# Patient Record
Sex: Female | Born: 1940 | Race: Black or African American | Hispanic: No | Marital: Married | Smoking: Never smoker
Health system: Southern US, Community
[De-identification: ages and names within clinical notes are randomized; demographics above are authoritative.]

## PROBLEM LIST (undated history)

## (undated) DIAGNOSIS — F419 Anxiety disorder, unspecified: Secondary | ICD-10-CM

## (undated) DIAGNOSIS — T7840XA Allergy, unspecified, initial encounter: Secondary | ICD-10-CM

## (undated) DIAGNOSIS — K589 Irritable bowel syndrome without diarrhea: Secondary | ICD-10-CM

## (undated) DIAGNOSIS — N390 Urinary tract infection, site not specified: Secondary | ICD-10-CM

## (undated) DIAGNOSIS — J45909 Unspecified asthma, uncomplicated: Secondary | ICD-10-CM

## (undated) DIAGNOSIS — K219 Gastro-esophageal reflux disease without esophagitis: Secondary | ICD-10-CM

## (undated) DIAGNOSIS — Z8619 Personal history of other infectious and parasitic diseases: Secondary | ICD-10-CM

## (undated) DIAGNOSIS — G43909 Migraine, unspecified, not intractable, without status migrainosus: Secondary | ICD-10-CM

## (undated) HISTORY — DX: Personal history of other infectious and parasitic diseases: Z86.19

## (undated) HISTORY — PX: ABDOMINAL HYSTERECTOMY: SHX81

## (undated) HISTORY — DX: Irritable bowel syndrome, unspecified: K58.9

## (undated) HISTORY — DX: Gastro-esophageal reflux disease without esophagitis: K21.9

## (undated) HISTORY — DX: Allergy, unspecified, initial encounter: T78.40XA

## (undated) HISTORY — DX: Migraine, unspecified, not intractable, without status migrainosus: G43.909

## (undated) HISTORY — DX: Urinary tract infection, site not specified: N39.0

## (undated) HISTORY — DX: Unspecified asthma, uncomplicated: J45.909

## (undated) HISTORY — PX: CATARACT EXTRACTION, BILATERAL: SHX1313

## (undated) HISTORY — PX: TUBAL LIGATION: SHX77

---

## 2006-01-25 ENCOUNTER — Ambulatory Visit: Payer: Self-pay | Admitting: Gastroenterology

## 2006-01-31 ENCOUNTER — Ambulatory Visit: Payer: Self-pay | Admitting: Family Medicine

## 2006-02-07 ENCOUNTER — Ambulatory Visit: Payer: Self-pay | Admitting: Gastroenterology

## 2006-12-24 ENCOUNTER — Emergency Department: Payer: Self-pay | Admitting: Emergency Medicine

## 2006-12-25 ENCOUNTER — Ambulatory Visit: Payer: Self-pay | Admitting: Family Medicine

## 2007-02-05 ENCOUNTER — Ambulatory Visit: Payer: Self-pay | Admitting: Family Medicine

## 2007-03-11 ENCOUNTER — Ambulatory Visit: Payer: Self-pay | Admitting: Gastroenterology

## 2007-03-18 ENCOUNTER — Ambulatory Visit: Payer: Self-pay | Admitting: Gastroenterology

## 2007-03-26 ENCOUNTER — Ambulatory Visit: Payer: Self-pay | Admitting: Gastroenterology

## 2007-06-28 ENCOUNTER — Ambulatory Visit: Payer: Self-pay | Admitting: Obstetrics and Gynecology

## 2007-06-28 ENCOUNTER — Other Ambulatory Visit: Payer: Self-pay

## 2007-07-01 ENCOUNTER — Inpatient Hospital Stay: Payer: Self-pay | Admitting: Obstetrics and Gynecology

## 2008-09-21 ENCOUNTER — Encounter: Admission: RE | Admit: 2008-09-21 | Discharge: 2008-12-20 | Payer: Self-pay | Admitting: Family Medicine

## 2008-10-06 ENCOUNTER — Ambulatory Visit: Payer: Self-pay | Admitting: Ophthalmology

## 2008-10-19 ENCOUNTER — Ambulatory Visit: Payer: Self-pay | Admitting: Ophthalmology

## 2009-02-08 ENCOUNTER — Ambulatory Visit: Payer: Self-pay | Admitting: Family Medicine

## 2009-08-04 ENCOUNTER — Ambulatory Visit: Payer: Self-pay | Admitting: Cardiology

## 2009-08-05 ENCOUNTER — Ambulatory Visit: Payer: Self-pay | Admitting: Cardiology

## 2010-11-10 ENCOUNTER — Emergency Department: Payer: Self-pay | Admitting: Emergency Medicine

## 2011-01-16 ENCOUNTER — Ambulatory Visit: Payer: Self-pay | Admitting: Family Medicine

## 2011-11-29 ENCOUNTER — Ambulatory Visit: Payer: Self-pay | Admitting: Ophthalmology

## 2011-12-11 ENCOUNTER — Ambulatory Visit: Payer: Self-pay | Admitting: Ophthalmology

## 2012-02-28 ENCOUNTER — Ambulatory Visit: Payer: Self-pay | Admitting: Family Medicine

## 2013-03-10 ENCOUNTER — Ambulatory Visit: Payer: Self-pay | Admitting: Family Medicine

## 2014-03-30 ENCOUNTER — Ambulatory Visit: Payer: Self-pay | Admitting: Family Medicine

## 2014-05-12 ENCOUNTER — Ambulatory Visit: Payer: Self-pay | Admitting: General Surgery

## 2014-08-10 ENCOUNTER — Ambulatory Visit: Payer: Self-pay | Admitting: Unknown Physician Specialty

## 2014-08-13 ENCOUNTER — Ambulatory Visit (INDEPENDENT_AMBULATORY_CARE_PROVIDER_SITE_OTHER): Payer: Medicare Other | Admitting: Family Medicine

## 2014-08-13 ENCOUNTER — Encounter: Payer: Self-pay | Admitting: Family Medicine

## 2014-08-13 VITALS — BP 132/76 | HR 58 | Temp 98.4°F | Ht 59.0 in | Wt 125.0 lb

## 2014-08-13 DIAGNOSIS — J45909 Unspecified asthma, uncomplicated: Secondary | ICD-10-CM | POA: Insufficient documentation

## 2014-08-13 DIAGNOSIS — Z800 Family history of malignant neoplasm of digestive organs: Secondary | ICD-10-CM

## 2014-08-13 DIAGNOSIS — J4530 Mild persistent asthma, uncomplicated: Secondary | ICD-10-CM

## 2014-08-13 DIAGNOSIS — Z8049 Family history of malignant neoplasm of other genital organs: Secondary | ICD-10-CM

## 2014-08-13 DIAGNOSIS — K589 Irritable bowel syndrome without diarrhea: Secondary | ICD-10-CM

## 2014-08-13 DIAGNOSIS — J302 Other seasonal allergic rhinitis: Secondary | ICD-10-CM | POA: Insufficient documentation

## 2014-08-13 DIAGNOSIS — R490 Dysphonia: Secondary | ICD-10-CM

## 2014-08-13 DIAGNOSIS — Z803 Family history of malignant neoplasm of breast: Secondary | ICD-10-CM

## 2014-08-13 DIAGNOSIS — Z8669 Personal history of other diseases of the nervous system and sense organs: Secondary | ICD-10-CM

## 2014-08-13 DIAGNOSIS — K581 Irritable bowel syndrome with constipation: Secondary | ICD-10-CM

## 2014-08-13 DIAGNOSIS — K219 Gastro-esophageal reflux disease without esophagitis: Secondary | ICD-10-CM

## 2014-08-13 NOTE — Assessment & Plan Note (Signed)
Likely cause of post nasal drip. Consider trial of nasal steroid.

## 2014-08-13 NOTE — Patient Instructions (Addendum)
Schedule medicare wellness with fasting labs prior in 3-4 weeks. Complete prilosec  40 mg daily as instructed by ENT. Avoid reflux triggers. Try to increase fiber in diet such metamucil. INcrease water in diet.   Consi Der trying flonase OTC 2 sprays per nostril daily.

## 2014-08-13 NOTE — Progress Notes (Signed)
Pre visit review using our clinic review tool, if applicable. No additional management support is needed unless otherwise documented below in the visit note. 

## 2014-08-13 NOTE — Progress Notes (Signed)
   Subjective:    Patient ID: Jessica Hardin, female    DOB: 1940-11-08, 74 y.o.   MRN: [id number]  HPI 74 year old female presents to establish care. She was born in [place] She has previously seeing Dr. Lovie Macadamia. Last OV 09/2013. CPX. Last mammo 01/2014 Colonoscopy: nml 2008  Dr. Tiffany Kocher.  Since then saw Dr. Delano Metz, [facility] ? Sigmoidoscopy, nml, last 3 years ago. Due for  Vaccines.  GERD: moderate control on prilosec 40 mg daily x 10 days Had nml endoscopy  2008 as well.   IBS constipation predominant. She is treating with pulverized cholrella ? Probipotic. Bm firm pellets lately.  Drinks a lot of water. Stomach is sensitive to fiber. Using cumin, tumeric, avoiding gluten.   BP Readings from Last 3 Encounters:  08/13/14 132/76   She has had some recent issues with  Phlegm in throat and hoarseness in last few months.  Has constant post nasal drip in last 4-5 weeks. Coughing  fits dry, some shortness of breath at that time. Hx of childhood asthma. Seen at [facility] on 1/29.. Treat Augmentin x 10 days. Coughing now improved but still with post nasal drip.  Saw ENT 3 days ago..saw narrowing in throat.Durene Cal for CT scan of neck.Marland Kitchen Awaiting the results. Using nasal saline irrigation. Has not tried flonase or nasonex.  Review of Systems  Constitutional: Negative for fever and fatigue.  HENT: Positive for postnasal drip and voice change. Negative for ear pain, sinus pressure and trouble swallowing.   Eyes: Negative for pain.  Respiratory: Negative for chest tightness and shortness of breath.   Cardiovascular: Negative for chest pain, palpitations and leg swelling.  Gastrointestinal: Negative for abdominal pain.  Genitourinary: Negative for dysuria.       Objective:   Physical Exam  Constitutional: Vital signs are normal. She appears well-developed and well-nourished. She is cooperative.  Non-toxic appearance. She does not appear ill. No distress.  HENT:    Head: Normocephalic.  Right Ear: Hearing, tympanic membrane, external ear and ear canal normal.  Left Ear: Hearing, tympanic membrane, external ear and ear canal normal.  Nose: Mucosal edema present. No rhinorrhea.  Eyes: Conjunctivae, EOM and lids are normal. Pupils are equal, round, and reactive to light. Lids are everted and swept, no foreign bodies found.  Neck: Trachea normal and normal range of motion. Neck supple. Carotid bruit is not present. No thyroid mass and no thyromegaly present.  Cardiovascular: Normal rate, regular rhythm, S1 normal, S2 normal, normal heart sounds and intact distal pulses.  Exam reveals no gallop.   No murmur heard. Pulmonary/Chest: Effort normal and breath sounds normal. No respiratory distress. She has no wheezes. She has no rhonchi. She has no rales.  Abdominal: Soft. Normal appearance and bowel sounds are normal. She exhibits no distension, no fluid wave, no abdominal bruit and no mass. There is no hepatosplenomegaly. There is no tenderness. There is no rebound, no guarding and no CVA tenderness. No hernia.  Lymphadenopathy:    She has no cervical adenopathy.    She has no axillary adenopathy.  Neurological: She is alert. She has normal strength. No cranial nerve deficit or sensory deficit.  Skin: Skin is warm, dry and intact. No rash noted.  Psychiatric: Her speech is normal and behavior is normal. Judgment normal. Her mood appears not anxious. Cognition and memory are normal. She does not exhibit a depressed mood.          Assessment & Plan:

## 2014-08-13 NOTE — Assessment & Plan Note (Signed)
In eval by ENT. Will await CT neck.  May also be due to GERD.

## 2014-08-13 NOTE — Assessment & Plan Note (Signed)
Improved with 40 mg daily of prilosec.

## 2014-09-01 ENCOUNTER — Other Ambulatory Visit (INDEPENDENT_AMBULATORY_CARE_PROVIDER_SITE_OTHER): Payer: Medicare Other

## 2014-09-01 ENCOUNTER — Telehealth: Payer: Self-pay | Admitting: Family Medicine

## 2014-09-01 DIAGNOSIS — Z13220 Encounter for screening for lipoid disorders: Secondary | ICD-10-CM

## 2014-09-01 LAB — COMPREHENSIVE METABOLIC PANEL
ALBUMIN: 4 g/dL (ref 3.5–5.2)
ALK PHOS: 73 U/L (ref 39–117)
ALT: 20 U/L (ref 0–35)
AST: 24 U/L (ref 0–37)
BUN: 14 mg/dL (ref 6–23)
CALCIUM: 9.9 mg/dL (ref 8.4–10.5)
CHLORIDE: 103 meq/L (ref 96–112)
CO2: 35 mEq/L — ABNORMAL HIGH (ref 19–32)
Creatinine, Ser: 0.91 mg/dL (ref 0.40–1.20)
GFR: 77.51 mL/min (ref >60.00)
Glucose, Bld: 82 mg/dL (ref 70–99)
POTASSIUM: 4 meq/L (ref 3.5–5.1)
SODIUM: 141 meq/L (ref 135–145)
TOTAL PROTEIN: 7.2 g/dL (ref 6.0–8.3)
Total Bilirubin: 0.4 mg/dL (ref 0.2–1.2)

## 2014-09-01 LAB — LIPID PANEL
CHOLESTEROL: 181 mg/dL (ref 0–200)
HDL: 71.5 mg/dL (ref >39.00)
LDL Cholesterol: 98 mg/dL (ref 0–99)
NonHDL: 109.5
Total CHOL/HDL Ratio: 3
Triglycerides: 56 mg/dL (ref 0.0–149.0)
VLDL: 11.2 mg/dL (ref 0.0–40.0)

## 2014-09-01 NOTE — Telephone Encounter (Signed)
-----   Message from Ellamae Sia sent at 08/26/2014  4:24 PM EST ----- Regarding: Lab orders for Tuesday, 3.15.16 Patient is scheduled for CPX labs, please order future labs, Thanks , Karna Christmas

## 2014-09-08 ENCOUNTER — Ambulatory Visit (INDEPENDENT_AMBULATORY_CARE_PROVIDER_SITE_OTHER): Payer: Medicare Other | Admitting: Family Medicine

## 2014-09-08 ENCOUNTER — Encounter: Payer: Self-pay | Admitting: Family Medicine

## 2014-09-08 VITALS — BP 116/80 | HR 68 | Temp 98.4°F | Ht 59.0 in | Wt 124.5 lb

## 2014-09-08 DIAGNOSIS — Z7189 Other specified counseling: Secondary | ICD-10-CM

## 2014-09-08 DIAGNOSIS — Z0000 Encounter for general adult medical examination without abnormal findings: Secondary | ICD-10-CM | POA: Diagnosis not present

## 2014-09-08 DIAGNOSIS — K219 Gastro-esophageal reflux disease without esophagitis: Secondary | ICD-10-CM

## 2014-09-08 DIAGNOSIS — R490 Dysphonia: Secondary | ICD-10-CM

## 2014-09-08 NOTE — Assessment & Plan Note (Signed)
Stable control on prilosec 40 mg daily, may need to change to nexium or pantoprazole if hoarseness not improving

## 2014-09-08 NOTE — Addendum Note (Signed)
Addended by: Eliezer Lofts E on: 09/08/2014 02:31 PM   Modules accepted: Level of Service, SmartSet

## 2014-09-08 NOTE — Patient Instructions (Addendum)
Continue flonase. Can also try (ceterizine) zyrtec at night for allergies that may be causing mucus in throat. Remove allergens. Continue omeprazole for now. Consider #1 pneumonia vaccine, shingles and tetanus. Consider bone density when doing mammogram next year.

## 2014-09-08 NOTE — Progress Notes (Signed)
Subjective:    Patient ID: Jessica Hardin, female    DOB: [birth year], 74 y.o.   MRN: [id number]  HPI  I have personally reviewed the Medicare Annual Wellness questionnaire and have noted 1. The patient's medical and social history 2. Their use of alcohol, tobacco or illicit drugs 3. Their current medications and supplements 4. The patient's functional ability including ADL's, fall risks, home safety risks and hearing or visual             impairment. 5. Diet and physical activities 6. Evidence for depression or mood disorders The patients weight, height, BMI and visual acuity have been recorded in the chart I have made referrals, counseling and provided education to the patient based review of the above and I have provided the pt with a written personalized care plan for preventive services.  Hoarseness, phlegm in thoart:  CT neck was normal. On flonase 2 sprays per nostril daily. On 3-4 days. She does have air freshener in house she thinks may be contributing.  GERD:on prilosec 40 mg daily on x 4 weeks. No improvement in hoarseness, but no longer having cough.  She thinks she may have fallen once in last year.    Review of Systems  Constitutional: Negative for fever, fatigue and unexpected weight change.  HENT: Negative for congestion, ear pain, sinus pressure, sneezing, sore throat and trouble swallowing.   Eyes: Negative for pain and itching.  Respiratory: Negative for cough, shortness of breath and wheezing.   Cardiovascular: Negative for chest pain, palpitations and leg swelling.  Gastrointestinal: Positive for constipation. Negative for nausea, abdominal pain, diarrhea and blood in stool.  Genitourinary: Negative for dysuria, hematuria and difficulty urinating.  Skin: Negative for rash.  Neurological: Positive for dizziness. Negative for syncope, weakness, numbness and headaches.  Psychiatric/Behavioral: Negative for confusion and dysphoric mood. The patient is not  nervous/anxious.        Objective:   Physical Exam  Constitutional: Vital signs are normal. She appears well-developed and well-nourished. She is cooperative.  Non-toxic appearance. She does not appear ill. No distress.  HENT:  Head: Normocephalic.  Right Ear: Hearing, tympanic membrane, external ear and ear canal normal.  Left Ear: Hearing, tympanic membrane, external ear and ear canal normal.  Nose: Nose normal.  Eyes: Conjunctivae, EOM and lids are normal. Pupils are equal, round, and reactive to light. Lids are everted and swept, no foreign bodies found.  Neck: Trachea normal and normal range of motion. Neck supple. Carotid bruit is not present. No thyroid mass and no thyromegaly present.  Cardiovascular: Normal rate, regular rhythm, S1 normal, S2 normal, normal heart sounds and intact distal pulses.  Exam reveals no gallop.   No murmur heard. Pulmonary/Chest: Effort normal and breath sounds normal. No respiratory distress. She has no wheezes. She has no rhonchi. She has no rales.  Abdominal: Soft. Normal appearance and bowel sounds are normal. She exhibits no distension, no fluid wave, no abdominal bruit and no mass. There is no hepatosplenomegaly. There is no tenderness. There is no rebound, no guarding and no CVA tenderness. No hernia.  Genitourinary: No breast swelling, tenderness, discharge or bleeding. Pelvic exam was performed with patient supine.  Lymphadenopathy:    She has no cervical adenopathy.    She has no axillary adenopathy.  Neurological: She is alert. She has normal strength. No cranial nerve deficit or sensory deficit.  Skin: Skin is warm, dry and intact. No rash noted.  Psychiatric: Her speech is normal and behavior  is normal. Judgment normal. Her mood appears not anxious. Cognition and memory are normal. She does not exhibit a depressed mood.          Assessment & Plan:  The patient's preventative maintenance and recommended screening tests for an annual  wellness exam were reviewed in full today. Brought up to date unless services declined.  Counselled on the importance of diet, exercise, and its role in overall health and mortality. The patient's FH and SH was reviewed, including their home life, tobacco status, and drug and alcohol status.   She has previously seeing Dr. Lovie Macadamia. Last OV 09/2013. CPX. Last mammo 01/2014 Colonoscopy: nml 2008 Dr. Tiffany Kocher. Since then saw Dr. Delano Metz, [facility] ? Sigmoidoscopy, nml, last 3 years ago.  Vaccines. Due flu, shingles,PNA, tdap PAP/DVE: not indicated DXA: Due. Nonsmoker

## 2014-09-08 NOTE — Progress Notes (Signed)
Pre visit review using our clinic review tool, if applicable. No additional management support is needed unless otherwise documented below in the visit note. 

## 2014-09-08 NOTE — Assessment & Plan Note (Signed)
?   Most likely due to allergies or GERD. Per pt feels GERD is well treated. Will add cetrizine or zyrtec to treat in addition to flonase.  Has had ENT eval.

## 2014-10-11 NOTE — Op Note (Signed)
PATIENT NAME:  Jessica Hardin, Jessica Hardin MR#:  [id number] DATE OF BIRTH:  07/20/1940  DATE OF PROCEDURE:  12/11/2011  PREOPERATIVE DIAGNOSIS:  Cataract, left eye.    POSTOPERATIVE DIAGNOSIS:  Cataract, left eye.  PROCEDURE PERFORMED:  Extracapsular cataract extraction using phacoemulsification with placement of an Alcon [id number], 20.5-diopter posterior chamber lens, serial #[id number].  SURGEON:  Loura Back. Charyl Minervini, MD  ASSISTANT:  None.  ANESTHESIA:  4% lidocaine and 0.75% Marcaine in a 50/50 mixture with 10 units/mL of Hylenex added, given as a peribulbar.  ANESTHESIOLOGIST:  Elyse Hsu, MD   COMPLICATIONS:  None.  ESTIMATED BLOOD LOSS:  Less than 1 mL.  DESCRIPTION OF PROCEDURE:  The patient was brought to the operating room and given a peribulbar block.  The patient was then prepped and draped in the usual fashion.  The vertical rectus muscles were imbricated using 5-0 silk sutures.  These sutures were then clamped to the sterile drapes as bridle sutures.  A limbal peritomy was performed extending two clock hours and hemostasis was obtained with cautery.  A partial thickness scleral groove was made at the surgical limbus and dissected anteriorly in a lamellar dissection using an Alcon crescent knife.  The anterior chamber was entered supero-temporally with a Superblade and through the lamellar dissection with a 2.6 mm keratome.  DisCoVisc was used to replace the aqueous and a continuous tear capsulorrhexis was carried out.  Hydrodissection and hydrodelineation were carried out with balanced salt and a 27 gauge canula.  The nucleus was rotated to confirm the effectiveness of the hydrodissection.  Phacoemulsification was carried out using a divide-and-conquer technique.  Total ultrasound time was 1 minute and 9.3 seconds, with an average power of 16.7 percent, CDE of 19.98.  Irrigation/aspiration was used to remove the residual cortex.  DisCoVisc was used to inflate the capsule and the internal  incision was enlarged to 3 mm with the crescent knife.  The intraocular lens was folded and inserted into the capsular bag using the AcrySert Delivery System.   Irrigation/aspiration was used to remove the residual DisCoVisc.  Miostat was injected into the anterior chamber through the paracentesis track to inflate the anterior chamber and induce miosis.  The wound was checked for leaks and none were found. The conjunctiva was closed with cautery and the bridle sutures were removed.  Two drops of 0.3% Vigamox were placed on the eye.   An eye shield was placed on the eye.  The patient was discharged to the recovery room in good condition.   ____________________________ Loura Back Terren Jandreau, MD sad:cbb D: 12/11/2011 14:28:27 ET T: 12/11/2011 15:17:29 ET JOB#: [id number]  cc: Remo Lipps A. Abriel Hattery, MD, <Dictator> Martie Lee MD ELECTRONICALLY SIGNED 12/27/2011 11:56

## 2014-10-12 ENCOUNTER — Telehealth: Payer: Self-pay

## 2014-10-12 NOTE — Telephone Encounter (Signed)
Pt was seen 09/08/14, hoarseness is no better and pt needs refills on omeprazole and levocetirizine dihydrochloride 5 mg. Or if hoarseness was not better Dr Diona Browner said might change meds to different meds. [facility] [address]. Pt request cb. Pt said it is OK with her to try new meds but pt does not want to try med with a lot of potential side effects.

## 2014-10-13 NOTE — Telephone Encounter (Signed)
Change omeprazole to pantoprazole 40 mg daily. Have her call me ifhoarseness not better in 2 weeks. Okay to refill levoceterizine.

## 2014-10-14 MED ORDER — PANTOPRAZOLE SODIUM 40 MG PO TBEC: 40.0000 mg | DELAYED_RELEASE_TABLET | Freq: Every day | ORAL | Status: DC

## 2014-10-14 MED ORDER — LEVOCETIRIZINE DIHYDROCHLORIDE 5 MG PO TABS: 5.0000 mg | ORAL_TABLET | Freq: Every evening | ORAL | Status: DC

## 2014-10-14 NOTE — Telephone Encounter (Signed)
Left message for Jessica Hardin that Dr. Diona Browner wants her to stop omeprazole and start taking pantoprazole 40 mg daily.  Refills also sent in for her levocetirizine to her pharmacy.  Advised to call us back if her hoarseness is not any better in 2 weeks.

## 2015-01-05 ENCOUNTER — Ambulatory Visit (INDEPENDENT_AMBULATORY_CARE_PROVIDER_SITE_OTHER): Payer: Medicare Other | Admitting: Family Medicine

## 2015-01-05 ENCOUNTER — Encounter: Payer: Self-pay | Admitting: Family Medicine

## 2015-01-05 VITALS — BP 110/70 | HR 60 | Temp 98.4°F | Ht 59.0 in | Wt 124.2 lb

## 2015-01-05 DIAGNOSIS — M2660 Temporomandibular joint disorder, unspecified: Secondary | ICD-10-CM | POA: Diagnosis not present

## 2015-01-05 DIAGNOSIS — M6740 Ganglion, unspecified site: Secondary | ICD-10-CM

## 2015-01-05 DIAGNOSIS — M26609 Unspecified temporomandibular joint disorder, unspecified side: Secondary | ICD-10-CM | POA: Insufficient documentation

## 2015-01-05 NOTE — Assessment & Plan Note (Signed)
INfo provided. Caher no apin or dysfuction. She will monitor and consider referral to hand specialist if bothering her more or increasing in size.

## 2015-01-05 NOTE — Progress Notes (Signed)
74 year old female with history of GERD, allergies and IBS presents with new onset mass in last year on right dorsal wrist. Size of marble. 6 months ago noted very small additional area medial to lump.  Getting bigger gradually.  Not tender unless moving hand in certain ways.  Not interfering with use of hands.  No numbness, no weakness in hand.  No redness, no warmth.  No discharge.  Left jaw hears clicking off and on in last few months. Sore with opening mouth. Feels like jaw not closing like it is supposed to.  She grinds her teeth at day and night.  Has not seen dentist in a while.  As an aside pt notes memory issue. She feels this may be due to stress.  Review of Systems  Constitutional: Negative for fever, chills and fatigue.  HENT: Negative for ear pain.  Eyes: Negative for pain.   Objective:   Physical Exam  Constitutional: Vital signs are normal. She appears well-developed and well-nourished. She is cooperative. Non-toxic appearance. She does not appear ill. No distress.  HENT:  Head: Normocephalic.  Right Ear: Hearing, tympanic membrane, external ear and ear canal normal. Tympanic membrane is not erythematous, not retracted and not bulging.  Left Ear: Hearing, tympanic membrane, external ear and ear canal normal. Tympanic membrane is not erythematous, not retracted and not bulging.  Nose: No mucosal edema or rhinorrhea. Right sinus exhibits no maxillary sinus tenderness and no frontal sinus tenderness. Left sinus exhibits no maxillary sinus tenderness and no frontal sinus tenderness.  Mouth/Throat: Uvula is midline, oropharynx is clear and moist and mucous membranes are normal.  Popping with opening mouth wide, mild soreness in left TMJ  Eyes: Conjunctivae, EOM and lids are normal. Pupils are equal, round, and reactive to light. Lids are everted and swept, no foreign bodies found.  Neck: Trachea normal and normal range of motion. Neck supple. Carotid bruit is not present. No  thyroid mass and no thyromegaly present.  Cardiovascular: Normal rate, regular rhythm, S1 normal, S2 normal, normal heart sounds, intact distal pulses and normal pulses. Exam reveals no gallop and no friction rub.  No murmur heard.  Pulmonary/Chest: Effort normal and breath sounds normal. No tachypnea. No respiratory distress. She has no decreased breath sounds. She has no wheezes. She has no rhonchi. She has no rales.  Abdominal: Soft. Normal appearance and bowel sounds are normal. There is no tenderness.  Musculoskeletal:  marble size mobile mass in right posterior wrist consistent with ganglion cyst.  Neurological: She is alert.  Skin: Skin is warm, dry and intact. No rash noted.  Psychiatric: Her speech is normal and behavior is normal. Judgment and thought content normal. Her mood appears not anxious. Cognition and memory are normal. She does not exhibit a depressed mood.   Assessment & Plan:

## 2015-01-05 NOTE — Progress Notes (Signed)
Pre visit review using our clinic review tool, if applicable. No additional management support is needed unless otherwise documented below in the visit note. 

## 2015-01-05 NOTE — Patient Instructions (Addendum)
Call if  ganglion cyst bothering you , or becomes red, hot, painful.. for referral to [facility] surgeon for removal.  See dentist about evaluation of grinding of teeth.  Avoid chewy foods, gum, and opening mouth really wide.  Schedule appt in next months  for memory evaluation if remaining concerned.   Ganglion Cyst A ganglion cyst is a noncancerous, fluid-filled lump that occurs near joints or tendons. The ganglion cyst grows out of a joint or the lining of a tendon. It most often develops in the hand or wrist but can also develop in the shoulder, elbow, hip, knee, ankle, or foot. The round or oval ganglion can be pea sized or larger than a grape. Increased activity may enlarge the size of the cyst because more fluid starts to build up.  CAUSES  It is not completely known what causes a ganglion cyst to grow. However, it may be related to:  Inflammation or irritation around the joint.  An injury.  Repetitive movements or overuse.  Arthritis. SYMPTOMS  A lump most often appears in the hand or wrist, but can occur in other areas of the body. Generally, the lump is painless without other symptoms. However, sometimes pain can be felt during activity or when pressure is applied to the lump. The lump may even be tender to the touch. Tingling, pain, numbness, or muscle weakness can occur if the ganglion cyst presses on a nerve. Your grip may be weak and you may have less movement in your joints.  DIAGNOSIS  Ganglion cysts are most often diagnosed based on a physical exam, noting where the cyst is and how it looks. Your caregiver will feel the lump and may shine a light alongside it. If it is a ganglion, a light often shines through it. Your caregiver may order an X-ray, ultrasound, or MRI to rule out other conditions. TREATMENT  Ganglions usually go away on their own without treatment. If pain or other symptoms are involved, treatment may be needed. Treatment is also needed if the ganglion limits your  movement or if it gets infected. Treatment options include:  Wearing a wrist or finger brace or splint.  Taking anti-inflammatory medicine.  Draining fluid from the lump with a needle (aspiration).  Injecting a steroid into the joint.  Surgery to remove the ganglion cyst and its stalk that is attached to the joint or tendon. However, ganglion cysts can grow back. HOME CARE INSTRUCTIONS   Do not press on the ganglion, poke it with a needle, or hit it with a heavy object. You may rub the lump gently and often. Sometimes fluid moves out of the cyst.  Only take medicines as directed by your caregiver.  Wear your brace or splint as directed by your caregiver. SEEK MEDICAL CARE IF:   Your ganglion becomes larger or more painful.  You have increased redness, red streaks, or swelling.  You have pus coming from the lump.  You have weakness or numbness in the affected area. MAKE SURE YOU:   Understand these instructions.  Will watch your condition.  Will get help right away if you are not doing well or get worse. Document Released: 06/02/2000 Document Revised: 02/28/2012 Document Reviewed: 07/30/2007 [facility] Patient Information 2015 [place], [place]. This information is not intended to replace advice given to you by your health care provider. Make sure you discuss any questions you have with your health care provider.   Temporomandibular Problems  Temporomandibular joint (TMJ) dysfunction means there are problems with the joint  between your jaw and your skull. This is a joint lined by cartilage like other joints in your body but also has a small disc in the joint which keeps the bones from rubbing on each other. These joints are like other joints and can get inflamed (sore) from arthritis and other problems. When this joint gets sore, it can cause headaches and pain in the jaw and the face. CAUSES  Usually the arthritic types of problems are caused by soreness in the joint. Soreness in  the joint can also be caused by overuse. This may come from grinding your teeth. It may also come from mis-alignment in the joint. DIAGNOSIS Diagnosis of this condition can often be made by history and exam. Sometimes your caregiver may need X-rays or an MRI scan to determine the exact cause. It may be necessary to see your dentist to determine if your teeth and jaws are lined up correctly. TREATMENT  Most of the time this problem is not serious; however, sometimes it can persist (become chronic). When this happens medications that will cut down on inflammation (soreness) help. Sometimes a shot of cortisone into the joint will be helpful. If your teeth are not aligned it may help for your dentist to make a splint for your mouth that can help this problem. If no physical problems can be found, the problem may come from tension. If tension is found to be the cause, biofeedback or relaxation techniques may be helpful. HOME CARE INSTRUCTIONS   Later in the day, applications of ice packs may be helpful. Ice can be used in a plastic bag with a towel around it to prevent frostbite to skin. This may be used about every 2 hours for 20 to 30 minutes, as needed while awake, or as directed by your caregiver.  Only take over-the-counter or prescription medicines for pain, discomfort, or fever as directed by your caregiver.  If physical therapy was prescribed, follow your caregiver's directions.  Wear mouth appliances as directed if they were given. Document Released: 02/28/2001 Document Revised: 08/28/2011 Document Reviewed: 06/07/2008 [facility] Patient Information 2015 [place], [place]. This information is not intended to replace advice given to you by your health care provider. Make sure you discuss any questions you have with your health care provider.

## 2015-01-05 NOTE — Progress Notes (Signed)
   Subjective:    Patient ID: Jessica Hardin, female    DOB: 1940-10-13, 74 y.o.   MRN: [id number]  HPI   74 year old female with history of GERD, allergies and IBS presents with new onset mass in last year on right dorsal wrist. Size of marble. 6 months ago noted very small additional area medial to lump.   Getting bigger gradually.  Not tender unless moving hand in certain ways.  Not interfering with use of hands.  No numbness, no weakness in hand.  No redness, no warmth.   No discharge.  Left jaw hears clicking off and on in last few months. Sore with opening mouth.  Feels like jaw not closing like it is supposed to.  She grinds her teeth at day and night. Has not seen dentist in a while.    As an aside pt notes memory issue. She feels this may be due to stress. Review of Systems  Constitutional: Negative for fever, chills and fatigue.  HENT: Negative for ear pain.   Eyes: Negative for pain.       Objective:   Physical Exam  Constitutional: Vital signs are normal. She appears well-developed and well-nourished. She is cooperative.  Non-toxic appearance. She does not appear ill. No distress.  HENT:  Head: Normocephalic.  Right Ear: Hearing, tympanic membrane, external ear and ear canal normal. Tympanic membrane is not erythematous, not retracted and not bulging.  Left Ear: Hearing, tympanic membrane, external ear and ear canal normal. Tympanic membrane is not erythematous, not retracted and not bulging.  Nose: No mucosal edema or rhinorrhea. Right sinus exhibits no maxillary sinus tenderness and no frontal sinus tenderness. Left sinus exhibits no maxillary sinus tenderness and no frontal sinus tenderness.  Mouth/Throat: Uvula is midline, oropharynx is clear and moist and mucous membranes are normal.  Popping with opening mouth wide, mild soreness in left TMJ  Eyes: Conjunctivae, EOM and lids are normal. Pupils are equal, round, and reactive to light. Lids are everted and  swept, no foreign bodies found.  Neck: Trachea normal and normal range of motion. Neck supple. Carotid bruit is not present. No thyroid mass and no thyromegaly present.  Cardiovascular: Normal rate, regular rhythm, S1 normal, S2 normal, normal heart sounds, intact distal pulses and normal pulses.  Exam reveals no gallop and no friction rub.   No murmur heard. Pulmonary/Chest: Effort normal and breath sounds normal. No tachypnea. No respiratory distress. She has no decreased breath sounds. She has no wheezes. She has no rhonchi. She has no rales.  Abdominal: Soft. Normal appearance and bowel sounds are normal. There is no tenderness.  Musculoskeletal:  marble size mobile mass in right posterior wrist consistent with ganglion cyst.  Neurological: She is alert.  Skin: Skin is warm, dry and intact. No rash noted.  Psychiatric: Her speech is normal and behavior is normal. Judgment and thought content normal. Her mood appears not anxious. Cognition and memory are normal. She does not exhibit a depressed mood.          Assessment & Plan:

## 2015-02-09 ENCOUNTER — Ambulatory Visit (INDEPENDENT_AMBULATORY_CARE_PROVIDER_SITE_OTHER): Payer: Medicare Other | Admitting: Family Medicine

## 2015-02-09 ENCOUNTER — Encounter: Payer: Self-pay | Admitting: Family Medicine

## 2015-02-09 VITALS — BP 100/60 | HR 59 | Temp 98.2°F | Ht 59.0 in | Wt 124.8 lb

## 2015-02-09 DIAGNOSIS — R413 Other amnesia: Secondary | ICD-10-CM | POA: Diagnosis not present

## 2015-02-09 DIAGNOSIS — R4189 Other symptoms and signs involving cognitive functions and awareness: Secondary | ICD-10-CM | POA: Insufficient documentation

## 2015-02-09 LAB — CBC WITH DIFFERENTIAL/PLATELET
BASOS ABS: 0 10*3/uL (ref 0.0–0.1)
Basophils Relative: 0.5 % (ref 0.0–3.0)
EOS ABS: 0.1 10*3/uL (ref 0.0–0.7)
Eosinophils Relative: 1.4 % (ref 0.0–5.0)
HCT: 40.1 % (ref 36.0–46.0)
HEMOGLOBIN: 12.8 g/dL (ref 12.0–15.0)
Lymphocytes Relative: 48.4 % — ABNORMAL HIGH (ref 12.0–46.0)
Lymphs Abs: 2.1 10*3/uL (ref 0.7–4.0)
MCHC: 32 g/dL (ref 30.0–36.0)
MCV: 90.6 fl (ref 78.0–100.0)
MONO ABS: 0.3 10*3/uL (ref 0.1–1.0)
Monocytes Relative: 7 % (ref 3.0–12.0)
Neutro Abs: 1.9 10*3/uL (ref 1.4–7.7)
Neutrophils Relative %: 42.7 % — ABNORMAL LOW (ref 43.0–77.0)
Platelets: 199 10*3/uL (ref 150.0–400.0)
RBC: 4.43 Mil/uL (ref 3.87–5.11)
RDW: 14.1 % (ref 11.5–15.5)
WBC: 4.4 10*3/uL (ref 4.0–10.5)

## 2015-02-09 LAB — VITAMIN B12: VITAMIN B 12: 772 pg/mL (ref 211–911)

## 2015-02-09 LAB — TSH: TSH: 0.66 u[IU]/mL (ref 0.35–4.50)

## 2015-02-09 NOTE — Progress Notes (Signed)
Pre visit review using our clinic review tool, if applicable. No additional management support is needed unless otherwise documented below in the visit note. 

## 2015-02-09 NOTE — Patient Instructions (Signed)
Stop at lab on way out. Start exercise and memory games etc.

## 2015-02-09 NOTE — Progress Notes (Signed)
   Subjective:    Patient ID: Jessica Hardin, female    DOB: 1941-05-25, 74 y.o.   MRN: [id number]  HPI  74 year old female presents for 1 month follow up on memory.  She states she has noted she has decrease short memory. Cannot find things she puts away. Trouble remembering things in conversation, forgetting appointments.  Memory gradual  decline in last few years.   No family members have noted issues per pt.  Denies depression, some issues with husband and communication.  She report MRI brain in past for memory issues.. Nml except for mild vessel narrowing. Per pt.   Fam hx: mom with dementia.  Review of Systems  Constitutional: Negative for fever and fatigue.  HENT: Negative for ear pain.   Eyes: Negative for pain.  Respiratory: Negative for chest tightness and shortness of breath.   Cardiovascular: Negative for chest pain, palpitations and leg swelling.  Gastrointestinal: Negative for abdominal pain.  Genitourinary: Negative for dysuria.  Neurological: Negative for light-headedness and numbness.  Psychiatric/Behavioral: Negative for behavioral problems and dysphoric mood. The patient is not nervous/anxious.    MMSE: 30/30 Clock drawing: good. Animal recall; 11 in 30 sec.    Objective:   Physical Exam        Assessment & Plan:

## 2015-02-10 LAB — RPR

## 2015-03-05 ENCOUNTER — Encounter: Payer: Self-pay | Admitting: Family Medicine

## 2015-03-05 ENCOUNTER — Ambulatory Visit (INDEPENDENT_AMBULATORY_CARE_PROVIDER_SITE_OTHER): Payer: Medicare Other | Admitting: Family Medicine

## 2015-03-05 VITALS — BP 110/62 | HR 59 | Temp 98.1°F | Ht 59.0 in | Wt 127.0 lb

## 2015-03-05 DIAGNOSIS — F439 Reaction to severe stress, unspecified: Secondary | ICD-10-CM

## 2015-03-05 DIAGNOSIS — R413 Other amnesia: Secondary | ICD-10-CM

## 2015-03-05 DIAGNOSIS — Z638 Other specified problems related to primary support group: Secondary | ICD-10-CM | POA: Diagnosis not present

## 2015-03-05 MED ORDER — PANTOPRAZOLE SODIUM 40 MG PO TBEC: 40.0000 mg | DELAYED_RELEASE_TABLET | Freq: Every day | ORAL | Status: DC

## 2015-03-05 NOTE — Patient Instructions (Addendum)
Stop at front desk to set up  Referral to counselor.  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  If memory issue worsening we can consider neurocognitive testing.

## 2015-03-05 NOTE — Progress Notes (Signed)
Pre visit review using our clinic review tool, if applicable. No additional management support is needed unless otherwise documented below in the visit note. 

## 2015-03-05 NOTE — Progress Notes (Signed)
   Subjective:    Patient ID: Jessica Hardin, female    DOB: 1940/09/18, 74 y.o.   MRN: [id number] i  HPI  74 year old female pt wth mild memory loss presents with concerns about  stress management x last few years, worse in last few months.  She is under a lot of stress. She does have excessive worry. She has trouble finding words to describe but it does sound like some anxiety stays with her through the whole day.  She does feel sadness that is more than she should expect but not often.Marland Kitchen  PHQ9 score 1. She has frequent waking at night. Trouble going back to sleep.  No panic attacks.   No change in memory. Not sure is her memory is an issue or if husband is causing issue. She gets irritated with him.  02/09/2015:  lab memory eval nml.  MMSE: 30/30 Clock drawing: good. Animal recall; 11 in 30 sec.   Review of Systems  Constitutional: Negative for fever and fatigue.  HENT: Negative for ear pain.   Eyes: Negative for pain.  Respiratory: Negative for chest tightness and shortness of breath.   Cardiovascular: Negative for chest pain, palpitations and leg swelling.  Gastrointestinal: Negative for abdominal pain.  Genitourinary: Negative for dysuria.       Objective:   Physical Exam  Constitutional: Vital signs are normal. She appears well-developed and well-nourished. She is cooperative.  Non-toxic appearance. She does not appear ill. No distress.  HENT:  Head: Normocephalic.  Right Ear: Hearing, tympanic membrane, external ear and ear canal normal. Tympanic membrane is not erythematous, not retracted and not bulging.  Left Ear: Hearing, tympanic membrane, external ear and ear canal normal. Tympanic membrane is not erythematous, not retracted and not bulging.  Nose: No mucosal edema or rhinorrhea. Right sinus exhibits no maxillary sinus tenderness and no frontal sinus tenderness. Left sinus exhibits no maxillary sinus tenderness and no frontal sinus tenderness.  Mouth/Throat: Uvula  is midline, oropharynx is clear and moist and mucous membranes are normal.  Eyes: Conjunctivae, EOM and lids are normal. Pupils are equal, round, and reactive to light. Lids are everted and swept, no foreign bodies found.  Neck: Trachea normal and normal range of motion. Neck supple. Carotid bruit is not present. No thyroid mass and no thyromegaly present.  Cardiovascular: Normal rate, regular rhythm, S1 normal, S2 normal, normal heart sounds, intact distal pulses and normal pulses.  Exam reveals no gallop and no friction rub.   No murmur heard. Pulmonary/Chest: Effort normal and breath sounds normal. No tachypnea. No respiratory distress. She has no decreased breath sounds. She has no wheezes. She has no rhonchi. She has no rales.  Abdominal: Soft. Normal appearance and bowel sounds are normal. There is no tenderness.  Neurological: She is alert.  Skin: Skin is warm, dry and intact. No rash noted.  Psychiatric: Her speech is normal and behavior is normal. Judgment and thought content normal. Her mood appears not anxious. Cognition and memory are normal. She does not exhibit a depressed mood.          Assessment & Plan:

## 2015-03-05 NOTE — Assessment & Plan Note (Signed)
Memory eval was nml in 01/2015 except slightly abnormality in animal recall. ? If mild memory changes due to stress.  Will consider neurocognitive testing  If not improving.

## 2015-03-05 NOTE — Assessment & Plan Note (Signed)
Pt does not clearly seem to qualify for diagnosis of major depression or Generalized anxiety, etc.  She does not seem to manage well with stress.  Will refer to counseling for better stress management techniques.

## 2015-03-16 ENCOUNTER — Ambulatory Visit (INDEPENDENT_AMBULATORY_CARE_PROVIDER_SITE_OTHER): Payer: No Typology Code available for payment source | Admitting: Psychology

## 2015-03-16 DIAGNOSIS — F4323 Adjustment disorder with mixed anxiety and depressed mood: Secondary | ICD-10-CM | POA: Diagnosis not present

## 2015-04-06 ENCOUNTER — Ambulatory Visit (INDEPENDENT_AMBULATORY_CARE_PROVIDER_SITE_OTHER): Payer: No Typology Code available for payment source | Admitting: Psychology

## 2015-04-06 DIAGNOSIS — F4323 Adjustment disorder with mixed anxiety and depressed mood: Secondary | ICD-10-CM | POA: Diagnosis not present

## 2015-04-27 ENCOUNTER — Ambulatory Visit (INDEPENDENT_AMBULATORY_CARE_PROVIDER_SITE_OTHER): Payer: No Typology Code available for payment source | Admitting: Psychology

## 2015-04-27 DIAGNOSIS — F4323 Adjustment disorder with mixed anxiety and depressed mood: Secondary | ICD-10-CM | POA: Diagnosis not present

## 2015-07-19 ENCOUNTER — Telehealth: Payer: Self-pay | Admitting: Family Medicine

## 2015-07-19 NOTE — Telephone Encounter (Signed)
agree

## 2015-07-19 NOTE — Telephone Encounter (Signed)
HAve pt make an appointment to be seen today or tommorow.

## 2015-07-19 NOTE — Telephone Encounter (Signed)
Jessica Hardin is already scheduled for 07/20/2015 at 1:30 pm with Dr. Diona Browner.

## 2015-07-19 NOTE — Telephone Encounter (Signed)
[facility]  Patient Name: Jessica Hardin  DOB: 1940-12-24    Initial Comment caller states she has diarrhea   Nurse Assessment  Nurse: Wayne Sever, RN, Tillie Rung Date/Time (Eastern Time): 07/19/2015 2:17:06 PM  Confirm and document reason for call. If symptomatic, describe symptoms. You must click the next button to save text entered. ---Caller states she has IBS with constipation. Sometimes she does not go for 2 weeks she states. She took Ducolax last night, she then had some vomiting and diarrhea. She states the cramping stopped after all that. She states she saw some something that looked bloody in the toilet, she is not sure if it was blood or urine in the toilet. She is feeling better, but stomach is sore from the cramping per caller. She is drinking fluids and has not seen the blood since.  Has the patient traveled out of the country within the last 30 days? ---Not Applicable  Does the patient have any new or worsening symptoms? ---Yes  Will a triage be completed? ---Yes  Related visit to physician within the last 2 weeks? ---N/A  Does the PT have any chronic conditions? (i.e. diabetes, asthma, etc.) ---Yes  List chronic conditions. ---IBS  Is this a behavioral health or substance abuse call? ---No     Guidelines    Guideline Title Affirmed Question Affirmed Notes  Rectal Bleeding MODERATE rectal bleeding (small blood clots, passing blood without stool, or toilet water turns red)    Final Disposition User   See Physician within 24 Hours Wayne Sever, RN, [employer]    Referrals  REFERRED TO PCP OFFICE   Disagree/Comply: Leta Baptist

## 2015-07-20 ENCOUNTER — Ambulatory Visit (INDEPENDENT_AMBULATORY_CARE_PROVIDER_SITE_OTHER): Payer: PPO | Admitting: Family Medicine

## 2015-07-20 ENCOUNTER — Encounter: Payer: Self-pay | Admitting: Family Medicine

## 2015-07-20 VITALS — BP 110/60 | HR 65 | Temp 98.5°F | Ht 59.0 in | Wt 125.2 lb

## 2015-07-20 DIAGNOSIS — R350 Frequency of micturition: Secondary | ICD-10-CM

## 2015-07-20 DIAGNOSIS — K921 Melena: Secondary | ICD-10-CM

## 2015-07-20 DIAGNOSIS — K648 Other hemorrhoids: Secondary | ICD-10-CM | POA: Insufficient documentation

## 2015-07-20 DIAGNOSIS — K5909 Other constipation: Secondary | ICD-10-CM | POA: Diagnosis not present

## 2015-07-20 LAB — POC URINALSYSI DIPSTICK (AUTOMATED)
BILIRUBIN UA: NEGATIVE
Blood, UA: NEGATIVE
Glucose, UA: NEGATIVE
Ketones, UA: NEGATIVE
LEUKOCYTES UA: NEGATIVE
NITRITE UA: NEGATIVE
PH UA: 6
Protein, UA: NEGATIVE
Spec Grav, UA: 1.015
Urobilinogen, UA: 0.2

## 2015-07-20 NOTE — Assessment & Plan Note (Signed)
Fiber , water, miralax.

## 2015-07-20 NOTE — Progress Notes (Signed)
   Subjective:    Patient ID: Jessica Hardin, female    DOB: 09-02-1940, 75 y.o.   MRN: [id number]  HPI 75 year old female presents with blood in toilet and constipation.  She has been bearing down with stools x 2 weeks  Has been using dandelion, prune and dulcolax for constipation. 2 days ago had abdominal cramp... Had BM, soft stool, cramping resolved. Followed with diarrhea throughout the day yesterday. Early yesterday morning...  Noted yesterday pinkish liquid with stool. She did pee as well.  No blood on toilet tissue.  No rectal pain.  No further pick or red in toilet.   No fever, no N/V.  No burning with urination, mild frequency.  No clear vaginal bleeding.    Social History /Family History/Past Medical History reviewed and updated if needed.  Last colonoscopy: 2008 1 polyp.  Per pt (no record) nml sigmoidoscopy 2013   Review of Systems  Constitutional: Negative for fever and fatigue.  HENT: Negative for ear pain.   Eyes: Negative for pain.  Respiratory: Negative for chest tightness and shortness of breath.   Cardiovascular: Negative for chest pain, palpitations and leg swelling.  Gastrointestinal: Negative for abdominal pain.  Genitourinary: Negative for dysuria.       Objective:   Physical Exam  Constitutional: Vital signs are normal. She appears well-developed and well-nourished. She is cooperative.  Non-toxic appearance. She does not appear ill. No distress.  HENT:  Head: Normocephalic.  Right Ear: Hearing, tympanic membrane, external ear and ear canal normal. Tympanic membrane is not erythematous, not retracted and not bulging.  Left Ear: Hearing, tympanic membrane, external ear and ear canal normal. Tympanic membrane is not erythematous, not retracted and not bulging.  Nose: No mucosal edema or rhinorrhea. Right sinus exhibits no maxillary sinus tenderness and no frontal sinus tenderness. Left sinus exhibits no maxillary sinus tenderness and no frontal sinus  tenderness.  Mouth/Throat: Uvula is midline, oropharynx is clear and moist and mucous membranes are normal.  Eyes: Conjunctivae, EOM and lids are normal. Pupils are equal, round, and reactive to light. Lids are everted and swept, no foreign bodies found.  Neck: Trachea normal and normal range of motion. Neck supple. Carotid bruit is not present. No thyroid mass and no thyromegaly present.  Cardiovascular: Normal rate, regular rhythm, S1 normal, S2 normal, normal heart sounds, intact distal pulses and normal pulses.  Exam reveals no gallop and no friction rub.   No murmur heard. Pulmonary/Chest: Effort normal and breath sounds normal. No tachypnea. No respiratory distress. She has no decreased breath sounds. She has no wheezes. She has no rhonchi. She has no rales.  Abdominal: Soft. Normal appearance and bowel sounds are normal. There is no tenderness.  Neurological: She is alert.  Skin: Skin is warm, dry and intact. No rash noted.  Psychiatric: Her speech is normal and behavior is normal. Judgment and thought content normal. Her mood appears not anxious. Cognition and memory are normal. She does not exhibit a depressed mood.    Anoscopy: No fissure, no mass, nontender, small int hemorrhoids seena nd skin tag at anal opening. no complications, no bleeding.        Assessment & Plan:

## 2015-07-20 NOTE — Progress Notes (Signed)
Pre visit review using our clinic review tool, if applicable. No additional management support is needed unless otherwise documented below in the visit note. 

## 2015-07-20 NOTE — Patient Instructions (Addendum)
Blood in toilet due tpo constipation and irritation of small internal hemorrhoids. Increase fiber and water in diet. Start daily miralax . Call if blood in stool returns or constipation not resolved.  Urine clear.. No blood.

## 2015-07-20 NOTE — Assessment & Plan Note (Signed)
Due to irritation of small internal hemorrhoids.

## 2015-07-20 NOTE — Assessment & Plan Note (Signed)
Mild, not resolved. Liekly due to irritaion for constipation.

## 2015-07-20 NOTE — Addendum Note (Signed)
Addended by: Carter Kitten on: 07/20/2015 02:39 PM   Modules accepted: Orders

## 2015-07-28 DIAGNOSIS — H40003 Preglaucoma, unspecified, bilateral: Secondary | ICD-10-CM | POA: Diagnosis not present

## 2015-08-31 ENCOUNTER — Telehealth: Payer: Self-pay | Admitting: Family Medicine

## 2015-08-31 NOTE — Telephone Encounter (Signed)
Patient Name: Jessica Hardin  DOB: 07-08-1940    Initial Comment Caller states she has nasal congestion and cough   Nurse Assessment  Nurse: Mallie Mussel, RN, Alveta Heimlich Date/Time (Eastern Time): 08/31/2015 10:35:08 AM  Confirm and document reason for call. If symptomatic, describe symptoms. You must click the next button to save text entered. ---Caller states that she has a cough which began last Thursday. She has sinus problems all year. She has a stuffy nose but she does post nasal drip into her throat "all the time." She states has some sinus pressure and pain. She feels like she has had fever, but not at present. Denies redness of skin, and denies swelling.  Has the patient traveled out of the country within the last 30 days? ---No  Does the patient have any new or worsening symptoms? ---Yes  Will a triage be completed? ---Yes  Related visit to physician within the last 2 weeks? ---No  Does the PT have any chronic conditions? (i.e. diabetes, asthma, etc.) ---No  Is this a behavioral health or substance abuse call? ---No     Guidelines    Guideline Title Affirmed Question Affirmed Notes  Sinus Pain or Congestion Lots of coughing    Final Disposition User   See PCP When Office is Open (within 3 days) Mallie Mussel, RN, Alveta Heimlich    Comments  Dr. Diona Browner does not have any appointments available today and will not be in the office tomorrow. Dr. Deborra Medina has an appointment available tomorrow morning at 9:00am. Appointment scheduled.   Referrals  REFERRED TO PCP OFFICE   Disagree/Comply: Comply

## 2015-08-31 NOTE — Telephone Encounter (Signed)
Pt has appt 09/01/15 at 9AM with Dr Deborra Medina.

## 2015-09-01 ENCOUNTER — Ambulatory Visit: Payer: Self-pay | Admitting: Family Medicine

## 2015-09-08 ENCOUNTER — Ambulatory Visit (INDEPENDENT_AMBULATORY_CARE_PROVIDER_SITE_OTHER): Payer: PPO | Admitting: Family Medicine

## 2015-09-08 ENCOUNTER — Encounter: Payer: Self-pay | Admitting: Family Medicine

## 2015-09-08 VITALS — BP 120/68 | HR 62 | Temp 98.7°F | Wt 123.8 lb

## 2015-09-08 DIAGNOSIS — J0100 Acute maxillary sinusitis, unspecified: Secondary | ICD-10-CM

## 2015-09-08 MED ORDER — FLUTICASONE PROPIONATE 50 MCG/ACT NA SUSP: 2.0000 | Freq: Every day | NASAL | Status: DC | PRN

## 2015-09-08 MED ORDER — AMOXICILLIN-POT CLAVULANATE 875-125 MG PO TABS: 1.0000 | ORAL_TABLET | Freq: Two times a day (BID) | ORAL | Status: DC

## 2015-09-08 NOTE — Patient Instructions (Addendum)
Use the neti pot and flonase.  Start the antibiotics in the meantime.  Take care.  Glad to see you.

## 2015-09-08 NOTE — Progress Notes (Signed)
Pre visit review using our clinic review tool, if applicable. No additional management support is needed unless otherwise documented below in the visit note.  Sx started about 2 weeks ago.  Off flonase in the meantime.   Started with sinus drip.  Worse in the meantime.  Post nasal gtt worse at night, disrupting sleep.  Now with discolored rhinorrhea/sputum.   No fevers.  Facial pain noted, R max sinus, more pain leaning over.  No ear pain.   No vomiting.  Had diarrhea initially but resolved now.   H/o constipation at baseline.    Meds, vitals, and allergies reviewed.   ROS: See HPI.  Otherwise, noncontributory.  GEN: nad, alert and oriented HEENT: mucous membranes moist, tm w/o erythema, nasal exam w/o erythema, clear discharge noted,  OP with cobblestoning, R max sinus ttp NECK: supple w/o LA CV: rrr.   PULM: ctab, no inc wob EXT: no edema

## 2015-09-12 DIAGNOSIS — J0100 Acute maxillary sinusitis, unspecified: Secondary | ICD-10-CM | POA: Insufficient documentation

## 2015-09-12 NOTE — Assessment & Plan Note (Signed)
Nontoxic, d/w pt.  neti pot and flonase, augmentin, f/u prn. She agrees.

## 2015-09-13 ENCOUNTER — Telehealth: Payer: Self-pay | Admitting: Family Medicine

## 2015-09-13 NOTE — Telephone Encounter (Signed)
[facility]  Patient Name: Jessica Hardin  DOB: [birth year]    Initial Comment Caller states she has been having side pain that spread to abdomen, has tissue or sediment in darker urine, side pain stopped but abdominal pain is off and on   Nurse Assessment  Nurse: Wayne Sever, RN, Tillie Rung Date/Time (Eastern Time): 09/13/2015 1:26:50 PM  Confirm and document reason for call. If symptomatic, describe symptoms. You must click the next button to save text entered. ---Caller states she had a pain in her left side that spread across her stomach. She states she was having issues urinating, and then it got better. She states she saw sediments in her pee. She states she saw them again this morning. She is wondering if she needs to be concerned. Denies any more pain.  Has the patient traveled out of the country within the last 30 days? ---Not Applicable  Does the patient have any new or worsening symptoms? ---Yes  Will a triage be completed? ---Yes  Related visit to physician within the last 2 weeks? ---No  Does the PT have any chronic conditions? (i.e. diabetes, asthma, etc.) ---Yes  List chronic conditions. ---IBS, Constipation, Sinus Issues  Is this a behavioral health or substance abuse call? ---No     Guidelines    Guideline Title Affirmed Question Affirmed Notes       Final Disposition User        Comments  Appointment scheduled on 03/31 at [facility]

## 2015-09-13 NOTE — Telephone Encounter (Signed)
Pt has appt with Dr Diona Browner on 03/31/17at 10:30.

## 2015-09-17 ENCOUNTER — Ambulatory Visit (INDEPENDENT_AMBULATORY_CARE_PROVIDER_SITE_OTHER): Payer: PPO | Admitting: Family Medicine

## 2015-09-17 ENCOUNTER — Encounter: Payer: Self-pay | Admitting: Family Medicine

## 2015-09-17 VITALS — BP 120/64 | HR 60 | Temp 98.3°F | Ht 59.0 in | Wt 125.0 lb

## 2015-09-17 DIAGNOSIS — R1030 Lower abdominal pain, unspecified: Secondary | ICD-10-CM | POA: Diagnosis not present

## 2015-09-17 LAB — POC URINALSYSI DIPSTICK (AUTOMATED)
Bilirubin, UA: NEGATIVE
Glucose, UA: NEGATIVE
KETONES UA: NEGATIVE
Leukocytes, UA: NEGATIVE
Nitrite, UA: NEGATIVE
PROTEIN UA: NEGATIVE
RBC UA: NEGATIVE
SPEC GRAV UA: 1.02
UROBILINOGEN UA: 0.2
pH, UA: 6.5

## 2015-09-17 NOTE — Patient Instructions (Addendum)
Drink water, high fiber diet.  Call if symptoms return.

## 2015-09-17 NOTE — Assessment & Plan Note (Signed)
Pain was more upper.  resolved now. ? Small stones passed versus gas pain vs constipation.  Increase water and fiber in diet. Info given.

## 2015-09-17 NOTE — Progress Notes (Signed)
   Subjective:    Patient ID: Jessica Hardin, female    DOB: 1940-06-23, 75 y.o.   MRN: [id number]  HPI  75 year old female presents with sudden onset pain in LUQ before a  meal, spread to RUQ resolve in 24 hours. Pain worsened with urination. No dysuria, no frequency or urgency, hematuria.  Pain resolved on its own, but the next day saw sediment in urine, in bottom of toilet.  No N/V/ D/C. No fever. Did have episode of constipation the week prior, resolved with miralax.  Nml po intake, no UOp.   Social History /Family History/Past Medical History reviewed and updated if needed.   Review of Systems  Constitutional: Negative for fever and fatigue.  HENT: Negative for ear pain.   Eyes: Negative for pain.  Respiratory: Negative for chest tightness and shortness of breath.   Cardiovascular: Negative for chest pain, palpitations and leg swelling.  Gastrointestinal: Negative for abdominal pain.  Genitourinary: Negative for dysuria.       Objective:   Physical Exam  Constitutional: Vital signs are normal. She appears well-developed and well-nourished. She is cooperative.  Non-toxic appearance. She does not appear ill. No distress.  HENT:  Head: Normocephalic.  Right Ear: Hearing, tympanic membrane, external ear and ear canal normal. Tympanic membrane is not erythematous, not retracted and not bulging.  Left Ear: Hearing, tympanic membrane, external ear and ear canal normal. Tympanic membrane is not erythematous, not retracted and not bulging.  Nose: No mucosal edema or rhinorrhea. Right sinus exhibits no maxillary sinus tenderness and no frontal sinus tenderness. Left sinus exhibits no maxillary sinus tenderness and no frontal sinus tenderness.  Mouth/Throat: Uvula is midline, oropharynx is clear and moist and mucous membranes are normal.  Eyes: Conjunctivae, EOM and lids are normal. Pupils are equal, round, and reactive to light. Lids are everted and swept, no foreign bodies found.    Neck: Trachea normal and normal range of motion. Neck supple. Carotid bruit is not present. No thyroid mass and no thyromegaly present.  Cardiovascular: Normal rate, regular rhythm, S1 normal, S2 normal, normal heart sounds, intact distal pulses and normal pulses.  Exam reveals no gallop and no friction rub.   No murmur heard. Pulmonary/Chest: Effort normal and breath sounds normal. No tachypnea. No respiratory distress. She has no decreased breath sounds. She has no wheezes. She has no rhonchi. She has no rales.  Abdominal: Soft. Normal appearance and bowel sounds are normal. There is generalized tenderness.  Very mild almost nonexistant pain per pt in abdomen, more like bloating per pt.  Neurological: She is alert.  Skin: Skin is warm, dry and intact. No rash noted.  Psychiatric: Her speech is normal and behavior is normal. Judgment and thought content normal. Her mood appears not anxious. Cognition and memory are normal. She does not exhibit a depressed mood.          Assessment & Plan:

## 2015-09-17 NOTE — Progress Notes (Signed)
Pre visit review using our clinic review tool, if applicable. No additional management support is needed unless otherwise documented below in the visit note. 

## 2015-10-18 ENCOUNTER — Telehealth: Payer: Self-pay | Admitting: Family Medicine

## 2015-10-18 NOTE — Telephone Encounter (Signed)
Pt called and sad that on Saturday morning she had a spot of blood when going to the bathroom.  It has only happened once. She wants to know if she neds an appt? cb number is   [phone] Thanks you

## 2015-10-18 NOTE — Telephone Encounter (Signed)
Pt saw a spot of bright red blood on middle of panty liner on 10/16/15. Pt has not seen any blood since then. No abd or back pain. No fever. No burning or pain or frequency of urine. Pt does not want to schedule appt but wants to know what Dr Diona Browner thinks. Pt request cb. Pt has appt on 11/04/15 for medicare wellness with Candis Musa.[facility] [address].

## 2015-10-18 NOTE — Telephone Encounter (Signed)
Please call and triage patient.  

## 2015-10-18 NOTE — Telephone Encounter (Signed)
As long as no other symptoms, we can eval at upcoming appt. If new symotms or recurrence of blood on underware.. Make appt to be seen sooner.

## 2015-10-19 NOTE — Telephone Encounter (Signed)
Jessica Hardin notified as instructed by telephone.  

## 2015-11-04 ENCOUNTER — Ambulatory Visit (INDEPENDENT_AMBULATORY_CARE_PROVIDER_SITE_OTHER): Payer: PPO

## 2015-11-04 VITALS — BP 118/76 | HR 71 | Temp 98.5°F | Ht 58.5 in | Wt 126.5 lb

## 2015-11-04 DIAGNOSIS — E2839 Other primary ovarian failure: Secondary | ICD-10-CM | POA: Diagnosis not present

## 2015-11-04 DIAGNOSIS — Z0000 Encounter for general adult medical examination without abnormal findings: Secondary | ICD-10-CM

## 2015-11-04 DIAGNOSIS — K219 Gastro-esophageal reflux disease without esophagitis: Secondary | ICD-10-CM | POA: Diagnosis not present

## 2015-11-04 LAB — COMPREHENSIVE METABOLIC PANEL
ALBUMIN: 4 g/dL (ref 3.5–5.2)
ALK PHOS: 62 U/L (ref 39–117)
ALT: 12 U/L (ref 0–35)
AST: 19 U/L (ref 0–37)
BUN: 13 mg/dL (ref 6–23)
CALCIUM: 9.9 mg/dL (ref 8.4–10.5)
CHLORIDE: 105 meq/L (ref 96–112)
CO2: 33 mEq/L — ABNORMAL HIGH (ref 19–32)
CREATININE: 0.8 mg/dL (ref 0.40–1.20)
GFR: 89.65 mL/min (ref >60.00)
Glucose, Bld: 66 mg/dL — ABNORMAL LOW (ref 70–99)
Potassium: 3.5 mEq/L (ref 3.5–5.1)
Sodium: 142 mEq/L (ref 135–145)
TOTAL PROTEIN: 6.9 g/dL (ref 6.0–8.3)
Total Bilirubin: 0.3 mg/dL (ref 0.2–1.2)

## 2015-11-04 NOTE — Progress Notes (Signed)
Subjective:   Jessica Hardin is a 75 y.o. female who presents for Medicare Annual (Subsequent) preventive examination.  Review of Systems:  N/A Cardiac Risk Factors include: advanced age (>69men, >18 women);sedentary lifestyle     Objective:     Vitals: BP 118/76 mmHg  Pulse 71  Temp(Src) 98.5 F (36.9 C) (Oral)  Ht 4' 10.5" (1.486 m)  Wt 126 lb 8 oz (57.38 kg)  BMI 25.99 kg/m2  SpO2 96%  Body mass index is 25.99 kg/(m^2).   Tobacco History  Smoking status  . Never Smoker   Smokeless tobacco  . Never Used     Counseling given: No   Past Medical History  Diagnosis Date  . Asthma   . Allergy   . History of chicken pox   . IBS (irritable bowel syndrome)   . Migraine   . GERD (gastroesophageal reflux disease)   . Urinary tract infection    Past Surgical History  Procedure Laterality Date  . Abdominal hysterectomy    . Tubal ligation    . Cataract extraction, bilateral     Family History  Problem Relation Age of Onset  . Arthritis Mother   . Hypertension Mother   . Arthritis Sister   . Hypertension Sister   . Colon cancer Brother 40  . Breast cancer Sister   . Breast cancer Sister   . Uterine cancer Sister 97   History  Sexual Activity  . Sexual Activity: Yes    Outpatient Encounter Prescriptions as of 11/04/2015  Medication Sig  . DANDELION PO Take by mouth daily.  . fluticasone (FLONASE) 50 MCG/ACT nasal spray Place 2 sprays into both nostrils daily as needed. Reported on 09/08/2015  . levocetirizine (XYZAL) 5 MG tablet Take 1 tablet (5 mg total) by mouth every evening. (Patient taking differently: Take 5 mg by mouth daily as needed. )  . Multiple Vitamin (MULTIVITAMIN) capsule Take 1 capsule by mouth daily.   . pantoprazole (PROTONIX) 40 MG tablet Take 1 tablet (40 mg total) by mouth daily.  . polyethylene glycol (MIRALAX / GLYCOLAX) packet Take 17 g by mouth daily as needed for mild constipation.  . [DISCONTINUED] amoxicillin-clavulanate  (AUGMENTIN) 875-125 MG tablet Take 1 tablet by mouth 2 (two) times daily.   No facility-administered encounter medications on file as of 11/04/2015.    Activities of Daily Living In your present state of health, do you have any difficulty performing the following activities: 11/04/2015  Hearing? N  Vision? N  Difficulty concentrating or making decisions? N  Walking or climbing stairs? N  Dressing or bathing? N  Doing errands, shopping? N  Preparing Food and eating ? N  Using the Toilet? N  In the past six months, have you accidently leaked urine? N  Do you have problems with loss of bowel control? N  Managing your Medications? N  Managing your Finances? N  Housekeeping or managing your Housekeeping? N    Patient Care Team: Jinny Sanders, MD as PCP - General (Family Medicine) Brayton Mars, MD as Referring Physician (Obstetrics and Gynecology) Robert Bellow, MD (General Surgery) Estill Cotta, MD as Referring Physician (Ophthalmology)    Assessment:     Exercise Activities and Dietary recommendations Current Exercise Habits: The patient does not participate in regular exercise at present, Exercise limited by: None identified  Goals    . sleep management     Starting 11/04/2015, I will continue to do deep breathing exercises whenever I feel stressed and  when I unable to sleep at night.       Fall Risk Fall Risk  11/04/2015  Falls in the past year? No   Depression Screen PHQ 2/9 Scores 11/04/2015 03/05/2015 09/08/2014  PHQ - 2 Score 0 1 1     Cognitive Testing MMSE - Mini Mental State Exam 11/04/2015  Orientation to time 5  Orientation to Place 5  Registration 3  Attention/ Calculation 0  Recall 3  Language- name 2 objects 0  Language- repeat 1  Language- follow 3 step command 3  Language- read & follow direction 0  Write a sentence 0  Copy design 0  Total score 20   PLEASE NOTE: A Mini-Cog screen was completed. Maximum score is 20. A value of 0  denotes this part of [place] MMSE was not completed or the patient failed this part of the Mini-Cog screening.   Mini-Cog Screening Orientation to Time - Max 5 pts Orientation to Place - Max 5 pts Registration - Max 3 pts Recall - Max 3 pts Language Repeat - Max 1 pts Language Follow 3 Step Command - Max 3 pts   There is no immunization history on file for this patient. Screening Tests Health Maintenance  Topic Date Due  . DEXA SCAN  03/19/2016 (Originally 09/07/2004)  . ZOSTAVAX  11/03/2016 (Originally 09/08/1999)  . TETANUS/TDAP  11/03/2016 (Originally 09/08/1958)  . PNA vac Low Risk Adult (1 of 2 - PCV13) 11/03/2016 (Originally 09/07/2004)  . INFLUENZA VACCINE  01/18/2016      Plan:     I have personally reviewed and addressed the Medicare Annual Wellness questionnaire and have noted the following in the patient's chart:  A. Medical and social history B. Use of alcohol, tobacco or illicit drugs  C. Current medications and supplements D. Functional ability and status E.  Nutritional status F.  Physical activity G. Advance directives H. List of other physicians I.  Hospitalizations, surgeries, and ER visits in previous 12 months J.  [place] to include hearing, vision, cognitive, depression L. Referrals and appointments - none  In addition, I have reviewed and discussed with patient certain preventive protocols, quality metrics, and best practice recommendations. A written personalized care plan for preventive services as well as general preventive health recommendations were provided to patient.  See attached scanned questionnaire for additional information.   Signed,   Lindell Noe, MHA, BS, LPN Health Advisor

## 2015-11-04 NOTE — Progress Notes (Signed)
I reviewed health advisor's note, was available for consultation, and agree with documentation and plan.  Carney Saxton, MD [facility] [place]  

## 2015-11-04 NOTE — Progress Notes (Signed)
Pre visit review using our clinic review tool, if applicable. No additional management support is needed unless otherwise documented below in the visit note. 

## 2015-11-04 NOTE — Progress Notes (Signed)
PCP notes:  Health maintenance:  PNA vaccine - postponed per pt request  Shingles vaccine - postponed for insurance verification  Tetanus vaccine - postponed for insurance verifcation  Bone density - Scheduled 11/18/15  Abnormal screenings: None  Patient concerns: None  Nurse concerns: None  Next PCP appt: 11/16/15 @ 9AM

## 2015-11-04 NOTE — Patient Instructions (Addendum)
Ms. Quillman , Thank you for taking time to come for your Medicare Wellness Visit. I appreciate your ongoing commitment to your health goals. Please review the following plan we discussed and let me know if I can assist you in the future.   These are the goals we discussed: Goals    . sleep management     Starting 11/04/2015, I will continue to do deep breathing exercises whenever I feel stressed and when I unable to sleep at night.        This is a list of the screening recommended for you and due dates:  Health Maintenance  Topic Date Due  . DEXA scan (bone density measurement)  03/19/2016*  . Shingles Vaccine  11/03/2016*  . Tetanus Vaccine  11/03/2016*  . Pneumonia vaccines (1 of 2 - PCV13) 11/03/2016*  . Flu Shot  01/18/2016  *Topic was postponed. The date shown is not the original due date.    Preventive Care for Adults  A healthy lifestyle and preventive care can promote health and wellness. Preventive health guidelines for adults include the following key practices.  . A routine yearly physical is a good way to check with your health care provider about your health and preventive screening. It is a chance to share any concerns and updates on your health and to receive a thorough exam.  . Visit your dentist for a routine exam and preventive care every 6 months. Brush your teeth twice a day and floss once a day. Good oral hygiene prevents tooth decay and gum disease.  . The frequency of eye exams is based on your age, health, family medical history, use  of contact lenses, and other factors. Follow your health care provider's ecommendations for frequency of eye exams.  . Eat a healthy diet. Foods like vegetables, fruits, whole grains, low-fat dairy products, and lean protein foods contain the nutrients you need without too many calories. Decrease your intake of foods high in solid fats, added sugars, and salt. Eat the right amount of calories for you. Get information about a  proper diet from your health care provider, if necessary.  . Regular physical exercise is one of the most important things you can do for your health. Most adults should get at least 150 minutes of moderate-intensity exercise (any activity that increases your heart rate and causes you to sweat) each week. In addition, most adults need muscle-strengthening exercises on 2 or more days a week.  Silver Sneakers may be a benefit available to you. To determine eligibility, you may visit the website: www.silversneakers.com or contact program at (602)262-5346 Mon-Fri between 8AM-8PM.   . Maintain a healthy weight. The body mass index (BMI) is a screening tool to identify possible weight problems. It provides an estimate of body fat based on height and weight. Your health care provider can find your BMI and can help you achieve or maintain a healthy weight.   For adults 20 years and older: ? A BMI below 18.5 is considered underweight. ? A BMI of 18.5 to 24.9 is normal. ? A BMI of 25 to 29.9 is considered overweight. ? A BMI of 30 and above is considered obese.   . Maintain normal blood lipids and cholesterol levels by exercising and minimizing your intake of saturated fat. Eat a balanced diet with plenty of fruit and vegetables. Blood tests for lipids and cholesterol should begin at age 72 and be repeated every 5 years. If your lipid or cholesterol levels are high, you  are over 35, or you are at high risk for heart disease, you may need your cholesterol levels checked more frequently. Ongoing high lipid and cholesterol levels should be treated with medicines if diet and exercise are not working.  . If you smoke, find out from your health care provider how to quit. If you do not use tobacco, please do not start.  . If you choose to drink alcohol, please do not consume more than 2 drinks per day. One drink is considered to be 12 ounces (355 mL) of beer, 5 ounces (148 mL) of wine, or 1.5 ounces (44 mL) of  liquor.  . If you are 23-27 years old, ask your health care provider if you should take aspirin to prevent strokes.  . Use sunscreen. Apply sunscreen liberally and repeatedly throughout the day. You should seek shade when your shadow is shorter than you. Protect yourself by wearing long sleeves, pants, a wide-brimmed hat, and sunglasses year round, whenever you are outdoors.  . Once a month, do a whole body skin exam, using a mirror to look at the skin on your back. Tell your health care provider of new moles, moles that have irregular borders, moles that are larger than a pencil eraser, or moles that have changed in shape or color.

## 2015-11-16 ENCOUNTER — Telehealth: Payer: Self-pay

## 2015-11-16 ENCOUNTER — Ambulatory Visit: Payer: Self-pay | Admitting: Family Medicine

## 2015-11-16 DIAGNOSIS — Z0289 Encounter for other administrative examinations: Secondary | ICD-10-CM

## 2015-11-16 NOTE — Telephone Encounter (Signed)
Pt has rescheduled annual exam on 11/22/15 at 8:30 with Dr Diona Browner.

## 2015-11-16 NOTE — Telephone Encounter (Signed)
PLEASE NOTE: All timestamps contained within this report are represented as [place] Standard Time. CONFIDENTIALTY NOTICE: This fax transmission is intended only for the addressee. It contains information that is legally privileged, confidential or otherwise protected from use or disclosure. If you are not the intended recipient, you are strictly prohibited from reviewing, disclosing, copying using or disseminating any of this information or taking any action in reliance on or regarding this information. If you have received this fax in error, please notify us immediately by telephone so that we can arrange for its return to [place]. Phone: [phone], Toll-Free: [phone], Fax: [phone] Page: 1 of 1 Call Id: [id number] [place] [facility] - Client Nonclinical Telephone Record [place] [facility] - Client Client Site [place] - Day Physician Eliezer Lofts - MD Contact Type Call Who Is Calling Patient / Member / Family / Caregiver Caller Name [place] Phone Number [phone] Patient Name Jessica Hardin Call Type Message Only Information Provided Reason for Call Request to Reschedule Office Appointment Initial Comment Caller needs to reschedule appt, declined triage. Call Closed By: Caryl Comes Transaction Date/Time: 11/15/2015 12:30:34 PM (ET)

## 2015-11-18 ENCOUNTER — Ambulatory Visit
Admission: RE | Admit: 2015-11-18 | Discharge: 2015-11-18 | Disposition: A | Discharge status: Home / Self Care | Payer: PPO | Source: Ambulatory Visit | Attending: Family Medicine | Admitting: Family Medicine

## 2015-11-18 ENCOUNTER — Encounter: Payer: Self-pay | Admitting: Family Medicine

## 2015-11-18 DIAGNOSIS — E2839 Other primary ovarian failure: Secondary | ICD-10-CM | POA: Diagnosis not present

## 2015-11-18 DIAGNOSIS — M85851 Other specified disorders of bone density and structure, right thigh: Secondary | ICD-10-CM | POA: Diagnosis not present

## 2015-11-18 DIAGNOSIS — M8588 Other specified disorders of bone density and structure, other site: Secondary | ICD-10-CM | POA: Insufficient documentation

## 2015-11-18 DIAGNOSIS — M8580 Other specified disorders of bone density and structure, unspecified site: Secondary | ICD-10-CM | POA: Insufficient documentation

## 2015-11-22 ENCOUNTER — Ambulatory Visit: Payer: PPO | Admitting: Family Medicine

## 2015-11-22 ENCOUNTER — Telehealth: Payer: Self-pay | Admitting: Family Medicine

## 2015-11-22 NOTE — Telephone Encounter (Signed)
Needs to reschedule CPX.  By the way for me ANY CPX needs to be rescheduled if they miss it.

## 2015-11-22 NOTE — Telephone Encounter (Signed)
Patient did not come for their scheduled appointment today for cpx  Please let me know if the patient needs to be contacted immediately for follow up or if no follow up is necessary.   ° °

## 2015-11-23 NOTE — Telephone Encounter (Signed)
8/2 pt aware

## 2016-01-19 ENCOUNTER — Ambulatory Visit (INDEPENDENT_AMBULATORY_CARE_PROVIDER_SITE_OTHER): Payer: PPO | Admitting: Family Medicine

## 2016-01-19 ENCOUNTER — Encounter: Payer: Self-pay | Admitting: Family Medicine

## 2016-01-19 VITALS — BP 124/64 | HR 59 | Temp 97.7°F | Ht 58.5 in | Wt 127.5 lb

## 2016-01-19 DIAGNOSIS — K5909 Other constipation: Secondary | ICD-10-CM | POA: Diagnosis not present

## 2016-01-19 DIAGNOSIS — K219 Gastro-esophageal reflux disease without esophagitis: Secondary | ICD-10-CM

## 2016-01-19 DIAGNOSIS — R319 Hematuria, unspecified: Secondary | ICD-10-CM | POA: Diagnosis not present

## 2016-01-19 DIAGNOSIS — M8580 Other specified disorders of bone density and structure, unspecified site: Secondary | ICD-10-CM | POA: Diagnosis not present

## 2016-01-19 DIAGNOSIS — R413 Other amnesia: Secondary | ICD-10-CM

## 2016-01-19 LAB — POC URINALSYSI DIPSTICK (AUTOMATED)
BILIRUBIN UA: NEGATIVE
Blood, UA: NEGATIVE
Glucose, UA: NEGATIVE
KETONES UA: NEGATIVE
LEUKOCYTES UA: NEGATIVE
Nitrite, UA: NEGATIVE
PH UA: 6.5
PROTEIN UA: NEGATIVE
Spec Grav, UA: 1.01
Urobilinogen, UA: 0.2

## 2016-01-19 MED ORDER — PANTOPRAZOLE SODIUM 40 MG PO TBEC: 40.0000 mg | DELAYED_RELEASE_TABLET | Freq: Every day | ORAL | 1 refills | Status: DC

## 2016-01-19 NOTE — Addendum Note (Signed)
Addended by: Carter Kitten on: 01/19/2016 03:02 PM   Modules accepted: Orders

## 2016-01-19 NOTE — Assessment & Plan Note (Signed)
Improved control on dandelion. Does not require miralax.  NMl BM daily.

## 2016-01-19 NOTE — Progress Notes (Signed)
Pre visit review using our clinic review tool, if applicable. No additional management support is needed unless otherwise documented below in the visit note. 

## 2016-01-19 NOTE — Progress Notes (Signed)
Earlier  she saw Candis Musa, LPN for medicare wellness on 11/04/2015 PNA vaccine - postponed per pt request  Shingles vaccine - postponed for insurance verification  Tetanus vaccine - postponed for insurance verifcation  Bone density - Scheduled 11/18/15  Abnormal screenings: None  Patient concerns: None  Nurse concerns: None  Today she presents for PART 2 AMW.  She reports several months ago she say saw drop of bright red blood on toilet tissue after urinating several month ago. No dysuria, no abd pain.  Has had no further episode.  GERD:on protonix 40 mg daily prn.IMproved with lifestyle.   Constipation, improved: Improved control on dandelion. Does not require miralax. NMl BM daily.  Exercise: walking  Diet: healthy  Social History /Family History/Past Medical History reviewed and updated if needed.  Review of Systems  Constitutional: Negative for fever, fatigue and unexpected weight change.  HENT: Negative for congestion, ear pain, sinus pressure, sneezing, sore throat and trouble swallowing.   Eyes: Negative for pain and itching.  Respiratory: Negative for cough, shortness of breath and wheezing.   Cardiovascular: Negative for chest pain, palpitations and leg swelling.  Gastrointestinal: Positive for constipation. Negative for nausea, abdominal pain, diarrhea and blood in stool.  Genitourinary: Negative for dysuria, hematuria and difficulty urinating.  Skin: Negative for rash.  Neurological: Positive for dizziness. Negative for syncope, weakness, numbness and headaches.  Psychiatric/Behavioral: Negative for confusion and dysphoric mood. The patient is not nervous/anxious.        Objective:   Physical Exam  Constitutional: Vital signs are normal. She appears well-developed and well-nourished. She is cooperative.  Non-toxic appearance. She does not appear ill. No distress.  HENT:  Head: Normocephalic.  Right Ear: Hearing, tympanic membrane, external  ear and ear canal normal.  Left Ear: Hearing, tympanic membrane, external ear and ear canal normal.  Nose: Nose normal.  Eyes: Conjunctivae, EOM and lids are normal. Pupils are equal, round, and reactive to light. Lids are everted and swept, no foreign bodies found.  Neck: Trachea normal and normal range of motion. Neck supple. Carotid bruit is not present. No thyroid mass and no thyromegaly present.  Cardiovascular: Normal rate, regular rhythm, S1 normal, S2 normal, normal heart sounds and intact distal pulses.  Exam reveals no gallop.   No murmur heard. Pulmonary/Chest: Effort normal and breath sounds normal. No respiratory distress. She has no wheezes. She has no rhonchi. She has no rales.  Abdominal: Soft. Normal appearance and bowel sounds are normal. She exhibits no distension, no fluid wave, no abdominal bruit and no mass. There is no hepatosplenomegaly. There is no tenderness. There is no rebound, no guarding and no CVA tenderness. No hernia.  Genitourinary: No breast swelling, tenderness, discharge or bleeding. Pelvic exam was performed with patient supine.  Lymphadenopathy:    She has no cervical adenopathy.    She has no axillary adenopathy.  Neurological: She is alert. She has normal strength. No cranial nerve deficit or sensory deficit.  Skin: Skin is warm, dry and intact. No rash noted.  Psychiatric: Her speech is normal and behavior is normal. Judgment normal. Her mood appears not anxious. Cognition and memory are normal. She does not exhibit a depressed mood.          Assessment & Plan:  The patient's preventative maintenance and recommended screening tests for an annual wellness exam were reviewed in full today. Brought up to date unless services declined.  Counselled on the importance of diet, exercise, and its role in overall health  and mortality. The patient's FH and SH was reviewed, including their home life, tobacco status, and drug and alcohol status.     Last mammo 01/2014, not indicated age age >49 Colonoscopy: colonoscopy nml 2008 Dr. Tiffany Kocher. Since then saw Dr. Delano Metz, [facility]. 2009 sigmoid nml Brother with colon cancer. ? When due for repeat  PAP/DVE: TAH, not indicated DXA: 11/2015 osteopenia, repeat in 5 years Nonsmoker

## 2016-01-19 NOTE — Assessment & Plan Note (Signed)
Continue good control with lifestyle and diet. USe protonix prn.

## 2016-01-19 NOTE — Assessment & Plan Note (Signed)
Stable on Mental status exam.

## 2016-01-19 NOTE — Patient Instructions (Addendum)
Consider pneumonia vaccine, shingles and TDAp.  Call GI to determine when they want you can for colon cancer screening.

## 2016-01-19 NOTE — Assessment & Plan Note (Signed)
Repeat due in 2-5 years.. Given borderline likely 5 years.

## 2016-01-19 NOTE — Assessment & Plan Note (Signed)
No sign of infection. Was self limited. Check UA today.  TAH, so not clearly vaginal. Pt refused vaginal exam today, this is reasonable given blood on toilet tissue no longer occurring.

## 2016-01-28 DIAGNOSIS — H1131 Conjunctival hemorrhage, right eye: Secondary | ICD-10-CM | POA: Diagnosis not present

## 2016-02-24 ENCOUNTER — Telehealth: Payer: Self-pay | Admitting: Family Medicine

## 2016-02-24 NOTE — Telephone Encounter (Signed)
Pt came in requesting review of her account. She has no show fee which she disputed but hasn't heard anything and now received a bill for $10. Please call after 3pm to explain the bills.

## 2016-03-21 NOTE — Telephone Encounter (Signed)
Pt has $0 balance and $50 no show has been written off.  Left mssg for pt to return call.

## 2016-07-19 DIAGNOSIS — H40003 Preglaucoma, unspecified, bilateral: Secondary | ICD-10-CM | POA: Diagnosis not present

## 2016-07-27 ENCOUNTER — Ambulatory Visit (INDEPENDENT_AMBULATORY_CARE_PROVIDER_SITE_OTHER): Payer: PPO | Admitting: Family Medicine

## 2016-07-27 ENCOUNTER — Encounter: Payer: Self-pay | Admitting: Family Medicine

## 2016-07-27 DIAGNOSIS — L720 Epidermal cyst: Secondary | ICD-10-CM | POA: Insufficient documentation

## 2016-07-27 NOTE — Progress Notes (Signed)
Pre visit review using our clinic review tool, if applicable. No additional management support is needed unless otherwise documented below in the visit note. 

## 2016-07-27 NOTE — Assessment & Plan Note (Signed)
No infection. Treat with warm compress. No indication to aspirate or I and D.  pt reassured.

## 2016-07-27 NOTE — Progress Notes (Signed)
   Subjective:    Patient ID: Jessica Hardin, female    DOB: [birth year], 76 y.o.   MRN: [id number]  HPI    76 year old female presents with new onset cyst on back on her left knuckle.   She first noted the lesion on  left 2nd digit at base of MCP joint dosally.   Area is sore  To touch, no redness, area is getting darker.  No discharge , but has has central  Punctum.  Has ganglion cyst on right dorsal wrist..decreased with time.   BP Readings from Last 3 Encounters:  07/27/16 130/86  01/19/16 124/64  11/04/15 118/76       Review of Systems  Constitutional: Negative for fatigue and fever.  HENT: Negative for ear pain.   Eyes: Negative for pain.  Respiratory: Negative for chest tightness and shortness of breath.   Cardiovascular: Negative for chest pain, palpitations and leg swelling.  Gastrointestinal: Negative for abdominal pain.  Genitourinary: Negative for dysuria.       Objective:   Physical Exam  Constitutional: Vital signs are normal. She appears well-developed and well-nourished. She is cooperative.  Non-toxic appearance. She does not appear ill. No distress.  HENT:  Head: Normocephalic.  Right Ear: Hearing, tympanic membrane, external ear and ear canal normal. Tympanic membrane is not erythematous, not retracted and not bulging.  Left Ear: Hearing, tympanic membrane, external ear and ear canal normal. Tympanic membrane is not erythematous, not retracted and not bulging.  Nose: No mucosal edema or rhinorrhea. Right sinus exhibits no maxillary sinus tenderness and no frontal sinus tenderness. Left sinus exhibits no maxillary sinus tenderness and no frontal sinus tenderness.  Mouth/Throat: Uvula is midline, oropharynx is clear and moist and mucous membranes are normal.  Eyes: Conjunctivae, EOM and lids are normal. Pupils are equal, round, and reactive to light. Lids are everted and swept, no foreign bodies found.  Neck: Trachea normal and normal range of motion. Neck  supple. Carotid bruit is not present. No thyroid mass and no thyromegaly present.  Cardiovascular: Normal rate, regular rhythm, S1 normal, S2 normal, normal heart sounds, intact distal pulses and normal pulses.  Exam reveals no gallop and no friction rub.   No murmur heard. Pulmonary/Chest: Effort normal and breath sounds normal. No tachypnea. No respiratory distress. She has no decreased breath sounds. She has no wheezes. She has no rhonchi. She has no rales.  Abdominal: Soft. Normal appearance and bowel sounds are normal. There is no tenderness.  Neurological: She is alert.  Skin: Skin is warm, dry and intact. No rash noted.  Firm nodule with center punctum over left base of 2nd finger, not assopicated with joint.. Does not transilluminate.   no joint swelling, redness or pain.  Full ROM of fingers.  Psychiatric: Her speech is normal and behavior is normal. Judgment and thought content normal. Her mood appears not anxious. Cognition and memory are normal. She does not exhibit a depressed mood.          Assessment & Plan:

## 2016-07-27 NOTE — Patient Instructions (Signed)
Likely epidermoid cyst.  No current infection.  Apply warm compresses.. It will resolve with time.  Keep covered to avoid hitting it.

## 2016-08-18 ENCOUNTER — Ambulatory Visit (INDEPENDENT_AMBULATORY_CARE_PROVIDER_SITE_OTHER): Payer: PPO | Admitting: Family Medicine

## 2016-08-18 ENCOUNTER — Encounter: Payer: Self-pay | Admitting: Family Medicine

## 2016-08-18 DIAGNOSIS — L989 Disorder of the skin and subcutaneous tissue, unspecified: Secondary | ICD-10-CM | POA: Insufficient documentation

## 2016-08-18 NOTE — Progress Notes (Signed)
Pre visit review using our clinic review tool, if applicable. No additional management support is needed unless otherwise documented below in the visit note. 

## 2016-08-18 NOTE — Patient Instructions (Signed)
Please stop at the front desk to set up referral.  

## 2016-08-18 NOTE — Assessment & Plan Note (Signed)
Epidermoid cyst versus  Other skin lesion.  No sign of infection, no boil.  Refer to derm or shave biopsy given location on hand.

## 2016-08-18 NOTE — Progress Notes (Signed)
   Subjective:    Patient ID: Jessica Hardin, female    DOB: [birth year], 75 y.o.   MRN: [id number]  HPI  76 year old female presents for follow up of epidermoid cyst/ skin lesion on left knuckle.  She was seen for similar 1 month ago.  She reports today that  The area in bigger and more tender.  No dishcarge, no redness, darker around lesion.  No flu like symptoms, no fever. No pain in finger, no swelling in joints.    no other areas of skin change.   Has been applying white potato and  Warm compresses.. No change.  Review of Systems  Constitutional: Negative for fatigue and fever.  HENT: Negative for ear pain.   Eyes: Negative for pain.  Respiratory: Negative for chest tightness and shortness of breath.   Cardiovascular: Negative for chest pain, palpitations and leg swelling.  Gastrointestinal: Negative for abdominal pain.  Genitourinary: Negative for dysuria.       Objective:   Physical Exam  Constitutional: Vital signs are normal. She appears well-developed and well-nourished. She is cooperative.  Non-toxic appearance. She does not appear ill. No distress.  HENT:  Head: Normocephalic.  Right Ear: Hearing, tympanic membrane, external ear and ear canal normal. Tympanic membrane is not erythematous, not retracted and not bulging.  Left Ear: Hearing, tympanic membrane, external ear and ear canal normal. Tympanic membrane is not erythematous, not retracted and not bulging.  Nose: No mucosal edema or rhinorrhea. Right sinus exhibits no maxillary sinus tenderness and no frontal sinus tenderness. Left sinus exhibits no maxillary sinus tenderness and no frontal sinus tenderness.  Mouth/Throat: Uvula is midline, oropharynx is clear and moist and mucous membranes are normal.  Eyes: Conjunctivae, EOM and lids are normal. Pupils are equal, round, and reactive to light. Lids are everted and swept, no foreign bodies found.  Neck: Trachea normal and normal range of motion. Neck supple.  Carotid bruit is not present. No thyroid mass and no thyromegaly present.  Cardiovascular: Normal rate, regular rhythm, S1 normal, S2 normal, normal heart sounds, intact distal pulses and normal pulses.  Exam reveals no gallop and no friction rub.   No murmur heard. Pulmonary/Chest: Effort normal and breath sounds normal. No tachypnea. No respiratory distress. She has no decreased breath sounds. She has no wheezes. She has no rhonchi. She has no rales.  Abdominal: Soft. Normal appearance and bowel sounds are normal. There is no tenderness.  Neurological: She is alert.  Skin: Skin is warm, dry and intact. No rash noted.  Firm nodule with center punctum over left base of 2nd finger, not assopicated with joint.. Does not transilluminate.   no joint swelling, redness or pain.  Full ROM of fingers.  Psychiatric: Her speech is normal and behavior is normal. Judgment and thought content normal. Her mood appears not anxious. Cognition and memory are normal. She does not exhibit a depressed mood.          Assessment & Plan:

## 2016-08-29 DIAGNOSIS — D485 Neoplasm of uncertain behavior of skin: Secondary | ICD-10-CM | POA: Diagnosis not present

## 2016-08-29 DIAGNOSIS — B078 Other viral warts: Secondary | ICD-10-CM | POA: Diagnosis not present

## 2016-09-04 DIAGNOSIS — H40003 Preglaucoma, unspecified, bilateral: Secondary | ICD-10-CM | POA: Diagnosis not present

## 2016-09-06 DIAGNOSIS — B079 Viral wart, unspecified: Secondary | ICD-10-CM | POA: Diagnosis not present

## 2016-09-28 ENCOUNTER — Telehealth: Payer: Self-pay | Admitting: Family Medicine

## 2016-09-28 NOTE — Telephone Encounter (Signed)
Pt has appt with Dr Diona Browner on 09/29/16 at 9:30.

## 2016-09-28 NOTE — Telephone Encounter (Signed)
Patient Name: Jessica Hardin DOB: 1941/05/24 Initial Comment Caller states hasn't had bm in 8 days, Dulcolax not helping, starting to cramp Nurse Assessment Nurse: Chesley Noon, RN, Lattie Haw Date/Time Eilene Ghazi Time): 09/28/2016 9:38:01 AM Confirm and document reason for call. If symptomatic, describe symptoms. ---Caller states she has not had BM in 8 days and dulcolax is not helping. She is cramping now. Dulcolax x 2 days. Does the patient have any new or worsening symptoms? ---Yes Will a triage be completed? ---Yes Related visit to physician within the last 2 weeks? ---No Does the PT have any chronic conditions? (i.e. diabetes, asthma, etc.) ---No Is this a behavioral health or substance abuse call? ---No Guidelines Guideline Title Affirmed Question Affirmed Notes Constipation Abdomen is more swollen than usual Final Disposition User See Physician within 24 Hours Chesley Noon, RN, Lattie Haw Comments Appt scheduled for tomorrow 4/13 at 9:30am with Dr. Diona Browner. Referrals REFERRED TO PCP OFFICE Disagree/Comply: Comply

## 2016-09-29 ENCOUNTER — Ambulatory Visit: Payer: PPO | Admitting: Family Medicine

## 2016-09-29 NOTE — Telephone Encounter (Signed)
Spoke with Ms. Huyser.  She was unable to keep her appointment today because she already had another appointment with her Chiropractor.  She states she is about the same.  Appointment rescheduled for 10/03/2016 at 3 :45 pm with Dr. Diona Browner.

## 2016-09-29 NOTE — Telephone Encounter (Signed)
Call pt for update.. Not seen this AM.

## 2016-10-03 ENCOUNTER — Telehealth: Payer: Self-pay | Admitting: Family Medicine

## 2016-10-03 ENCOUNTER — Ambulatory Visit: Payer: PPO | Admitting: Family Medicine

## 2016-10-03 DIAGNOSIS — B079 Viral wart, unspecified: Secondary | ICD-10-CM | POA: Diagnosis not present

## 2016-10-03 NOTE — Telephone Encounter (Signed)
Pt did not show up for their acute visit.  Do you want to charge No Show Fee? °

## 2016-10-03 NOTE — Telephone Encounter (Signed)
Change.. Second no show/cancel,09/29/16 and 10/03/2016.  Do not reschedule unless pt calls.

## 2016-10-04 ENCOUNTER — Ambulatory Visit (INDEPENDENT_AMBULATORY_CARE_PROVIDER_SITE_OTHER): Payer: PPO | Admitting: Psychology

## 2016-10-04 DIAGNOSIS — F4323 Adjustment disorder with mixed anxiety and depressed mood: Secondary | ICD-10-CM | POA: Diagnosis not present

## 2016-10-04 DIAGNOSIS — R419 Unspecified symptoms and signs involving cognitive functions and awareness: Secondary | ICD-10-CM

## 2016-10-05 ENCOUNTER — Ambulatory Visit: Payer: PPO | Admitting: Family Medicine

## 2016-10-05 ENCOUNTER — Encounter: Payer: Self-pay | Admitting: Family Medicine

## 2016-10-05 ENCOUNTER — Ambulatory Visit (INDEPENDENT_AMBULATORY_CARE_PROVIDER_SITE_OTHER): Payer: PPO | Admitting: Family Medicine

## 2016-10-05 DIAGNOSIS — H1010 Acute atopic conjunctivitis, unspecified eye: Secondary | ICD-10-CM

## 2016-10-05 DIAGNOSIS — J309 Allergic rhinitis, unspecified: Secondary | ICD-10-CM

## 2016-10-05 DIAGNOSIS — K5909 Other constipation: Secondary | ICD-10-CM | POA: Diagnosis not present

## 2016-10-05 NOTE — Progress Notes (Signed)
Pre visit review using our clinic review tool, if applicable. No additional management support is needed unless otherwise documented below in the visit note. 

## 2016-10-05 NOTE — Patient Instructions (Addendum)
Start zyrtec at bedtime.  Stop benadryl.  Start fluticasone 2 sprays per nostril daily.   Start benefiber daily to increase fiber.  Increase water in diet. Start miralax daily to as needed for constipation.   Call in 1-2 week if issues are not improving.  Make sure to have colonoscopy in 03/2017.

## 2016-10-05 NOTE — Assessment & Plan Note (Addendum)
Inadequate control.  increase fiber and water, increase exercsie.  Start miralax daily.  Brother with colon cancer.. Pt with no red flags. Due for colonoscopy in 03/2017.

## 2016-10-05 NOTE — Assessment & Plan Note (Signed)
Stop benadryl as may be contributing to constipation. Start oral antihistamine and nasla steroid spray. If eye symptoms not improving.Marland Kitchen She will call back for antihistamine eye drops.

## 2016-10-05 NOTE — Progress Notes (Signed)
   Subjective:    Patient ID: Jessica Hardin, female    DOB: 1940/12/25, 76 y.o.   MRN: [id number]  HPI    76 year old female presents for constipation and allergies   1. Constipation, chronic:  Off and on for years. She has tried ducolax. Minimal effect. Small amount each time. Last BM 3 days but only a small amount. Straining to get it out.  Eating veggies daily. Not drinking as much water. No abd pain. No blood in stool.  Colonoscopy: [facility] 03/2007  brother with colon cancer  2. Allergies, runny nose, eye and nose itching, watery eyes. She is currently using benadryl and flonase prn allergies without   Review of Systems  Constitutional: Negative for fatigue and fever.  HENT: Positive for postnasal drip, rhinorrhea and sneezing. Negative for ear pain.   Eyes: Positive for redness and itching. Negative for pain.  Respiratory: Negative for chest tightness and shortness of breath.   Cardiovascular: Negative for chest pain, palpitations and leg swelling.  Gastrointestinal: Positive for constipation. Negative for abdominal pain.  Genitourinary: Negative for dysuria.       Objective:   Physical Exam  Constitutional: Vital signs are normal. She appears well-developed and well-nourished. She is cooperative.  Non-toxic appearance. She does not appear ill. No distress.  HENT:  Head: Normocephalic.  Right Ear: Hearing, tympanic membrane, external ear and ear canal normal. Tympanic membrane is not erythematous, not retracted and not bulging. No middle ear effusion.  Left Ear: Hearing, tympanic membrane, external ear and ear canal normal. Tympanic membrane is not erythematous, not retracted and not bulging.  No middle ear effusion.  Nose: Mucosal edema and rhinorrhea present. Right sinus exhibits no maxillary sinus tenderness and no frontal sinus tenderness. Left sinus exhibits no maxillary sinus tenderness and no frontal sinus tenderness.  Mouth/Throat: Uvula is midline and mucous  membranes are normal. Posterior oropharyngeal erythema present. No oropharyngeal exudate or posterior oropharyngeal edema.  Pale nasal turbinates  Eyes: Conjunctivae, EOM and lids are normal. Pupils are equal, round, and reactive to light. Lids are everted and swept, no foreign bodies found.  Neck: Trachea normal and normal range of motion. Neck supple. Carotid bruit is not present. No thyroid mass and no thyromegaly present.  Cardiovascular: Normal rate, regular rhythm, S1 normal, S2 normal, normal heart sounds, intact distal pulses and normal pulses.  Exam reveals no gallop and no friction rub.   No murmur heard. Pulmonary/Chest: Effort normal and breath sounds normal. No tachypnea. No respiratory distress. She has no decreased breath sounds. She has no wheezes. She has no rhonchi. She has no rales.  Abdominal: Soft. Normal appearance and bowel sounds are normal. There is no hepatosplenomegaly. There is no tenderness. There is no CVA tenderness. No hernia. Hernia confirmed negative in the ventral area.  Neurological: She is alert.  Skin: Skin is warm, dry and intact. No rash noted.  Psychiatric: Her speech is normal and behavior is normal. Judgment and thought content normal. Her mood appears not anxious. Cognition and memory are normal. She does not exhibit a depressed mood.          Assessment & Plan:

## 2016-10-06 ENCOUNTER — Telehealth: Payer: Self-pay

## 2016-10-06 NOTE — Telephone Encounter (Signed)
Pt called to verify what meds pt is to get from [facility]; advised per [facility] and pt voiced understanding.

## 2016-12-15 DIAGNOSIS — B078 Other viral warts: Secondary | ICD-10-CM | POA: Diagnosis not present

## 2016-12-26 DIAGNOSIS — B078 Other viral warts: Secondary | ICD-10-CM | POA: Diagnosis not present

## 2016-12-26 DIAGNOSIS — L853 Xerosis cutis: Secondary | ICD-10-CM | POA: Diagnosis not present

## 2016-12-26 DIAGNOSIS — D485 Neoplasm of uncertain behavior of skin: Secondary | ICD-10-CM | POA: Diagnosis not present

## 2016-12-26 DIAGNOSIS — B079 Viral wart, unspecified: Secondary | ICD-10-CM | POA: Diagnosis not present

## 2017-01-23 DIAGNOSIS — B078 Other viral warts: Secondary | ICD-10-CM | POA: Diagnosis not present

## 2017-01-23 DIAGNOSIS — L853 Xerosis cutis: Secondary | ICD-10-CM | POA: Diagnosis not present

## 2017-01-30 ENCOUNTER — Encounter: Payer: Self-pay | Admitting: Family Medicine

## 2017-01-30 ENCOUNTER — Ambulatory Visit (INDEPENDENT_AMBULATORY_CARE_PROVIDER_SITE_OTHER): Payer: PPO | Admitting: Family Medicine

## 2017-01-30 VITALS — BP 118/64 | HR 66 | Temp 97.3°F | Ht 58.5 in | Wt 128.8 lb

## 2017-01-30 DIAGNOSIS — K5909 Other constipation: Secondary | ICD-10-CM

## 2017-01-30 DIAGNOSIS — R413 Other amnesia: Secondary | ICD-10-CM

## 2017-01-30 DIAGNOSIS — Z0000 Encounter for general adult medical examination without abnormal findings: Secondary | ICD-10-CM

## 2017-01-30 DIAGNOSIS — Z13220 Encounter for screening for lipoid disorders: Secondary | ICD-10-CM

## 2017-01-30 DIAGNOSIS — F341 Dysthymic disorder: Secondary | ICD-10-CM

## 2017-01-30 NOTE — Assessment & Plan Note (Signed)
May be affecting her memory , but pt feel the opposite is true.. She is depressed abpout her memory changes.

## 2017-01-30 NOTE — Progress Notes (Signed)
Subjective:    Patient ID: Jessica Hardin, female    DOB: [birth year], 76 y.o.   MRN: [id number]  HPI   The patient presents for annual medicare wellness, complete physical and review of chronic health problems. He/She also has the following acute concerns today: many warts appearing. Seeing Derm. Treating with compound W, cryotherapy and  Biopsy sent  She also recently had itchy rash.. She is treated with triamcinolone cream.. Improving.  I have personally reviewed the Medicare Annual Wellness questionnaire and have noted 1. The patient's medical and social history 2. Their use of alcohol, tobacco or illicit drugs 3. Their current medications and supplements 4. The patient's functional ability including ADL's, fall risks, home safety risks and hearing or visual             impairment. 5. Diet and physical activities 6. Evidence for depression or mood disorders 7.         Updated provider list Cognitive evaluation was performed and recorded on pt medicare questionnaire form. The patients weight, height, BMI and visual acuity have been recorded in the chart  I have made referrals, counseling and provided education to the patient based review of the above and I have provided the pt with a written personalized care plan for preventive services.   Documentation of this information was scanned into the electronic record under the media tab.   Skin changes: Followed by Payton Mccallum.. See above. Has had 2 bx verified lesions  New evidence of depressive symptoms in last year, worse in last several months  PHQ9 10.  She is seeing a Social worker for mood.    She feels her mood is do to her memory issues and feeling about it.  She is only getting 4-5 hours of sleep night.  Has trouble falling asleep.   Her memory has been worsening as well. First noted  In last 2 year.  MMSE 30/30 in 2016  Neg eval with labs.  No family history of memory issues.  No benefit to prevagen, alos taking OTC memory  supplement.  Chronic constipation: off and on.   Occ using polyethylene glycol.  Exercise: minimal  Diet: moderate  Body mass index is 26.46 kg/m.    Social History /Family History/Past Medical History reviewed in detail and updated in EMR if needed. Blood pressure 118/64, pulse 66, temperature (!) 97.3 F (36.3 C), temperature source Oral, height 4' 10.5" (1.486 m), weight 128 lb 12.8 oz (58.4 kg), SpO2 97 %.   Advance directives and end of life planning reviewed in detail with patient and documented in EMR. Patient given handout on advance care directives if needed. HCPOA and living will updated if needed.  Fall Risk  01/30/2017 11/04/2015  Falls in the past year? No No   Depression screen [facility] 2/9 01/30/2017 11/04/2015 03/05/2015 09/08/2014  Decreased Interest 1 0 0 0  Down, Depressed, Hopeless 2 0 1 1  PHQ - 2 Score 3 0 1 1  Altered sleeping 3 - - -  Tired, decreased energy 1 - - -  Change in appetite 0 - - -  Feeling bad or failure about yourself  0 - - -  Trouble concentrating 1 - - -  Moving slowly or fidgety/restless 2 - - -  Suicidal thoughts 0 - - -  PHQ-9 Score 10 - - -    Review of Systems  Constitutional: Negative for fatigue and fever.  HENT: Negative for congestion.   Eyes: Negative for pain.  Respiratory: Negative  for cough and shortness of breath.   Cardiovascular: Negative for chest pain, palpitations and leg swelling.  Gastrointestinal: Negative for abdominal pain.  Genitourinary: Negative for dysuria and vaginal bleeding.  Musculoskeletal: Negative for back pain.  Neurological: Positive for light-headedness. Negative for syncope and headaches.  Psychiatric/Behavioral: Negative for dysphoric mood.       Objective:   Physical Exam  Constitutional: Vital signs are normal. She appears well-developed and well-nourished. She is cooperative.  Non-toxic appearance. She does not appear ill. No distress.  HENT:  Head: Normocephalic.  Right Ear: Hearing,  tympanic membrane, external ear and ear canal normal.  Left Ear: Hearing, tympanic membrane, external ear and ear canal normal.  Nose: Nose normal.  Eyes: Pupils are equal, round, and reactive to light. Conjunctivae, EOM and lids are normal. Lids are everted and swept, no foreign bodies found.  Neck: Trachea normal and normal range of motion. Neck supple. Carotid bruit is not present. No thyroid mass and no thyromegaly present.  Cardiovascular: Normal rate, regular rhythm, S1 normal, S2 normal, normal heart sounds and intact distal pulses.  Exam reveals no gallop.   No murmur heard. Pulmonary/Chest: Effort normal and breath sounds normal. No respiratory distress. She has no wheezes. She has no rhonchi. She has no rales.  Abdominal: Soft. Normal appearance and bowel sounds are normal. She exhibits no distension, no fluid wave, no abdominal bruit and no mass. There is no hepatosplenomegaly. There is no tenderness. There is no rebound, no guarding and no CVA tenderness. No hernia.  Lymphadenopathy:    She has no cervical adenopathy.    She has no axillary adenopathy.  Neurological: She is alert. She has normal strength. No cranial nerve deficit or sensory deficit.  Skin: Skin is warm, dry and intact. No rash noted.  Psychiatric: Her speech is normal and behavior is normal. Judgment normal. Her mood appears not anxious. Cognition and memory are normal. She does not exhibit a depressed mood.          Assessment & Plan:  The patient's preventative maintenance and recommended screening tests for an annual wellness exam were reviewed in full today. Brought up to date unless services declined.  Counselled on the importance of diet, exercise, and its role in overall health and mortality. The patient's FH and SH was reviewed, including their home life, tobacco status, and drug and alcohol status.   Last mammo 01/2014,  Sister with breast cancer, she is not interested in further  mammogram Colonoscopy: colonoscopy nml 2008 Dr. Tiffany Kocher. Repeat was due in 5 years. Since then saw Dr. Delano Metz, [facility]. 2009 sigmoid nml! Brother with colon cancer. She is not interested in repeating at this time. PAP/DVE: TAH, not indicated DXA: 11/2015 osteopenia, repeat in 5 years Nonsmoker

## 2017-01-30 NOTE — Assessment & Plan Note (Signed)
Stable control on current regimen. 

## 2017-01-30 NOTE — Patient Instructions (Addendum)
Work on increasing exercise as able. Keep appt with Dr. Rexene Edison to help understand how mood is connected to your memory. Return for fasting labs when able.

## 2017-01-30 NOTE — Assessment & Plan Note (Addendum)
Today MMSE 30/30.  no indication for treatment.  Has appt with psychology to help determine if mood related to her memory.

## 2017-02-01 ENCOUNTER — Other Ambulatory Visit (INDEPENDENT_AMBULATORY_CARE_PROVIDER_SITE_OTHER): Payer: PPO

## 2017-02-01 DIAGNOSIS — R413 Other amnesia: Secondary | ICD-10-CM | POA: Diagnosis not present

## 2017-02-01 DIAGNOSIS — Z13220 Encounter for screening for lipoid disorders: Secondary | ICD-10-CM | POA: Diagnosis not present

## 2017-02-01 LAB — COMPREHENSIVE METABOLIC PANEL
ALK PHOS: 58 U/L (ref 39–117)
ALT: 11 U/L (ref 0–35)
AST: 19 U/L (ref 0–37)
Albumin: 3.8 g/dL (ref 3.5–5.2)
BILIRUBIN TOTAL: 0.5 mg/dL (ref 0.2–1.2)
BUN: 12 mg/dL (ref 6–23)
CO2: 33 meq/L — AB (ref 19–32)
Calcium: 9.4 mg/dL (ref 8.4–10.5)
Chloride: 104 mEq/L (ref 96–112)
Creatinine, Ser: 0.92 mg/dL (ref 0.40–1.20)
GFR: 76.05 mL/min (ref >60.00)
GLUCOSE: 84 mg/dL (ref 70–99)
Potassium: 3.6 mEq/L (ref 3.5–5.1)
SODIUM: 141 meq/L (ref 135–145)
TOTAL PROTEIN: 6.8 g/dL (ref 6.0–8.3)

## 2017-02-01 LAB — TSH: TSH: 0.96 u[IU]/mL (ref 0.35–4.50)

## 2017-02-01 LAB — CBC WITH DIFFERENTIAL/PLATELET
BASOS ABS: 0 10*3/uL (ref 0.0–0.1)
Basophils Relative: 0.5 % (ref 0.0–3.0)
EOS PCT: 0.7 % (ref 0.0–5.0)
Eosinophils Absolute: 0 10*3/uL (ref 0.0–0.7)
HEMATOCRIT: 40.5 % (ref 36.0–46.0)
Hemoglobin: 13 g/dL (ref 12.0–15.0)
LYMPHS ABS: 1.9 10*3/uL (ref 0.7–4.0)
LYMPHS PCT: 46.6 % — AB (ref 12.0–46.0)
MCHC: 32.1 g/dL (ref 30.0–36.0)
MCV: 92.9 fl (ref 78.0–100.0)
MONOS PCT: 6.4 % (ref 3.0–12.0)
Monocytes Absolute: 0.3 10*3/uL (ref 0.1–1.0)
NEUTROS ABS: 1.9 10*3/uL (ref 1.4–7.7)
NEUTROS PCT: 45.8 % (ref 43.0–77.0)
Platelets: 200 10*3/uL (ref 150.0–400.0)
RBC: 4.36 Mil/uL (ref 3.87–5.11)
RDW: 13.5 % (ref 11.5–15.5)
WBC: 4.2 10*3/uL (ref 4.0–10.5)

## 2017-02-01 LAB — VITAMIN B12: VITAMIN B 12: 741 pg/mL (ref 211–911)

## 2017-02-01 LAB — T3, FREE: T3 FREE: 2.8 pg/mL (ref 2.3–4.2)

## 2017-02-01 LAB — LIPID PANEL
CHOL/HDL RATIO: 3
Cholesterol: 199 mg/dL (ref 0–200)
HDL: 78.2 mg/dL (ref >39.00)
LDL Cholesterol: 111 mg/dL — ABNORMAL HIGH (ref 0–99)
NONHDL: 120.78
Triglycerides: 51 mg/dL (ref 0.0–149.0)
VLDL: 10.2 mg/dL (ref 0.0–40.0)

## 2017-02-01 LAB — VITAMIN D 25 HYDROXY (VIT D DEFICIENCY, FRACTURES): VITD: 32.44 ng/mL (ref 30.00–100.00)

## 2017-02-01 LAB — T4, FREE: Free T4: 0.71 ng/dL (ref 0.60–1.60)

## 2017-02-05 ENCOUNTER — Ambulatory Visit (INDEPENDENT_AMBULATORY_CARE_PROVIDER_SITE_OTHER): Payer: PPO | Admitting: Psychology

## 2017-02-05 DIAGNOSIS — F4323 Adjustment disorder with mixed anxiety and depressed mood: Secondary | ICD-10-CM | POA: Diagnosis not present

## 2017-02-28 DIAGNOSIS — B078 Other viral warts: Secondary | ICD-10-CM | POA: Diagnosis not present

## 2017-03-05 DIAGNOSIS — H40003 Preglaucoma, unspecified, bilateral: Secondary | ICD-10-CM | POA: Diagnosis not present

## 2017-03-29 DIAGNOSIS — B078 Other viral warts: Secondary | ICD-10-CM | POA: Diagnosis not present

## 2017-04-26 DIAGNOSIS — B078 Other viral warts: Secondary | ICD-10-CM | POA: Diagnosis not present

## 2017-04-27 ENCOUNTER — Emergency Department: Payer: PPO

## 2017-04-27 ENCOUNTER — Emergency Department
Admission: EM | Admit: 2017-04-27 | Discharge: 2017-04-28 | Disposition: A | Discharge status: Home / Self Care | Payer: PPO | Attending: Student in an Organized Health Care Education/Training Program | Admitting: Student in an Organized Health Care Education/Training Program

## 2017-04-27 ENCOUNTER — Other Ambulatory Visit: Payer: Self-pay

## 2017-04-27 ENCOUNTER — Encounter: Payer: Self-pay | Admitting: *Deleted

## 2017-04-27 DIAGNOSIS — R0602 Shortness of breath: Secondary | ICD-10-CM

## 2017-04-27 DIAGNOSIS — J45909 Unspecified asthma, uncomplicated: Secondary | ICD-10-CM | POA: Insufficient documentation

## 2017-04-27 DIAGNOSIS — R0789 Other chest pain: Secondary | ICD-10-CM

## 2017-04-27 DIAGNOSIS — R079 Chest pain, unspecified: Secondary | ICD-10-CM | POA: Diagnosis not present

## 2017-04-27 DIAGNOSIS — Z79899 Other long term (current) drug therapy: Secondary | ICD-10-CM | POA: Insufficient documentation

## 2017-04-27 LAB — CBC
HCT: 40.3 % (ref 35.0–47.0)
HEMOGLOBIN: 13.1 g/dL (ref 12.0–16.0)
MCH: 29.8 pg (ref 26.0–34.0)
MCHC: 32.4 g/dL (ref 32.0–36.0)
MCV: 92.1 fL (ref 80.0–100.0)
PLATELETS: 174 10*3/uL (ref 150–440)
RBC: 4.38 MIL/uL (ref 3.80–5.20)
RDW: 13.3 % (ref 11.5–14.5)
WBC: 3.4 10*3/uL — ABNORMAL LOW (ref 3.6–11.0)

## 2017-04-27 LAB — BASIC METABOLIC PANEL
ANION GAP: 10 (ref 5–15)
BUN: 19 mg/dL (ref 6–20)
CALCIUM: 9.1 mg/dL (ref 8.9–10.3)
CO2: 27 mmol/L (ref 22–32)
CREATININE: 0.94 mg/dL (ref 0.44–1.00)
Chloride: 102 mmol/L (ref 101–111)
GFR calc Af Amer: >60 mL/min (ref >60)
GFR, EST NON AFRICAN AMERICAN: 57 mL/min — AB (ref >60)
GLUCOSE: 111 mg/dL — AB (ref 65–99)
Potassium: 3.3 mmol/L — ABNORMAL LOW (ref 3.5–5.1)
Sodium: 139 mmol/L (ref 135–145)

## 2017-04-27 LAB — TROPONIN I

## 2017-04-27 MED ORDER — IPRATROPIUM-ALBUTEROL 0.5-2.5 (3) MG/3ML IN SOLN: 3.0000 mL | Freq: Once | RESPIRATORY_TRACT | Status: DC

## 2017-04-27 MED ORDER — AMOXICILLIN 500 MG PO CAPS: 500.0000 mg | ORAL_CAPSULE | Freq: Three times a day (TID) | ORAL | 0 refills | Status: DC

## 2017-04-27 MED ORDER — PREDNISONE 20 MG PO TABS: 40.0000 mg | ORAL_TABLET | Freq: Every day | ORAL | 0 refills | Status: AC

## 2017-04-27 MED ORDER — IPRATROPIUM-ALBUTEROL 0.5-2.5 (3) MG/3ML IN SOLN
3.0000 mL | Freq: Once | RESPIRATORY_TRACT | Status: AC
  Administered 2017-04-27: 3 mL via RESPIRATORY_TRACT
  Filled 2017-04-27: qty 3

## 2017-04-27 MED ORDER — ALBUTEROL SULFATE HFA 108 (90 BASE) MCG/ACT IN AERS: 2.0000 | INHALATION_SPRAY | Freq: Four times a day (QID) | RESPIRATORY_TRACT | 2 refills | Status: DC | PRN

## 2017-04-27 MED ORDER — AMOXICILLIN 500 MG PO CAPS
500.0000 mg | ORAL_CAPSULE | Freq: Once | ORAL | Status: AC
  Administered 2017-04-28: 500 mg via ORAL
  Filled 2017-04-27: qty 1

## 2017-04-27 MED ORDER — ALBUTEROL SULFATE (2.5 MG/3ML) 0.083% IN NEBU
5.0000 mg | INHALATION_SOLUTION | Freq: Once | RESPIRATORY_TRACT | Status: AC
  Administered 2017-04-27: 5 mg via RESPIRATORY_TRACT
  Filled 2017-04-27: qty 6

## 2017-04-27 NOTE — ED Triage Notes (Signed)
Pt to ED reporting sudden onset of SOB and chest pain while working out this evening. Pt reports she has been congested and had an episode of coughing with white thick sputum before SOB and CP began. No hx of heart problems reported. 5/10 pain and nausea at this time.

## 2017-04-27 NOTE — ED Provider Notes (Signed)
[facility] Emergency Department Provider Note    First MD Initiated Contact with Patient 04/27/17 2122     (approximate)  I have reviewed the triage vital signs and the nursing notes.   HISTORY  Chief Complaint Chest Pain and Shortness of Breath    HPI Jessica Hardin is a 76 y.o. female presents with sudden onset shortness of breath after working out associated with sneezing and coughing white thick sputum.  Does have a history of asthma.  Does have sinus congestion.  Denies any chest pressure.  No pain radiating to her jaw or neck.  No pain radiating through to her back.  Did get her flu shot this year.  Is not been on any steroids recently.  Does not take any breathing treatments at home.  She was able to get through her work out without any discomfort.  Previous history of heart attacks.  Past Medical History:  Diagnosis Date  . Allergy   . Asthma   . GERD (gastroesophageal reflux disease)   . History of chicken pox   . IBS (irritable bowel syndrome)   . Migraine   . Urinary tract infection    Family History  Problem Relation Age of Onset  . Arthritis Mother   . Hypertension Mother   . Arthritis Sister   . Hypertension Sister   . Colon cancer Brother 80  . Breast cancer Sister   . Breast cancer Sister   . Uterine cancer Sister 23   Past Surgical History:  Procedure Laterality Date  . ABDOMINAL HYSTERECTOMY    . CATARACT EXTRACTION, BILATERAL    . TUBAL LIGATION     Patient Active Problem List   Diagnosis Date Noted  . Dysthymia 01/30/2017  . 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 Epidermoid cyst of finger of left hand 07/27/2016  . Osteopenia 11/18/2015  . Chronic constipation 07/20/2015  . Internal hemorrhoid, bleeding 07/20/2015  . 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 Counseling regarding end of life decision making 09/08/2014  . Childhood asthma 08/13/2014  . Gastroesophageal reflux disease without esophagitis 08/13/2014  .  Irritable bowel syndrome with constipation 08/13/2014  . Hx of migraines 08/13/2014  . Family history of colon cancer 08/13/2014  . Family history of uterine cancer 08/13/2014  . Family history of breast cancer 08/13/2014      Prior to Admission medications   Medication Sig Start Date End Date Taking? Authorizing Provider  albuterol (PROVENTIL HFA;VENTOLIN HFA) 108 (90 Base) MCG/ACT inhaler Inhale 2 puffs every 6 (six) hours as needed into the lungs for wheezing or shortness of breath. 04/27/17   Merlyn Lot, MD  amoxicillin (AMOXIL) 500 MG capsule Take 1 capsule (500 mg total) 3 (three) times daily for 7 days by mouth. 04/27/17 05/04/17  Merlyn Lot, MD  Apoaequorin (PREVAGEN PO) Take by mouth.    [provider]  DANDELION PO Take by mouth daily.    [provider]  fluticasone (FLONASE) 50 MCG/ACT nasal spray Place 2 sprays into both nostrils daily as needed. Reported on 09/08/2015 09/08/15   Tonia Ghent, MD  levocetirizine (XYZAL) 5 MG tablet Take 1 tablet (5 mg total) by mouth every evening. Patient not taking: Reported on 01/30/2017 10/14/14   Jinny Sanders, MD  Multiple Vitamin (MULTIVITAMIN) capsule Take 1 capsule by mouth daily.     [provider]  pantoprazole (PROTONIX) 40 MG tablet Take 1 tablet (40 mg total) by mouth daily. Patient not taking: Reported on 01/30/2017 01/19/16  Bedsole, Amy E, MD  polyethylene glycol (MIRALAX / GLYCOLAX) packet Take 17 g by mouth daily as needed for mild constipation.    [provider]  predniSONE (DELTASONE) 20 MG tablet Take 2 tablets (40 mg total) daily for 5 days by mouth. 04/27/17 05/02/17  Merlyn Lot, MD  triamcinolone cream (KENALOG) 0.1 % Apply 1 application topically 2 (two) times daily.    [provider]  UNABLE TO FIND Med Name: Focus Factor    [provider]    Allergies Shellfish allergy    Social History Social History   Tobacco Use  . Smoking  status: Never Smoker  . Smokeless tobacco: Never Used  Substance Use Topics  . Alcohol use: No    Alcohol/week: 0.0 oz  . Drug use: No    Review of Systems Patient denies headaches, rhinorrhea, blurry vision, numbness, shortness of breath, chest pain, edema, cough, abdominal pain, nausea, vomiting, diarrhea, dysuria, fevers, rashes or hallucinations unless otherwise stated above in HPI. ____________________________________________   PHYSICAL EXAM:  VITAL SIGNS: Vitals:   04/27/17 2028 04/27/17 2130  BP: 126/73 138/71  Pulse: 65 78  Resp: 18   Temp: 99.1 F (37.3 C)   SpO2: 96% 100%    Constitutional: Alert and oriented. Well appearing and in no acute distress. Eyes: Conjunctivae are normal.  Head: Atraumatic. Nose: No congestion/rhinnorhea. Mouth/Throat: Mucous membranes are moist.   Neck: No stridor. Painless ROM.  Cardiovascular: Normal rate, regular rhythm. Grossly normal heart sounds.  Good peripheral circulation.  There is tenderness to palpation in the mid sternum. Respiratory: Normal respiratory effort.  No retractions. Lungs with coarse posterior breath sounds. Gastrointestinal: Soft and nontender. No distention. No abdominal bruits. No CVA tenderness. Genitourinary:  Musculoskeletal: No lower extremity tenderness nor edema.  No joint effusions. Neurologic:  Normal speech and language. No gross focal neurologic deficits are appreciated. No facial droop Skin:  Skin is warm, dry and intact. No rash noted. Psychiatric: Mood and affect are normal. Speech and behavior are normal.  ____________________________________________   LABS (all labs ordered are listed, but only abnormal results are displayed)  Results for orders placed or performed during the hospital encounter of 04/27/17 (from the past 24 hour(s))  Basic metabolic panel     Status: Abnormal   Collection Time: 04/27/17  8:17 PM  Result Value Ref Range   Sodium 139 135 - 145 mmol/L   Potassium 3.3 (L)  3.5 - 5.1 mmol/L   Chloride 102 101 - 111 mmol/L   CO2 27 22 - 32 mmol/L   Glucose, Bld 111 (H) 65 - 99 mg/dL   BUN 19 6 - 20 mg/dL   Creatinine, Ser 0.94 0.44 - 1.00 mg/dL   Calcium 9.1 8.9 - 10.3 mg/dL   GFR calc non Af Amer 57 (L) >60 mL/min   GFR calc Af Amer >60 >60 mL/min   Anion gap 10 5 - 15  CBC     Status: Abnormal   Collection Time: 04/27/17  8:17 PM  Result Value Ref Range   WBC 3.4 (L) 3.6 - 11.0 K/uL   RBC 4.38 3.80 - 5.20 MIL/uL   Hemoglobin 13.1 12.0 - 16.0 g/dL   HCT 40.3 35.0 - 47.0 %   MCV 92.1 80.0 - 100.0 fL   MCH 29.8 26.0 - 34.0 pg   MCHC 32.4 32.0 - 36.0 g/dL   RDW 13.3 11.5 - 14.5 %   Platelets 174 150 - 440 K/uL  Troponin I  Status: None   Collection Time: 04/27/17  8:17 PM  Result Value Ref Range   Troponin I <0.03 <0.03 ng/mL  Troponin I     Status: None   Collection Time: 04/27/17 11:06 PM  Result Value Ref Range   Troponin I <0.03 <0.03 ng/mL   ____________________________________________  EKG My review and personal interpretation at Time: 20:17   Indication: chest pain  Rate: 70  Rhythm: sinus Axis: normal Other: no stemi, normal intervals ____________________________________________  RADIOLOGY  I personally reviewed all radiographic images ordered to evaluate for the above acute complaints and reviewed radiology reports and findings.  These findings were personally discussed with the patient.  Please see medical record for radiology report.  ____________________________________________   PROCEDURES  Procedure(s) performed:  Procedures    Critical Care performed: no ____________________________________________   INITIAL IMPRESSION / ASSESSMENT AND PLAN / ED COURSE  Pertinent labs & imaging results that were available during my care of the patient were reviewed by me and considered in my medical decision making (see chart for details).  DDX: Asthma, bronchitis, COPD, ACS, congestive heart failure, pneumonia, viral illness,  unlikely PE or dissection.  Jessica Hardin is a 76 y.o. who presents to the ED with symptoms as described above.  Does have coarse breath sounds on exam and had some significant improvement after nebulizer treatment.  Chest x-ray shows no evidence of pneumonia but there is bronchitic pattern.  EKG shows no ischemia initial troponin is negative and the patient was able to have workout without any pain.  Seems less consistent with ACS but based on her age will repeat troponin for further risk stratification.  Is not clinically consistent with pulmonary embolism or dissection.  The blood work is otherwise reassuring.  Repeat troponin is negative and given her sneezing, congestion coarse breath sounds and chest x-ray showed evidence of bronchitis I do believe that this is most consistent with that pathology I do believe that she is stable for outpatient workup.     ____________________________________________   FINAL CLINICAL IMPRESSION(S) / ED DIAGNOSES  Final diagnoses:  Atypical chest pain  Shortness of breath      NEW MEDICATIONS STARTED DURING THIS VISIT:  This SmartLink is deprecated. Use AVSMEDLIST instead to display the medication list for a patient.   Note:  This document was prepared using Dragon voice recognition software and may include unintentional dictation errors.    Merlyn Lot, MD 04/27/17 2348

## 2017-04-27 NOTE — ED Notes (Signed)
Pt to the er for midsternal chest pain after some exercising,. Pt also had nausea, and sneezing and pt had trouble breathing. Shortness of breath has decreased. Pt says pain has improved. Pt has cough with productive clear mucus.

## 2017-04-28 NOTE — ED Notes (Signed)
Pt given water and crackers to put on her stomach with the antibiotic.

## 2017-04-28 NOTE — ED Notes (Signed)
Family at bedside. 

## 2017-05-04 ENCOUNTER — Ambulatory Visit: Payer: PPO | Admitting: Family Medicine

## 2017-05-04 ENCOUNTER — Encounter: Payer: Self-pay | Admitting: Family Medicine

## 2017-05-04 ENCOUNTER — Other Ambulatory Visit: Payer: Self-pay

## 2017-05-04 VITALS — BP 130/72 | HR 62 | Temp 98.7°F | Ht 58.5 in | Wt 133.2 lb

## 2017-05-04 DIAGNOSIS — H9311 Tinnitus, right ear: Secondary | ICD-10-CM | POA: Insufficient documentation

## 2017-05-04 DIAGNOSIS — R059 Cough, unspecified: Secondary | ICD-10-CM

## 2017-05-04 DIAGNOSIS — M26621 Arthralgia of right temporomandibular joint: Secondary | ICD-10-CM | POA: Diagnosis not present

## 2017-05-04 DIAGNOSIS — R0982 Postnasal drip: Secondary | ICD-10-CM | POA: Diagnosis not present

## 2017-05-04 DIAGNOSIS — R413 Other amnesia: Secondary | ICD-10-CM | POA: Diagnosis not present

## 2017-05-04 DIAGNOSIS — R05 Cough: Secondary | ICD-10-CM | POA: Diagnosis not present

## 2017-05-04 NOTE — Patient Instructions (Addendum)
Start flonase OTC  2 sprays per nostril for fluid behind right ear and post nasal drip. Please stop at the front desk to set up referral to neuro.  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 No gum, no tough foods. Talk with dentist about any evidence of grinding teeth at night.  Temporomandibular Joint Syndrome Temporomandibular joint (TMJ) syndrome is a condition that affects the joints between your jaw and your skull. The TMJs are located near your ears and allow your jaw to open and close. These joints and the nearby muscles are involved in all movements of the jaw. People with TMJ syndrome have pain in the area of these joints and muscles. Chewing, biting, or other movements of the jaw can be difficult or painful. TMJ syndrome can be caused by various things. In many cases, the condition is mild and goes away within a few weeks. For some people, the condition can become a long-term problem. What are the causes? Possible causes of TMJ syndrome include:  Grinding your teeth or clenching your jaw. Some people do this when they are under stress.  Arthritis.  Injury to the jaw.  Head or neck injury.  Teeth or dentures that are not aligned well.  In some cases, the cause of TMJ syndrome may not be known. What are the signs or symptoms? The most common symptom is an aching pain on the side of the head in the area of the TMJ. Other symptoms may include:  Pain when moving your jaw, such as when chewing or biting.  Being unable to open your jaw all the way.  Making a clicking sound when you open your mouth.  Headache.  Earache.  Neck or shoulder pain.  How is this diagnosed? Diagnosis can usually be made based on your symptoms, your medical history, and a physical exam. Your health care provider may check the range of motion of your jaw. Imaging tests, such as X-rays or an MRI, are sometimes done. You may need to see your dentist to determine if your teeth and jaw are  lined up correctly. How is this treated? TMJ syndrome often goes away on its own. If treatment is needed, the options may include:  Eating soft foods and applying ice or heat.  Medicines to relieve pain or inflammation.  Medicines to relax the muscles.  A splint, bite plate, or mouthpiece to prevent teeth grinding or jaw clenching.  Relaxation techniques or counseling to help reduce stress.  Transcutaneous electrical nerve stimulation (TENS). This helps to relieve pain by applying an electrical current through the skin.  Acupuncture. This is sometimes helpful to relieve pain.  Jaw surgery. This is rarely needed.  Follow these instructions at home:  Take medicines only as directed by your health care provider.  Eat a soft diet if you are having trouble chewing.  Apply ice to the painful area. ? Put ice in a plastic bag. ? Place a towel between your skin and the bag. ? Leave the ice on for 20 minutes, 2-3 times a day.  Apply a warm compress to the painful area as directed.  Massage your jaw area and perform any jaw stretching exercises as recommended by your health care provider.  If you were given a mouthpiece or bite plate, wear it as directed.  Avoid foods that require a lot of chewing. Do not chew gum.  Keep all follow-up visits as directed by your health care provider. This is important. Contact a health  care provider if:  You are having trouble eating.  You have new or worsening symptoms. Get help right away if:  Your jaw locks open or closed. This information is not intended to replace advice given to you by your health care provider. Make sure you discuss any questions you have with your health care provider. Document Released: 02/28/2001 Document Revised: 02/03/2016 Document Reviewed: 01/08/2014 Elsevier Interactive Patient Education  [employer].

## 2017-05-04 NOTE — Progress Notes (Signed)
Subjective:    Patient ID: Jessica Hardin, female    DOB: 1940-06-30, 76 y.o.   MRN: [id number]  HPI    76 year old female presents for follow up ED visit for chest pain and SOB.  HPI from ED copied below:  sudden onset shortness of breath after working out associated with sneezing and coughing white thick sputum.  Does have a history of asthma.  Does have sinus congestion.  Denies any chest pressure.  No pain radiating to her jaw or neck.  No pain radiating through to her back.  Did get her flu shot this year.  Lab eval BMET low potassium, cbc nml , troponin I x 2 neg  EKG NSR, no ST changes  CXR: Central airways thickening suggesting bronchitis   Treated for bronchitis with  Albuterol,  Amoxicillin 500 mg 3 times a day.  Took  Only 3 days of antibiotics given cause ringing in ears and ? Voices in ear.  No cough, no SOB, no fever, does has post nasal drip.  She still has some ringing in right ear, jaw snapping when opening jaw wide.   She has noted some memory decrease in last  2 years.Marland Kitchen MMSE and lab eval negative in past. She requests referral to neurologist.  Review of Systems  Constitutional: Negative for fatigue and fever.  HENT: Negative for congestion and ear pain.   Eyes: Negative for pain.  Respiratory: Negative for cough, chest tightness and shortness of breath.   Cardiovascular: Negative for chest pain, palpitations and leg swelling.  Gastrointestinal: Negative for abdominal pain.  Genitourinary: Negative for dysuria and vaginal bleeding.  Musculoskeletal: Negative for back pain.  Neurological: Negative for syncope, light-headedness and headaches.  Psychiatric/Behavioral: Negative for dysphoric mood.       Objective:   Physical Exam  Constitutional: Vital signs are normal. She appears well-developed and well-nourished. She is cooperative.  Non-toxic appearance. She does not appear ill. No distress.  HENT:  Head: Normocephalic.  Right Ear: Hearing, external ear  and ear canal normal. Tympanic membrane is not erythematous, not retracted and not bulging. A middle ear effusion is present.  Left Ear: Hearing, external ear and ear canal normal. Tympanic membrane is not erythematous, not retracted and not bulging.  No middle ear effusion.  Nose: No mucosal edema or rhinorrhea. Right sinus exhibits no maxillary sinus tenderness and no frontal sinus tenderness. Left sinus exhibits no maxillary sinus tenderness and no frontal sinus tenderness.  Mouth/Throat: Uvula is midline, oropharynx is clear and moist and mucous membranes are normal.  ttp and popping over right TMJ  Eyes: Conjunctivae, EOM and lids are normal. Pupils are equal, round, and reactive to light. Lids are everted and swept, no foreign bodies found.  Neck: Trachea normal and normal range of motion. Neck supple. Carotid bruit is not present. No thyroid mass and no thyromegaly present.  Cardiovascular: Normal rate, regular rhythm, S1 normal, S2 normal, normal heart sounds, intact distal pulses and normal pulses. Exam reveals no gallop and no friction rub.  No murmur heard. Pulmonary/Chest: Effort normal and breath sounds normal. No tachypnea. No respiratory distress. She has no decreased breath sounds. She has no wheezes. She has no rhonchi. She has no rales. She exhibits no tenderness and no bony tenderness.  Abdominal: Soft. Normal appearance and bowel sounds are normal. There is no tenderness.  Neurological: She is alert.  Skin: Skin is warm, dry and intact. No rash noted.  Psychiatric: Her speech is normal and  behavior is normal. Judgment and thought content normal. Her mood appears not anxious. Cognition and memory are normal. She does not exhibit a depressed mood.          Assessment & Plan:

## 2017-05-04 NOTE — Assessment & Plan Note (Signed)
No changes on MMSE and lab eval. She request referral to neurologist.

## 2017-05-04 NOTE — Assessment & Plan Note (Signed)
Reviewed treatment and avoidance. Recommended dental eval for grinding.

## 2017-05-04 NOTE — Assessment & Plan Note (Signed)
May be due to fluids from ETD in right ear. Treat with course of flonase. Consider hearing test and ent referral if not improving.

## 2017-05-04 NOTE — Assessment & Plan Note (Signed)
Likely viral bronchitis.Marland Kitchen Resolved  Despite only 3 days of antibiotics.

## 2017-05-04 NOTE — Assessment & Plan Note (Signed)
Treat with nasal steroid spray x 1-2 weeks.

## 2017-05-29 ENCOUNTER — Ambulatory Visit: Payer: Self-pay | Admitting: Family Medicine

## 2017-05-31 DIAGNOSIS — L853 Xerosis cutis: Secondary | ICD-10-CM | POA: Diagnosis not present

## 2017-05-31 DIAGNOSIS — L218 Other seborrheic dermatitis: Secondary | ICD-10-CM | POA: Diagnosis not present

## 2017-05-31 DIAGNOSIS — L84 Corns and callosities: Secondary | ICD-10-CM | POA: Diagnosis not present

## 2017-06-13 ENCOUNTER — Encounter: Payer: Self-pay | Admitting: Neurology

## 2017-06-13 ENCOUNTER — Encounter: Payer: Self-pay | Admitting: Psychology

## 2017-06-13 ENCOUNTER — Ambulatory Visit (INDEPENDENT_AMBULATORY_CARE_PROVIDER_SITE_OTHER): Payer: PPO | Admitting: Neurology

## 2017-06-13 DIAGNOSIS — R413 Other amnesia: Secondary | ICD-10-CM | POA: Diagnosis not present

## 2017-06-13 NOTE — Patient Instructions (Signed)
MRI brain Formal memory testing with Dr. Bonita Quin Follow up after testing with Dr. Si Raider, make an appointment approx 1 month after appointment with her  Memory Compensation Strategies  1. Use "WARM" strategy.  W= write it down  A= associate it  R= repeat it  M= make a mental note  2.   You can keep a Social worker.  Use a 3-ring notebook with sections for the following: calendar, important names and phone numbers,  medications, doctors' names/phone numbers, lists/reminders, and a section to journal what you did  each day.   3.    Use a calendar to write appointments down.  4.    Write yourself a schedule for the day.  This can be placed on the calendar or in a separate section of the Memory Notebook.  Keeping a  regular schedule can help memory.  5.    Use medication organizer with sections for each day or morning/evening pills.  You may need help loading it  6.    Keep a basket, or pegboard by the door.  Place items that you need to take out with you in the basket or on the pegboard.  You may also want to  include a message board for reminders.  7.    Use sticky notes.  Place sticky notes with reminders in a place where the task is performed.  For example: " turn off the  stove" placed by the stove, "lock the door" placed on the door at eye level, " take your medications" on  the bathroom mirror or by the place where you normally take your medications.  8.    Use alarms/timers.  Use while cooking to remind yourself to check on food or as a reminder to take your medicine, or as a  reminder to make a call, or as a reminder to perform another task, etc.

## 2017-06-13 NOTE — Progress Notes (Addendum)
[facility]    Provider:  Dr Jaynee Eagles Referring Provider: Jinny Sanders, MD Primary Care Physician:  Jinny Sanders, MD  CC:  Memory loss  HPI:  Jessica Hardin is a 76 y.o. female here as a referral from Dr. Diona Browner for memory changes. PMHx migraine, IBS, asthma. She will be driving and she gets "turned around" even if places she normally goes. She loses things in the house. She has more difficulty with numbers such as counting backwards. Having more difficulty with math problems. Symptoms started 2 years ago and gradually worsening. Her husband has noticed and it frustrated him. Mohter died at 57, she had memory loss but no dementia, father was 48 and had no memory loss. Lives at home with husband. No car accidents. Husband has paid the bills for years and this is unassociated with recent symptoms. Having difficulty with date, she knows the day and year but has more difficulty and has to ask herself "what is today?". She may repeat things.  Denies significant anxiety or mood disorder. Also feels her balance is affected and she stumbles backwards. She tosses and turns and does not sleep well lately, she denies snoring, she is worried about her memory and she is annoyed at her husband bc he complains about her memory and this keeps her up at night worrying. She does also worry about her daughter who has multiple sclerosis, worrying about a lot of things and stress. She has dizziness when she very anxious. other focal neurologic deficits, associated symptoms, inciting events or modifiable factors.   Reviewed notes, labs and imaging from outside physicians, which showed:  B12, tsh, rpr normal  CT head 07/2009:   Atrophy with chronic small vessel ischemic disease. No  acute intracranial abnormality.  Review of Systems: Patient complains of symptoms per HPI as well as the following symptoms: memory loss, anxiety. Pertinent negatives and positives per HPI. All others  negative.   Social History   Socioeconomic History  . Marital status: Married    Spouse name: Not on file  . Number of children: Not on file  . Years of education: Not on file  . Highest education level: Not on file  Social Needs  . Financial resource strain: Not on file  . Food insecurity - worry: Not on file  . Food insecurity - inability: Not on file  . Transportation needs - medical: Not on file  . Transportation needs - non-medical: Not on file  Occupational History  . Not on file  Tobacco Use  . Smoking status: Never Smoker  . Smokeless tobacco: Never Used  Substance and Sexual Activity  . Alcohol use: No    Alcohol/week: 0.0 oz  . Drug use: No  . Sexual activity: Yes  Other Topics Concern  . Not on file  Social History Narrative  . Not on file    Family History  Problem Relation Age of Onset  . Arthritis Mother   . Hypertension Mother   . Arthritis Sister   . Hypertension Sister   . Colon cancer Brother 40  . Breast cancer Sister   . Breast cancer Sister   . Uterine cancer Sister 19    Past Medical History:  Diagnosis Date  . Allergy   . Asthma   . GERD (gastroesophageal reflux disease)   . History of chicken pox   . IBS (irritable bowel syndrome)   . Migraine   . Urinary tract infection     Past Surgical History:  Procedure Laterality Date  . ABDOMINAL HYSTERECTOMY    . CATARACT EXTRACTION, BILATERAL    . TUBAL LIGATION      Current Outpatient Medications  Medication Sig Dispense Refill  . albuterol (PROVENTIL HFA;VENTOLIN HFA) 108 (90 Base) MCG/ACT inhaler Inhale 2 puffs every 6 (six) hours as needed into the lungs for wheezing or shortness of breath. 1 Inhaler 2  . Apoaequorin (PREVAGEN PO) Take by mouth.    Marland Kitchen DANDELION PO Take by mouth daily.    . fluticasone (FLONASE) 50 MCG/ACT nasal spray Place 2 sprays into both nostrils daily as needed. Reported on 09/08/2015 16 g 0  . Multiple Vitamin (MULTIVITAMIN) capsule Take 1 capsule by mouth  daily.     . polyethylene glycol (MIRALAX / GLYCOLAX) packet Take 17 g by mouth daily as needed for mild constipation.    . triamcinolone cream (KENALOG) 0.1 % Apply 1 application topically 2 (two) times daily.    Marland Kitchen UNABLE TO FIND Med Name: Focus Factor     No current facility-administered medications for this visit.     Allergies as of 06/13/2017 - Review Complete 06/13/2017  Allergen Reaction Noted  . Shellfish allergy Hives and Other (See Comments) 08/13/2014    Vitals: BP 129/81   Pulse 63   Ht 4\' 11"  (1.499 m)   Wt 131 lb (59.4 kg)   BMI 26.46 kg/m  Last Weight:  Wt Readings from Last 1 Encounters:  06/13/17 131 lb (59.4 kg)   Last Height:   Ht Readings from Last 1 Encounters:  06/13/17 4\' 11"  (1.499 m)   Physical exam: Exam: Gen: NAD, conversant, well nourised, thin, well groomed                     CV: RRR, no MRG. No Carotid Bruits. No peripheral edema, warm, nontender Eyes: Conjunctivae clear without exudates or hemorrhage  Neuro: Detailed Neurologic Exam  Speech:    Speech is normal; fluent and spontaneous with normal comprehension.  Cognition:  MMSE - Mini Mental State Exam 06/13/2017 11/04/2015  Orientation to time 5 5  Orientation to Place 5 5  Registration 3 3  Attention/ Calculation 5 0  Recall 2 3  Language- name 2 objects 2 0  Language- repeat 1 1  Language- follow 3 step command 3 3  Language- read & follow direction 1 0  Write a sentence 1 0  Copy design 1 0  Total score 29 20      The patient is oriented to person, place, and time;     recent and remote memory a[ppears intact;     language fluent;     normal attention, concentration, fund of knowledge Cranial Nerves:    The pupils are equal, round, and reactive to light. Attempted fundoscopic exam could not visualize. Visual fields are full to finger confrontation. Extraocular movements are intact. Trigeminal sensation is intact and the muscles of mastication are normal. The face is  symmetric. The palate elevates in the midline. Hearing intact. Voice is normal. Shoulder shrug is normal. The tongue has normal motion without fasciculations.   Coordination:    Normal finger to nose and heel to shin. Normal rapid alternating movements.   Gait:    Not ataxic  Motor Observation:    No asymmetry, no atrophy, and no involuntary movements noted. Tone:    Normal muscle tone.    Posture:    Posture is normal. normal erect    Strength:    Strength is  V/V in the upper and lower limbs.      Sensation: intact to LT     Reflex Exam:  DTR's:    Deep tendon reflexes in the upper and lower extremities are symmetrical bilaterally.   Toes:    The toes are equiv bilaterally.   Clonus:    Clonus is absent.       Assessment/Plan:  76 year old who has been c/o memory changes for several years since 2016. She reports progression. Mother had memory problems around the same age as patient is now. MMSE was 29/30 (in 2017 20/30). Questionable if anxiety is a significant factor considering the wide variation in MMSEs (29 today, 20 in 2017). Patient insists she has progressive memory changes and she believes it may be dementia like her mother. She says her husband also noticing. Since patient reporting memory complaints to pcp since 2016 and being sent her for eval, feel an MRI of the brain as well as formal neurocognitive testing warranted.  MRI brain for reversible causes of cognitive impairment or etiologies of her dizziness Formal neurocognitive testing, will refer but this is very likely due to stress and anxiety Dizziness: Again likely due to stress and anxiety Physical therapy for eval and treatment of near-falls, gait and safety  Orders Placed This Encounter  Procedures  . MR BRAIN WO CONTRAST  . Ambulatory referral to Neuropsychology     Sarina Ill, MD  [facility] [address] [place],  [address]  Phone [phone] Fax  [phone]

## 2017-06-20 ENCOUNTER — Ambulatory Visit: Payer: Self-pay | Admitting: Neurology

## 2017-07-07 ENCOUNTER — Ambulatory Visit
Admission: RE | Admit: 2017-07-07 | Discharge: 2017-07-07 | Disposition: A | Discharge status: Home / Self Care | Payer: PPO | Source: Ambulatory Visit | Attending: Neurology | Admitting: Neurology

## 2017-07-07 DIAGNOSIS — R413 Other amnesia: Secondary | ICD-10-CM

## 2017-07-09 ENCOUNTER — Telehealth: Payer: Self-pay | Admitting: *Deleted

## 2017-07-09 NOTE — Telephone Encounter (Signed)
Spoke with patient. She is aware MRI brain normal for her age. Her questions were answered. She plans to see Dr. Halina Andreas in April.

## 2017-07-09 NOTE — Telephone Encounter (Signed)
-----   Message from Melvenia Beam, MD sent at 07/09/2017  2:17 PM EST ----- MRI brain normal for age thanks

## 2017-09-03 DIAGNOSIS — H40003 Preglaucoma, unspecified, bilateral: Secondary | ICD-10-CM | POA: Diagnosis not present

## 2017-09-10 DIAGNOSIS — H40003 Preglaucoma, unspecified, bilateral: Secondary | ICD-10-CM | POA: Diagnosis not present

## 2017-10-01 DIAGNOSIS — J309 Allergic rhinitis, unspecified: Secondary | ICD-10-CM | POA: Diagnosis not present

## 2017-10-03 DIAGNOSIS — J301 Allergic rhinitis due to pollen: Secondary | ICD-10-CM | POA: Diagnosis not present

## 2017-10-08 ENCOUNTER — Ambulatory Visit: Payer: PPO | Admitting: Psychology

## 2017-10-08 ENCOUNTER — Encounter: Payer: Self-pay | Admitting: Psychology

## 2017-10-08 DIAGNOSIS — R413 Other amnesia: Secondary | ICD-10-CM

## 2017-10-08 NOTE — Progress Notes (Addendum)
NEUROBEHAVIORAL STATUS EXAM   Name: Jessica Hardin Date of Birth: Aug 30, 1940 Date of Interview: 10/08/2017  Reason for Referral:  Jessica Hardin is a 77 y.o. female who is referred for neuropsychological evaluation by Dr. Sarina Ill of [facility] due to concerns about memory loss. This patient is accompanied in the office by her husband.  History of Presenting Problem:  Jessica Hardin was seen for neurologic evaluation by Dr. Jaynee Eagles on 06/13/2017 for memory concerns. She reported gradual onset and progressive worsening over the past 2 years. MMSE was 29/30, it was noted that in 2017 it had been 20/30. The patient endorsed significant anxiety and sleep difficulty, and it was suspected these issues could be contributing to perceived memory decline. MRI brain completed on 07/07/2017 reportedly revealed the following: 1.    Brain volume is normal for age. 2.    Mild age-appropriate chronic microvascular ischemic change. 3.    There are no acute findings.   At today's visit (10/08/2017), the patient reports the following cognitive symptoms: forgetting details of conversations and things she has been told recently, more difficulty recalling recent events, forgetting where she has placed items (but not necessarily misplacing them), difficulty concentrating (especially when reading), processing information more slowly, word finding difficulty and more trouble recalling routes to places she has not been many times. She denied being told that she repeats questions/statements. She denied forgetting/missing appointments or other obligations. She denied difficulty comprehending others when they speak to her. She denied ever getting lost or making wrong turns when driving. She is just more uncertain of directions/routes.   Of note, she stated that in speaking with her nieces, she has asked on two occasions how their father is doing, and had to be reminded that their father is deceased.    The patient lives at home with her husband. She continues to manage IADLs including driving, medications, meals, appointments and housekeeping. She is not on any daily medications but states that if she were, she is afraid she would forget to take them. Her husband has managed the finances for a long time. She states if she had to manage them she would probably forget to pay some bills. She has not had any difficulty with managing appointments. She has not had any difficulty with cooking and meal preparation.   Physically, she denies any significant complaints. She does not have chronic pain. She does feel off balance at times when she is walking. She has not had any falls.  She endorsed depressed mood and significant anxiety. She has never sought treatment for these issues. She states she is having more trouble with mood lately because her husband is more impatient with her, and they are not communicating with one another as well. She denies any past or present suicidal ideation. She worries a lot. She has sleep difficulty, and has problems with both sleep onset and maintenance. She sometimes only gets 2 hours of sleep a night.   Family history is significant for memory loss in her mother around the same age she is now (never formally diagnosed as dementia). There is no memory loss or dementia in any of the patient's siblings. Her father lived to be almost 26 with no memory loss.   ADDENDUM: The patient's husband completed the AD-8 and endorsed the following changes in his wife: repeating questions/statements, trouble remembering appointments, and daily problems with thinking and/or memory).    Social History: Born/Raised: [place], [place] Education: She completed high school and  some college in [place], then finished her bachelor's degree in the [place]. 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 Marital history: Married with 3 children and adopted  one grandchild. 6 grandchildren. Alcohol: None Tobacco: None, never a smoker   Medical History: Past Medical History:  Diagnosis Date  . Allergy   . Asthma   . GERD (gastroesophageal reflux disease)   . History of chicken pox   . IBS (irritable bowel syndrome)   . Migraine   . Urinary tract infection      Current Medications:  Outpatient Encounter Medications as of 10/08/2017  Medication Sig  . albuterol (PROVENTIL HFA;VENTOLIN HFA) 108 (90 Base) MCG/ACT inhaler Inhale 2 puffs every 6 (six) hours as needed into the lungs for wheezing or shortness of breath.  . Apoaequorin (PREVAGEN PO) Take by mouth.  Marland Kitchen DANDELION PO Take by mouth daily.  . fluticasone (FLONASE) 50 MCG/ACT nasal spray Place 2 sprays into both nostrils daily as needed. Reported on 09/08/2015  . Multiple Vitamin (MULTIVITAMIN) capsule Take 1 capsule by mouth daily.   . polyethylene glycol (MIRALAX / GLYCOLAX) packet Take 17 g by mouth daily as needed for mild constipation.  . triamcinolone cream (KENALOG) 0.1 % Apply 1 application topically 2 (two) times daily.  Marland Kitchen UNABLE TO FIND Med Name: Focus Factor   No facility-administered encounter medications on file as of 10/08/2017.      Behavioral Observations:   Appearance: Neatly and appropriately dressed and groomed Gait: Ambulated independently, no gross abnormalities observed Speech: Fluent; normal rate, rhythm and volume. No significant word finding difficulty. Thought process: Linear, goal directed Affect: Blunted Interpersonal: Pleasant, appropriate   30 minutes spent face-to-face with patient completing neurobehavioral status exam. 30 minutes spent integrating medical records/clinical data and completing this report. CPT code [id number].   TESTING: There is medical necessity to proceed with neuropsychological assessment as the results will be used to aid in differential diagnosis and clinical decision-making and to inform specific treatment recommendations.  Per the patient and medical records reviewed, there has been a change in cognitive functioning and a reasonable suspicion of neurocognitive disorder (MCI versus pseudodementia due to depression/anxiety).  Clinical Decision Making: In considering the patient's current level of functioning, level of presumed impairment, nature of symptoms, emotional and behavioral responses during the interview, level of literacy, and observed level of motivation, a battery of tests was selected and communicated to the psychometrician.   Following the clinical interview/neurobehavioral status exam, the patient completed this full battery of neuropsychological testing with my psychometrician under my supervision (see separate note).   PLAN: The patient will return to see me for a follow-up session at which time her test performances and my impressions and treatment recommendations will be reviewed in detail.  Evaluation ongoing; full report to follow.

## 2017-10-08 NOTE — Progress Notes (Addendum)
   Neuropsychology Note  Jessica Hardin completed 60 minutes of neuropsychological testing with technician, Milana Kidney, BS, under the supervision of Dr. Macarthur Critchley, Licensed Psychologist. The patient did not appear overtly distressed by the testing session, per behavioral observation or via self-report to the technician. Rest breaks were offered.   Clinical Decision Making: In considering the patient's current level of functioning, level of presumed impairment, nature of symptoms, emotional and behavioral responses during the interview, level of literacy, and observed level of motivation/effort, a battery of tests was selected and communicated to the psychometrician.  Communication between the psychologist and technician was ongoing throughout the testing session and changes were made as deemed necessary based on patient performance on testing, technician observations and additional pertinent factors such as those listed above.  Jessica Hardin will return within approximately 2 weeks for an interactive feedback session with Dr. Si Raider at which time her test performances, clinical impressions and treatment recommendations will be reviewed in detail. The patient understands she can contact our office should she require our assistance before this time.  15 minutes spent performing neuropsychological evaluation services/clinical decision making (psychologist). [CPT 08022] 60 minutes spent face-to-face with patient administering standardized tests, 30 minutes spent scoring (technician). [CPT [id number], 33612]  Full report to follow.

## 2017-10-17 DIAGNOSIS — J309 Allergic rhinitis, unspecified: Secondary | ICD-10-CM | POA: Diagnosis not present

## 2017-10-26 DIAGNOSIS — R42 Dizziness and giddiness: Secondary | ICD-10-CM | POA: Diagnosis not present

## 2017-10-27 ENCOUNTER — Other Ambulatory Visit: Payer: Self-pay

## 2017-10-27 ENCOUNTER — Emergency Department
Admission: EM | Admit: 2017-10-27 | Discharge: 2017-10-27 | Disposition: A | Discharge status: Home / Self Care | Payer: PPO | Attending: Student in an Organized Health Care Education/Training Program | Admitting: Student in an Organized Health Care Education/Training Program

## 2017-10-27 ENCOUNTER — Encounter: Payer: Self-pay | Admitting: Emergency Medicine

## 2017-10-27 DIAGNOSIS — Z79899 Other long term (current) drug therapy: Secondary | ICD-10-CM | POA: Diagnosis not present

## 2017-10-27 DIAGNOSIS — F419 Anxiety disorder, unspecified: Secondary | ICD-10-CM

## 2017-10-27 DIAGNOSIS — R42 Dizziness and giddiness: Secondary | ICD-10-CM | POA: Insufficient documentation

## 2017-10-27 DIAGNOSIS — J45909 Unspecified asthma, uncomplicated: Secondary | ICD-10-CM | POA: Diagnosis not present

## 2017-10-27 DIAGNOSIS — R531 Weakness: Secondary | ICD-10-CM | POA: Diagnosis not present

## 2017-10-27 LAB — CBC
HEMATOCRIT: 38.1 % (ref 35.0–47.0)
Hemoglobin: 12.7 g/dL (ref 12.0–16.0)
MCH: 30.4 pg (ref 26.0–34.0)
MCHC: 33.4 g/dL (ref 32.0–36.0)
MCV: 91.1 fL (ref 80.0–100.0)
PLATELETS: 197 10*3/uL (ref 150–440)
RBC: 4.18 MIL/uL (ref 3.80–5.20)
RDW: 13.4 % (ref 11.5–14.5)
WBC: 5 10*3/uL (ref 3.6–11.0)

## 2017-10-27 LAB — BASIC METABOLIC PANEL
Anion gap: 5 (ref 5–15)
BUN: 18 mg/dL (ref 6–20)
CHLORIDE: 103 mmol/L (ref 101–111)
CO2: 29 mmol/L (ref 22–32)
CREATININE: 0.94 mg/dL (ref 0.44–1.00)
Calcium: 9.3 mg/dL (ref 8.9–10.3)
GFR calc Af Amer: >60 mL/min (ref >60)
GFR calc non Af Amer: 57 mL/min — ABNORMAL LOW (ref >60)
GLUCOSE: 107 mg/dL — AB (ref 65–99)
Potassium: 3.4 mmol/L — ABNORMAL LOW (ref 3.5–5.1)
SODIUM: 137 mmol/L (ref 135–145)

## 2017-10-27 LAB — TROPONIN I: Troponin I: <0.03 ng/mL (ref <0.03)

## 2017-10-27 MED ORDER — LORAZEPAM 0.5 MG PO TABS
0.5000 mg | ORAL_TABLET | Freq: Once | ORAL | Status: AC
  Administered 2017-10-27: 0.5 mg via ORAL
  Filled 2017-10-27: qty 1

## 2017-10-27 NOTE — ED Triage Notes (Signed)
C/O feeling dizzy "for a while" (greater than one week) and today generalized weakness and anxiety.  Patietn AAOx3.  Skin warm and dry.NAD

## 2017-10-27 NOTE — Discharge Instructions (Addendum)
Please follow-up with PCP.  Return for any worsening symptoms, questions or concerns.

## 2017-10-27 NOTE — ED Provider Notes (Signed)
[facility] Emergency Department Provider Note    First MD Initiated Contact with Patient 10/27/17 1825     (approximate)  I have reviewed the triage vital signs and the nursing notes.   HISTORY  Chief Complaint Dizziness and Anxiety    HPI Jessica Hardin is a 77 y.o. female with report of anxiety for the past several days.  Patient states that she feels a fluttering in her stomach.  Symptoms worsened last night after she got a phone call from unknown persons asking her about her daughter.  She spoke with her daughter and her daughter states that this was a scam and the patient thought about that all night and did not sleep well.  She denies any chest pain.  There is documentation that she was complaining of dizziness in triage but the patient denies any dizziness lightheadedness numbness or tingling.  No headaches.  No shortness of breath.  Has had episodes like this in the past.  SI or HI.  Past Medical History:  Diagnosis Date  . Allergy   . Asthma   . GERD (gastroesophageal reflux disease)   . History of chicken pox   . IBS (irritable bowel syndrome)   . Migraine   . Urinary tract infection    Family History  Problem Relation Age of Onset  . Arthritis Mother   . Hypertension Mother   . Arthritis Sister   . Hypertension Sister   . Colon cancer Brother 71  . Breast cancer Sister   . Breast cancer Sister   . Uterine cancer Sister 31   Past Surgical History:  Procedure Laterality Date  . ABDOMINAL HYSTERECTOMY    . CATARACT EXTRACTION, BILATERAL    . TUBAL LIGATION     Patient Active Problem List   Diagnosis Date Noted  . TMJ tenderness, right 05/04/2017  . Cough 05/04/2017  . Post-nasal drip 05/04/2017  . Ear ringing sound, right 05/04/2017  . Dysthymia 01/30/2017  . 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 Epidermoid cyst of finger of left hand 07/27/2016  . Osteopenia 11/18/2015  . Chronic constipation 07/20/2015  . Internal  hemorrhoid, bleeding 07/20/2015  . 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 Counseling regarding end of life decision making 09/08/2014  . Childhood asthma 08/13/2014  . Gastroesophageal reflux disease without esophagitis 08/13/2014  . Irritable bowel syndrome with constipation 08/13/2014  . Hx of migraines 08/13/2014  . Family history of colon cancer 08/13/2014  . Family history of uterine cancer 08/13/2014  . Family history of breast cancer 08/13/2014      Prior to Admission medications   Medication Sig Start Date End Date Taking? Authorizing Provider  albuterol (PROVENTIL HFA;VENTOLIN HFA) 108 (90 Base) MCG/ACT inhaler Inhale 2 puffs every 6 (six) hours as needed into the lungs for wheezing or shortness of breath. 04/27/17   Merlyn Lot, MD  Apoaequorin (PREVAGEN PO) Take by mouth.    [provider]  DANDELION PO Take by mouth daily.    [provider]  fluticasone (FLONASE) 50 MCG/ACT nasal spray Place 2 sprays into both nostrils daily as needed. Reported on 09/08/2015 09/08/15   Tonia Ghent, MD  Multiple Vitamin (MULTIVITAMIN) capsule Take 1 capsule by mouth daily.     [provider]  polyethylene glycol (MIRALAX / GLYCOLAX) packet Take 17 g by mouth daily as needed for mild constipation.    [provider]  triamcinolone cream (KENALOG) 0.1 % Apply 1 application topically 2 (two) times daily.  [provider]  UNABLE TO FIND Med Name: Focus Factor    [provider]    Allergies Shellfish allergy    Social History Social History   Tobacco Use  . Smoking status: Never Smoker  . Smokeless tobacco: Never Used  Substance Use Topics  . Alcohol use: No    Alcohol/week: 0.0 oz  . Drug use: No    Review of Systems Patient denies headaches, rhinorrhea, blurry vision, numbness, shortness of breath, chest pain, edema, cough, abdominal pain, nausea, vomiting, diarrhea, dysuria, fevers, rashes or hallucinations unless  otherwise stated above in HPI. ____________________________________________   PHYSICAL EXAM:  VITAL SIGNS: Vitals:   10/27/17 1654 10/27/17 1830  BP: (!) 146/75 (!) 167/78  Pulse: 67 (!) 57  Resp: 16   Temp: 98.1 F (36.7 C)   SpO2: 98% 99%    Constitutional: Alert and oriented. Well appearing and in no acute distress. Eyes: Conjunctivae are normal.  Head: Atraumatic. Nose: No congestion/rhinnorhea. Mouth/Throat: Mucous membranes are moist.   Neck: No stridor. Painless ROM.  Cardiovascular: Normal rate, regular rhythm. Grossly normal heart sounds.  Good peripheral circulation. Respiratory: Normal respiratory effort.  No retractions. Lungs CTAB. Gastrointestinal: Soft and nontender. No distention. No abdominal bruits. No CVA tenderness. Genitourinary:  Musculoskeletal: No lower extremity tenderness nor edema.  No joint effusions. Neurologic:  CN- intact.  No facial droop, Normal FNF.  Normal heel to shin.  Sensation intact bilaterally. Normal speech and language. No gross focal neurologic deficits are appreciated. No gait instability.  Skin:  Skin is warm, dry and intact. No rash noted. Psychiatric:  Speech and behavior are normal.  ____________________________________________   LABS (all labs ordered are listed, but only abnormal results are displayed)  Results for orders placed or performed during the hospital encounter of 10/27/17 (from the past 24 hour(s))  Basic metabolic panel     Status: Abnormal   Collection Time: 10/27/17  5:00 PM  Result Value Ref Range   Sodium 137 135 - 145 mmol/L   Potassium 3.4 (L) 3.5 - 5.1 mmol/L   Chloride 103 101 - 111 mmol/L   CO2 29 22 - 32 mmol/L   Glucose, Bld 107 (H) 65 - 99 mg/dL   BUN 18 6 - 20 mg/dL   Creatinine, Ser 0.94 0.44 - 1.00 mg/dL   Calcium 9.3 8.9 - 10.3 mg/dL   GFR calc non Af Amer 57 (L) >60 mL/min   GFR calc Af Amer >60 >60 mL/min   Anion gap 5 5 - 15  CBC     Status: None   Collection Time: 10/27/17  5:00 PM   Result Value Ref Range   WBC 5.0 3.6 - 11.0 K/uL   RBC 4.18 3.80 - 5.20 MIL/uL   Hemoglobin 12.7 12.0 - 16.0 g/dL   HCT 38.1 35.0 - 47.0 %   MCV 91.1 80.0 - 100.0 fL   MCH 30.4 26.0 - 34.0 pg   MCHC 33.4 32.0 - 36.0 g/dL   RDW 13.4 11.5 - 14.5 %   Platelets 197 150 - 440 K/uL  Troponin I     Status: None   Collection Time: 10/27/17  5:00 PM  Result Value Ref Range   Troponin I <0.03 <0.03 ng/mL   ____________________________________________  EKG My review and personal interpretation at Time: 17:06   Indication: anxiety  Rate: 65  Rhythm: sinus Axis: normal Other: normal intervals, no stemi ____________________________________________  RADIOLOGY  I personally reviewed all radiographic images ordered to evaluate for  the above acute complaints and reviewed radiology reports and findings.  These findings were personally discussed with the patient.  Please see medical record for radiology report.  ____________________________________________   PROCEDURES  Procedure(s) performed:  Procedures    Critical Care performed: no ____________________________________________   INITIAL IMPRESSION / ASSESSMENT AND PLAN / ED COURSE  Pertinent labs & imaging results that were available during my care of the patient were reviewed by me and considered in my medical decision making (see chart for details).  DDX: Anxiety, SI, HI, dysrhythmia, electrolyte abnormality  Jessica Hardin is a 77 y.o. who presents to the ED with symptoms as described above.  Blood work is reassuring.  No evidence of dysrhythmia or abnormality on EKG.  Patient denies any symptoms that would suggest cardiac etiology of her presentation.    Clinical Course as of Oct 28 2015  Sat Oct 27, 2017  1956 Patient with significant improvement in symptoms after Ativan.  Requesting discharge home.  Able to ambulate with steady gait.  At this point I do believe she stable and appropriate for discharge.   [PR]      Clinical Course User Index [PR] Merlyn Lot, MD     As part of my medical decision making, I reviewed the following data within the [place] notes reviewed and incorporated, Labs reviewed, notes from prior ED visits and [place] Controlled Substance Database   ____________________________________________   FINAL CLINICAL IMPRESSION(S) / ED DIAGNOSES  Final diagnoses:  Anxiety      NEW MEDICATIONS STARTED DURING THIS VISIT:  New Prescriptions   No medications on file     Note:  This document was prepared using Dragon voice recognition software and may include unintentional dictation errors.    Merlyn Lot, MD 10/27/17 2022

## 2017-10-30 ENCOUNTER — Other Ambulatory Visit: Payer: Self-pay

## 2017-10-30 ENCOUNTER — Encounter: Payer: Self-pay | Admitting: *Deleted

## 2017-10-30 ENCOUNTER — Encounter: Payer: Self-pay | Admitting: Family Medicine

## 2017-10-30 ENCOUNTER — Ambulatory Visit (INDEPENDENT_AMBULATORY_CARE_PROVIDER_SITE_OTHER): Payer: PPO | Admitting: Family Medicine

## 2017-10-30 VITALS — BP 110/70 | HR 65 | Temp 98.4°F | Ht 58.5 in | Wt 131.5 lb

## 2017-10-30 DIAGNOSIS — F5104 Psychophysiologic insomnia: Secondary | ICD-10-CM

## 2017-10-30 DIAGNOSIS — F320 Major depressive disorder, single episode, mild: Secondary | ICD-10-CM | POA: Diagnosis not present

## 2017-10-30 DIAGNOSIS — R413 Other amnesia: Secondary | ICD-10-CM | POA: Diagnosis not present

## 2017-10-30 DIAGNOSIS — F411 Generalized anxiety disorder: Secondary | ICD-10-CM

## 2017-10-30 MED ORDER — SERTRALINE HCL 25 MG PO TABS: 25.0000 mg | ORAL_TABLET | Freq: Every day | ORAL | 11 refills | Status: DC

## 2017-10-30 NOTE — Assessment & Plan Note (Signed)
Hopefull will improve with improvement of anxiety.

## 2017-10-30 NOTE — Assessment & Plan Note (Signed)
Start SSRI. Recommend counseling as well.. Will discuss with Dr. Si Raider, neuro cog psycologist.

## 2017-10-30 NOTE — Assessment & Plan Note (Signed)
Awaiting  Neurocognitive report.  Likely mood contributing to memory issues given neg work up.

## 2017-10-30 NOTE — Patient Instructions (Addendum)
Start sertraline 25 mg daily at bedtime.  Call if any severe side effects.  Keep appt with Dr. Si Raider.

## 2017-10-30 NOTE — Assessment & Plan Note (Signed)
Start low dose SSRI and follow up. Reviewed SE profile.

## 2017-10-30 NOTE — Progress Notes (Signed)
Subjective:    Patient ID: Jessica Hardin, female    DOB: 11/24/1940, 77 y.o.   MRN: [id number]  HPI   77 year old female presents for ER follow up.  On 5/11 she was seen  At ER for anxiety. Patient states that she feels a fluttering in her stomach.  Symptoms worsened last night after she got a phone call from unknown persons asking her about her daughter.  She spoke with her daughter and her daughter states that this was a scam and the patient thought about that all night and did not sleep well.  She denies any chest pain.  There is documentation that she was complaining of dizziness in triage but the patient denies any dizziness lightheadedness numbness or tingling.  No headaches.  No shortness of breath.  Has had episodes like this in the past.  Jessica or HI.    In ER lab eval nml, neg troponin I and stable EKG.  Symptoms improved with lorazepam 0.5 mg.  Today  10/30/17 She reports she is very anxious about her memory. She has noted increased worry, every day feeling anxious. She reports mood is worsening in last 5-6 months.  She reports some depression regarding her memory as well.   PHQ9 14 GAD 7: 15  She states she is having more trouble with mood lately because her husband is more impatient with her, and they are not communicating with one another as well. She denies any past or present suicidal ideation. She worries a lot. She has sleep difficulty, and has problems with both sleep onset and maintenance. She sometimes only gets 2 hours of sleep a night.   Family history is significant for memory loss in her mother around the same age she is now (never formally diagnosed as dementia).   2016 MMSE 30/30  2018 MMSE 30/30 Referred to neuro 04/2017 given  Pt request.  Saw Dr. 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 No acute findings.   neurocognitive testing 10/08/2017  Has follow up with Dr. Si Hardin 11/26/2017   Social History  /Family History/Past Medical History reviewed in detail and updated in EMR if needed. Blood pressure 110/70, pulse 65, temperature 98.4 F (36.9 C), temperature source Oral, height 4' 10.5" (1.486 m), weight 131 lb 8 oz (59.6 kg).  Review of Systems  Constitutional: Negative for fatigue and fever.  HENT: Negative for congestion.   Eyes: Negative for pain.  Respiratory: Negative for cough and shortness of breath.   Cardiovascular: Negative for chest pain, palpitations and leg swelling.  Gastrointestinal: Negative for abdominal pain.  Genitourinary: Negative for dysuria and vaginal bleeding.  Musculoskeletal: Negative for back pain.  Neurological: Negative for syncope, light-headedness and headaches.  Psychiatric/Behavioral: Positive for agitation. Negative for dysphoric mood.       Objective:   Physical Exam  Constitutional: Vital signs are normal. She appears well-developed and well-nourished. She is cooperative.  Non-toxic appearance. She does not appear ill. No distress.  HENT:  Head: Normocephalic.  Right Ear: Hearing, tympanic membrane, external ear and ear canal normal. Tympanic membrane is not erythematous, not retracted and not bulging.  Left Ear: Hearing, tympanic membrane, external ear and ear canal normal. Tympanic membrane is not erythematous, not retracted and not bulging.  Nose: No mucosal edema or rhinorrhea. Right sinus exhibits no maxillary sinus tenderness and no frontal sinus tenderness. Left sinus exhibits no maxillary sinus tenderness and no frontal sinus tenderness.  Mouth/Throat: Uvula is midline, oropharynx  is clear and moist and mucous membranes are normal.  Eyes: Pupils are equal, round, and reactive to light. Conjunctivae, EOM and lids are normal. Lids are everted and swept, no foreign bodies found.  Neck: Trachea normal and normal range of motion. Neck supple. Carotid bruit is not present. No thyroid mass and no thyromegaly present.  Cardiovascular: Normal  rate, regular rhythm, S1 normal, S2 normal, normal heart sounds, intact distal pulses and normal pulses. Exam reveals no gallop and no friction rub.  No murmur heard. Pulmonary/Chest: Effort normal and breath sounds normal. No tachypnea. No respiratory distress. She has no decreased breath sounds. She has no wheezes. She has no rhonchi. She has no rales.  Abdominal: Soft. Normal appearance and bowel sounds are normal. There is no tenderness.  Neurological: She is alert.  Skin: Skin is warm, dry and intact. No rash noted.  Psychiatric: Her speech is normal and behavior is normal. Judgment and thought content normal. Her mood appears anxious. Cognition and memory are normal. She exhibits a depressed mood. She expresses no suicidal plans and no homicidal plans.          Assessment & Plan:

## 2017-10-31 DIAGNOSIS — R42 Dizziness and giddiness: Secondary | ICD-10-CM | POA: Diagnosis not present

## 2017-11-06 ENCOUNTER — Encounter: Payer: Self-pay | Admitting: Psychology

## 2017-11-18 NOTE — Progress Notes (Signed)
NEUROPSYCHOLOGICAL EVALUATION   Name:    Jessica Hardin  Date of Birth:   Apr 18, 1941 Date of Interview:  10/08/2017 Date of Testing:  10/08/2017   Date of Feedback:  11/20/2017       Background Information:  Reason for Referral:  DURGA SALDARRIAGA is a 77 y.o. female referred by Dr. Sarina Ill to assess her current level of cognitive functioning and assist in differential diagnosis. The current evaluation consisted of a review of available medical records, an interview with the patient and her husband, and the completion of a neuropsychological testing battery. Informed consent was obtained.  History of Presenting Problem:  Ms. Sachdeva was seen for neurologic evaluation by Dr. Jaynee Eagles on 06/13/2017 for memory concerns. She reported gradual onset and progressive worsening over the past 2 years. MMSE was 29/30, it was noted that in 2017 it had been 20/30. The patient endorsed significant anxiety and sleep difficulty, and it was suspected these issues could be contributing to perceived memory decline. MRI brain completed on 07/07/2017 reportedly revealed the following: 1. Brain volume is normal for age. 2. Mild age-appropriate chronic microvascular ischemic change. 3. There are no acute findings.   At today's visit (10/08/2017), the patient reports the following cognitive symptoms: forgetting details of conversations and things she has been told recently, more difficulty recalling recent events, forgetting where she has placed items (but not necessarily misplacing them), difficulty concentrating (especially when reading), processing information more slowly, word finding difficulty and more trouble recalling routes to places she has not been many times. She denied being told that she repeats questions/statements. She denied forgetting/missing appointments or other obligations. She denied difficulty comprehending others when they speak to her. She denied ever getting lost or making wrong  turns when driving. She is just more uncertain of directions/routes.   Of note, the patient stated that in speaking with her nieces, she has asked on two occasions how their father is doing, and had to be reminded that their father is deceased.   On an informant-report questionnaire assessing for changes in cognitive functioning, her husband indicated that she repeats questions/statements, has trouble remembering appointments, and demonstrates daily problems with memory and/or thinking.   The patient lives at home with her husband. She continues to manage IADLs including driving, medications, meals, appointments and housekeeping. She is not on any daily medications but states that if she were, she is afraid she would forget to take them. Her husband has managed the finances for a long time. She states if she had to manage them she would probably forget to pay some bills. She denies any difficulty with managing appointments. She has not had any difficulty with cooking and meal preparation.   Physically, she denies any significant complaints. She does not have chronic pain. She does feel off balance at times when she is walking. She has not had any falls.  She endorsed depressed mood and significant anxiety. She has never sought treatment for these issues. She states she is having more trouble with mood lately because her husband is more impatient with her, and they are not communicating with one another as well. She denies any past or present suicidal ideation. She worries a lot. She has sleep difficulty, and has problems with both sleep onset and maintenance. She sometimes only gets 2 hours of sleep a night.   Family history is significant for memory loss in her mother around the same age she is now (never formally diagnosed as dementia). There  is no memory loss or dementia in any of the patient's siblings. Her father lived to be almost 66 with no memory loss.    Social History: Born/Raised:  [place], [place] Education: She completed high school and some college in [place], then finished her bachelor's degree in the [place]. 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 Marital history: Married with 3 children and adopted one grandchild. 6 grandchildren. Alcohol: None Tobacco: None, never a smoker   Medical History:  Past Medical History:  Diagnosis Date  . Allergy   . Asthma   . GERD (gastroesophageal reflux disease)   . History of chicken pox   . IBS (irritable bowel syndrome)   . Migraine   . Urinary tract infection     Current medications:  Outpatient Encounter Medications as of 11/20/2017  Medication Sig  . albuterol (PROVENTIL HFA;VENTOLIN HFA) 108 (90 Base) MCG/ACT inhaler Inhale 2 puffs every 6 (six) hours as needed into the lungs for wheezing or shortness of breath.  . Apoaequorin (PREVAGEN PO) Take by mouth.  Marland Kitchen DANDELION PO Take by mouth daily.  . fluticasone (FLONASE) 50 MCG/ACT nasal spray Place 2 sprays into both nostrils daily as needed. Reported on 09/08/2015  . Multiple Vitamin (MULTIVITAMIN) capsule Take 1 capsule by mouth daily.   . polyethylene glycol (MIRALAX / GLYCOLAX) packet Take 17 g by mouth daily as needed for mild constipation.  . sertraline (ZOLOFT) 25 MG tablet Take 1 tablet (25 mg total) by mouth daily.  Marland Kitchen triamcinolone cream (KENALOG) 0.1 % Apply 1 application topically 2 (two) times daily.   No facility-administered encounter medications on file as of 11/20/2017.      Current Examination:  Behavioral Observations:  Appearance: Neatly and appropriately dressed and groomed Gait: Ambulated independently, no gross abnormalities observed Speech: Fluent; normal rate, rhythm and volume. No significant word finding difficulty. Thought process: Linear, goal directed Affect: Blunted Interpersonal: Pleasant, appropriate Orientation: Oriented to all spheres. Accurately named the current President and  his predecessor.   Tests Administered: . Test of Premorbid Functioning (TOPF) . Wechsler Adult Intelligence Scale-Fourth Edition (WAIS-IV): Similarities, Music therapist, Coding and Digit Span subtests . Wechsler Memory Scale-Fourth Edition (WMS-IV) Older Adult Version (ages 76-90): Logical Memory I, II and Recognition subtests  . [employer] Verbal Learning Test - 2nd Edition (CVLT-2) Short Form . Repeatable Battery for the Assessment of Neuropsychological Status (RBANS) Form A:  Figure Copy and Recall subtests and Semantic Fluency subtest . Boston Naming Test (BNT) . Boston Diagnostic Aphasia Examination: Complex Ideational Material subtest . Controlled Oral Word Association Test (COWAT) . Trail Making Test A and B . Clock drawing test . Beck Depression Inventory -2nd Edition (BDI-II) . Generalized Anxiety Disorder - 7 item screener (GAD-7) . AD8 Dementia Screening  Test Results: Note: Standardized scores are presented only for use by appropriately trained professionals and to allow for any future test-retest comparison. These scores should not be interpreted without consideration of all the information that is contained in the rest of the report. The most recent standardization samples from the test publisher or other sources were used whenever possible to derive standard scores; scores were corrected for age, gender, ethnicity and education when available.   Test Scores:  Test Name Raw Score Standardized Score Descriptor  TOPF 46/70 SS= 104 Average  WAIS-IV Subtests     Similarities 20/36 ss= 9 Average  Block Design 16/66 ss= 6 Low average  Coding 33/135 ss= 8 Average  Digit Span Forward 11/16  ss= 12 High average  Digit Span Backward 11/16 ss= 15 Superior  WMS-IV Subtests     LM I 23/53 ss= 8 Average  LM II 12/39 ss= 9 Average  LM II Recognition 14/23 Cum %: 10-16   RBANS Subtests     Figure Copy 20/20 Z= 1.2 High average  Figure Recall 11/20 Z= -0.4 Average  Semantic Fluency  10/40 Z= -1.9 Borderline  CVLT-II Scores     Trial 1 5/9 Z= -0.5 Average  Trial 4 6/9 Z= -1.5 Borderline  Trials 1-4 total 24/36 T= 45 Average  SD Free Recall 8/9 Z= 0.5 Average  LD Free Recall 5/9 Z= -0.5 Average  LD Cued Recall 5/9 Z= -1 Low average  Recognition Discriminability 9/9 hits, 1 false positives Z= 0.5 Average  Forced Choice Recognition 9/9  WNL  BNT 37/60 T= 30 Impaired  BDAE Subtest     Complex Ideational Material 10/12  WNL  COWAT-FAS 49 T= 56 Average  COWAT-Animals 9 T= 28 Impaired  Trail Making Test A  49" 0 errors T= 49 Average  Trail Making Test B  Pt unable   Impaired  Clock Drawing   Slightly impaired  BDI-II 15/63  Mild  GAD-7 10/21  Moderate      Description of Test Results:  Premorbid verbal intellectual abilities were estimated to have been within the average range based on a test of word reading. Psychomotor processing speed was average. Auditory attention and working memory were high average to superior. Visual-spatial construction was variable. On a test requiring her to manipulate three dimensional blocks to match two dimensional stimulus models, she performed in the low average range (below expectation), but her drawn copy of a complex geometric figure was superior. Language abilities were below expectation. Specifically, confrontation naming was impaired, and semantic verbal fluency was impaired. Auditory comprehension of complex ideational material was within normal limits. With regard to verbal memory, encoding and acquisition of non-contextual information (i.e., word list) was average across four learning trials although learning curve was relatively flat. After a brief distracter task, free recall was average (8/9 items). After a delay, free recall was average (5/9 items). Recall did not benefit from semantic cueing (5/9 items, low average). Performance on a yes/no recognition task was average. On another verbal memory test, encoding and acquisition  of contextual auditory information (i.e., short stories) was average. After a delay, free recall was average. Performance on a yes/no recognition task was below expectation. With regard to non-verbal memory, delayed free recall of visual information was average. Executive functioning was variable. Mental flexibility and set-shifting were impaired; she was unable to complete Trails B. Verbal fluency with phonemic search restrictions was average. Verbal abstract reasoning was average. Performance on a clock drawing task was slightly impaired on second attempt. On a self-report measure of mood, the patient's responses were indicative of clinically significant depression at the present time. Symptoms endorsed included: sadness all of the time, pessimism, wanting to cry but being unable to do so, restlessness, indecisiveness, reduced energy, reduced sleep, irritability, reduced appetite, concentration difficulty, fatigue and reduced libido. She denied suicidal ideation or intention. On a self-report measure of anxiety, the patient endorsed clinically significant generalized anxiety at the present time, characterized by nervousness, inability to control worrying, excessive memories, difficulty relaxing, restlessness and irritability.   Clinical Impressions: Non-amnestic mild cognitive impairment; Generalized anxiety disorder; Mild depression. Results of cognitive testing were mostly within normal limits for age and commensurate with estimated baseline functioning. However, there were deficits  in semantic retrieval and in mental flexibility/set-shifting. These test results, along with her functioning in daily life, do not warrant a diagnosis of dementia. 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  I believe her significant anxiety as well as insomnia and mild depression are having a (perhaps the most) significant impact on her cognitive ability and overall functioning. As was noted in Dr.  Cathren Laine notes, she has had variable MMSE scores in the past (20/30 in the past, then 29/30 more recently), which supports the possibility of mood playing a large role.  I am hopeful that treatment of anxiety will not only be beneficial for mood and overall functioning but also yield enhanced cognitive function in daily life. I see that Zoloft was recently started by her PCP on 10/30/2017 after an ED visit for anxiety a few days prior.   Recommendations/Plan: Based on the findings of the present evaluation, the following recommendations are offered:  1. Treatment for anxiety: Zoloft was recently started by PCP on 10/30/2017, and I encouraged the patient to continue taking this. I explained to her how SSRI's work, and that she will need to take it every day (she was just going to take it "as needed"). She knows to contact her PCP if she has adverse effects, but I also explained that some of the mild side effects when the medication is first started do resolve over 1-2 weeks. I recommend her PCP continue to monitor anxiety/mood. Zoloft may need to be increased in the future. I also recommended that the patient consider mental health counseling for anxiety, as studies show the combination of pharmacological agents and therapy produces benefits greater than the sum of both parts. She will think about this, and if she decides she wants a referral, I will be happy to place that for her.  2. If the patient continues to report memory/cognitive concerns despite improvement in anxiety, retesting in 1-2 years would be useful.   Feedback to Patient: JORDYNNE MCCOWN and her husband returned for a feedback appointment on 11/20/2017 to review the results of her neuropsychological evaluation with this provider. 25 minutes face-to-face time was spent reviewing her test results, my impressions and my recommendations as detailed above.    Total time spent on this patient's case: 60 minutes for neurobehavioral status exam  with psychologist (CPT code [id number]); 90 minutes of testing/scoring by psychometrician under psychologist's supervision (CPT codes [phone], [phone] units); 180 minutes for integration of patient data, interpretation of standardized test results and clinical data, clinical decision making, treatment planning and preparation of this report, and interactive feedback with review of results to the patient/family by psychologist (CPT codes [phone], [phone] units).      Thank you for your referral of PEGEEN STIGER. Please feel free to contact me if you have any questions or concerns regarding this report.

## 2017-11-20 ENCOUNTER — Encounter: Payer: Self-pay | Admitting: Psychology

## 2017-11-20 ENCOUNTER — Ambulatory Visit: Payer: PPO | Admitting: Psychology

## 2017-11-20 DIAGNOSIS — R413 Other amnesia: Secondary | ICD-10-CM | POA: Diagnosis not present

## 2017-11-20 DIAGNOSIS — F411 Generalized anxiety disorder: Secondary | ICD-10-CM

## 2017-11-20 DIAGNOSIS — G3184 Mild cognitive impairment, so stated: Secondary | ICD-10-CM

## 2017-11-20 NOTE — Patient Instructions (Signed)
Test results did NOT indicate dementia.   There were some difficulties on certain aspects of the cognitive testing, but I think the primary cause of your cognitive/memory symptoms is anxiety, sleep impairment and depression.   I am hopeful that treatment of anxiety will not only improve your mood and quality of life, but also result in improved cognitive functioning in your daily life.    The effect of depression and anxiety on your cognitive functioning: . One of the typical symptoms of depression is difficulty concentrating and making decisions, and various types of anxiety also interfere with attention and concentration . Problems with attention and concentration can disrupt the process of learning and making new memories, which can make it seem like there is a problem with your memory. In your daily life, you may experience this disruption as forgetting names and appointments, misplacing items, and needing to make lists for shopping and errands. It may be harder for you to stay focused on tasks and feel as "sharp" as you did in the past.  . Also, when we are depressed or anxious, we often pay more attention to our difficulties (rather than our strengths) in our daily life, and this can make it seem to [place] like we are doing worse cognitively than we really are. . The cognitive aspects of depression and anxiety are sometimes observed as an identifiable pattern of poor performance on a neuropsychological evaluation, but it is also possible that all scores on an evaluation are within normal limits. . Regardless of the test scores, distress related to depression and anxiety can interfere with the ability to make use of your cognitive resources and function optimally across settings such as work or school, maintaining the home and responsibilities, and personal relationships. . Fortunately, there are treatments for depression and anxiety, and when mood improves, cognitive functioning in daily life often  improves. . Treatment options include psychotherapy, medications (e.g., antidepressants), and behavioral changes, such as increasing your involvement in enjoyable activities, increasing the amount of exercise you are getting, and maintaining a regular routine.

## 2017-11-26 ENCOUNTER — Encounter: Payer: Self-pay | Admitting: Psychology

## 2017-11-27 ENCOUNTER — Telehealth: Payer: Self-pay | Admitting: Neurology

## 2017-11-27 NOTE — Telephone Encounter (Signed)
Jessica Hardin, can you call patient please? Can cancel July appointment, formal neurocognitive testing did not show dementia. Follow up in the future if needed thanks

## 2017-12-25 ENCOUNTER — Telehealth: Payer: Self-pay | Admitting: *Deleted

## 2017-12-25 NOTE — Telephone Encounter (Signed)
-----   Message from Melvenia Beam, MD sent at 11/25/2017  2:02 PM EDT ----- Patient does not need to see me in the office in July, please cancel appointment and call patient. Per neuropsych testing her depression and anxiety need to be treated. She needs to follow up with pcp regularly but she can return to [place] as needed in a year or two if pcp deems necessary or (per neuropsych instructions) if further evaluation needed. thanks   ----- Message ----- From: Kandis Nab, PsyD Sent: 11/20/2017   9:59 AM To: Melvenia Beam, MD  Hi there!  I just saw Jessica Hardin to go over results of testing. (Thank you for the referral!) She wanted to know if she needs to see you for her scheduled follow-up in July. I told her she could call your office to ask, and she said she had already called but didn't get a clear answer back. I told her I'd let you know her question and you or your office could get back to her on that. :)  Thanks again! Hope you're having a nice summer! MB

## 2017-12-25 NOTE — Telephone Encounter (Signed)
Pt's appt was a new consult for dizziness which was mentioned in the pt's last office visit for memory. Pt stated it's not really dizziness, but she said when she describes it, that is how it is interpreted by other providers. If she turns her head to the side really fast, her body wants to go that way. Offered an appt with NP this week, gave 3 appt options. She would prefer to see Dr. Jaynee Eagles. She also said she would f/u with Dr. Diona Browner for treatment of depression and anxiety. Pt scheduled on 7/30 @ 8:30 arrival time 8:00.

## 2018-01-15 ENCOUNTER — Ambulatory Visit (INDEPENDENT_AMBULATORY_CARE_PROVIDER_SITE_OTHER): Payer: PPO | Admitting: Neurology

## 2018-01-15 ENCOUNTER — Institutional Professional Consult (permissible substitution): Payer: PPO | Admitting: Neurology

## 2018-01-15 ENCOUNTER — Encounter: Payer: Self-pay | Admitting: Neurology

## 2018-01-15 VITALS — BP 132/75 | HR 61 | Ht 59.0 in | Wt 128.0 lb

## 2018-01-15 DIAGNOSIS — F439 Reaction to severe stress, unspecified: Secondary | ICD-10-CM | POA: Diagnosis not present

## 2018-01-15 DIAGNOSIS — R2689 Other abnormalities of gait and mobility: Secondary | ICD-10-CM | POA: Diagnosis not present

## 2018-01-15 NOTE — Patient Instructions (Signed)
Therapy, discuss with Dr. Diona Browner, recommend calling insurance to find a therapist on plan Referral to PT again for balance and gait evaluation   Fall Prevention in the Home Falls can cause injuries. They can happen to people of all ages. There are many things you can do to make your home safe and to help prevent falls. What can I do on the outside of my home?  Regularly fix the edges of walkways and driveways and fix any cracks.  Remove anything that might make you trip as you walk through a door, such as a raised step or threshold.  Trim any bushes or trees on the path to your home.  Use bright outdoor lighting.  Clear any walking paths of anything that might make someone trip, such as rocks or tools.  Regularly check to see if handrails are loose or broken. Make sure that both sides of any steps have handrails.  Any raised decks and porches should have guardrails on the edges.  Have any leaves, snow, or ice cleared regularly.  Use sand or salt on walking paths during winter.  Clean up any spills in your garage right away. This includes oil or grease spills. What can I do in the bathroom?  Use night lights.  Install grab bars by the toilet and in the tub and shower. Do not use towel bars as grab bars.  Use non-skid mats or decals in the tub or shower.  If you need to sit down in the shower, use a plastic, non-slip stool.  Keep the floor dry. Clean up any water that spills on the floor as soon as it happens.  Remove soap buildup in the tub or shower regularly.  Attach bath mats securely with double-sided non-slip rug tape.  Do not have throw rugs and other things on the floor that can make you trip. What can I do in the bedroom?  Use night lights.  Make sure that you have a light by your bed that is easy to reach.  Do not use any sheets or blankets that are too big for your bed. They should not hang down onto the floor.  Have a firm chair that has side arms. You  can use this for support while you get dressed.  Do not have throw rugs and other things on the floor that can make you trip. What can I do in the kitchen?  Clean up any spills right away.  Avoid walking on wet floors.  Keep items that you use a lot in easy-to-reach places.  If you need to reach something above you, use a strong step stool that has a grab bar.  Keep electrical cords out of the way.  Do not use floor polish or wax that makes floors slippery. If you must use wax, use non-skid floor wax.  Do not have throw rugs and other things on the floor that can make you trip. What can I do with my stairs?  Do not leave any items on the stairs.  Make sure that there are handrails on both sides of the stairs and use them. Fix handrails that are broken or loose. Make sure that handrails are as long as the stairways.  Check any carpeting to make sure that it is firmly attached to the stairs. Fix any carpet that is loose or worn.  Avoid having throw rugs at the top or bottom of the stairs. If you do have throw rugs, attach them to the floor with carpet  tape.  Make sure that you have a light switch at the top of the stairs and the bottom of the stairs. If you do not have them, ask someone to add them for you. What else can I do to help prevent falls?  Wear shoes that: ? Do not have high heels. ? Have rubber bottoms. ? Are comfortable and fit you well. ? Are closed at the toe. Do not wear sandals.  If you use a stepladder: ? Make sure that it is fully opened. Do not climb a closed stepladder. ? Make sure that both sides of the stepladder are locked into place. ? Ask someone to hold it for you, if possible.  Clearly mark and make sure that you can see: ? Any grab bars or handrails. ? First and last steps. ? Where the edge of each step is.  Use tools that help you move around (mobility aids) if they are needed. These  include: ? Canes. ? Walkers. ? Scooters. ? Crutches.  Turn on the lights when you go into a dark area. Replace any light bulbs as soon as they burn out.  Set up your furniture so you have a clear path. Avoid moving your furniture around.  If any of your floors are uneven, fix them.  If there are any pets around you, be aware of where they are.  Review your medicines with your doctor. Some medicines can make you feel dizzy. This can increase your chance of falling. Ask your doctor what other things that you can do to help prevent falls. This information is not intended to replace advice given to you by your health care provider. Make sure you discuss any questions you have with your health care provider. Document Released: 04/01/2009 Document Revised: 11/11/2015 Document Reviewed: 07/10/2014 Elsevier Interactive Patient Education  [employer].

## 2018-01-15 NOTE — Progress Notes (Signed)
[email or web address]    Provider:  Dr Jaynee Eagles Referring Provider: Jinny Sanders, MD Primary Care Physician:  Jinny Sanders, MD  CC:  Memory loss  Interval History 01/15/2018: New issue today dizziness. If she turns suddenly. Gormal neurocognitive testing did not show dementia but likely anxiety and depression. MRI of the brain 2019 normal for age. Her biggest problem is stress and she is trying to deal with this. If she turns suddenly then her body tends to go that way, no spinning, no dizziness, mostly more imbalance. She needs therapy for her memory complaints. Follow with primary care, or call insurance and see what therapists are on her insurance plan. She does not exercise   HPI:  RAYNEISHA BOUZA is a 77 y.o. female here as a referral from Dr. Diona Browner for memory changes. PMHx migraine, IBS, asthma. She will be driving and she gets "turned around" even if places she normally goes. She loses things in the house. She has more difficulty with numbers such as counting backwards. Having more difficulty with math problems. Symptoms started 2 years ago and gradually worsening. Her husband has noticed and it frustrated him. Mohter died at 62, she had memory loss but no dementia, father was 47 and had no memory loss. Lives at home with husband. No car accidents. Husband has paid the bills for years and this is unassociated with recent symptoms. Having difficulty with date, she knows the day and year but has more difficulty and has to ask herself "what is today?". She may repeat things.  Denies significant anxiety or mood disorder. Also feels her balance is affected and she stumbles backwards. She tosses and turns and does not sleep well lately, she denies snoring, she is worried about her memory and she is annoyed at her husband bc he complains about her memory and this keeps her up at night worrying. She does also worry about her daughter who has multiple sclerosis, worrying about a lot of things  and stress. She has dizziness when she very anxious. other focal neurologic deficits, associated symptoms, inciting events or modifiable factors.   Reviewed notes, labs and imaging from outside physicians, which showed:  B12, tsh, rpr normal  CT head 07/2009:   Atrophy with chronic small vessel ischemic disease. No  acute intracranial abnormality.  Review of Systems: Patient complains of symptoms per HPI as well as the following symptoms: memory loss, anxiety. Pertinent negatives and positives per HPI. All others negative.   Social History   Socioeconomic History  . Marital status: Married    Spouse name: Not on file  . Number of children: 3  . Years of education: Not on file  . Highest education level: Associate degree: occupational, Hotel manager, or vocational program  Occupational History  . Not on file  Social Needs  . Financial resource strain: Not on file  . Food insecurity:    Worry: Not on file    Inability: Not on file  . Transportation needs:    Medical: Not on file    Non-medical: Not on file  Tobacco Use  . Smoking status: Never Smoker  . Smokeless tobacco: Never Used  Substance and Sexual Activity  . Alcohol use: No    Alcohol/week: 0.0 oz  . Drug use: No  . Sexual activity: Yes    Birth control/protection: Surgical  Lifestyle  . Physical activity:    Days per week: Not on file    Minutes per session: Not on file  . Stress:  Not on file  Relationships  . Social connections:    Talks on phone: Not on file    Gets together: Not on file    Attends religious service: Not on file    Active member of club or organization: Not on file    Attends meetings of clubs or organizations: Not on file    Relationship status: Not on file  . Intimate partner violence:    Fear of current or ex partner: Not on file    Emotionally abused: Not on file    Physically abused: Not on file    Forced sexual activity: Not on file  Other Topics Concern  . Not on file  Social  History Narrative   Lives at home with her husband   Right handed   Caffeine: none    Family History  Problem Relation Age of Onset  . Arthritis Mother   . Hypertension Mother   . Arthritis Sister   . Hypertension Sister   . Colon cancer Brother 53  . Breast cancer Sister   . Breast cancer Sister   . Uterine cancer Sister 2  . Multiple sclerosis Daughter     Past Medical History:  Diagnosis Date  . Allergy   . Asthma   . GERD (gastroesophageal reflux disease)   . History of chicken pox   . IBS (irritable bowel syndrome)   . Migraine   . Urinary tract infection     Past Surgical History:  Procedure Laterality Date  . ABDOMINAL HYSTERECTOMY    . CATARACT EXTRACTION, BILATERAL    . TUBAL LIGATION      Current Outpatient Medications  Medication Sig Dispense Refill  . albuterol (PROVENTIL HFA;VENTOLIN HFA) 108 (90 Base) MCG/ACT inhaler Inhale 2 puffs every 6 (six) hours as needed into the lungs for wheezing or shortness of breath. 1 Inhaler 2  . Apoaequorin (PREVAGEN PO) Take 1 tablet by mouth daily.     . fluticasone (FLONASE) 50 MCG/ACT nasal spray Place 2 sprays into both nostrils daily as needed. Reported on 09/08/2015 16 g 0  . Multiple Vitamin (MULTIVITAMIN) capsule Take 1 capsule by mouth daily.     . polyethylene glycol (MIRALAX / GLYCOLAX) packet Take 17 g by mouth daily as needed for mild constipation.    . sertraline (ZOLOFT) 25 MG tablet Take 1 tablet (25 mg total) by mouth daily. 30 tablet 11  . DANDELION PO Take by mouth daily.    Marland Kitchen triamcinolone cream (KENALOG) 0.1 % Apply 1 application topically 2 (two) times daily.     No current facility-administered medications for this visit.     Allergies as of 01/15/2018 - Review Complete 01/15/2018  Allergen Reaction Noted  . Shellfish allergy Hives and Other (See Comments) 08/13/2014    Vitals: BP 132/75 (BP Location: Right Arm, Patient Position: Sitting)   Pulse 61   Ht 4\' 11"  (1.499 m)   Wt 128 lb (58.1  kg)   BMI 25.85 kg/m  Last Weight:  Wt Readings from Last 1 Encounters:  01/15/18 128 lb (58.1 kg)   Last Height:   Ht Readings from Last 1 Encounters:  01/15/18 4\' 11"  (1.499 m)   Physical exam: Exam: Gen: NAD, conversant, well nourised, thin, well groomed                     CV: RRR, no MRG. No Carotid Bruits. No peripheral edema, warm, nontender Eyes: Conjunctivae clear without exudates or hemorrhage  Neuro: Detailed  Neurologic Exam  Speech:    Speech is normal; fluent and spontaneous with normal comprehension.  Cognition:  MMSE - Mini Mental State Exam 06/13/2017 11/04/2015  Orientation to time 5 5  Orientation to Place 5 5  Registration 3 3  Attention/ Calculation 5 0  Recall 2 3  Language- name 2 objects 2 0  Language- repeat 1 1  Language- follow 3 step command 3 3  Language- read & follow direction 1 0  Write a sentence 1 0  Copy design 1 0  Total score 29 20      The patient is oriented to person, place, and time;     recent and remote memory a[ppears intact;     language fluent;     normal attention, concentration, fund of knowledge Cranial Nerves:    The pupils are equal, round, and reactive to light. Attempted fundoscopic exam could not visualize. Visual fields are full to finger confrontation. Extraocular movements are intact. Trigeminal sensation is intact and the muscles of mastication are normal. The face is symmetric. The palate elevates in the midline. Hearing intact. Voice is normal. Shoulder shrug is normal. The tongue has normal motion without fasciculations.   Coordination:    Normal finger to nose and heel to shin. Normal rapid alternating movements.   Gait:    Not ataxic  Motor Observation:    No asymmetry, no atrophy, and no involuntary movements noted. Tone:    Normal muscle tone.    Posture:    Posture is normal. normal erect    Strength:    Strength is V/V in the upper and lower limbs.      Sensation: intact to LT     Reflex  Exam:  DTR's:    Deep tendon reflexes in the upper and lower extremities are symmetrical bilaterally.   Toes:    The toes are equiv bilaterally.   Clonus:    Clonus is absent.       Assessment/Plan:  77 year old who has been c/o memory changes for several years since 2016. She reports progression. Mother had memory problems around the same age as patient is now. MMSE was 29/30 (in 2017 20/30). Questionable if anxiety is a significant factor considering the wide variation in MMSEs (29 today, 20 in 2017).   - Formal neurocognitive testing did not show dementia but implicated stress, depression, anxiety. Patient to follow up with pcp and can also call insurance to find a therapist - Non specific symptoms today, exam stable, she says when she turns her head abruptly she is not dizzy or lightheaded no vertigo but her "body goes that way" may be a little imbalance and she is very sedate without exercising will send to PT for gait and balance.  - Neurocognitive testing did not show dementia but likely anxiety and depression. MRI of the brain 2019 normal for age. Her biggest problem is stress and she is trying to deal with this. If she turns suddenly then her body tends to go that way, no spinning, no dizziness, mostly more imbalance. Again, She needs therapy for her memory complaints.   - Follow with primary care, or call insurance and see what therapists are on her insurance plan.   - fall prevention in the home  Will release patient back to pcp, no further workup in neurology needed   Sarina Ill, MD  [facility] [address] [place], [place] [address]  Phone [phone] Fax [phone]  A total of  25  minutes was spent face-to-face with this patient. Over half this time was spent on counseling patient on the stress, imbalance diagnosis and different diagnostic and therapeutic options, counseling and coordination of care, risks ans benefits of  management, compliance, or risk factor reduction and education.

## 2018-01-17 ENCOUNTER — Telehealth: Payer: Self-pay

## 2018-01-17 DIAGNOSIS — R42 Dizziness and giddiness: Secondary | ICD-10-CM

## 2018-01-17 NOTE — Telephone Encounter (Signed)
Copied from [place] [id number]. Topic: Referral - Request >> Jan 17, 2018  1:22 PM Jessica Hardin wrote: Reason for CRM: pt called to get a referral for a therapist for her ear and arm;  contact pt to advise

## 2018-01-17 NOTE — Telephone Encounter (Signed)
Called pt to try and get more information.  She said she has been seeing Neurology. They said she needed to have her PCP do a referral for PT for vestibular imbalance of getting dizzy when she turns her head.  She also said she is getting more forgetful, possibly from stress. Suggest she she behavioral health to help cope with stress.

## 2018-01-22 NOTE — Telephone Encounter (Signed)
Will make referral as request.

## 2018-02-04 ENCOUNTER — Emergency Department: Payer: PPO

## 2018-02-04 ENCOUNTER — Emergency Department
Admission: EM | Admit: 2018-02-04 | Discharge: 2018-02-04 | Disposition: A | Discharge status: Home / Self Care | Payer: PPO | Attending: Emergency Medicine | Admitting: Emergency Medicine

## 2018-02-04 ENCOUNTER — Encounter: Payer: Self-pay | Admitting: Emergency Medicine

## 2018-02-04 ENCOUNTER — Other Ambulatory Visit: Payer: Self-pay

## 2018-02-04 DIAGNOSIS — N390 Urinary tract infection, site not specified: Secondary | ICD-10-CM

## 2018-02-04 DIAGNOSIS — Z79899 Other long term (current) drug therapy: Secondary | ICD-10-CM | POA: Diagnosis not present

## 2018-02-04 DIAGNOSIS — R1030 Lower abdominal pain, unspecified: Secondary | ICD-10-CM | POA: Insufficient documentation

## 2018-02-04 DIAGNOSIS — R109 Unspecified abdominal pain: Secondary | ICD-10-CM | POA: Diagnosis not present

## 2018-02-04 DIAGNOSIS — R1084 Generalized abdominal pain: Secondary | ICD-10-CM | POA: Diagnosis not present

## 2018-02-04 DIAGNOSIS — J45909 Unspecified asthma, uncomplicated: Secondary | ICD-10-CM | POA: Diagnosis not present

## 2018-02-04 LAB — URINALYSIS, COMPLETE (UACMP) WITH MICROSCOPIC
BILIRUBIN URINE: NEGATIVE
Bacteria, UA: NONE SEEN
GLUCOSE, UA: NEGATIVE mg/dL
HGB URINE DIPSTICK: NEGATIVE
Ketones, ur: NEGATIVE mg/dL
NITRITE: NEGATIVE
PH: 7 (ref 5.0–8.0)
Protein, ur: NEGATIVE mg/dL
SPECIFIC GRAVITY, URINE: 1.002 — AB (ref 1.005–1.030)

## 2018-02-04 LAB — LIPASE, BLOOD: Lipase: 43 U/L (ref 11–51)

## 2018-02-04 LAB — CBC
HCT: 38.1 % (ref 35.0–47.0)
HEMOGLOBIN: 12.6 g/dL (ref 12.0–16.0)
MCH: 30.1 pg (ref 26.0–34.0)
MCHC: 33 g/dL (ref 32.0–36.0)
MCV: 91.2 fL (ref 80.0–100.0)
Platelets: 175 10*3/uL (ref 150–440)
RBC: 4.17 MIL/uL (ref 3.80–5.20)
RDW: 13.7 % (ref 11.5–14.5)
WBC: 4.2 10*3/uL (ref 3.6–11.0)

## 2018-02-04 LAB — COMPREHENSIVE METABOLIC PANEL
ALK PHOS: 61 U/L (ref 38–126)
ALT: 14 U/L (ref 0–44)
ANION GAP: 6 (ref 5–15)
AST: 23 U/L (ref 15–41)
Albumin: 4 g/dL (ref 3.5–5.0)
BILIRUBIN TOTAL: 0.6 mg/dL (ref 0.3–1.2)
BUN: 11 mg/dL (ref 8–23)
CALCIUM: 9.5 mg/dL (ref 8.9–10.3)
CO2: 30 mmol/L (ref 22–32)
Chloride: 106 mmol/L (ref 98–111)
Creatinine, Ser: 0.86 mg/dL (ref 0.44–1.00)
GFR calc non Af Amer: >60 mL/min (ref >60)
GLUCOSE: 93 mg/dL (ref 70–99)
Potassium: 3.7 mmol/L (ref 3.5–5.1)
Sodium: 142 mmol/L (ref 135–145)
TOTAL PROTEIN: 7.3 g/dL (ref 6.5–8.1)

## 2018-02-04 LAB — TROPONIN I

## 2018-02-04 MED ORDER — IOPAMIDOL (ISOVUE-300) INJECTION 61%: 100.0000 mL | Freq: Once | INTRAVENOUS | Status: DC | PRN

## 2018-02-04 MED ORDER — ONDANSETRON HCL 4 MG/2ML IJ SOLN
4.0000 mg | Freq: Once | INTRAMUSCULAR | Status: AC
  Administered 2018-02-04: 4 mg via INTRAVENOUS
  Filled 2018-02-04: qty 2

## 2018-02-04 MED ORDER — IOHEXOL 300 MG/ML  SOLN
75.0000 mL | Freq: Once | INTRAMUSCULAR | Status: AC | PRN
  Administered 2018-02-04: 75 mL via INTRAVENOUS

## 2018-02-04 MED ORDER — MORPHINE SULFATE (PF) 2 MG/ML IV SOLN
2.0000 mg | Freq: Once | INTRAVENOUS | Status: AC
  Administered 2018-02-04: 2 mg via INTRAVENOUS
  Filled 2018-02-04: qty 1

## 2018-02-04 MED ORDER — CEPHALEXIN 500 MG PO CAPS
500.0000 mg | ORAL_CAPSULE | Freq: Once | ORAL | Status: AC
  Administered 2018-02-04: 500 mg via ORAL
  Filled 2018-02-04: qty 1

## 2018-02-04 MED ORDER — IOPAMIDOL (ISOVUE-300) INJECTION 61%
30.0000 mL | Freq: Once | INTRAVENOUS | Status: AC
  Administered 2018-02-04: 30 mL via ORAL

## 2018-02-04 MED ORDER — CEPHALEXIN 500 MG PO CAPS: 500.0000 mg | ORAL_CAPSULE | Freq: Three times a day (TID) | ORAL | 0 refills | Status: DC

## 2018-02-04 NOTE — Discharge Instructions (Addendum)
Please seek medical attention for any high fevers, chest pain, shortness of breath, change in behavior, persistent vomiting, bloody stool or any other new or concerning symptoms.  

## 2018-02-04 NOTE — ED Provider Notes (Signed)
[facility] Emergency Department Provider Note   ____________________________________________   First MD Initiated Contact with Patient 02/04/18 1924     (approximate)  I have reviewed the triage vital signs and the nursing notes.   HISTORY  Chief Complaint Abdominal Pain    HPI Jessica Hardin is a 77 y.o. female patient reports lower abdominal pain beginning about 2 hours ago.  Patient says she has had similar pain intermittently for 2 years.  She also has anxiety.  When I asked her where the pain was she says it is here and rubs her whole belly.  She does not appear to have any pain in her chest or anywhere else.  Pain seems to be achy but I cannot get a really good answer from her to describe it.  Additionally I cannot find out what makes it better or worse.  It seems to be moderate in intensity  Past Medical History:  Diagnosis Date  . Allergy   . Asthma   . GERD (gastroesophageal reflux disease)   . History of chicken pox   . IBS (irritable bowel syndrome)   . Migraine   . Urinary tract infection     Patient Active Problem List   Diagnosis Date Noted  . GAD (generalized anxiety disorder) 10/30/2017  . 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 Chronic insomnia 10/30/2017  . TMJ tenderness, right 05/04/2017  . Cough 05/04/2017  . Post-nasal drip 05/04/2017  . Ear ringing sound, right 05/04/2017  . Dysthymia 01/30/2017  . 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 Epidermoid cyst of finger of left hand 07/27/2016  . Osteopenia 11/18/2015  . Chronic constipation 07/20/2015  . Internal hemorrhoid, bleeding 07/20/2015  . 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 Counseling regarding end of life decision making 09/08/2014  . Childhood asthma 08/13/2014  . Gastroesophageal reflux disease without esophagitis 08/13/2014  . Irritable bowel syndrome with constipation 08/13/2014  . Hx of migraines 08/13/2014  . Family  history of colon cancer 08/13/2014  . Family history of uterine cancer 08/13/2014  . Family history of breast cancer 08/13/2014    Past Surgical History:  Procedure Laterality Date  . ABDOMINAL HYSTERECTOMY    . CATARACT EXTRACTION, BILATERAL    . TUBAL LIGATION      Prior to Admission medications   Medication Sig Start Date End Date Taking? Authorizing Provider  albuterol (PROVENTIL HFA;VENTOLIN HFA) 108 (90 Base) MCG/ACT inhaler Inhale 2 puffs every 6 (six) hours as needed into the lungs for wheezing or shortness of breath. 04/27/17   Merlyn Lot, MD  Apoaequorin (PREVAGEN PO) Take 1 tablet by mouth daily.     [provider]  DANDELION PO Take by mouth daily.    [provider]  fluticasone (FLONASE) 50 MCG/ACT nasal spray Place 2 sprays into both nostrils daily as needed. Reported on 09/08/2015 09/08/15   Tonia Ghent, MD  Multiple Vitamin (MULTIVITAMIN) capsule Take 1 capsule by mouth daily.     [provider]  polyethylene glycol (MIRALAX / GLYCOLAX) packet Take 17 g by mouth daily as needed for mild constipation.    [provider]  sertraline (ZOLOFT) 25 MG tablet Take 1 tablet (25 mg total) by mouth daily. 10/30/17   Bedsole, Amy E, MD  triamcinolone cream (KENALOG) 0.1 % Apply 1 application topically 2 (two) times daily.    [provider]    Allergies Shellfish allergy  Family History  Problem Relation Age of Onset  .  Arthritis Mother   . Hypertension Mother   . Arthritis Sister   . Hypertension Sister   . Colon cancer Brother 38  . Breast cancer Sister   . Breast cancer Sister   . Uterine cancer Sister 52  . Multiple sclerosis Daughter     Social History Social History   Tobacco Use  . Smoking status: Never Smoker  . Smokeless tobacco: Never Used  Substance Use Topics  . Alcohol use: No    Alcohol/week: 0.0 standard drinks  . Drug use: No    Review of Systems  Constitutional: No fever/chills Eyes:  No visual changes. ENT: No sore throat. Cardiovascular: Denies chest pain. Respiratory: Denies shortness of breath. Gastrointestinal:abdominal pain.  No nausea, no vomiting.  Genitourinary: Negative for dysuria. Musculoskeletal: Negative for back pain. Skin: Negative for rash. Neurological: Negative for headaches, focal weakness  ____________________________________________   PHYSICAL EXAM:  VITAL SIGNS: ED Triage Vitals  Enc Vitals Group     BP 02/04/18 1757 (!) 174/72     Pulse Rate 02/04/18 1757 62     Resp 02/04/18 1757 18     Temp 02/04/18 1757 98.5 F (36.9 C)     Temp Source 02/04/18 1757 Oral     SpO2 02/04/18 1757 99 %     Weight 02/04/18 1758 127 lb 12.8 oz (58 kg)     Height 02/04/18 1758 4\' 11"  (1.499 m)     Head Circumference --      Peak Flow --      Pain Score 02/04/18 1758 5     Pain Loc --      Pain Edu? --      Excl. in [place]? --    Constitutional: Alert and oriented. Well appearing and in no acute distress. Eyes: Conjunctivae are normal.  Head: Atraumatic. Nose: No congestion/rhinnorhea. Mouth/Throat: Mucous membranes are moist.  Oropharynx non-erythematous. Neck: No stridor.   Cardiovascular: Normal rate, regular rhythm. Grossly normal heart sounds.  Good peripheral circulation. Respiratory: Normal respiratory effort.  No retractions. Lungs CTAB. Gastrointestinal: Soft mildly tender diffusely per patient report.  She does not wince if I palpate deeply.  No distention. No abdominal bruits. No CVA tenderness. Musculoskeletal: No lower extremity tenderness nor edema.  No joint effusions. Neurologic:  Normal speech and language. No gross focal neurologic deficits are appreciated. No gait instability. Skin:  Skin is warm, dry and intact. No rash noted. Psychiatric: Mood and affect are normal. Speech and behavior are normal.  ____________________________________________   LABS (all labs ordered are listed, but only abnormal results are displayed)  Labs  Reviewed  URINALYSIS, COMPLETE (UACMP) WITH MICROSCOPIC - Abnormal; Notable for the following components:      Result Value   Color, Urine COLORLESS (*)    APPearance CLEAR (*)    Specific Gravity, Urine 1.002 (*)    Leukocytes, UA SMALL (*)    All other components within normal limits  LIPASE, BLOOD  COMPREHENSIVE METABOLIC PANEL  CBC  TROPONIN I   ____________________________________________  EKG   ____________________________________________  RADIOLOGY  ED MD interpretation:     Official radiology report(s): No results found.  ____________________________________________   PROCEDURES  Procedure(s) performed:   Procedures  Critical Care performed:   ____________________________________________   INITIAL IMPRESSION / ASSESSMENT AND PLAN / ED COURSE  At the patient's age I will CT her belly.  Dr. Archie Balboa will follow up on the CT.     Clinical Course as of Feb 04 2013  Mon Feb 04, 2018  2010 BUN: 11 [PM]    Clinical Course User Index [PM] Nena Polio, MD     ____________________________________________   FINAL CLINICAL IMPRESSION(S) / ED DIAGNOSES  Final diagnoses:  Abdominal pain, unspecified abdominal location     ED Discharge Orders    None       Note:  This document was prepared using Dragon voice recognition software and may include unintentional dictation errors.    Nena Polio, MD 02/04/18 2014

## 2018-02-04 NOTE — ED Triage Notes (Addendum)
Patient presents to the ED with lower generalized abdominal pain that began approx. 2 hours ago as well as anxiety.  Patient states she has had similar pain intermittently for 2 years.  Patient states she was taking, "prevagin" and it seemed better but now she is taking a different anxiety medication.  Patient states she is supposed to see a therapist next week for her anxiety.  Patient reports nausea but no vomiting.  Denies diarrhea.

## 2018-02-06 ENCOUNTER — Emergency Department: Admission: EM | Admit: 2018-02-06 | Discharge: 2018-02-06 | Discharge status: Against Medical Advice | Payer: PPO

## 2018-02-06 ENCOUNTER — Telehealth: Payer: Self-pay

## 2018-02-06 NOTE — ED Notes (Signed)
FIRST NURSE NOTE:  Pt notified registration desk that she would not be waiting to be seen and would come back tomorrow states she has another appointment she has to get to.

## 2018-02-06 NOTE — Telephone Encounter (Signed)
Copied from [place] [id number]. Topic: General - Other >> Feb 06, 2018  2:50 PM Mcneil, Ja-Kwan wrote: Reason for CRM: Pt states she has black marks on the inside of elbow area of both arms where the dye contrast was placed. Pt requests call back. Cb# [phone]

## 2018-02-06 NOTE — Telephone Encounter (Signed)
Pt has ED F/U on 02/19/18.Please advise.

## 2018-02-07 ENCOUNTER — Ambulatory Visit: Payer: PPO | Attending: Family Medicine

## 2018-02-07 VITALS — BP 149/70 | HR 69

## 2018-02-07 DIAGNOSIS — Z9181 History of falling: Secondary | ICD-10-CM | POA: Insufficient documentation

## 2018-02-07 DIAGNOSIS — R2681 Unsteadiness on feet: Secondary | ICD-10-CM | POA: Diagnosis not present

## 2018-02-07 NOTE — Telephone Encounter (Signed)
Spoke with Jessica Hardin.  She states she has dark spots on the inside of both arms after her CT.   One arm had an IV and the other arm was used for injecting the contrast.  She denies pain, redness or swelling.  I advised that I thought it sounded more like bruising and should go away in a few days.  She will call us back if anything changes.  She also wanted me to ask Dr. Diona Browner if there is any drug interactions between Keflex and Sertraline.  Please advise.

## 2018-02-07 NOTE — Telephone Encounter (Signed)
No interaction between those meds. Agree with assessment, Okay to follow areas.

## 2018-02-07 NOTE — Patient Instructions (Signed)
Access Code: [email or web address]  URL: [email or web address]  Date: 02/07/2018  Prepared by: Roxana Hires   Exercises  Half Tandem Stance Balance with Head Rotation - 3 reps - 30s on each side hold - 2x daily - 7x weekly  Tandem Stance - 3 reps - 30s on each side hold - 2x daily - 7x weekly

## 2018-02-07 NOTE — Telephone Encounter (Signed)
Please obtain more information. ? Bruising? Redness and warmth? Pain? If hard to tell.. Pt may need to be seen.

## 2018-02-07 NOTE — Therapy (Signed)
[place] MAIN [facility] SERVICES [address] [place], [place], [address] Phone: [phone]   Fax:  [phone]  Physical Therapy Evaluation  Patient Details  Name: Jessica Hardin MRN: [id number] Date of Birth: [birth year] Referring Provider: Dr. Diona Browner   Encounter Date: 02/07/2018  PT End of Session - 02/07/18 1014    Visit Number  1    Number of Visits  17    Date for PT Re-Evaluation  04/04/18    Authorization Type  Progress note: 1/10    PT Start Time  0825    PT Stop Time  0930    PT Time Calculation (min)  65 min    Equipment Utilized During Treatment  Gait belt    Activity Tolerance  Patient tolerated treatment well    Behavior During Therapy  [facility] for tasks assessed/performed       Past Medical History:  Diagnosis Date  . Allergy   . Asthma   . GERD (gastroesophageal reflux disease)   . History of chicken pox   . IBS (irritable bowel syndrome)   . Migraine   . Urinary tract infection     Past Surgical History:  Procedure Laterality Date  . ABDOMINAL HYSTERECTOMY    . CATARACT EXTRACTION, BILATERAL    . TUBAL LIGATION      Vitals:   02/07/18 0831  BP: (!) 149/70  Pulse: 69  SpO2: 100%     Subjective Assessment - 02/07/18 1013    Subjective  Imbalance    Pertinent History  Pt reports that she is having trouble with her balance. She states that it started approximately one year ago without any known trigger and has been progressive since that time. She has the most difficulty with head turns while walking. She denies dizziness or vertigo. She believes that her symptoms are related to her anxiety. She reports recent visit to the ER secondary to anxiety and GI upset. She had an abdominal CT which was negative for acute findings. She reports recent problems with her memory as well. She saw [facility] who reports that her formal neurocognitive testing did not show dementia but implicated stress, depression, and  anxiety. Per neurology note brain MRI in 2019 was normal for her age. She states that her PCP started her on Zoloft approximately 6 months ago. She reports that she took one month and then never refilled the medication. She recently restarted her Zoloft one week ago. She has not disclosed that she stopped her Zoloft to her PCP. She does have a follow-up appt scheduled 02/19/18. ROS negative for red flags (bowel/bladder changes, saddle paresthesia, personal history of cancer, chills/fever, night sweats, unrelenting pain, weight gain/loss). Pt does report decreased appetite recently due to anxiety. She also reports poor sleep stating she only sleeps approximately 4 hours/night.     Limitations  Walking    Diagnostic tests  Abd CT: no acute changes, Brain MRI: WNL for age    Patient Stated Goals  Improve her balance    Currently in Pain?  No/denies       Objective  MUSCULOSKELETAL: Tremor: Absent Bulk: Normal Tone: Normal  Posture No gross abnormalities noted in standing or seated posture  Gait No gross abnormalities in gait noted with   Strength R/L 5/5 Hip flexion 5/5 Knee extension 5/5 Knee flexion 5/5 Ankle Dorsiflexion  NEUROLOGICAL:  Mental Status Patient is oriented to person, place and time.  Recent memory is somewhat divided. Pt reports occasional difficulty with  her memory and even in the context of her visit states that she cannot remember when asked questions.   Remote memory appears intact.  Attention span and concentration are intact.  Expressive speech is intact.  Patient's fund of knowledge is within normal limits for educational level.  Cranial Nerves Visual acuity and visual fields are intact  Extraocular muscles are intact  Facial sensation is intact bilaterally  Facial strength is intact bilaterally  Hearing is normal as tested by gross conversation Palate elevates midline, normal phonation  Shoulder shrug strength is intact  Tongue protrudes midline    Sensation Grossly intact to light touch bilateral UEs/LEs as determined by testing dermatomes C2-T2/L2-S2 respectively Proprioception and hot/cold testing deferred on this date  Reflexes Deferred on this date  Coordination/Cerebellar Finger to Nose: WNL Heel to Shin: WNL Rapid alternating movements: WNL Pronator Drift: Negative  FUNCTIONAL OUTCOME MEASURES   Results Comments  BERG 52/56 Mild impairment  DGI    FGA 19/30 Fall risk, in need of intervention  TUG 8.3 seconds WNL  5TSTS 12.7 seconds WNL  10 Meter Gait Speed Self-selected: 9s = m/s; Fastest: 8.1s = m/s WNL  ABC Scale 65.63% Below cut-off for fall risk  DHI    6 Minute Walk Test    mCTSIB        OCULOMOTOR / VESTIBULAR TESTING:  Oculomotor Exam- Room Light  Normal Abnormal Comments  Ocular Alignment N    Ocular ROM N    Spontaneous Nystagmus N    End-Gaze Nystagmus N    Smooth Pursuit  A Mildly saccadic  Saccades N    VOR N    VOR Cancellation N    Left Head Thrust N    Right Head Thrust N    Head Shaking Nystagmus   Deferred  Static Acuity   Deferred  Dynamic Acuity   Deferred    BPPV TESTS:  Symptoms Duration Intensity Nystagmus  L Dix-Hallpike None   None  R Dix-Hallpike None   None  L Head Roll      R Head Roll      L Sidelying Test      R Sidelying Test              [facility] PT Assessment - 02/07/18 0837      Assessment   Medical Diagnosis  Dizziness    Referring Provider  Dr. Diona Browner    Onset Date/Surgical Date  02/07/17   Approximate   Hand Dominance  Right    Next MD Visit  02/19/18    Prior Therapy  None      Precautions   Precautions  None      Restrictions   Weight Bearing Restrictions  No      Balance Screen   Has the patient fallen in the past 6 months  No      [place] residence    Living Arrangements  Spouse/significant other    Available Help at Discharge  Family      Prior Function   Level of Independence   Independent      Cognition   Overall Cognitive Status  Within Functional Limits for tasks assessed   Pt complains of memory issues     Observation/Other Assessments   Other Surveys   Other Surveys      Standardized Balance Assessment   Standardized Balance Assessment  Berg Balance Test;Timed Up and Go Test;Five Times Sit to Stand;10 meter walk test  Five times sit to stand comments   12.7s      Berg Balance Test   Sit to Stand  Able to stand without using hands and stabilize independently    Standing Unsupported  Able to stand safely 2 minutes    Sitting with Back Unsupported but Feet Supported on Floor or Stool  Able to sit safely and securely 2 minutes    Stand to Sit  Sits safely with minimal use of hands    Transfers  Able to transfer safely, minor use of hands    Standing Unsupported with Eyes Closed  Able to stand 10 seconds with supervision    Standing Ubsupported with Feet Together  Able to place feet together independently and stand 1 minute safely    From Standing, Reach Forward with Outstretched Arm  Can reach confidently >25 cm (10")    From Standing Position, Pick up Object from Floor  Able to pick up shoe safely and easily    From Standing Position, Turn to Look Behind Over each Shoulder  Looks behind from both sides and weight shifts well    Turn 360 Degrees  Able to turn 360 degrees safely in 4 seconds or less    Standing Unsupported, Alternately Place Feet on Step/Stool  Able to stand independently and safely and complete 8 steps in 20 seconds    Standing Unsupported, One Foot in Front  Able to plae foot ahead of the other independently and hold 30 seconds    Standing on One Leg  Able to lift leg independently and hold equal to or more than 3 seconds    Total Score  52      Timed Up and Go Test   TUG  Normal TUG    Normal TUG (seconds)  8.3      Functional Gait  Assessment   Gait assessed   Yes    Gait Level Surface  Walks 20 ft in less than 5.5 sec, no  assistive devices, good speed, no evidence for imbalance, normal gait pattern, deviates no more than 6 in outside of the 12 in walkway width.    Change in Gait Speed  Able to smoothly change walking speed without loss of balance or gait deviation. Deviate no more than 6 in outside of the 12 in walkway width.    Gait with Horizontal Head Turns  Performs head turns smoothly with slight change in gait velocity (eg, minor disruption to smooth gait path), deviates 6-10 in outside 12 in walkway width, or uses an assistive device.    Gait with Vertical Head Turns  Performs task with slight change in gait velocity (eg, minor disruption to smooth gait path), deviates 6 - 10 in outside 12 in walkway width or uses assistive device    Gait and Pivot Turn  Pivot turns safely within 3 sec and stops quickly with no loss of balance.    Step Over Obstacle  Is able to step over 2 stacked shoe boxes taped together (9 in total height) without changing gait speed. No evidence of imbalance.    Gait with Narrow Base of Support  Ambulates less than 4 steps heel to toe or cannot perform without assistance.    Gait with Eyes Closed  Cannot walk 20 ft without assistance, severe gait deviations or imbalance, deviates greater than 15 in outside 12 in walkway width or will not attempt task.    Ambulating Backwards  Cannot walk 20 ft without assistance, severe gait deviations  or imbalance, deviates greater than 15 in outside 12 in walkway width or will not attempt task.    Steps  Alternating feet, no rail.    Total Score  19                Objective measurements completed on examination: See above findings.    TREATMENT  Neuromuscular Re-education  Static tandem balance alternating forward LE 30s x 3 on each side; Semitandem balance with horizontal head turns alternating forward LE 30s x 2 on each side; Pt issued written HEP and reviewed instructions with patient;          PT Education - 02/07/18 1014     Education Details  Plan of care, goals, HEP    Person(s) Educated  Patient    Methods  Explanation;Handout;Demonstration    Comprehension  Verbalized understanding       PT Short Term Goals - 02/07/18 0920      PT SHORT TERM GOAL #1   Title  Pt will be independent with HEP in order to improve strength and balance in order to decrease fall risk and improve function at home and work.    Time  4    Period  Weeks    Status  New    Target Date  03/07/18        PT Long Term Goals - 02/07/18 0921      PT LONG TERM GOAL #1   Title  Pt will improve BERG by at least 3 points in order to demonstrate clinically significant improvement in balance.      Baseline  02/07/18: 52/56    Time  8    Period  Weeks    Status  New    Target Date  04/04/18      PT LONG TERM GOAL #2   Title  Pt will improve FGA by at least 4 points in order to demonstrate clinically significant improvement in balance and decreased risk for falls.    Baseline  02/07/18: 19/30    Time  8    Period  Weeks    Status  New    Target Date  04/04/18      PT LONG TERM GOAL #3   Title  Pt will improve ABC by at least 13% in order to demonstrate clinically significant improvement in balance confidence.     Baseline  02/07/18: 65.63%    Time  8    Period  Weeks    Status  New    Target Date  04/04/18             Plan - 02/07/18 1015    Clinical Impression Statement   Pt is a pleasant 77 yo female referred for balance difficulty. She states that it started approximately one year ago without any known trigger and has been progressive since that time. She has the most difficulty with head turns while walking. She denies dizziness or vertigo. She believes that her symptoms are related to her anxiety. She states that her PCP started her on Zoloft approximately 6 months ago. She reports that she took the medication for one month and then stopped. She recently restarted her Zoloft one week ago. PT examination today reveals  balance deficits as identified by 52/56 on the BERG and 19/30 on the FGA. Low balance confidence with pt scoring 65.63% on ABC. Her gait speed is within normal limits for full community ambulation. TUG and Five Time Sit to Stand Tests are  WNL. Bedside oculomotor screening reveals no obvious concern for vestibular involvement. Pt will benefit from PT services to address deficits in strength, balance, and mobility in order to return to full function at home.    History and Personal Factors relevant to plan of care:  2 personal factors/comorbidities, 3 body systems/activity limitations/participation restrictions      Clinical Presentation  Unstable    Clinical Presentation due to:  Fluctuating symptoms    Clinical Decision Making  Moderate    Rehab Potential  Good    PT Frequency  2x / week    PT Duration  8 weeks    PT Treatment/Interventions  ADLs/Self Care Home Management;Aquatic Therapy;Biofeedback;Canalith Repostioning;Cryotherapy;Iontophoresis 4mg /ml Dexamethasone;Electrical Stimulation;Moist Heat;Traction;Ultrasound;DME Instruction;Gait training;Stair training;Functional mobility training;Therapeutic activities;Therapeutic exercise;Balance training;Neuromuscular re-education;Cognitive remediation;Patient/family education;Manual techniques;Dry needling;Energy conservation;Vestibular    PT Next Visit Plan  MOCA?, mCTSIB, progress balance and strengthening program, consider introduction of mindfulness or other stress reduction techniques    PT Home Exercise Plan  Semitandem balance with horiz head turns, full tandem static balance    Consulted and Agree with Plan of Care  Patient       Patient will benefit from skilled therapeutic intervention in order to improve the following deficits and impairments:  Decreased balance, Difficulty walking  Visit Diagnosis: Unsteadiness on feet  History of falling     Problem List Patient Active Problem List   Diagnosis Date Noted  . GAD (generalized  anxiety disorder) 10/30/2017  . MDD (major depressive disorder), single episode, mild ([place]) 10/30/2017  . Chronic insomnia 10/30/2017  . TMJ tenderness, right 05/04/2017  . Cough 05/04/2017  . Post-nasal drip 05/04/2017  . Ear ringing sound, right 05/04/2017  . Dysthymia 01/30/2017  . 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 Epidermoid cyst of finger of left hand 07/27/2016  . Osteopenia 11/18/2015  . Chronic constipation 07/20/2015  . Internal hemorrhoid, bleeding 07/20/2015  . 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 Counseling regarding end of life decision making 09/08/2014  . Childhood asthma 08/13/2014  . Gastroesophageal reflux disease without esophagitis 08/13/2014  . Irritable bowel syndrome with constipation 08/13/2014  . Hx of migraines 08/13/2014  . Family history of colon cancer 08/13/2014  . Family history of uterine cancer 08/13/2014  . Family history of breast cancer 08/13/2014   Phillips Grout PT, DPT, GCS  Huprich,Jason 02/07/2018, 4:46 PM  [place] MAIN [facility] SERVICES [address] [place], [place], [address] Phone: [phone]   Fax:  [phone]  Name: Jessica Hardin MRN: [id number] Date of Birth: 02/23/1941

## 2018-02-07 NOTE — Telephone Encounter (Signed)
Jessica Hardin notified as instructed by telephone.  

## 2018-02-11 ENCOUNTER — Encounter: Payer: Self-pay | Admitting: Physical Therapy

## 2018-02-11 ENCOUNTER — Ambulatory Visit: Payer: PPO | Admitting: Physical Therapy

## 2018-02-11 DIAGNOSIS — Z9181 History of falling: Secondary | ICD-10-CM

## 2018-02-11 DIAGNOSIS — R2681 Unsteadiness on feet: Secondary | ICD-10-CM

## 2018-02-11 NOTE — Therapy (Signed)
[place] MAIN [facility] SERVICES [address] [place], [place], [address] Phone: [phone]   Fax:  [phone]  Physical Therapy Treatment  Patient Details  Name: Jessica Hardin MRN: [id number] Date of Birth: 10-Oct-1940 Referring Provider: Dr. Diona Browner   Encounter Date: 02/11/2018  PT End of Session - 02/11/18 1106    Visit Number  1    Number of Visits  17    Date for PT Re-Evaluation  04/04/18    Authorization Type  Progress note: 1/10    PT Start Time  1100    PT Stop Time  1145    PT Time Calculation (min)  45 min    Equipment Utilized During Treatment  Gait belt    Activity Tolerance  Patient tolerated treatment well    Behavior During Therapy  [facility] for tasks assessed/performed       Past Medical History:  Diagnosis Date  . Allergy   . Asthma   . GERD (gastroesophageal reflux disease)   . History of chicken pox   . IBS (irritable bowel syndrome)   . Migraine   . Urinary tract infection     Past Surgical History:  Procedure Laterality Date  . ABDOMINAL HYSTERECTOMY    . CATARACT EXTRACTION, BILATERAL    . TUBAL LIGATION      There were no vitals filed for this visit.  Subjective Assessment - 02/11/18 1104    Subjective  Patient is having 6/10 pain to right thigh today and it started yesterday.    Pertinent History  Pt reports that she is having trouble with her balance. She states that it started approximately one year ago without any known trigger and has been progressive since that time. She has the most difficulty with head turns while walking. She denies dizziness or vertigo. She believes that her symptoms are related to her anxiety. She reports recent visit to the ER secondary to anxiety and GI upset. She had an abdominal CT which was negative for acute findings. She reports recent problems with her memory as well. She saw [facility] who reports that her formal neurocognitive testing did not show dementia but  implicated stress, depression, and anxiety. Per neurology note brain MRI in 2019 was normal for her age. She states that her PCP started her on Zoloft approximately 6 months ago. She reports that she took one month and then never refilled the medication. She recently restarted her Zoloft one week ago. She has not disclosed that she stopped her Zoloft to her PCP. She does have a follow-up appt scheduled 02/19/18. ROS negative for red flags (bowel/bladder changes, saddle paresthesia, personal history of cancer, chills/fever, night sweats, unrelenting pain, weight gain/loss). Pt does report decreased appetite recently due to anxiety. She also reports poor sleep stating she only sleeps approximately 4 hours/night.     Limitations  Walking    Diagnostic tests  Abd CT: no acute changes, Brain MRI: WNL for age    Patient Stated Goals  Improve her balance    Currently in Pain?  Yes    Pain Score  6     Pain Location  Leg    Pain Orientation  Right    Pain Descriptors / Indicators  Burning    Pain Type  Acute pain    Pain Onset  Yesterday    Pain Frequency  Intermittent    Aggravating Factors   walking and standing    Pain Relieving Factors  sitting  Effect of Pain on Daily Activities  none    Multiple Pain Sites  No        NEUROMUSCULAR RE-EDUCATION  Lunging onto BOSU ball with max VC for correct technique and 1 UE assist  Standing on 1/2 foam flat side up with head turns x 2 mins , 25% need of UE support  Diagonal  stepping to stepping stone  from foam  surface x 10 x 3 sets , CGA  Diagonal  stepping to stepping stone on 6 inch stool  from foam  surface x 10 x 3 sets , CGA  Static standing one foot disk, one foot ball x 10 , CGA  Stepping over 1/2 foam  fwd/bwd, and side to side x 15 each direction,  Tandem standing on foam and head turns with 50% UE support  Tandem stepping  on blue foam balance beam x 5 lengths of the parallel bars, 25 % UE support  Fast walking and 360 turn,  180 turn and stepping over 6 inch stool fast x 10 each , min assist with LOB 50%  Leg press 90 lbs x 20 x 2     Patient needs VC and demonstration for correct performance of exercises                        PT Education - 02/11/18 1106    Education Details  HEP    Person(s) Educated  Patient    Methods  Explanation;Tactile cues;Verbal cues    Comprehension  Verbalized understanding;Returned demonstration       PT Short Term Goals - 02/07/18 0920      PT SHORT TERM GOAL #1   Title  Pt will be independent with HEP in order to improve strength and balance in order to decrease fall risk and improve function at home and work.    Time  4    Period  Weeks    Status  New    Target Date  03/07/18        PT Long Term Goals - 02/07/18 0921      PT LONG TERM GOAL #1   Title  Pt will improve BERG by at least 3 points in order to demonstrate clinically significant improvement in balance.      Baseline  02/07/18: 52/56    Time  8    Period  Weeks    Status  New    Target Date  04/04/18      PT LONG TERM GOAL #2   Title  Pt will improve FGA by at least 4 points in order to demonstrate clinically significant improvement in balance and decreased risk for falls.    Baseline  02/07/18: 19/30    Time  8    Period  Weeks    Status  New    Target Date  04/04/18      PT LONG TERM GOAL #3   Title  Pt will improve ABC by at least 13% in order to demonstrate clinically significant improvement in balance confidence.     Baseline  02/07/18: 65.63%    Time  8    Period  Weeks    Status  New    Target Date  04/04/18            Plan - 02/11/18 1110    Clinical Impression Statement  Pt was able to progress dynamic balance exercises today, noting improved postural reactions with LOB and moving outside  normal BOS. Pt was able to perform all exercises on uneven surfaces with minimal LOB noted. Single limb stability activities were added today, with decrease control  noted when attempting to perform tasks with single leg. Pt would continue to benefit from skilled therapy services to address further balance impairments and decrease falls risk    Rehab Potential  Good    PT Frequency  2x / week    PT Duration  8 weeks    PT Treatment/Interventions  ADLs/Self Care Home Management;Aquatic Therapy;Biofeedback;Canalith Repostioning;Cryotherapy;Iontophoresis 4mg /ml Dexamethasone;Electrical Stimulation;Moist Heat;Traction;Ultrasound;DME Instruction;Gait training;Stair training;Functional mobility training;Therapeutic activities;Therapeutic exercise;Balance training;Neuromuscular re-education;Cognitive remediation;Patient/family education;Manual techniques;Dry needling;Energy conservation;Vestibular    PT Next Visit Plan  MOCA?, mCTSIB, progress balance and strengthening program, consider introduction of mindfulness or other stress reduction techniques    PT Home Exercise Plan  Semitandem balance with horiz head turns, full tandem static balance    Consulted and Agree with Plan of Care  Patient       Patient will benefit from skilled therapeutic intervention in order to improve the following deficits and impairments:  Decreased balance, Difficulty walking  Visit Diagnosis: Unsteadiness on feet  History of falling     Problem List Patient Active Problem List   Diagnosis Date Noted  . GAD (generalized anxiety disorder) 10/30/2017  . MDD (major depressive disorder), single episode, mild ([place]) 10/30/2017  . Chronic insomnia 10/30/2017  . TMJ tenderness, right 05/04/2017  . Cough 05/04/2017  . Post-nasal drip 05/04/2017  . Ear ringing sound, right 05/04/2017  . Dysthymia 01/30/2017  . 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 Epidermoid cyst of finger of left hand 07/27/2016  . Osteopenia 11/18/2015  . Chronic constipation 07/20/2015  . Internal hemorrhoid, bleeding 07/20/2015  . 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 Counseling regarding end of life decision  making 09/08/2014  . Childhood asthma 08/13/2014  . Gastroesophageal reflux disease without esophagitis 08/13/2014  . Irritable bowel syndrome with constipation 08/13/2014  . Hx of migraines 08/13/2014  . Family history of colon cancer 08/13/2014  . Family history of uterine cancer 08/13/2014  . Family history of breast cancer 08/13/2014    Alanson Puls, PT DPT 02/11/2018, 11:13 AM  [place] MAIN [facility] SERVICES [place], [place], [address] Phone: [phone]   Fax:  [phone]  Name: ALICJA EVERITT MRN: [id number] Date of Birth: [birth year]

## 2018-02-13 ENCOUNTER — Ambulatory Visit: Payer: PPO | Admitting: Physical Therapy

## 2018-02-13 ENCOUNTER — Encounter: Payer: Self-pay | Admitting: Physical Therapy

## 2018-02-13 DIAGNOSIS — Z9181 History of falling: Secondary | ICD-10-CM

## 2018-02-13 DIAGNOSIS — R2681 Unsteadiness on feet: Secondary | ICD-10-CM | POA: Diagnosis not present

## 2018-02-13 NOTE — Therapy (Signed)
[place] MAIN [facility] [address] [place], [place], [address] Phone: [phone]   Fax:  [phone]  Physical Therapy Treatment  Patient Details  Name: Jessica Hardin MRN: [id number] Date of Birth: 1940-09-13 Referring Provider: Dr. Diona Browner   Encounter Date: 02/13/2018  PT End of Session - 02/13/18 0856    Visit Number  3    Number of Visits  17    Date for PT Re-Evaluation  04/04/18    Authorization Type  Progress note: 3/10    PT Start Time  0845    PT Stop Time  0930    PT Time Calculation (min)  45 min    Equipment Utilized During Treatment  Gait belt    Activity Tolerance  Patient tolerated treatment well    Behavior During Therapy  [facility] for tasks assessed/performed       Past Medical History:  Diagnosis Date  . Allergy   . Asthma   . GERD (gastroesophageal reflux disease)   . History of chicken pox   . IBS (irritable bowel syndrome)   . Migraine   . Urinary tract infection     Past Surgical History:  Procedure Laterality Date  . ABDOMINAL HYSTERECTOMY    . CATARACT EXTRACTION, BILATERAL    . TUBAL LIGATION      There were no vitals filed for this visit.  Subjective Assessment - 02/13/18 0855    Subjective  Patient is having 6/10 pain to right thigh today and it started yesterday.    Pertinent History  Pt reports that she is having trouble with her balance. She states that it started approximately one year ago without any known trigger and has been progressive since that time. She has the most difficulty with head turns while walking. She denies dizziness or vertigo. She believes that her symptoms are related to her anxiety. She reports recent visit to the ER secondary to anxiety and GI upset. She had an abdominal CT which was negative for acute findings. She reports recent problems with her memory as well. She saw [facility] who reports that her formal neurocognitive testing did not show dementia but  implicated stress, depression, and anxiety. Per neurology note brain MRI in 2019 was normal for her age. She states that her PCP started her on Zoloft approximately 6 months ago. She reports that she took one month and then never refilled the medication. She recently restarted her Zoloft one week ago. She has not disclosed that she stopped her Zoloft to her PCP. She does have a follow-up appt scheduled 02/19/18. ROS negative for red flags (bowel/bladder changes, saddle paresthesia, personal history of cancer, chills/fever, night sweats, unrelenting pain, weight gain/loss). Pt does report decreased appetite recently due to anxiety. She also reports poor sleep stating she only sleeps approximately 4 hours/night.     Limitations  Walking    Diagnostic tests  Abd CT: no acute changes, Brain MRI: WNL for age    Patient Stated Goals  Improve her balance    Currently in Pain?  Yes    Pain Score  4     Pain Location  Knee    Pain Orientation  Right    Pain Descriptors / Indicators  Aching    Pain Type  Acute pain    Pain Onset  Yesterday    Aggravating Factors   walking    Pain Relieving Factors  NA    Effect of Pain on Daily Activities  none  Multiple Pain Sites  No           NEUROMUSCULAR RE-EDUCATION  Lunging onto BOSU ball from foam  VC for correct technique and 1 UE assist, has poor quality of motion  Standing on 1/2 foam flat side up with trunk rotation x 2 mins , 25% need of UE support  Diagonal  stepping tostepping stone from foamsurface x 10 x 3 sets, CGA  Diagonal  stepping tostepping stone on 6 inch stool from foamsurface x 10 x 3 sets, CGA  Static standing one foot disk, one foot ball x 10 , CGA  Rocker board  and trunk rotation with yellow thera ball , fwd/bwd, and lateral shifts  with 50% UE support x 2 mins  Tandem standing on 1/2 foam with flat side up and cone stacking on stool with need of min assist x 20 cones x 2 sets   TM side stepping with elevation 2  and . 4 miles / hour, 25 % UE support  Fast walking and 360 turn, 180 turn and stepping over 6 inch stool fast x 10 each , min assist with LOB 50%  Leg press 90 lbs x 20 x 2      Airex feet apart and tapping yellow disk x 20 cues for posture correction   4 square fwd/bwd, side to side stepping/ diagonal stepping, cues to not step on the lines   Cues for proper technique of exercises, slow eccentric contractions to target specific muscles and facility increased muscle building.  Patient needs VC and demonstration for correct performance of exercises                      PT Education - 02/13/18 0855    Education Details  HEP    Person(s) Educated  Patient    Methods  Explanation;Verbal cues    Comprehension  Verbalized understanding;Returned demonstration       PT Short Term Goals - 02/07/18 0920      PT SHORT TERM GOAL #1   Title  Pt will be independent with HEP in order to improve strength and balance in order to decrease fall risk and improve function at home and work.    Time  4    Period  Weeks    Status  New    Target Date  03/07/18        PT Long Term Goals - 02/07/18 0921      PT LONG TERM GOAL #1   Title  Pt will improve BERG by at least 3 points in order to demonstrate clinically significant improvement in balance.      Baseline  02/07/18: 52/56    Time  8    Period  Weeks    Status  New    Target Date  04/04/18      PT LONG TERM GOAL #2   Title  Pt will improve FGA by at least 4 points in order to demonstrate clinically significant improvement in balance and decreased risk for falls.    Baseline  02/07/18: 19/30    Time  8    Period  Weeks    Status  New    Target Date  04/04/18      PT LONG TERM GOAL #3   Title  Pt will improve ABC by at least 13% in order to demonstrate clinically significant improvement in balance confidence.     Baseline  02/07/18: 65.63%    Time  8  Period  Weeks    Status  New    Target Date  04/04/18             Plan - 02/13/18 0857    Clinical Impression Statement  Instructed patient in balance and strengthening exercise. Patient requires CGA to min A with advanced balance exercise. Patient requires cues for weight shift and trunk control for better balance. Patient also instructed to slow down LE movement during strengthening exercise for better motor control. Patient reports increased fatigue at end of treatment session. Patient would benefit from additional skilled PT intervention to improve balance/gait safety and reduce fall risk.    Rehab Potential  Good    PT Frequency  2x / week    PT Duration  8 weeks    PT Treatment/Interventions  ADLs/Self Care Home Management;Aquatic Therapy;Biofeedback;Canalith Repostioning;Cryotherapy;Iontophoresis 4mg /ml Dexamethasone;Electrical Stimulation;Moist Heat;Traction;Ultrasound;DME Instruction;Gait training;Stair training;Functional mobility training;Therapeutic activities;Therapeutic exercise;Balance training;Neuromuscular re-education;Cognitive remediation;Patient/family education;Manual techniques;Dry needling;Energy conservation;Vestibular    PT Next Visit Plan  MOCA?, mCTSIB, progress balance and strengthening program, consider introduction of mindfulness or other stress reduction techniques    PT Home Exercise Plan  Semitandem balance with horiz head turns, full tandem static balance    Consulted and Agree with Plan of Care  Patient       Patient will benefit from skilled therapeutic intervention in order to improve the following deficits and impairments:  Decreased balance, Difficulty walking  Visit Diagnosis: Unsteadiness on feet  History of falling     Problem List Patient Active Problem List   Diagnosis Date Noted  . GAD (generalized anxiety disorder) 10/30/2017  . MDD (major depressive disorder), single episode, mild ([place]) 10/30/2017  . Chronic insomnia 10/30/2017  . TMJ tenderness, right 05/04/2017  . Cough 05/04/2017  .  Post-nasal drip 05/04/2017  . Ear ringing sound, right 05/04/2017  . Dysthymia 01/30/2017  . 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 Epidermoid cyst of finger of left hand 07/27/2016  . Osteopenia 11/18/2015  . Chronic constipation 07/20/2015  . Internal hemorrhoid, bleeding 07/20/2015  . 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 Counseling regarding end of life decision making 09/08/2014  . Childhood asthma 08/13/2014  . Gastroesophageal reflux disease without esophagitis 08/13/2014  . Irritable bowel syndrome with constipation 08/13/2014  . Hx of migraines 08/13/2014  . Family history of colon cancer 08/13/2014  . Family history of uterine cancer 08/13/2014  . Family history of breast cancer 08/13/2014    Alanson Puls, PT DPT 02/13/2018, 8:59 AM  [place] MAIN [facility] SERVICES [place], [place], [address] Phone: [phone]   Fax:  [phone]  Name: VICKY SCHLEICH MRN: [id number] Date of Birth: January 20, 1941

## 2018-02-19 ENCOUNTER — Ambulatory Visit (INDEPENDENT_AMBULATORY_CARE_PROVIDER_SITE_OTHER): Payer: PPO | Admitting: Family Medicine

## 2018-02-19 ENCOUNTER — Encounter: Payer: Self-pay | Admitting: Family Medicine

## 2018-02-19 DIAGNOSIS — F320 Major depressive disorder, single episode, mild: Secondary | ICD-10-CM | POA: Diagnosis not present

## 2018-02-19 DIAGNOSIS — R1084 Generalized abdominal pain: Secondary | ICD-10-CM | POA: Diagnosis not present

## 2018-02-19 MED ORDER — SERTRALINE HCL 50 MG PO TABS: 50.0000 mg | ORAL_TABLET | Freq: Every day | ORAL | 5 refills | Status: DC

## 2018-02-19 NOTE — Patient Instructions (Signed)
Change to take sertraline at bedtime instead of in the morning.  Increase the dose to 50 mg at bedtime ( 2 tabs of 25 mg until gone or the new tablet of 50 mg).

## 2018-02-19 NOTE — Assessment & Plan Note (Signed)
Most likely due to mood. Pt with weakly positive UA, no culture done and no UTI symptoms. Now resolved.

## 2018-02-19 NOTE — Assessment & Plan Note (Signed)
Appears to be causing memory loss per neurocognitive eval.  Anxiety may also be causing abd pain. Will increase her dose of sertraline to 50 mg .. Change to bedtime dosing to hep with sleep.

## 2018-02-19 NOTE — Progress Notes (Signed)
   Subjective:    Patient ID: Jessica Hardin, female    DOB: 16-Aug-1940, 77 y.o.   MRN: [id number]  HPI   77 year old female presents for follow up after a  [facility] ER visit on 8/19 and again on 8/21 for abd pain.  Initial eval showed nml lipase,nml CMET, nml cbc,slightly positive UA  No acute findings on CT  She was treated with cephalexin for possible UTI. She returned to ER on 8/21 but left before being seen. She went back to ask about discoloration in arms from blood draws.   Today she reports she is feeling better. No abd pain. No dysuria.  She continues to have memory issues.. Seeing Dr. Jaynee Eagles neurologist. Neurocognitive testing did not show dementia but likely anxiety and depression. MRI of the brain 2019 normal for age  No SE to sertraline.  She is going to PT for balance issue.. Just started last week.  Blood pressure 120/74, pulse 72, temperature 98.7 F (37.1 C), temperature source Oral, height 4' 10.5" (1.486 m), weight 127 lb 8 oz (57.8 kg). Social History /Family History/Past Medical History reviewed in detail and updated in EMR if needed.  Review of Systems  Constitutional: Negative for fatigue and fever.  HENT: Negative for congestion and ear pain.   Eyes: Negative for pain.  Respiratory: Negative for cough, chest tightness and shortness of breath.   Cardiovascular: Negative for chest pain, palpitations and leg swelling.  Gastrointestinal: Negative for abdominal pain.  Genitourinary: Negative for dysuria and vaginal bleeding.  Musculoskeletal: Negative for back pain.  Neurological: Negative for syncope, light-headedness and headaches.  Psychiatric/Behavioral: Negative for dysphoric mood.       Objective:   Physical Exam  Constitutional: Vital signs are normal. She appears well-developed and well-nourished. She is cooperative.  Non-toxic appearance. She does not appear ill. No distress.  HENT:  Head: Normocephalic.  Right Ear: Hearing, tympanic membrane,  external ear and ear canal normal. Tympanic membrane is not erythematous, not retracted and not bulging.  Left Ear: Hearing, tympanic membrane, external ear and ear canal normal. Tympanic membrane is not erythematous, not retracted and not bulging.  Nose: No mucosal edema or rhinorrhea. Right sinus exhibits no maxillary sinus tenderness and no frontal sinus tenderness. Left sinus exhibits no maxillary sinus tenderness and no frontal sinus tenderness.  Mouth/Throat: Uvula is midline, oropharynx is clear and moist and mucous membranes are normal.  Eyes: Pupils are equal, round, and reactive to light. Conjunctivae, EOM and lids are normal. Lids are everted and swept, no foreign bodies found.  Neck: Trachea normal and normal range of motion. Neck supple. Carotid bruit is not present. No thyroid mass and no thyromegaly present.  Cardiovascular: Normal rate, regular rhythm, S1 normal, S2 normal, normal heart sounds, intact distal pulses and normal pulses. Exam reveals no gallop and no friction rub.  No murmur heard. Pulmonary/Chest: Effort normal and breath sounds normal. No tachypnea. No respiratory distress. She has no decreased breath sounds. She has no wheezes. She has no rhonchi. She has no rales.  Abdominal: Soft. Normal appearance and bowel sounds are normal. There is no tenderness.  Neurological: She is alert.  Skin: Skin is warm, dry and intact. No rash noted.  Psychiatric: Her speech is normal and behavior is normal. Judgment and thought content normal. Her mood appears not anxious. Cognition and memory are normal. She does not exhibit a depressed mood.          Assessment & Plan:

## 2018-02-21 ENCOUNTER — Encounter: Payer: Self-pay | Admitting: Physical Therapy

## 2018-02-21 ENCOUNTER — Ambulatory Visit: Payer: PPO | Attending: Family Medicine | Admitting: Physical Therapy

## 2018-02-21 DIAGNOSIS — Z9181 History of falling: Secondary | ICD-10-CM | POA: Insufficient documentation

## 2018-02-21 DIAGNOSIS — R2681 Unsteadiness on feet: Secondary | ICD-10-CM | POA: Insufficient documentation

## 2018-02-21 NOTE — Patient Instructions (Signed)
  Copyright  VHI. All rights reserved.  Backward Walking   Walk backward, toes of each foot coming down first. Take long, even strides. Make sure you have a clear pathway with no obstructions when you do this. Stand beside counter and walk backward  And then walk forward doing opposite directions; repeat 10 laps 2x a day at least 5 days a week.   Repeat 5 reps with each foot in front 5 days a week.Balance: Unilateral   Attempt to balance on left leg, eyes open. Hold _5-10___ seconds.Start with holding onto counter and if you get your balance you can try to let go of counter. Repeat __5__ times per set. Do __1__ sets per session. Do __1__ sessions per day. Keep eyes open:   http://orth.exer.us/29   Copyright  VHI. All rights reserved.

## 2018-02-21 NOTE — Therapy (Signed)
[place] MAIN [facility] SERVICES [address] [place], [place], [address] Phone: [phone]   Fax:  [phone]  Physical Therapy Treatment  Patient Details  Name: Jessica Hardin MRN: [id number] Date of Birth: December 12, 1940 Referring Provider: Dr. Diona Browner   Encounter Date: 02/21/2018  PT End of Session - 02/21/18 0903    Visit Number  4    Number of Visits  17    Date for PT Re-Evaluation  04/04/18    Authorization Type  Progress note: 4/10    PT Start Time  0858    PT Stop Time  0930    PT Time Calculation (min)  32 min    Equipment Utilized During Treatment  Gait belt    Activity Tolerance  Patient tolerated treatment well    Behavior During Therapy  [facility] for tasks assessed/performed       Past Medical History:  Diagnosis Date  . Allergy   . Asthma   . GERD (gastroesophageal reflux disease)   . History of chicken pox   . IBS (irritable bowel syndrome)   . Migraine   . Urinary tract infection     Past Surgical History:  Procedure Laterality Date  . ABDOMINAL HYSTERECTOMY    . CATARACT EXTRACTION, BILATERAL    . TUBAL LIGATION      There were no vitals filed for this visit.  Subjective Assessment - 02/21/18 0902    Subjective  Patient reports doing well; reports no pain currently; Patient denies any new falls; Reports adherence to HEP. Patient states, "I think that my balance is getting a little better."     Pertinent History  Pt reports that she is having trouble with her balance. She states that it started approximately one year ago without any known trigger and has been progressive since that time. She has the most difficulty with head turns while walking. She denies dizziness or vertigo. She believes that her symptoms are related to her anxiety. She reports recent visit to the ER secondary to anxiety and GI upset. She had an abdominal CT which was negative for acute findings. She reports recent problems with her memory as well.  She saw [facility] who reports that her formal neurocognitive testing did not show dementia but implicated stress, depression, and anxiety. Per neurology note brain MRI in 2019 was normal for her age. She states that her PCP started her on Zoloft approximately 6 months ago. She reports that she took one month and then never refilled the medication. She recently restarted her Zoloft one week ago. She has not disclosed that she stopped her Zoloft to her PCP. She does have a follow-up appt scheduled 02/19/18. ROS negative for red flags (bowel/bladder changes, saddle paresthesia, personal history of cancer, chills/fever, night sweats, unrelenting pain, weight gain/loss). Pt does report decreased appetite recently due to anxiety. She also reports poor sleep stating she only sleeps approximately 4 hours/night.     Limitations  Walking    Diagnostic tests  Abd CT: no acute changes, Brain MRI: WNL for age    Patient Stated Goals  Improve her balance    Currently in Pain?  No/denies    Pain Onset  Yesterday    Multiple Pain Sites  No        TREATMENT: Warm up on Nustep BUE/BLE level 2 x3 min (Unbilled);   Diagonal stepping over airex beam forward/backward x2 laps; Tandem stance airex beam with BUE ball pass side/side x2 laps-CGA for safety  with cues to improve upper trunk control for less loss of balance with ball pass;  Side stepping on airex beam with BUE ball up/down x2 laps each direction;  Weaving around cones x2 laps Side stepping over cones x2 laps each direction; Forward/backward side step negotiation around cones x1 lap each direction; Required CGA to close supervision for safety and min VCs for sequencing; also required cues to increase hip flexion when stepping over cones for better foot clearance;   Figure 8 walking forward/backward around cones x5 laps each with mod VCS for sequencing and step progression. Patient had difficulty changing from forward to backward walking;   Tandem  gait on level surface x10 feet x2 laps unsupported Forward walking level surface eyes closed x20 feet x3 laps Backward walking eyes open level surface x20 feet x3 laps;    Step up with contralateral LE lift for SLS 3 sec hold x5 reps each LE, unsupported; CGA for safety with cues to improve trunk control for better static balance;  Side step up and over 4 inch step with airex pad, while holding small kickball, x2 laps each direction; progressing to ball up/down concurrent with step ups to challenge dual tasks x5 laps each direction; Required min A for safety with 1 episode of loss of balance requiring mod A to avoid falling;   Advanced HEP with SLS and backward walking; provided written HEP for better adherence;                        PT Education - 02/21/18 0902    Education Details  balance exercise, HEP reinforced;     Person(s) Educated  Patient    Methods  Explanation;Verbal cues    Comprehension  Verbalized understanding;Returned demonstration;Verbal cues required;Need further instruction       PT Short Term Goals - 02/07/18 0920      PT SHORT TERM GOAL #1   Title  Pt will be independent with HEP in order to improve strength and balance in order to decrease fall risk and improve function at home and work.    Time  4    Period  Weeks    Status  New    Target Date  03/07/18        PT Long Term Goals - 02/07/18 0921      PT LONG TERM GOAL #1   Title  Pt will improve BERG by at least 3 points in order to demonstrate clinically significant improvement in balance.      Baseline  02/07/18: 52/56    Time  8    Period  Weeks    Status  New    Target Date  04/04/18      PT LONG TERM GOAL #2   Title  Pt will improve FGA by at least 4 points in order to demonstrate clinically significant improvement in balance and decreased risk for falls.    Baseline  02/07/18: 19/30    Time  8    Period  Weeks    Status  New    Target Date  04/04/18      PT LONG TERM  GOAL #3   Title  Pt will improve ABC by at least 13% in order to demonstrate clinically significant improvement in balance confidence.     Baseline  02/07/18: 65.63%    Time  8    Period  Weeks    Status  New    Target Date  04/04/18  Plan - 02/21/18 1145    Clinical Impression Statement  Patient instructed in advanced balance activities. She had increased difficulty with gait directional change and when doing advanced balance activites on compliant surfaces. Patient required close supervision to [facility] for safety; She required cues for weight shift and increase step length for better foot clearance. Patient denies any pain or fatigue at end of session. Advanced HEP with balance activities. She would benefit from additional skilled PT intervention to improve balance and gait safety;     Rehab Potential  Good    PT Frequency  2x / week    PT Duration  8 weeks    PT Treatment/Interventions  ADLs/Self Care Home Management;Aquatic Therapy;Biofeedback;Canalith Repostioning;Cryotherapy;Iontophoresis 4mg /ml Dexamethasone;Electrical Stimulation;Moist Heat;Traction;Ultrasound;DME Instruction;Gait training;Stair training;Functional mobility training;Therapeutic activities;Therapeutic exercise;Balance training;Neuromuscular re-education;Cognitive remediation;Patient/family education;Manual techniques;Dry needling;Energy conservation;Vestibular    PT Next Visit Plan  MOCA?, mCTSIB, progress balance and strengthening program, consider introduction of mindfulness or other stress reduction techniques    PT Home Exercise Plan  Semitandem balance with horiz head turns, full tandem static balance    Consulted and Agree with Plan of Care  Patient       Patient will benefit from skilled therapeutic intervention in order to improve the following deficits and impairments:  Decreased balance, Difficulty walking  Visit Diagnosis: Unsteadiness on feet  History of falling     Problem List Patient  Active Problem List   Diagnosis Date Noted  . Generalized abdominal pain 02/19/2018  . GAD (generalized anxiety disorder) 10/30/2017  . MDD (major depressive disorder), single episode, mild ([place]) 10/30/2017  . Chronic insomnia 10/30/2017  . TMJ tenderness, right 05/04/2017  . Post-nasal drip 05/04/2017  . Ear ringing sound, right 05/04/2017  . 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 Epidermoid cyst of finger of left hand 07/27/2016  . Osteopenia 11/18/2015  . Chronic constipation 07/20/2015  . Internal hemorrhoid, bleeding 07/20/2015  . 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 Counseling regarding end of life decision making 09/08/2014  . Childhood asthma 08/13/2014  . Gastroesophageal reflux disease without esophagitis 08/13/2014  . Irritable bowel syndrome with constipation 08/13/2014  . Hx of migraines 08/13/2014  . Family history of colon cancer 08/13/2014  . Family history of uterine cancer 08/13/2014  . Family history of breast cancer 08/13/2014    Neils Siracusa PT, DPT 02/21/2018, 11:48 AM  [place] MAIN [facility] SERVICES [address] [place], [place], [address] Phone: [phone]   Fax:  [phone]  Name: Jessica Hardin MRN: [id number] Date of Birth: 1941/01/05

## 2018-02-25 ENCOUNTER — Telehealth: Payer: Self-pay

## 2018-02-25 NOTE — Telephone Encounter (Signed)
Copied from [place] [phone]. Topic: Quick Communication - Rx Refill/Question >> Feb 25, 2018  1:51 PM Scherrie Gerlach wrote: Medication: sertraline (ZOLOFT) 50 MG tablet  Pt states after the increase to 50 mg, it has made her stomach sore.  Pt is not going to take anymore.

## 2018-02-26 ENCOUNTER — Encounter: Payer: Self-pay | Admitting: Physical Therapy

## 2018-02-26 ENCOUNTER — Ambulatory Visit: Payer: PPO | Admitting: Physical Therapy

## 2018-02-26 DIAGNOSIS — R2681 Unsteadiness on feet: Secondary | ICD-10-CM

## 2018-02-26 DIAGNOSIS — Z9181 History of falling: Secondary | ICD-10-CM

## 2018-02-26 NOTE — Telephone Encounter (Signed)
Mrs. Horan notified as instructed by telephone.  She has not been taking it with food and just stopped taking it yesterday.    She will give it another try and take it with dinner.  She will keep [place] posted on how she does.

## 2018-02-26 NOTE — Therapy (Addendum)
[place] MAIN [facility] SERVICES [address] [place], [place], [address] Phone: [phone]   Fax:  [phone]  Physical Therapy Treatment  Patient Details  Name: Jessica Hardin MRN: [id number] Date of Birth: 1941/02/27 Referring Provider: Dr. Diona Browner   Encounter Date: 02/26/2018  PT End of Session - 02/26/18 0849    Visit Number  5    Number of Visits  17    Date for PT Re-Evaluation  04/04/18    Authorization Type  Progress note: 5/10    PT Start Time  0850    PT Stop Time  0930    PT Time Calculation (min)  40 min    Equipment Utilized During Treatment  Gait belt    Activity Tolerance  Patient tolerated treatment well    Behavior During Therapy  [facility] for tasks assessed/performed       Past Medical History:  Diagnosis Date  . Allergy   . Asthma   . GERD (gastroesophageal reflux disease)   . History of chicken pox   . IBS (irritable bowel syndrome)   . Migraine   . Urinary tract infection     Past Surgical History:  Procedure Laterality Date  . ABDOMINAL HYSTERECTOMY    . CATARACT EXTRACTION, BILATERAL    . TUBAL LIGATION      There were no vitals filed for this visit.  Subjective Assessment - 02/26/18 0853    Subjective  Patient reports doing well; reports 3/10  pain to right knee currently; Patient denies any new falls; Reports adherence to HEP. Patient states, "I think that my balance is getting a little better."     Pertinent History  Pt reports that she is having trouble with her balance. She states that it started approximately one year ago without any known trigger and has been progressive since that time. She has the most difficulty with head turns while walking. She denies dizziness or vertigo. She believes that her symptoms are related to her anxiety. She reports recent visit to the ER secondary to anxiety and GI upset. She had an abdominal CT which was negative for acute findings. She reports recent problems with her  memory as well. She saw [facility] who reports that her formal neurocognitive testing did not show dementia but implicated stress, depression, and anxiety. Per neurology note brain MRI in 2019 was normal for her age. She states that her PCP started her on Zoloft approximately 6 months ago. She reports that she took one month and then never refilled the medication. She recently restarted her Zoloft one week ago. She has not disclosed that she stopped her Zoloft to her PCP. She does have a follow-up appt scheduled 02/19/18. ROS negative for red flags (bowel/bladder changes, saddle paresthesia, personal history of cancer, chills/fever, night sweats, unrelenting pain, weight gain/loss). Pt does report decreased appetite recently due to anxiety. She also reports poor sleep stating she only sleeps approximately 4 hours/night.     Limitations  Walking    Diagnostic tests  Abd CT: no acute changes, Brain MRI: WNL for age    Patient Stated Goals  Improve her balance    Currently in Pain?  Yes    Pain Score  3     Pain Location  Knee    Pain Orientation  Right    Pain Descriptors / Indicators  Aching    Pain Type  Chronic pain    Pain Onset  Yesterday    Pain Frequency  Intermittent    Aggravating Factors   activity    Pain Relieving Factors  rest    Effect of Pain on Daily Activities  none    Multiple Pain Sites  No       NEUROMUSCULAR RE-EDUCATION  Star stepping  cues to tap the end of  the lines  Airexfeet apart and tapping yellow disk x 20cues for posture correction  Lunging onto BOSU ball from purple foam  VC for correct technique and 1 UE assist, has poor quality of motion, needs cues to slow down x 10 x 2 BLE   Standing on disc x 2 side to side position  with baloon tapping to mirror x 2 mins , 25% need of UE support  Diagonalstepping tostepping stonefrom foamsurface x 10 x 3 sets, CGA  Diagonalstepping tostepping stoneon 6 inch stoolfrom foamsurface x 10 x 3  sets, CGA  Static standing one foot disk, one foot ballx 10 , CGA  BOSU ball squats, reaching to place cones on low stool  with 50% UE support x 2 mins  Tandem standing on 1/2 foam with flat side up and cone stacking on stool with need of min assist x 20 cones x 2 sets   Standing on 2 disks tandem with trunk rotation using rod and head turns with 30% UE assist with railing due to loss balance   Bosu ball flat side up and reaching to stack cones on low stool x 5 mins  Leg press 100 lbs x 20 x 2, cues to perform slowly  Matrix fwd/ bwd pull 17. 5 lbs, x 4 reps, side stepping x 2 reps and min assist   Cues for proper technique of exercises, slow eccentric contractions to target specific muscles and facility increased muscle building.  Patient attempts the balance exercises too quickly without control .  Patient has difficulty with disk WB and with head turns on                          PT Education - 02/26/18 0848    Education Details  HEP    Person(s) Educated  Patient    Methods  Explanation;Tactile cues;Verbal cues    Comprehension  Verbalized understanding;Returned demonstration;Need further instruction       PT Short Term Goals - 02/07/18 0920      PT SHORT TERM GOAL #1   Title  Pt will be independent with HEP in order to improve strength and balance in order to decrease fall risk and improve function at home and work.    Time  4    Period  Weeks    Status  New    Target Date  03/07/18        PT Long Term Goals - 02/07/18 0921      PT LONG TERM GOAL #1   Title  Pt will improve BERG by at least 3 points in order to demonstrate clinically significant improvement in balance.      Baseline  02/07/18: 52/56    Time  8    Period  Weeks    Status  New    Target Date  04/04/18      PT LONG TERM GOAL #2   Title  Pt will improve FGA by at least 4 points in order to demonstrate clinically significant improvement in balance and decreased  risk for falls.    Baseline  02/07/18: 19/30    Time  8  Period  Weeks    Status  New    Target Date  04/04/18      PT LONG TERM GOAL #3   Title  Pt will improve ABC by at least 13% in order to demonstrate clinically significant improvement in balance confidence.     Baseline  02/07/18: 65.63%    Time  8    Period  Weeks    Status  New    Target Date  04/04/18            Plan - 02/26/18 7622    Clinical Impression Statement  Pt presents with unsteadiness on uneven surfaces and fatigues with therapeutic exercises. Patient needs assist with advanced balance activities and needs CGA assist with standing activities. Patient demonstrates difficulty with dynamic standing balance and with narrow  base of support and increased challenges for LE.  Patient tolerated all interventions well this date and skilled PT will continue to improve mobility and strength.     Rehab Potential  Good    PT Frequency  2x / week    PT Duration  8 weeks    PT Treatment/Interventions  ADLs/Self Care Home Management;Aquatic Therapy;Biofeedback;Canalith Repostioning;Cryotherapy;Iontophoresis 4mg /ml Dexamethasone;Electrical Stimulation;Moist Heat;Traction;Ultrasound;DME Instruction;Gait training;Stair training;Functional mobility training;Therapeutic activities;Therapeutic exercise;Balance training;Neuromuscular re-education;Cognitive remediation;Patient/family education;Manual techniques;Dry needling;Energy conservation;Vestibular    PT Next Visit Plan  MOCA?, mCTSIB, progress balance and strengthening program, consider introduction of mindfulness or other stress reduction techniques    PT Home Exercise Plan  Semitandem balance with horiz head turns, full tandem static balance    Consulted and Agree with Plan of Care  Patient       Patient will benefit from skilled therapeutic intervention in order to improve the following deficits and impairments:  Decreased balance, Difficulty walking  Visit  Diagnosis: Unsteadiness on feet  History of falling     Problem List Patient Active Problem List   Diagnosis Date Noted  . Generalized abdominal pain 02/19/2018  . GAD (generalized anxiety disorder) 10/30/2017  . MDD (major depressive disorder), single episode, mild ([place]) 10/30/2017  . Chronic insomnia 10/30/2017  . TMJ tenderness, right 05/04/2017  . Post-nasal drip 05/04/2017  . Ear ringing sound, right 05/04/2017  . 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 Epidermoid cyst of finger of left hand 07/27/2016  . Osteopenia 11/18/2015  . Chronic constipation 07/20/2015  . Internal hemorrhoid, bleeding 07/20/2015  . 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 Counseling regarding end of life decision making 09/08/2014  . Childhood asthma 08/13/2014  . Gastroesophageal reflux disease without esophagitis 08/13/2014  . Irritable bowel syndrome with constipation 08/13/2014  . Hx of migraines 08/13/2014  . Family history of colon cancer 08/13/2014  . Family history of uterine cancer 08/13/2014  . Family history of breast cancer 08/13/2014    Alanson Puls, PT DPT 02/26/2018, 8:55 AM  [place] MAIN [facility] SERVICES [address] [place], [place], [address] Phone: [phone]   Fax:  [phone]  Name: Jessica Hardin MRN: [id number] Date of Birth: [birth year]

## 2018-02-26 NOTE — Telephone Encounter (Signed)
Is she taking after a  Full meal at bedtime? Did she give it 1 week before giving up on it? If not  I would try again.   if not wanting to try again.. Does she want to try a different medication?

## 2018-02-28 ENCOUNTER — Encounter: Payer: Self-pay | Admitting: Physical Therapy

## 2018-02-28 ENCOUNTER — Ambulatory Visit: Payer: PPO | Admitting: Physical Therapy

## 2018-02-28 DIAGNOSIS — Z9181 History of falling: Secondary | ICD-10-CM

## 2018-02-28 DIAGNOSIS — R2681 Unsteadiness on feet: Secondary | ICD-10-CM | POA: Diagnosis not present

## 2018-02-28 NOTE — Therapy (Signed)
[place] MAIN [facility] SERVICES [address] [place], [place], [address] Phone: [phone]   Fax:  [phone]  Physical Therapy Treatment  Patient Details  Name: Jessica Hardin MRN: [id number] Date of Birth: September 28, 1940 Referring Provider: Dr. Diona Browner   Encounter Date: 02/28/2018  PT End of Session - 02/28/18 0854    Visit Number  6    Number of Visits  17    Date for PT Re-Evaluation  04/04/18    Authorization Type  Progress note: 6/10    PT Start Time  0848    PT Stop Time  0927    PT Time Calculation (min)  39 min    Equipment Utilized During Treatment  Gait belt    Activity Tolerance  Patient tolerated treatment well    Behavior During Therapy  [facility] for tasks assessed/performed       Past Medical History:  Diagnosis Date  . Allergy   . Asthma   . GERD (gastroesophageal reflux disease)   . History of chicken pox   . IBS (irritable bowel syndrome)   . Migraine   . Urinary tract infection     Past Surgical History:  Procedure Laterality Date  . ABDOMINAL HYSTERECTOMY    . CATARACT EXTRACTION, BILATERAL    . TUBAL LIGATION      There were no vitals filed for this visit.  Subjective Assessment - 02/28/18 0854    Subjective  Patient reports doing well; reports 3/10  pain to right knee currently; Patient denies any new falls; Reports adherence to HEP. Patient states, "I think that my balance is getting a little better."     Pertinent History  Pt reports that she is having trouble with her balance. She states that it started approximately one year ago without any known trigger and has been progressive since that time. She has the most difficulty with head turns while walking. She denies dizziness or vertigo. She believes that her symptoms are related to her anxiety. She reports recent visit to the ER secondary to anxiety and GI upset. She had an abdominal CT which was negative for acute findings. She reports recent problems with her  memory as well. She saw [facility] who reports that her formal neurocognitive testing did not show dementia but implicated stress, depression, and anxiety. Per neurology note brain MRI in 2019 was normal for her age. She states that her PCP started her on Zoloft approximately 6 months ago. She reports that she took one month and then never refilled the medication. She recently restarted her Zoloft one week ago. She has not disclosed that she stopped her Zoloft to her PCP. She does have a follow-up appt scheduled 02/19/18. ROS negative for red flags (bowel/bladder changes, saddle paresthesia, personal history of cancer, chills/fever, night sweats, unrelenting pain, weight gain/loss). Pt does report decreased appetite recently due to anxiety. She also reports poor sleep stating she only sleeps approximately 4 hours/night.     Limitations  Walking    Diagnostic tests  Abd CT: no acute changes, Brain MRI: WNL for age    Patient Stated Goals  Improve her balance    Currently in Pain?  No/denies    Pain Score  0-No pain    Pain Onset  Yesterday      Treatment:  Airex pad  Hold ball and trunk rotation   x2 min, CGA for safety, demonstrated difficulty with trunk rotation and keeping arms extended  Airex pad, balloon tapping to  mirror x 2 min, supervision for safety with varying directions and speed of balloon, VCs for utilizing both hands and minimizing UE support  Agility ladder, stepping in and out of squares with fast pace and CGA  Step over hurdle fwd/ bwd, side to side , CGA for safety, VCs to take big enough steps and to try to increase speed to work on coordination   Side stepping x10 on blue balance CGA for safety, VCs for taking a big enough step    airex beam:: Tandem stance on airex beam without rail assist 10 sec hold x4 each foot in front with CGA for safety and cues to improve upper trunk control for better balance control  Side stepping down airex beam without rail assist x3 laps  each direction with cues to keep feet on beam and avoid stepping off;  Standing with feet apart, BUE ball toss x10 unsupported with close supervision; Patient exhibits posterior loss of balance requiring cues for forward weight shift;   Standing on 1/2 bolster (flat side up): Feet flat, apart, BUE wand flexion x10 reps with min A For safety; Patient able to keep balance well with minimal posterior loss of balance  stepping over hurdle x 10 fwd / bwd, 10 side to side with UE support   Leg press x 100 lbs x 20 x 2  Purple foam and lunge to BOSU ball x 10 x 2 BLE  Patient required min-moderate verbal/tactile cues for correct exercise technique with cues for tband placement and to increase ROM for better strengthening;                         PT Education - 02/28/18 0854    Education Details  HEP, saftey, balance    Person(s) Educated  Patient    Methods  Explanation;Tactile cues;Demonstration    Comprehension  Verbalized understanding;Returned demonstration;Need further instruction       PT Short Term Goals - 02/07/18 0920      PT SHORT TERM GOAL #1   Title  Pt will be independent with HEP in order to improve strength and balance in order to decrease fall risk and improve function at home and work.    Time  4    Period  Weeks    Status  New    Target Date  03/07/18        PT Long Term Goals - 02/07/18 0921      PT LONG TERM GOAL #1   Title  Pt will improve BERG by at least 3 points in order to demonstrate clinically significant improvement in balance.      Baseline  02/07/18: 52/56    Time  8    Period  Weeks    Status  New    Target Date  04/04/18      PT LONG TERM GOAL #2   Title  Pt will improve FGA by at least 4 points in order to demonstrate clinically significant improvement in balance and decreased risk for falls.    Baseline  02/07/18: 19/30    Time  8    Period  Weeks    Status  New    Target Date  04/04/18      PT LONG TERM GOAL #3    Title  Pt will improve ABC by at least 13% in order to demonstrate clinically significant improvement in balance confidence.     Baseline  02/07/18: 65.63%    Time  8    Period  Weeks    Status  New    Target Date  04/04/18            Plan - 02/28/18 0856    Clinical Impression Statement  Pt presents with unsteadiness on uneven surfaces and fatigues with therapeutic exercises. Patient needs assist with beginning moderate balance activities and needs CGA assist with standing activities. Patient demonstrates difficulty with dynamic standing balance and with narrow  base of support and increased challenges for LE.  Patient tolerated all interventions well this date and skilled PT will continue to improve mobility and strength.     Rehab Potential  Good    PT Frequency  2x / week    PT Duration  8 weeks    PT Treatment/Interventions  ADLs/Self Care Home Management;Aquatic Therapy;Biofeedback;Canalith Repostioning;Cryotherapy;Iontophoresis 4mg /ml Dexamethasone;Electrical Stimulation;Moist Heat;Traction;Ultrasound;DME Instruction;Gait training;Stair training;Functional mobility training;Therapeutic activities;Therapeutic exercise;Balance training;Neuromuscular re-education;Cognitive remediation;Patient/family education;Manual techniques;Dry needling;Energy conservation;Vestibular    PT Next Visit Plan  MOCA?, mCTSIB, progress balance and strengthening program, consider introduction of mindfulness or other stress reduction techniques    PT Home Exercise Plan  Semitandem balance with horiz head turns, full tandem static balance    Consulted and Agree with Plan of Care  Patient       Patient will benefit from skilled therapeutic intervention in order to improve the following deficits and impairments:  Decreased balance, Difficulty walking  Visit Diagnosis: Unsteadiness on feet  History of falling     Problem List Patient Active Problem List   Diagnosis Date Noted  . Generalized abdominal  pain 02/19/2018  . GAD (generalized anxiety disorder) 10/30/2017  . MDD (major depressive disorder), single episode, mild ([place]) 10/30/2017  . Chronic insomnia 10/30/2017  . TMJ tenderness, right 05/04/2017  . Post-nasal drip 05/04/2017  . Ear ringing sound, right 05/04/2017  . 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 Epidermoid cyst of finger of left hand 07/27/2016  . Osteopenia 11/18/2015  . Chronic constipation 07/20/2015  . Internal hemorrhoid, bleeding 07/20/2015  . 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 Counseling regarding end of life decision making 09/08/2014  . Childhood asthma 08/13/2014  . Gastroesophageal reflux disease without esophagitis 08/13/2014  . Irritable bowel syndrome with constipation 08/13/2014  . Hx of migraines 08/13/2014  . Family history of colon cancer 08/13/2014  . Family history of uterine cancer 08/13/2014  . Family history of breast cancer 08/13/2014    Alanson Puls, PT DPT 02/28/2018, 8:58 AM  [place] MAIN [facility] SERVICES [place], [place], [address] Phone: [phone]   Fax:  [phone]  Name: RILY NICKEY MRN: [id number] Date of Birth: 1941/02/10

## 2018-03-01 ENCOUNTER — Telehealth: Payer: Self-pay | Admitting: Family Medicine

## 2018-03-01 MED ORDER — ESCITALOPRAM OXALATE 10 MG PO TABS: 10.0000 mg | ORAL_TABLET | Freq: Every day | ORAL | 3 refills | Status: DC

## 2018-03-01 NOTE — Addendum Note (Signed)
Addended by: Eliezer Lofts E on: 03/01/2018 05:25 PM   Modules accepted: Orders

## 2018-03-01 NOTE — Telephone Encounter (Signed)
Copied from Box [id number]. Topic: Quick Communication - See Telephone Encounter >> Mar 01, 2018  1:15 PM [place], [place] wrote: CRM for notification. See Telephone encounter for: 03/01/18. Patient calling to see if Dr Diona Browner could prescribe something for the patient's stomach. States that the sertraline (ZOLOFT) 50 MG tablet is making her stomach hurt. Would like something sent to the pharmacy or go back down to the 25mg . Please advise. [place], [place]

## 2018-03-01 NOTE — Telephone Encounter (Addendum)
Sent in rx for  pt to try.

## 2018-03-01 NOTE — Telephone Encounter (Signed)
Is she open to trying a different med for mood?

## 2018-03-01 NOTE — Telephone Encounter (Signed)
Called and spoke with patient she is open to trying a new medication if it doesn't effect anything else. She would like for you to send it to pharmacy.

## 2018-03-04 NOTE — Telephone Encounter (Signed)
Spoke to patient and was advised that she picked the medication up and has concerns about taking it. Patient stated that she is not going to take it until she comes in and talks to Dr. Diona Hardin about it. Appointment scheduled for Friday 03/08/18.

## 2018-03-05 ENCOUNTER — Ambulatory Visit: Payer: PPO | Admitting: Physical Therapy

## 2018-03-05 ENCOUNTER — Encounter: Payer: Self-pay | Admitting: Physical Therapy

## 2018-03-05 DIAGNOSIS — R2681 Unsteadiness on feet: Secondary | ICD-10-CM

## 2018-03-05 DIAGNOSIS — Z9181 History of falling: Secondary | ICD-10-CM

## 2018-03-05 NOTE — Telephone Encounter (Signed)
Noted  

## 2018-03-05 NOTE — Therapy (Signed)
[place] MAIN [facility] SERVICES [address] [place], [place], [address] Phone: [phone]   Fax:  [phone]  Physical Therapy Treatment  Patient Details  Name: Jessica Hardin MRN: [id number] Date of Birth: Apr 20, 1941 Referring Provider: Dr. Diona Browner   Encounter Date: 03/05/2018  PT End of Session - 03/05/18 0854    Visit Number  7    Number of Visits  17    Date for PT Re-Evaluation  04/04/18    Authorization Type  Progress note: 7/10    PT Start Time  0935    PT Stop Time  1015    PT Time Calculation (min)  40 min    Equipment Utilized During Treatment  Gait belt    Activity Tolerance  Patient tolerated treatment well    Behavior During Therapy  [facility] for tasks assessed/performed       Past Medical History:  Diagnosis Date  . Allergy   . Asthma   . GERD (gastroesophageal reflux disease)   . History of chicken pox   . IBS (irritable bowel syndrome)   . Migraine   . Urinary tract infection     Past Surgical History:  Procedure Laterality Date  . ABDOMINAL HYSTERECTOMY    . CATARACT EXTRACTION, BILATERAL    . TUBAL LIGATION      There were no vitals filed for this visit.  Subjective Assessment - 03/05/18 0850    Subjective  Patient reports doing well; reports no pain currently; Patient denies any new falls; Reports adherence to HEP. Patient states, "I think that my balance is getting a little better."     Pertinent History  Pt reports that she is having trouble with her balance. She states that it started approximately one year ago without any known trigger and has been progressive since that time. She has the most difficulty with head turns while walking. She denies dizziness or vertigo. She believes that her symptoms are related to her anxiety. She reports recent visit to the ER secondary to anxiety and GI upset. She had an abdominal CT which was negative for acute findings. She reports recent problems with her memory as well.  She saw [facility] who reports that her formal neurocognitive testing did not show dementia but implicated stress, depression, and anxiety. Per neurology note brain MRI in 2019 was normal for her age. She states that her PCP started her on Zoloft approximately 6 months ago. She reports that she took one month and then never refilled the medication. She recently restarted her Zoloft one week ago. She has not disclosed that she stopped her Zoloft to her PCP. She does have a follow-up appt scheduled 02/19/18. ROS negative for red flags (bowel/bladder changes, saddle paresthesia, personal history of cancer, chills/fever, night sweats, unrelenting pain, weight gain/loss). Pt does report decreased appetite recently due to anxiety. She also reports poor sleep stating she only sleeps approximately 4 hours/night.     Limitations  Walking    Diagnostic tests  Abd CT: no acute changes, Brain MRI: WNL for age    Patient Stated Goals  Improve her balance    Currently in Pain?  No/denies    Pain Score  0-No pain    Pain Onset  Yesterday       NEUROMUSCULAR RE-EDUCATION  Star stepping  cues to tap the end of  the lines, CGA  Lunging onto BOSU ballfrom purple foamVC for correct technique and 1 UE assist, has poor quality of motion,  needs cues to slow down x 10 x 2 BLE   Standing on disc x 2 side to side position  withbaloon tapping to mirrorx 2 mins , 25% need of UE support  BOSU ball squats, reaching to place cones on low stool with 50% UE supportx 2 mins  Tandem standing on 1/2 foam with flat side up and cone stacking on stool with need of min assist x 20 cones x 2 sets  Standing on 2 disks tandem with trunk rotation using rod and head turns with 30% UE assist with railing due to loss balance   Bosu ball flat side up and reaching to stack cones on low stool x 5 mins  Leg press 100 lbs x 20 x 2, cues to perform slowly  Matrix fwd/ bwd pull 17. 5 lbs, x 4 reps, side stepping x 2  reps and min assist   Cues for proper technique of exercises, slow eccentric contractions to target specific muscles and facility increased muscle building. Patient needs cues for slowing down her movements for better control .                        PT Education - 03/05/18 0853    Education Details  HEP    Person(s) Educated  Patient    Methods  Explanation;Demonstration;Verbal cues    Comprehension  Verbalized understanding;Returned demonstration;Verbal cues required;Need further instruction       PT Short Term Goals - 02/07/18 0920      PT SHORT TERM GOAL #1   Title  Pt will be independent with HEP in order to improve strength and balance in order to decrease fall risk and improve function at home and work.    Time  4    Period  Weeks    Status  New    Target Date  03/07/18        PT Long Term Goals - 02/07/18 0921      PT LONG TERM GOAL #1   Title  Pt will improve BERG by at least 3 points in order to demonstrate clinically significant improvement in balance.      Baseline  02/07/18: 52/56    Time  8    Period  Weeks    Status  New    Target Date  04/04/18      PT LONG TERM GOAL #2   Title  Pt will improve FGA by at least 4 points in order to demonstrate clinically significant improvement in balance and decreased risk for falls.    Baseline  02/07/18: 19/30    Time  8    Period  Weeks    Status  New    Target Date  04/04/18      PT LONG TERM GOAL #3   Title  Pt will improve ABC by at least 13% in order to demonstrate clinically significant improvement in balance confidence.     Baseline  02/07/18: 65.63%    Time  8    Period  Weeks    Status  New    Target Date  04/04/18            Plan - 03/05/18 0854    Clinical Impression Statement  Pt was able to progress dynamic balance exercises today, noting improved postural reactions with LOB and moving outside normal BOS. Pt was able to perform all exercises on uneven surfaces with  minimal LOB noted. Single limb stability activities were added  today, with decrease control noted when attempting to perform tasks with single leg. Pt would continue to benefit from skilled therapy services to address further balance impairments and decrease falls risk    Rehab Potential  Good    PT Frequency  2x / week    PT Duration  8 weeks    PT Treatment/Interventions  ADLs/Self Care Home Management;Aquatic Therapy;Biofeedback;Canalith Repostioning;Cryotherapy;Iontophoresis 4mg /ml Dexamethasone;Electrical Stimulation;Moist Heat;Traction;Ultrasound;DME Instruction;Gait training;Stair training;Functional mobility training;Therapeutic activities;Therapeutic exercise;Balance training;Neuromuscular re-education;Cognitive remediation;Patient/family education;Manual techniques;Dry needling;Energy conservation;Vestibular    PT Next Visit Plan  MOCA?, mCTSIB, progress balance and strengthening program, consider introduction of mindfulness or other stress reduction techniques    PT Home Exercise Plan  Semitandem balance with horiz head turns, full tandem static balance    Consulted and Agree with Plan of Care  Patient       Patient will benefit from skilled therapeutic intervention in order to improve the following deficits and impairments:  Decreased balance, Difficulty walking  Visit Diagnosis: Unsteadiness on feet  History of falling     Problem List Patient Active Problem List   Diagnosis Date Noted  . Generalized abdominal pain 02/19/2018  . GAD (generalized anxiety disorder) 10/30/2017  . MDD (major depressive disorder), single episode, mild ([place]) 10/30/2017  . Chronic insomnia 10/30/2017  . TMJ tenderness, right 05/04/2017  . Post-nasal drip 05/04/2017  . Ear ringing sound, right 05/04/2017  . 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 Epidermoid cyst of finger of left hand 07/27/2016  . Osteopenia 11/18/2015  . Chronic constipation 07/20/2015  . Internal hemorrhoid, bleeding  07/20/2015  . 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 Counseling regarding end of life decision making 09/08/2014  . Childhood asthma 08/13/2014  . Gastroesophageal reflux disease without esophagitis 08/13/2014  . Irritable bowel syndrome with constipation 08/13/2014  . Hx of migraines 08/13/2014  . Family history of colon cancer 08/13/2014  . Family history of uterine cancer 08/13/2014  . Family history of breast cancer 08/13/2014    Alanson Puls, PT DPT 03/05/2018, 9:15 AM  [place] MAIN [facility] SERVICES [place], [place], [address] Phone: [phone]   Fax:  [phone]  Name: Jessica Hardin MRN: [id number] Date of Birth: [birth year]

## 2018-03-07 ENCOUNTER — Ambulatory Visit: Payer: PPO | Admitting: Physical Therapy

## 2018-03-07 ENCOUNTER — Encounter: Payer: Self-pay | Admitting: Physical Therapy

## 2018-03-07 DIAGNOSIS — Z9181 History of falling: Secondary | ICD-10-CM

## 2018-03-07 DIAGNOSIS — R2681 Unsteadiness on feet: Secondary | ICD-10-CM | POA: Diagnosis not present

## 2018-03-07 NOTE — Therapy (Signed)
[place] MAIN [facility] SERVICES [address] [place], [place], [address] Phone: [phone]   Fax:  [phone]  Physical Therapy Treatment  Patient Details  Name: Jessica Hardin MRN: [id number] Date of Birth: [birth year] Referring Provider: Dr. Diona Browner   Encounter Date: 03/07/2018  PT End of Session - 03/07/18 0843    Visit Number  8    Number of Visits  17    Date for PT Re-Evaluation  04/04/18    Authorization Type  Progress note: 8/10    PT Start Time  0845    PT Stop Time  0930    PT Time Calculation (min)  45 min    Equipment Utilized During Treatment  Gait belt    Activity Tolerance  Patient tolerated treatment well    Behavior During Therapy  [facility] for tasks assessed/performed       Past Medical History:  Diagnosis Date  . Allergy   . Asthma   . GERD (gastroesophageal reflux disease)   . History of chicken pox   . IBS (irritable bowel syndrome)   . Migraine   . Urinary tract infection     Past Surgical History:  Procedure Laterality Date  . ABDOMINAL HYSTERECTOMY    . CATARACT EXTRACTION, BILATERAL    . TUBAL LIGATION      There were no vitals filed for this visit.  Subjective Assessment - 03/07/18 0842    Subjective  Patient reports doing well; reports no pain currently; Patient denies any new falls; Reports adherence to HEP. Patient states, "I think that my balance is getting a little better."     Pertinent History  Pt reports that she is having trouble with her balance. She states that it started approximately one year ago without any known trigger and has been progressive since that time. She has the most difficulty with head turns while walking. She denies dizziness or vertigo. She believes that her symptoms are related to her anxiety. She reports recent visit to the ER secondary to anxiety and GI upset. She had an abdominal CT which was negative for acute findings. She reports recent problems with her memory as well.  She saw [facility]rology who reports that her formal neurocognitive testing did not show dementia but implicated stress, depression, and anxiety. Per neurology note brain MRI in 2019 was normal for her age. She states that her PCP started her on Zoloft approximately 6 months ago. She reports that she took one month and then never refilled the medication. She recently restarted her Zoloft one week ago. She has not disclosed that she stopped her Zoloft to her PCP. She does have a follow-up appt scheduled 02/19/18. ROS negative for red flags (bowel/bladder changes, saddle paresthesia, personal history of cancer, chills/fever, night sweats, unrelenting pain, weight gain/loss). Pt does report decreased appetite recently due to anxiety. She also reports poor sleep stating she only sleeps approximately 4 hours/night.     Limitations  Walking    Diagnostic tests  Abd CT: no acute changes, Brain MRI: WNL for age    Patient Stated Goals  Improve her balance    Pain Onset  Yesterday         TREATMENT: Warm up on octane fitness BUE/BLE level 2 x3 min (Unbilled);  Matrix 17. 5 lbs side stepping and fwd/bwd walking x 3 laps and vc for control and to slow down   Diagonal stepping over 1/2 foam  forward/backward x2 laps; Tandem stance 2 x 4  with BUE with ball self toss  x2 laps-CGA for safety with cues to improve upper trunk control for less loss of balance with ball pass;  Side stepping on 2 x 4  with BUE ball up/down x2 laps each direction;   Side stepping over 1/2 foam and box  x 2 laps each direction; Forward/backward side step 4 square x10 each direction; Required CGA to close supervision for safety and min VCs for sequencing  Fwd/ bwd walking fast and stopping, turning with fast walking  x5 laps each with mod VCS for sequencing.. Patient had difficulty with turning left quickly and  changing from forward to backward walking;    Forward walking level surface eyes closed x20 feet x3 laps Backward  walking eyes closed  level surface x20 feet x3 laps; path deviation with bwd walking   Step up from foam  for SLS 3 sec hold x5 reps each LE, unsupported; CGA for safety with cues to improve trunk control for better static balance;  Side step up and over 4 inch step with airex pad,  X 10  25 % need of UE for support  Advanced HEP with backward walking;                        PT Education - 03/07/18 0842    Education Details  HEP,balance, saftey    Person(s) Educated  Patient    Methods  Explanation    Comprehension  Verbalized understanding;Returned demonstration;Need further instruction       PT Short Term Goals - 02/07/18 0920      PT SHORT TERM GOAL #1   Title  Pt will be independent with HEP in order to improve strength and balance in order to decrease fall risk and improve function at home and work.    Time  4    Period  Weeks    Status  New    Target Date  03/07/18        PT Long Term Goals - 02/07/18 0921      PT LONG TERM GOAL #1   Title  Pt will improve BERG by at least 3 points in order to demonstrate clinically significant improvement in balance.      Baseline  02/07/18: 52/56    Time  8    Period  Weeks    Status  New    Target Date  04/04/18      PT LONG TERM GOAL #2   Title  Pt will improve FGA by at least 4 points in order to demonstrate clinically significant improvement in balance and decreased risk for falls.    Baseline  02/07/18: 19/30    Time  8    Period  Weeks    Status  New    Target Date  04/04/18      PT LONG TERM GOAL #3   Title  Pt will improve ABC by at least 13% in order to demonstrate clinically significant improvement in balance confidence.     Baseline  02/07/18: 65.63%    Time  8    Period  Weeks    Status  New    Target Date  04/04/18            Plan - 03/07/18 0844    Clinical Impression Statement  Pt was able to progress through exercises today with decreases in loss of dynamic standing  during  reaching activities.  Pt continues to demonstrate improvement  with dynamic and static balance, with decreased LOB noted with dynamic activities on even and uneven surfaces.  Pt has decreased  strength and single leg stability.  Pt would continue to benefit from skilled therapy services in order to continue strengthening LE's and improving dynamic and static balance.    Rehab Potential  Good    PT Frequency  2x / week    PT Duration  8 weeks    PT Treatment/Interventions  ADLs/Self Care Home Management;Aquatic Therapy;Biofeedback;Canalith Repostioning;Cryotherapy;Iontophoresis 4mg /ml Dexamethasone;Electrical Stimulation;Moist Heat;Traction;Ultrasound;DME Instruction;Gait training;Stair training;Functional mobility training;Therapeutic activities;Therapeutic exercise;Balance training;Neuromuscular re-education;Cognitive remediation;Patient/family education;Manual techniques;Dry needling;Energy conservation;Vestibular    PT Next Visit Plan  MOCA?, mCTSIB, progress balance and strengthening program, consider introduction of mindfulness or other stress reduction techniques    PT Home Exercise Plan  Semitandem balance with horiz head turns, full tandem static balance    Consulted and Agree with Plan of Care  Patient       Patient will benefit from skilled therapeutic intervention in order to improve the following deficits and impairments:  Decreased balance, Difficulty walking  Visit Diagnosis: Unsteadiness on feet  History of falling     Problem List Patient Active Problem List   Diagnosis Date Noted  . Generalized abdominal pain 02/19/2018  . GAD (generalized anxiety disorder) 10/30/2017  . MDD (major depressive disorder), single episode, mild ([place]) 10/30/2017  . Chronic insomnia 10/30/2017  . TMJ tenderness, right 05/04/2017  . Post-nasal drip 05/04/2017  . Ear ringing sound, right 05/04/2017  . 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 Epidermoid cyst of finger of left hand 07/27/2016   . Osteopenia 11/18/2015  . Chronic constipation 07/20/2015  . Internal hemorrhoid, bleeding 07/20/2015  . 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 Counseling regarding end of life decision making 09/08/2014  . Childhood asthma 08/13/2014  . Gastroesophageal reflux disease without esophagitis 08/13/2014  . Irritable bowel syndrome with constipation 08/13/2014  . Hx of migraines 08/13/2014  . Family history of colon cancer 08/13/2014  . Family history of uterine cancer 08/13/2014  . Family history of breast cancer 08/13/2014    Alanson Puls, PT DPT 03/07/2018, 8:45 AM  [place] MAIN [facility] SERVICES [place], [place], [address] Phone: [phone]   Fax:  [phone]  Name: Jessica Hardin MRN: [id number] Date of Birth: 04-12-1941

## 2018-03-08 ENCOUNTER — Encounter: Payer: Self-pay | Admitting: Family Medicine

## 2018-03-08 ENCOUNTER — Ambulatory Visit (INDEPENDENT_AMBULATORY_CARE_PROVIDER_SITE_OTHER): Payer: PPO | Admitting: Family Medicine

## 2018-03-08 VITALS — BP 110/60 | HR 66 | Temp 98.4°F | Ht 58.5 in | Wt 126.0 lb

## 2018-03-08 DIAGNOSIS — F411 Generalized anxiety disorder: Secondary | ICD-10-CM | POA: Diagnosis not present

## 2018-03-08 DIAGNOSIS — F320 Major depressive disorder, single episode, mild: Secondary | ICD-10-CM

## 2018-03-08 DIAGNOSIS — R413 Other amnesia: Secondary | ICD-10-CM

## 2018-03-08 NOTE — Progress Notes (Signed)
Subjective:    Patient ID: Jessica Hardin, female    DOB: [birth year], 77 y.o.   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  SE to 50 mg of sertraline.Marland Kitchen Upset her stomach.  She is hesitant about lexapro given associated SE of possible worsening of mood and suicidality listed.   She reports that she has been feeling more clear with her thinking in the last month since being off sertraline.  She already does mental strengthening activities and games.  She gets regular exercise.   She continues to have spells of anxiety and feeling tight in her stomach with unease. She gets upset when her fusses at her for misplacing items.  She does not eat well.  Wt Readings from Last 3 Encounters:  03/08/18 126 lb (57.2 kg)  02/19/18 127 lb 8 oz (57.8 kg)  02/04/18 127 lb 12.8 oz (58 kg)   Body mass index is 25.89 kg/m.  Review of Systems  Constitutional: Positive for weight loss.  HENT: Negative for ear pain.   Eyes: Negative for pain.  Respiratory: Negative for cough.   Cardiovascular: Negative for chest pain.  Gastrointestinal: Negative for abdominal pain, constipation, diarrhea, nausea and vomiting.  Neurological: Positive for dizziness.       Objective:   Physical Exam  Constitutional: She is oriented to person, place, and time. Vital signs are normal. She appears well-developed and well-nourished. She is cooperative.  Non-toxic appearance. She does not appear ill. No distress.  HENT:  Head: Normocephalic.  Right Ear: Hearing, tympanic membrane, external ear and ear canal normal. Tympanic membrane is not erythematous, not retracted and not bulging.  Left Ear: Hearing, tympanic membrane, external ear and ear canal normal. Tympanic membrane is not erythematous, not retracted and not bulging.  Nose: No mucosal edema or rhinorrhea. Right sinus exhibits no maxillary sinus tenderness and no frontal sinus tenderness. Left  sinus exhibits no maxillary sinus tenderness and no frontal sinus tenderness.  Mouth/Throat: Uvula is midline, oropharynx is clear and moist and mucous membranes are normal.  Eyes: Pupils are equal, round, and reactive to light. Conjunctivae, EOM and lids are normal. Lids are everted and swept, no foreign bodies found.  Neck: Trachea normal and normal range of motion. Neck supple. Carotid bruit is not present. No thyroid mass and no thyromegaly present.  Cardiovascular: Normal rate, regular rhythm, S1 normal, S2 normal, normal heart sounds, intact distal pulses and normal pulses. Exam reveals no gallop and no friction rub.  No murmur heard. Pulmonary/Chest: Effort normal and breath sounds normal. No tachypnea. No respiratory distress. She has no decreased breath sounds. She has no wheezes. She has no rhonchi. She has no rales.  Abdominal: Soft. Normal appearance and bowel sounds are normal. There is no tenderness.  Neurological: She is alert and oriented to person, place, and time. She has normal strength and normal reflexes. No cranial nerve deficit or sensory deficit. She exhibits normal muscle tone. She displays a negative Romberg sign. Coordination and gait normal. GCS eye subscore is 4. GCS verbal subscore is 5. GCS motor subscore is 6.  Nml cerebellar exam   No papilledema  Skin: Skin is warm, dry and intact. No rash noted.  Psychiatric: She has a normal mood and affect. Her speech is normal and behavior is normal. Judgment and thought content normal. Her mood appears not anxious. Cognition and memory are normal. Cognition and memory are not impaired. She does  not exhibit a depressed mood. She exhibits normal recent memory and normal remote memory.          Assessment & Plan:

## 2018-03-08 NOTE — Assessment & Plan Note (Signed)
   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

## 2018-03-08 NOTE — Patient Instructions (Addendum)
Please stop at the front desk to set up referral.  Stay off medication for now.  Cancel OCtober appt .Marland Kitchen Change to 3 month follow up

## 2018-03-08 NOTE — Assessment & Plan Note (Signed)
Per neurocog eval.. Memory issues secondary to mood. She has not tolerated 50 mg of sertraline and lower dose made no difference in mood. Given thinking seems clearer off the med, we will continue off meds.  I will refer her to counselor to discuss better way to handle anxiety and mood and relaxation techniques.  If not improving in 3 months consider trial of remeron.

## 2018-03-12 ENCOUNTER — Ambulatory Visit: Payer: PPO | Admitting: Physical Therapy

## 2018-03-14 ENCOUNTER — Ambulatory Visit: Payer: PPO | Admitting: Physical Therapy

## 2018-03-14 ENCOUNTER — Encounter: Payer: Self-pay | Admitting: Physical Therapy

## 2018-03-14 DIAGNOSIS — R2681 Unsteadiness on feet: Secondary | ICD-10-CM

## 2018-03-14 DIAGNOSIS — Z9181 History of falling: Secondary | ICD-10-CM

## 2018-03-14 NOTE — Therapy (Signed)
[place] MAIN [facility] SERVICES [address] [place], [place], [address] Phone: [phone]   Fax:  [phone]  Physical Therapy Treatment  Patient Details  Name: Jessica Hardin MRN: [id number] Date of Birth: [birth year] Referring Provider: Dr. Diona Browner   Encounter Date: 03/14/2018  PT End of Session - 03/14/18 0909    Visit Number  9    Number of Visits  17    Date for PT Re-Evaluation  04/04/18    Authorization Type  Progress note: 9/10    PT Start Time  0845    PT Stop Time  0930    PT Time Calculation (min)  45 min    Equipment Utilized During Treatment  Gait belt    Activity Tolerance  Patient tolerated treatment well    Behavior During Therapy  [facility] for tasks assessed/performed       Past Medical History:  Diagnosis Date  . Allergy   . Asthma   . GERD (gastroesophageal reflux disease)   . History of chicken pox   . IBS (irritable bowel syndrome)   . Migraine   . Urinary tract infection     Past Surgical History:  Procedure Laterality Date  . ABDOMINAL HYSTERECTOMY    . CATARACT EXTRACTION, BILATERAL    . TUBAL LIGATION      There were no vitals filed for this visit.  Subjective Assessment - 03/14/18 0907    Subjective  Patient reports doing well; reports no pain currently; Patient denies any new falls; Reports adherence to HEP. Patient is feeling a little less anxious.    Pertinent History  Pt reports that she is having trouble with her balance. She states that it started approximately one year ago without any known trigger and has been progressive since that time. She has the most difficulty with head turns while walking. She denies dizziness or vertigo. She believes that her symptoms are related to her anxiety. She reports recent visit to the ER secondary to anxiety and GI upset. She had an abdominal CT which was negative for acute findings. She reports recent problems with her memory as well. She saw [facility] who  reports that her formal neurocognitive testing did not show dementia but implicated stress, depression, and anxiety. Per neurology note brain MRI in 2019 was normal for her age. She states that her PCP started her on Zoloft approximately 6 months ago. She reports that she took one month and then never refilled the medication. She recently restarted her Zoloft one week ago. She has not disclosed that she stopped her Zoloft to her PCP. She does have a follow-up appt scheduled 02/19/18. ROS negative for red flags (bowel/bladder changes, saddle paresthesia, personal history of cancer, chills/fever, night sweats, unrelenting pain, weight gain/loss). Pt does report decreased appetite recently due to anxiety. She also reports poor sleep stating she only sleeps approximately 4 hours/night.     Limitations  Walking    Diagnostic tests  Abd CT: no acute changes, Brain MRI: WNL for age    Patient Stated Goals  Improve her balance    Currently in Pain?  No/denies    Pain Score  0-No pain    Pain Onset  Yesterday    Multiple Pain Sites  No         Treatment: Airex pad  Hold ball and trunk rotation   x2 min, CGA for safety, demonstrated difficulty with trunk rotation and keeping arms extended  Airex pad, balloon tapping to  mirror x 2 min, supervision for safety with varying directions and speed of balloon, VCs for utilizing both hands and minimizing UE support  Agility ladder, stepping in and out of squares with fast pace and CGA  Step over hurdle fwd/ bwd, side to side , CGA for safety, VCs to take big enough steps and to try to increase speed to work on coordination   Side stepping x10 on blue balance CGA for safety, VCs for taking a big enough step    airex beam:: Tandem stance on airex beam without rail assist 10 sec hold x4 each foot in front with CGA for safety and cues to improve upper trunk control for better balance control  Side stepping down airex beam without rail assist x3 laps each  direction with cues to keep feet on beam and avoid stepping off;  Standing with feet apart, BUE ball toss x10 unsupportedwith close supervision; Patient exhibits posterior loss of balance requiring cues for forward weight shift;  Standing on 1/2 bolster (flat side up): Feet flat, apart, BUE wand flexion x10 reps with min A For safety; Patient able to keep balance well with minimal posterior loss of balance  stepping over hurdle x 10 fwd / bwd, 10 side to side with UE support   Leg press x 100 lbs x 20 x 2  Purple foam and lunge to BOSU ball x 10 x 2 BLE  Patient required min-moderate verbal/tactile cues for correct exercise techniquewith cues for tband placement and to increase ROM for better strengthening;                       PT Education - 03/14/18 0909    Education Details  HEP    Person(s) Educated  Patient    Methods  Explanation;Demonstration    Comprehension  Verbalized understanding;Returned demonstration;Need further instruction       PT Short Term Goals - 02/07/18 0920      PT SHORT TERM GOAL #1   Title  Pt will be independent with HEP in order to improve strength and balance in order to decrease fall risk and improve function at home and work.    Time  4    Period  Weeks    Status  New    Target Date  03/07/18        PT Long Term Goals - 02/07/18 0921      PT LONG TERM GOAL #1   Title  Pt will improve BERG by at least 3 points in order to demonstrate clinically significant improvement in balance.      Baseline  02/07/18: 52/56    Time  8    Period  Weeks    Status  New    Target Date  04/04/18      PT LONG TERM GOAL #2   Title  Pt will improve FGA by at least 4 points in order to demonstrate clinically significant improvement in balance and decreased risk for falls.    Baseline  02/07/18: 19/30    Time  8    Period  Weeks    Status  New    Target Date  04/04/18      PT LONG TERM GOAL #3   Title  Pt will improve ABC by at  least 13% in order to demonstrate clinically significant improvement in balance confidence.     Baseline  02/07/18: 65.63%    Time  8    Period  Weeks  Status  New    Target Date  04/04/18            Plan - 03/14/18 0911    Clinical Impression Statement  Pt presents with unsteadiness on uneven surfaces and fatigues with therapeutic exercises.  Patient tolerated all interventions well this date and will benefit from continued skilled PT interventions to improve strength and balance and decrease risk of falling.    Rehab Potential  Good    PT Frequency  2x / week    PT Duration  8 weeks    PT Treatment/Interventions  ADLs/Self Care Home Management;Aquatic Therapy;Biofeedback;Canalith Repostioning;Cryotherapy;Iontophoresis 4mg /ml Dexamethasone;Electrical Stimulation;Moist Heat;Traction;Ultrasound;DME Instruction;Gait training;Stair training;Functional mobility training;Therapeutic activities;Therapeutic exercise;Balance training;Neuromuscular re-education;Cognitive remediation;Patient/family education;Manual techniques;Dry needling;Energy conservation;Vestibular    PT Next Visit Plan  MOCA?, mCTSIB, progress balance and strengthening program, consider introduction of mindfulness or other stress reduction techniques    PT Home Exercise Plan  Semitandem balance with horiz head turns, full tandem static balance    Consulted and Agree with Plan of Care  Patient       Patient will benefit from skilled therapeutic intervention in order to improve the following deficits and impairments:  Decreased balance, Difficulty walking  Visit Diagnosis: Unsteadiness on feet  History of falling     Problem List Patient Active Problem List   Diagnosis Date Noted  . Generalized abdominal pain 02/19/2018  . GAD (generalized anxiety disorder) 10/30/2017  . MDD (major depressive disorder), single episode, mild ([place]) 10/30/2017  . Chronic insomnia 10/30/2017  . TMJ tenderness, right 05/04/2017  .  Post-nasal drip 05/04/2017  . Ear ringing sound, right 05/04/2017  . 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 Epidermoid cyst of finger of left hand 07/27/2016  . Osteopenia 11/18/2015  . Chronic constipation 07/20/2015  . Internal hemorrhoid, bleeding 07/20/2015  . 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 Counseling regarding end of life decision making 09/08/2014  . Childhood asthma 08/13/2014  . Gastroesophageal reflux disease without esophagitis 08/13/2014  . Irritable bowel syndrome with constipation 08/13/2014  . Hx of migraines 08/13/2014  . Family history of colon cancer 08/13/2014  . Family history of uterine cancer 08/13/2014  . Family history of breast cancer 08/13/2014    Alanson Puls , PT DPT 03/14/2018, 9:16 AM  [place] MAIN [facility] SERVICES [place], [place], [address] Phone: [phone]   Fax:  [phone]  Name: SAYGE BRIENZA MRN: [id number] Date of Birth: Feb 04, 1941

## 2018-03-19 ENCOUNTER — Encounter: Payer: Self-pay | Admitting: Physical Therapy

## 2018-03-19 ENCOUNTER — Ambulatory Visit: Payer: PPO | Attending: Family Medicine | Admitting: Physical Therapy

## 2018-03-19 DIAGNOSIS — Z9181 History of falling: Secondary | ICD-10-CM | POA: Insufficient documentation

## 2018-03-19 DIAGNOSIS — R2681 Unsteadiness on feet: Secondary | ICD-10-CM | POA: Diagnosis not present

## 2018-03-19 NOTE — Therapy (Signed)
[place] MAIN [facility] SERVICES [address] [place], [place], [address] Phone: [phone]   Fax:  [phone]  Physical Therapy Treatment .Physical Therapy Progress Note   Dates of reporting period  02/07/18  to   03/19/18  Patient Details  Name: Jessica Hardin MRN: [id number] Date of Birth: May 28, 1941 Referring Provider (PT): Dr. Diona Browner   Encounter Date: 03/19/2018  PT End of Session - 03/19/18 0850    Visit Number  10    Number of Visits  17    Date for PT Re-Evaluation  04/04/18    Authorization Type  Progress note: 10/10    Equipment Utilized During Treatment  Gait belt    Activity Tolerance  Patient tolerated treatment well    Behavior During Therapy  [facility] for tasks assessed/performed       Past Medical History:  Diagnosis Date  . Allergy   . Asthma   . GERD (gastroesophageal reflux disease)   . History of chicken pox   . IBS (irritable bowel syndrome)   . Migraine   . Urinary tract infection     Past Surgical History:  Procedure Laterality Date  . ABDOMINAL HYSTERECTOMY    . CATARACT EXTRACTION, BILATERAL    . TUBAL LIGATION      There were no vitals filed for this visit.  Subjective Assessment - 03/19/18 0849    Subjective  Patient reports doing well; reports no pain currently; Patient denies any new falls; Reports adherence to HEP. Patient is feeling a little less anxious.    Pertinent History  Pt reports that she is having trouble with her balance. She states that it started approximately one year ago without any known trigger and has been progressive since that time. She has the most difficulty with head turns while walking. She denies dizziness or vertigo. She believes that her symptoms are related to her anxiety. She reports recent visit to the ER secondary to anxiety and GI upset. She had an abdominal CT which was negative for acute findings. She reports recent problems with her memory as well. She saw [facility] who reports that her formal neurocognitive testing did not show dementia but implicated stress, depression, and anxiety. Per neurology note brain MRI in 2019 was normal for her age. She states that her PCP started her on Zoloft approximately 6 months ago. She reports that she took one month and then never refilled the medication. She recently restarted her Zoloft one week ago. She has not disclosed that she stopped her Zoloft to her PCP. She does have a follow-up appt scheduled 02/19/18. ROS negative for red flags (bowel/bladder changes, saddle paresthesia, personal history of cancer, chills/fever, night sweats, unrelenting pain, weight gain/loss). Pt does report decreased appetite recently due to anxiety. She also reports poor sleep stating she only sleeps approximately 4 hours/night.     Limitations  Walking    Diagnostic tests  Abd CT: no acute changes, Brain MRI: WNL for age    Patient Stated Goals  Improve her balance    Currently in Pain?  No/denies    Pain Score  0-No pain    Pain Onset  Yesterday       Treatment: Goal were assessed with Berg, FGA and ABC confidence scale with improvements in all.   TM walking backwards . 4 miles / hour with UE support and better control, no UE support and shorter faster steps   Standing on airex beam:  Tandem stance on airex  beam without rail assist 10 sec hold x4 each foot in front with CGA for safety and cues to improve upper trunk control for better balance control; Side stepping down airex beam without rail assist x3 laps each direction with cues to keep feet on beam and avoid stepping off; Standing with feet apart, BUE ball toss x10 unsupported with close supervision; Patient exhibits posterior loss of balance requiring cues for forward weight shift                     PT Education - 03/19/18 0849    Education Details  HEP,balance, safety    Person(s) Educated  Patient    Methods  Explanation;Tactile cues;Verbal cues     Comprehension  Verbalized understanding;Returned demonstration;Need further instruction       PT Short Term Goals - 02/07/18 0920      PT SHORT TERM GOAL #1   Title  Pt will be independent with HEP in order to improve strength and balance in order to decrease fall risk and improve function at home and work.    Time  4    Period  Weeks    Status  New    Target Date  03/07/18        PT Long Term Goals - 03/19/18 0851      PT LONG TERM GOAL #1   Title  Pt will improve BERG by at least 3 points in order to demonstrate clinically significant improvement in balance.      Baseline  02/07/18: 52/56, 03/19/18= 56/56    Time  8    Period  Weeks    Status  Achieved    Target Date  04/04/18      PT LONG TERM GOAL #2   Title  Pt will improve FGA by at least 4 points in order to demonstrate clinically significant improvement in balance and decreased risk for falls.    Baseline  02/07/18: 19/30, 03/19/18= 26/30    Time  8    Period  Weeks    Status  Partially Met    Target Date  04/04/18      PT LONG TERM GOAL #3   Title  Pt will improve ABC by at least 13% in order to demonstrate clinically significant improvement in balance confidence.     Baseline  02/07/18: 65.63%. 03/19/18=73.75%    Time  8    Period  Weeks    Status  Partially Met    Target Date  04/04/18            Plan - 03/19/18 0911    Clinical Impression Statement Patient's condition has the potential to improve in response to therapy. Maximum improvement is yet to be obtained. The anticipated improvement is attainable and reasonable in a generally predictable time.  Patient reports feeling more confident with ambulation.Patient performs outcome measures with progress made in all goals.  Instructed patient in balance and strengthening exercise. Patient requires CGA to min A with advanced balance exercise. Patient requires cues for weight shift and trunk control for better balance. Patient also instructed to slow down LE  movement during strengthening exercise for better motor control. Patient reports increased fatigue at end of treatment session. Patient would benefit from additional skilled PT intervention to improve balance/gait safety and reduce fall risk.    Rehab Potential  Good    PT Frequency  2x / week    PT Duration  8 weeks    PT Treatment/Interventions  ADLs/Self Care Home Management;Aquatic Therapy;Biofeedback;Canalith Repostioning;Cryotherapy;Iontophoresis '4mg'$ /ml Dexamethasone;Electrical Stimulation;Moist Heat;Traction;Ultrasound;DME Instruction;Gait training;Stair training;Functional mobility training;Therapeutic activities;Therapeutic exercise;Balance training;Neuromuscular re-education;Cognitive remediation;Patient/family education;Manual techniques;Dry needling;Energy conservation;Vestibular    PT Next Visit Plan  MOCA?, mCTSIB, progress balance and strengthening program, consider introduction of mindfulness or other stress reduction techniques    PT Home Exercise Plan  Semitandem balance with horiz head turns, full tandem static balance    Consulted and Agree with Plan of Care  Patient       Patient will benefit from skilled therapeutic intervention in order to improve the following deficits and impairments:  Decreased balance, Difficulty walking  Visit Diagnosis: Unsteadiness on feet  History of falling     Problem List Patient Active Problem List   Diagnosis Date Noted  . Generalized abdominal pain 02/19/2018  . GAD (generalized anxiety disorder) 10/30/2017  . MDD (major depressive disorder), single episode, mild ([place]) 10/30/2017  . Chronic insomnia 10/30/2017  . TMJ tenderness, right 05/04/2017  . Post-nasal drip 05/04/2017  . Ear ringing sound, right 05/04/2017  . 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 Epidermoid cyst of finger of left hand 07/27/2016  . Osteopenia 11/18/2015  . Chronic constipation 07/20/2015  . Internal hemorrhoid, bleeding 07/20/2015  . Memory loss,  mild 02/09/2015  . Counseling regarding end of life decision making 09/08/2014  . Childhood asthma 08/13/2014  . Gastroesophageal reflux disease without esophagitis 08/13/2014  . Irritable bowel syndrome with constipation 08/13/2014  . Hx of migraines 08/13/2014  . Family history of colon cancer 08/13/2014  . Family history of uterine cancer 08/13/2014  . Family history of breast cancer 08/13/2014    Alanson Puls, PT DPT 03/19/2018, 9:17 AM  [place] MAIN [facility] SERVICES [address] [place], [place], [address] Phone: [phone]   Fax:  [phone]  Name: BRISEIDA GITTINGS MRN: [id number] Date of Birth: 03-19-1941

## 2018-03-21 ENCOUNTER — Ambulatory Visit: Payer: PPO | Admitting: Physical Therapy

## 2018-03-21 ENCOUNTER — Encounter: Payer: Self-pay | Admitting: Physical Therapy

## 2018-03-21 DIAGNOSIS — Z9181 History of falling: Secondary | ICD-10-CM

## 2018-03-21 DIAGNOSIS — R2681 Unsteadiness on feet: Secondary | ICD-10-CM | POA: Diagnosis not present

## 2018-03-21 NOTE — Therapy (Signed)
[place] MAIN [facility] SERVICES [address] [place], [place], [address] Phone: [phone]   Fax:  [phone]  Physical Therapy Treatment  Patient Details  Name: Jessica Hardin MRN: [id number] Date of Birth: 02-08-1941 Referring Provider (PT): Dr. Diona Browner   Encounter Date: 03/21/2018  PT End of Session - 03/21/18 0854    Visit Number  11    Number of Visits  17    Date for PT Re-Evaluation  04/04/18    Authorization Type  Progress note: 1/10    PT Start Time  0847    PT Stop Time  0930    PT Time Calculation (min)  43 min    Equipment Utilized During Treatment  Gait belt    Activity Tolerance  Patient tolerated treatment well    Behavior During Therapy  [facility] for tasks assessed/performed       Past Medical History:  Diagnosis Date  . Allergy   . Asthma   . GERD (gastroesophageal reflux disease)   . History of chicken pox   . IBS (irritable bowel syndrome)   . Migraine   . Urinary tract infection     Past Surgical History:  Procedure Laterality Date  . ABDOMINAL HYSTERECTOMY    . CATARACT EXTRACTION, BILATERAL    . TUBAL LIGATION      There were no vitals filed for this visit.  Subjective Assessment - 03/21/18 0853    Subjective  Patient reports doing well; reports no pain currently; Patient denies any new falls; Reports adherence to HEP. Patient is feeling a little less anxious.    Pertinent History  Pt reports that she is having trouble with her balance. She states that it started approximately one year ago without any known trigger and has been progressive since that time. She has the most difficulty with head turns while walking. She denies dizziness or vertigo. She believes that her symptoms are related to her anxiety. She reports recent visit to the ER secondary to anxiety and GI upset. She had an abdominal CT which was negative for acute findings. She reports recent problems with her memory as well. She saw [facility]  Neurology who reports that her formal neurocognitive testing did not show dementia but implicated stress, depression, and anxiety. Per neurology note brain MRI in 2019 was normal for her age. She states that her PCP started her on Zoloft approximately 6 months ago. She reports that she took one month and then never refilled the medication. She recently restarted her Zoloft one week ago. She has not disclosed that she stopped her Zoloft to her PCP. She does have a follow-up appt scheduled 02/19/18. ROS negative for red flags (bowel/bladder changes, saddle paresthesia, personal history of cancer, chills/fever, night sweats, unrelenting pain, weight gain/loss). Pt does report decreased appetite recently due to anxiety. She also reports poor sleep stating she only sleeps approximately 4 hours/night.     Limitations  Walking    Diagnostic tests  Abd CT: no acute changes, Brain MRI: WNL for age    Patient Stated Goals  Improve her balance    Currently in Pain?  No/denies    Pain Score  0-No pain    Pain Onset  Yesterday        Treatment: TM backwards amb . 4 miles / hour x 5 mins No UE support  Airex pad Hold ball and trunk rotation x2 min, CGA for safety, demonstrated difficulty with trunk rotation and keeping arms extended  Airex  pad, balloon tapping to mirrorx2 min, supervision for safety with varying directions and speed of balloon, VCs for utilizing both hands and minimizing UE support  Agility ladder, stepping in and out of squares with fast pace and CGA  Step over hurdle fwd/ bwd, side to side , CGA for safety, VCs to take big enough steps and to try to increase speed to work on coordination  Side stepping x10 on blue balance CGA for safety, VCs for taking a big enough step   airex beam::Tandem stance on airex beam without rail assist 10 sec hold x4 each foot in front with CGA for safety and cues to improve upper trunk control for better balance control  Side stepping down airex  beam without rail assist x3 laps each direction with cues to keep feet on beam and avoid stepping off;  Standing with feet apart, BUE ball toss x10 unsupportedwith close supervision; Patient exhibits posterior loss of balance requiring cues for forward weight shift;  Standing on 1/2 bolster (flat side up):Feet flat, apart, BUE wand flexion x10 reps with min A For safety; Patient able to keep balance well with minimal posterior loss of balance  stepping over hurdle x 10 fwd / bwd, 10 side to side with UE support  Leg press x 100 lbs x 20 x 2  Purple foam and lunge to BOSU ball x 10 x 2 BLE  Patient required min-moderate verbal/tactile cues for correct exercise techniquewith cues for tband placement and to increase ROM for better strengthening;                         PT Education - 03/21/18 0854    Education Details  HEP, safety    Person(s) Educated  Patient    Methods  Explanation;Demonstration    Comprehension  Verbalized understanding;Returned demonstration;Need further instruction       PT Short Term Goals - 02/07/18 0920      PT SHORT TERM GOAL #1   Title  Pt will be independent with HEP in order to improve strength and balance in order to decrease fall risk and improve function at home and work.    Time  4    Period  Weeks    Status  New    Target Date  03/07/18        PT Long Term Goals - 03/19/18 0851      PT LONG TERM GOAL #1   Title  Pt will improve BERG by at least 3 points in order to demonstrate clinically significant improvement in balance.      Baseline  02/07/18: 52/56, 03/19/18= 56/56    Time  8    Period  Weeks    Status  Achieved    Target Date  04/04/18      PT LONG TERM GOAL #2   Title  Pt will improve FGA by at least 4 points in order to demonstrate clinically significant improvement in balance and decreased risk for falls.    Baseline  02/07/18: 19/30, 03/19/18= 26/30    Time  8    Period  Weeks    Status  Partially  Met    Target Date  04/04/18      PT LONG TERM GOAL #3   Title  Pt will improve ABC by at least 13% in order to demonstrate clinically significant improvement in balance confidence.     Baseline  02/07/18: 65.63%. 03/19/18=73.75%    Time  8  Period  Weeks    Status  Partially Met    Target Date  04/04/18            Plan - 03/21/18 0856    Clinical Impression Statement  Pt requires direction and verbal cues for correct performance of exercises. Patient demonstrates weakness in BLE and performs open and closed chain exercises with minimal reports of pain to left knee. Pt was able to perform all exercises with max assist and VC for technique. Patient struggles with speed during movement as well as balance with unstable surfaces.  Pt encouraged continuing new supine HEP .Follow-up as scheduled.    Rehab Potential  Good    PT Frequency  2x / week    PT Duration  8 weeks    PT Treatment/Interventions  ADLs/Self Care Home Management;Aquatic Therapy;Biofeedback;Canalith Repostioning;Cryotherapy;Iontophoresis 14m/ml Dexamethasone;Electrical Stimulation;Moist Heat;Traction;Ultrasound;DME Instruction;Gait training;Stair training;Functional mobility training;Therapeutic activities;Therapeutic exercise;Balance training;Neuromuscular re-education;Cognitive remediation;Patient/family education;Manual techniques;Dry needling;Energy conservation;Vestibular    PT Next Visit Plan  MOCA?, mCTSIB, progress balance and strengthening program, consider introduction of mindfulness or other stress reduction techniques    PT Home Exercise Plan  Semitandem balance with horiz head turns, full tandem static balance    Consulted and Agree with Plan of Care  Patient       Patient will benefit from skilled therapeutic intervention in order to improve the following deficits and impairments:  Decreased balance, Difficulty walking  Visit Diagnosis: Unsteadiness on feet  History of falling     Problem  List Patient Active Problem List   Diagnosis Date Noted  . Generalized abdominal pain 02/19/2018  . GAD (generalized anxiety disorder) 10/30/2017  . MDD (major depressive disorder), single episode, mild ([place] 10/30/2017  . Chronic insomnia 10/30/2017  . TMJ tenderness, right 05/04/2017  . Post-nasal drip 05/04/2017  . Ear ringing sound, right 05/04/2017  . 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 Epidermoid cyst of finger of left hand 07/27/2016  . Osteopenia 11/18/2015  . Chronic constipation 07/20/2015  . Internal hemorrhoid, bleeding 07/20/2015  . 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 Counseling regarding end of life decision making 09/08/2014  . Childhood asthma 08/13/2014  . Gastroesophageal reflux disease without esophagitis 08/13/2014  . Irritable bowel syndrome with constipation 08/13/2014  . Hx of migraines 08/13/2014  . Family history of colon cancer 08/13/2014  . Family history of uterine cancer 08/13/2014  . Family history of breast cancer 08/13/2014    MAlanson Puls, PT DPT 03/21/2018, 8:59 AM  [place] [facility] [address] [address]Phone: [phone]  Fax:  [phone] Name: Jessica DOEBLERMRN: [id number] of Birth: [birth year]

## 2018-03-25 ENCOUNTER — Telehealth: Payer: Self-pay

## 2018-03-25 NOTE — Telephone Encounter (Signed)
Copied from [place] [id number]. Topic: Quick Communication - Rx Refill/Question >> Mar 25, 2018  3:28 PM Scherrie Gerlach wrote: Medication: escitalopram (LEXAPRO) 10 MG tablet  Pt states she left this Rx in the car since 03/01/18.  (also the zoloft was in there) Pt wants to know if you think it is safe for her to take since it was left in the hot car all that time. Please advise. Pt states she is starting to get anxious again and thinks she needs to take something.

## 2018-03-26 ENCOUNTER — Encounter: Payer: Self-pay | Admitting: Occupational Therapy

## 2018-03-26 ENCOUNTER — Ambulatory Visit: Payer: Self-pay | Admitting: Family Medicine

## 2018-03-26 ENCOUNTER — Ambulatory Visit: Payer: PPO

## 2018-03-26 DIAGNOSIS — R2681 Unsteadiness on feet: Secondary | ICD-10-CM

## 2018-03-26 DIAGNOSIS — Z9181 History of falling: Secondary | ICD-10-CM

## 2018-03-26 MED ORDER — MIRTAZAPINE 15 MG PO TABS: 15.0000 mg | ORAL_TABLET | Freq: Every day | ORAL | 5 refills | Status: DC

## 2018-03-26 NOTE — Addendum Note (Signed)
Addended by: Eliezer Lofts E on: 03/26/2018 06:43 PM   Modules accepted: Orders

## 2018-03-26 NOTE — Telephone Encounter (Signed)
Jessica Hardin notified as instructed by telephone.  She is agreeable to trying the Remeron.  Please send Rx to [facility] [address].

## 2018-03-26 NOTE — Telephone Encounter (Signed)
Pt calling back today.  Pt not sure if she wants to even take the escitalopram due to the things she read about this med. But wants to know if safe to take this as well since it has been in the car.  Pt wants to start something, but the zoloft gave her a stomach ache, that is why this med was switched. Pt would like a call back asap.   Pt states she was doing so good, but her Sx have returned.

## 2018-03-26 NOTE — Therapy (Signed)
[place] MAIN [facility] SERVICES [address] [place], [place], [address] Phone: [phone]   Fax:  [phone]  Physical Therapy Treatment  Patient Details  Name: Jessica Hardin MRN: [id number] Date of Birth: [birth year] Referring Provider (PT): Dr. Diona Browner   Encounter Date: 03/26/2018  PT End of Session - 03/26/18 0944    Visit Number  12    Number of Visits  17    Date for PT Re-Evaluation  04/04/18    Authorization Type  Progress note: 2/10    PT Start Time  0945    PT Stop Time  1025    PT Time Calculation (min)  40 min    Equipment Utilized During Treatment  Gait belt    Activity Tolerance  Patient tolerated treatment well    Behavior During Therapy  [facility] for tasks assessed/performed       Past Medical History:  Diagnosis Date  . Allergy   . Asthma   . GERD (gastroesophageal reflux disease)   . History of chicken pox   . IBS (irritable bowel syndrome)   . Migraine   . Urinary tract infection     Past Surgical History:  Procedure Laterality Date  . ABDOMINAL HYSTERECTOMY    . CATARACT EXTRACTION, BILATERAL    . TUBAL LIGATION      There were no vitals filed for this visit.  Subjective Assessment - 03/26/18 0944    Subjective  Patient reports doing well. Denies pain currently. Reports adherence to HEP. Patient is still struggling with anxiety and has contacted her PCP.     Pertinent History  Pt reports that she is having trouble with her balance. She states that it started approximately one year ago without any known trigger and has been progressive since that time. She has the most difficulty with head turns while walking. She denies dizziness or vertigo. She believes that her symptoms are related to her anxiety. She reports recent visit to the ER secondary to anxiety and GI upset. She had an abdominal CT which was negative for acute findings. She reports recent problems with her memory as well. She saw [facility] Neurology who  reports that her formal neurocognitive testing did not show dementia but implicated stress, depression, and anxiety. Per neurology note brain MRI in 2019 was normal for her age. She states that her PCP started her on Zoloft approximately 6 months ago. She reports that she took one month and then never refilled the medication. She recently restarted her Zoloft one week ago. She has not disclosed that she stopped her Zoloft to her PCP. She does have a follow-up appt scheduled 02/19/18. ROS negative for red flags (bowel/bladder changes, saddle paresthesia, personal history of cancer, chills/fever, night sweats, unrelenting pain, weight gain/loss). Pt does report decreased appetite recently due to anxiety. She also reports poor sleep stating she only sleeps approximately 4 hours/night.     Limitations  Walking    Diagnostic tests  Abd CT: no acute changes, Brain MRI: WNL for age    Patient Stated Goals  Improve her balance    Currently in Pain?  No/denies          TREATMENT   Ther-ex  NuStep L2 x 4 minutes for warm-up during history (2 minutes unbilled); Quantum leg press 120# 2 x 20; Standing mini squats 2 x 15; Standing heel raises 2 x 15 Step-ups to 6" step alternating LE x 15 on each side;   Neuromuscular Re-education  Standing  on Airex toe taps to 6" step alternating LE x 15 each; Standing on Airex toe taps to 6" step with 7" cone on top alternating LE x 10 each; Tandem stance balance on 1/2 bolster (flat side up) alternating forward LE 30s x 2 each; Standing on 1/2 bolster with ball pass around trunk with therapist varying height from waist to overhead; Ambulation in hallway with vertical ball toss to self 75' x 2; Ambulation in hallway with horizontal ball toss to therapist x 75' each direction, no dizziness or unsteadiness with head turns; Airex pad balance with feet together and eyes closed 30s x 2;     Pt educated throughout session about proper posture and technique with  exercises. Improved exercise technique, movement at target joints, use of target muscles after min to mod verbal, visual, tactile cues.     Pt demonstrates excellent progress with her balance and strength since starting with therapy. She has progressed to advanced balance exercises. She reports continued difficulty with anxiety and has contacted her PCP regarding this issue. Her balance is improving with dynamic head turns during ambulation with ball tosses. Pt will benefit from PT services to address deficits in strength, balance, and mobility in order to return to full function at home.                      PT Short Term Goals - 02/07/18 0920      PT SHORT TERM GOAL #1   Title  Pt will be independent with HEP in order to improve strength and balance in order to decrease fall risk and improve function at home and work.    Time  4    Period  Weeks    Status  New    Target Date  03/07/18        PT Long Term Goals - 03/19/18 0851      PT LONG TERM GOAL #1   Title  Pt will improve BERG by at least 3 points in order to demonstrate clinically significant improvement in balance.      Baseline  02/07/18: 52/56, 03/19/18= 56/56    Time  8    Period  Weeks    Status  Achieved    Target Date  04/04/18      PT LONG TERM GOAL #2   Title  Pt will improve FGA by at least 4 points in order to demonstrate clinically significant improvement in balance and decreased risk for falls.    Baseline  02/07/18: 19/30, 03/19/18= 26/30    Time  8    Period  Weeks    Status  Partially Met    Target Date  04/04/18      PT LONG TERM GOAL #3   Title  Pt will improve ABC by at least 13% in order to demonstrate clinically significant improvement in balance confidence.     Baseline  02/07/18: 65.63%. 03/19/18=73.75%    Time  8    Period  Weeks    Status  Partially Met    Target Date  04/04/18            Plan - 03/26/18 0948    Clinical Impression Statement  Pt demonstrates excellent  progress with her balance and strength since starting with therapy. She has progressed to advanced balance exercises. She reports continued difficulty with anxiety and has contacted her PCP regarding this issue. Her balance is improving with dynamic head turns during ambulation with ball tosses.  Pt will benefit from PT services to address deficits in strength, balance, and mobility in order to return to full function at home.     Rehab Potential  Good    PT Frequency  2x / week    PT Duration  8 weeks    PT Treatment/Interventions  ADLs/Self Care Home Management;Aquatic Therapy;Biofeedback;Canalith Repostioning;Cryotherapy;Iontophoresis '4mg'$ /ml Dexamethasone;Electrical Stimulation;Moist Heat;Traction;Ultrasound;DME Instruction;Gait training;Stair training;Functional mobility training;Therapeutic activities;Therapeutic exercise;Balance training;Neuromuscular re-education;Cognitive remediation;Patient/family education;Manual techniques;Dry needling;Energy conservation;Vestibular    PT Next Visit Plan  MOCA?, mCTSIB, progress balance and strengthening program, consider introduction of mindfulness or other stress reduction techniques    PT Home Exercise Plan  Semitandem balance with horiz head turns, full tandem static balance    Consulted and Agree with Plan of Care  Patient       Patient will benefit from skilled therapeutic intervention in order to improve the following deficits and impairments:  Decreased balance, Difficulty walking  Visit Diagnosis: Unsteadiness on feet  History of falling     Problem List Patient Active Problem List   Diagnosis Date Noted  . Generalized abdominal pain 02/19/2018  . GAD (generalized anxiety disorder) 10/30/2017  . MDD (major depressive disorder), single episode, mild ([place]) 10/30/2017  . Chronic insomnia 10/30/2017  . TMJ tenderness, right 05/04/2017  . Post-nasal drip 05/04/2017  . Ear ringing sound, right 05/04/2017  . Allergic rhinoconjunctivitis  10/05/2016  . Epidermoid cyst of finger of left hand 07/27/2016  . Osteopenia 11/18/2015  . Chronic constipation 07/20/2015  . Internal hemorrhoid, bleeding 07/20/2015  . 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 Counseling regarding end of life decision making 09/08/2014  . Childhood asthma 08/13/2014  . Gastroesophageal reflux disease without esophagitis 08/13/2014  . Irritable bowel syndrome with constipation 08/13/2014  . Hx of migraines 08/13/2014  . Family history of colon cancer 08/13/2014  . Family history of uterine cancer 08/13/2014  . Family history of breast cancer 08/13/2014   Phillips Grout PT, DPT, GCS  Huprich,Jason 03/26/2018, 1:43 PM  [place] MAIN [facility] SERVICES [address] [place], [place], [address] Phone: [phone]   Fax:  [phone]  Name: ISLAY POLANCO MRN: [id number] Date of Birth: [birth year]

## 2018-03-26 NOTE — Telephone Encounter (Signed)
If symptoms have returned off medication... I agree with restarting.  I cannot guarantee safety of medication after being in heat in car. Discard med.  Given her concerns with lexapro.. I would recommend instead remeron a different med low dose at night to help with mood, anxiety, appetite, sleep issues etc. We discussed this med at last OV. Let me know if she is agreeable.

## 2018-03-27 ENCOUNTER — Ambulatory Visit: Payer: Self-pay | Admitting: *Deleted

## 2018-03-27 DIAGNOSIS — F330 Major depressive disorder, recurrent, mild: Secondary | ICD-10-CM | POA: Diagnosis not present

## 2018-03-27 NOTE — Telephone Encounter (Signed)
She has been scheduled to see Dr. Lorelei Pont on Thursday 03/28/18 at 10:40 am.

## 2018-03-27 NOTE — Telephone Encounter (Signed)
Pt called with feeling dizzy, nausea, teeth chattering and having a low feeling in her stomach. This low feeling she states is feeling anxiety. She had been on sertraline and stopped taking it about 2 weeks ago. She was prescribed lexapro and never took it because of possible side effects and had left her medication in the car for 2 weeks. So she never took that.  She now has a new medication, Remeron that she just picked up from the pharmacy and not started yet.  Flow at [facility] at [facility] notified regarding advise and contact her pcp.  PCP not in office, pt advised to go to the closest urgent care. Pt voiced understanding. Advised to call back for any concerns.  Reason for Disposition . Caller has URGENT medication question about med that PCP prescribed and triager unable to answer question  Answer Assessment - Initial Assessment Questions 1. SYMPTOMS: "Do you have any symptoms?"     Dizziness, nausea, chattering teeth, low feeling in stomach 2. SEVERITY: If symptoms are present, ask "Are they mild, moderate or severe?"     Moderate to severe  Protocols used: MEDICATION QUESTION CALL-A-AH

## 2018-03-28 ENCOUNTER — Ambulatory Visit: Payer: PPO | Admitting: Physical Therapy

## 2018-03-28 ENCOUNTER — Ambulatory Visit: Payer: Self-pay | Admitting: Family Medicine

## 2018-03-28 ENCOUNTER — Encounter: Payer: Self-pay | Admitting: Physical Therapy

## 2018-03-28 DIAGNOSIS — R2681 Unsteadiness on feet: Secondary | ICD-10-CM

## 2018-03-28 DIAGNOSIS — Z9181 History of falling: Secondary | ICD-10-CM

## 2018-03-28 NOTE — Telephone Encounter (Signed)
Patient has now been rescheduled to see Dr. Diona Browner on Friday 03/29/18 per Dr. Lillie Fragmin request.

## 2018-03-28 NOTE — Telephone Encounter (Signed)
Have pt start remeron at bedtime as already instructed.. These are her typical symptoms of anxiety. Needs to given remeron a few days to start working.Marland Kitchen Reschedule  appt for next week.

## 2018-03-28 NOTE — Therapy (Signed)
[place] MAIN [facility] SERVICES [address] [place], [place], [address] Phone: [phone]   Fax:  [phone]  Physical Therapy Treatment  Patient Details  Name: Jessica Hardin MRN: [id number] Date of Birth: 08-Aug-1940 Referring Provider (PT): Dr. Diona Browner   Encounter Date: 03/28/2018  PT End of Session - 03/28/18 0946    Visit Number  13    Number of Visits  17    Date for PT Re-Evaluation  04/04/18    Authorization Type  Progress note: 3/10    PT Start Time  0940    PT Stop Time  1020    PT Time Calculation (min)  40 min    Equipment Utilized During Treatment  Gait belt    Activity Tolerance  Patient tolerated treatment well    Behavior During Therapy  [facility] for tasks assessed/performed       Past Medical History:  Diagnosis Date  . Allergy   . Asthma   . GERD (gastroesophageal reflux disease)   . History of chicken pox   . IBS (irritable bowel syndrome)   . Migraine   . Urinary tract infection     Past Surgical History:  Procedure Laterality Date  . ABDOMINAL HYSTERECTOMY    . CATARACT EXTRACTION, BILATERAL    . TUBAL LIGATION      There were no vitals filed for this visit.  Subjective Assessment - 03/28/18 0945    Subjective  Patient reports doing well. Denies pain currently. Reports adherence to HEP. Patient is still struggling with anxiety and has contacted her PCP.     Pertinent History  Pt reports that she is having trouble with her balance. She states that it started approximately one year ago without any known trigger and has been progressive since that time. She has the most difficulty with head turns while walking. She denies dizziness or vertigo. She believes that her symptoms are related to her anxiety. She reports recent visit to the ER secondary to anxiety and GI upset. She had an abdominal CT which was negative for acute findings. She reports recent problems with her memory as well. She saw [facility] who  reports that her formal neurocognitive testing did not show dementia but implicated stress, depression, and anxiety. Per neurology note brain MRI in 2019 was normal for her age. She states that her PCP started her on Zoloft approximately 6 months ago. She reports that she took one month and then never refilled the medication. She recently restarted her Zoloft one week ago. She has not disclosed that she stopped her Zoloft to her PCP. She does have a follow-up appt scheduled 02/19/18. ROS negative for red flags (bowel/bladder changes, saddle paresthesia, personal history of cancer, chills/fever, night sweats, unrelenting pain, weight gain/loss). Pt does report decreased appetite recently due to anxiety. She also reports poor sleep stating she only sleeps approximately 4 hours/night.     Limitations  Walking    Diagnostic tests  Abd CT: no acute changes, Brain MRI: WNL for age    Patient Stated Goals  Improve her balance    Currently in Pain?  No/denies    Pain Score  0-No pain    Pain Onset  Yesterday       Treatment:  Airex pad Hold ball and trunk rotation x2 min, CGA for safety, demonstrated difficulty with trunk rotation and keeping arms extended  Step over hurdle fwd/ bwd, side to side , CGA for safety, VCs to take big enough  steps and to try to increase speed to work on coordination  Tandem stance on airex beam without rail assist 10 sec hold x4 each foot in front with CGA for safety and cues to improve upper trunk control for better balance control  Side stepping down airex beam without rail assist x3 laps each direction with cues to keep feet on beam and avoid stepping off;  Standing with feet apart, BUE ball toss x10 unsupportedwith close supervision; Patient exhibits posterior loss of balance requiring cues for forward weight shift;  Standing on 1/2 bolster (flat side up):Feet flat, apart, BUE wand flexion x10 reps with min A For safety; Patient able to keep balance well with  minimal posterior loss of balance  stepping over hurdle x 10 fwd / bwd, 10 side to side with UE support  Leg press x 100 lbs x 20 x 2  Purple foam and lunge to BOSU ball x 10 x 2 BLE  Patient required min-moderate verbal/tactile cues for correct exercise techniquewith cues for tband placement and to increase ROM for better strengthening;                        PT Education - 03/28/18 0945    Education Details  HEP    Person(s) Educated  Patient    Methods  Explanation    Comprehension  Verbalized understanding;Returned demonstration;Need further instruction       PT Short Term Goals - 02/07/18 0920      PT SHORT TERM GOAL #1   Title  Pt will be independent with HEP in order to improve strength and balance in order to decrease fall risk and improve function at home and work.    Time  4    Period  Weeks    Status  New    Target Date  03/07/18        PT Long Term Goals - 03/19/18 0851      PT LONG TERM GOAL #1   Title  Pt will improve BERG by at least 3 points in order to demonstrate clinically significant improvement in balance.      Baseline  02/07/18: 52/56, 03/19/18= 56/56    Time  8    Period  Weeks    Status  Achieved    Target Date  04/04/18      PT LONG TERM GOAL #2   Title  Pt will improve FGA by at least 4 points in order to demonstrate clinically significant improvement in balance and decreased risk for falls.    Baseline  02/07/18: 19/30, 03/19/18= 26/30    Time  8    Period  Weeks    Status  Partially Met    Target Date  04/04/18      PT LONG TERM GOAL #3   Title  Pt will improve ABC by at least 13% in order to demonstrate clinically significant improvement in balance confidence.     Baseline  02/07/18: 65.63%. 03/19/18=73.75%    Time  8    Period  Weeks    Status  Partially Met    Target Date  04/04/18            Plan - 03/28/18 0949    Clinical Impression Statement  Pt requires direction and verbal cues for correct  performance of exercises. Patient demonstrates weakness in BLE and performs open and closed chain exercises with minimal reports of pain to left knee. Pt was able to perform  all exercises with max assist and VC for technique. Patient struggles with speed during movement as well as balance with unstable surfaces. Pt encouraged continuing new supine HEP .Follow-up as scheduled    Rehab Potential  Good    PT Frequency  2x / week    PT Duration  8 weeks    PT Treatment/Interventions  ADLs/Self Care Home Management;Aquatic Therapy;Biofeedback;Canalith Repostioning;Cryotherapy;Iontophoresis '4mg'$ /ml Dexamethasone;Electrical Stimulation;Moist Heat;Traction;Ultrasound;DME Instruction;Gait training;Stair training;Functional mobility training;Therapeutic activities;Therapeutic exercise;Balance training;Neuromuscular re-education;Cognitive remediation;Patient/family education;Manual techniques;Dry needling;Energy conservation;Vestibular    PT Next Visit Plan  MOCA?, mCTSIB, progress balance and strengthening program, consider introduction of mindfulness or other stress reduction techniques    PT Home Exercise Plan  Semitandem balance with horiz head turns, full tandem static balance    Consulted and Agree with Plan of Care  Patient       Patient will benefit from skilled therapeutic intervention in order to improve the following deficits and impairments:  Decreased balance, Difficulty walking  Visit Diagnosis: Unsteadiness on feet  History of falling     Problem List Patient Active Problem List   Diagnosis Date Noted  . Generalized abdominal pain 02/19/2018  . GAD (generalized anxiety disorder) 10/30/2017  . MDD (major depressive disorder), single episode, mild ([place]) 10/30/2017  . Chronic insomnia 10/30/2017  . TMJ tenderness, right 05/04/2017  . Post-nasal drip 05/04/2017  . Ear ringing sound, right 05/04/2017  . 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 Epidermoid cyst of finger of left hand  07/27/2016  . Osteopenia 11/18/2015  . Chronic constipation 07/20/2015  . Internal hemorrhoid, bleeding 07/20/2015  . 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 Counseling regarding end of life decision making 09/08/2014  . Childhood asthma 08/13/2014  . Gastroesophageal reflux disease without esophagitis 08/13/2014  . Irritable bowel syndrome with constipation 08/13/2014  . Hx of migraines 08/13/2014  . Family history of colon cancer 08/13/2014  . Family history of uterine cancer 08/13/2014  . Family history of breast cancer 08/13/2014    Alanson Puls, PT DPT 03/28/2018, 9:52 AM  [place] MAIN [facility] SERVICES [place], [place], [address] Phone: [phone]   Fax:  [phone]  Name: MONEE DEMBECK MRN: [id number] Date of Birth: [birth year]

## 2018-03-28 NOTE — Telephone Encounter (Signed)
Jessica Hardin notified as instructed by telephone.  She is agreeable to starting the Remeron tonight.  Appointment rescheduled to 04/02/18 at 12:00 pm with Dr. Diona Browner.

## 2018-03-29 ENCOUNTER — Ambulatory Visit: Payer: Self-pay | Admitting: Family Medicine

## 2018-04-02 ENCOUNTER — Ambulatory Visit (INDEPENDENT_AMBULATORY_CARE_PROVIDER_SITE_OTHER): Payer: PPO | Admitting: Family Medicine

## 2018-04-02 ENCOUNTER — Ambulatory Visit: Payer: PPO | Admitting: Physical Therapy

## 2018-04-02 ENCOUNTER — Encounter: Payer: Self-pay | Admitting: Physical Therapy

## 2018-04-02 ENCOUNTER — Encounter: Payer: Self-pay | Admitting: Family Medicine

## 2018-04-02 VITALS — BP 100/60 | HR 75 | Temp 98.5°F | Ht 58.5 in | Wt 126.2 lb

## 2018-04-02 DIAGNOSIS — F411 Generalized anxiety disorder: Secondary | ICD-10-CM

## 2018-04-02 DIAGNOSIS — N6310 Unspecified lump in the right breast, unspecified quadrant: Secondary | ICD-10-CM | POA: Diagnosis not present

## 2018-04-02 DIAGNOSIS — F320 Major depressive disorder, single episode, mild: Secondary | ICD-10-CM | POA: Diagnosis not present

## 2018-04-02 DIAGNOSIS — R2681 Unsteadiness on feet: Secondary | ICD-10-CM | POA: Diagnosis not present

## 2018-04-02 DIAGNOSIS — Z9181 History of falling: Secondary | ICD-10-CM

## 2018-04-02 NOTE — Therapy (Signed)
[place] MAIN [facility] SERVICES [address] [place], [place], [address] Phone: [phone]   Fax:  [phone]  Physical Therapy Treatment  Patient Details  Name: Jessica Hardin MRN: [id number] Date of Birth: Dec 28, 1940 Referring Provider (PT): Dr. Diona Browner   Encounter Date: 04/02/2018  PT End of Session - 04/02/18 0812    Visit Number  14    Number of Visits  17    Date for PT Re-Evaluation  04/04/18    Authorization Type  Progress note: 4/10    PT Start Time  0805    PT Stop Time  0843    PT Time Calculation (min)  38 min    Equipment Utilized During Treatment  Gait belt    Activity Tolerance  Patient tolerated treatment well    Behavior During Therapy  [facility] for tasks assessed/performed       Past Medical History:  Diagnosis Date  . Allergy   . Asthma   . GERD (gastroesophageal reflux disease)   . History of chicken pox   . IBS (irritable bowel syndrome)   . Migraine   . Urinary tract infection     Past Surgical History:  Procedure Laterality Date  . ABDOMINAL HYSTERECTOMY    . CATARACT EXTRACTION, BILATERAL    . TUBAL LIGATION      There were no vitals filed for this visit.  Subjective Assessment - 04/02/18 0811    Subjective  Patient reports that she is losing weight but does not know why.    Pertinent History  Pt reports that she is having trouble with her balance. She states that it started approximately one year ago without any known trigger and has been progressive since that time. She has the most difficulty with head turns while walking. She denies dizziness or vertigo. She believes that her symptoms are related to her anxiety. She reports recent visit to the ER secondary to anxiety and GI upset. She had an abdominal CT which was negative for acute findings. She reports recent problems with her memory as well. She saw [facility] who reports that her formal neurocognitive testing did not show dementia but  implicated stress, depression, and anxiety. Per neurology note brain MRI in 2019 was normal for her age. She states that her PCP started her on Zoloft approximately 6 months ago. She reports that she took one month and then never refilled the medication. She recently restarted her Zoloft one week ago. She has not disclosed that she stopped her Zoloft to her PCP. She does have a follow-up appt scheduled 02/19/18. ROS negative for red flags (bowel/bladder changes, saddle paresthesia, personal history of cancer, chills/fever, night sweats, unrelenting pain, weight gain/loss). Pt does report decreased appetite recently due to anxiety. She also reports poor sleep stating she only sleeps approximately 4 hours/night.     Limitations  Walking    Diagnostic tests  Abd CT: no acute changes, Brain MRI: WNL for age    Patient Stated Goals  Improve her balance    Currently in Pain?  No/denies    Pain Score  0-No pain    Pain Onset  Yesterday         Ther-ex  NuStep L2 x 4 minutes for warm-up during history (2 minutes unbilled); Quantum leg press 120# 2 x 20; Quadriped LE ext  2 x 10 with 5 sec hold Standing heel raises 2 x 15 Step-ups to 6" step alternating LE x 15 on each side Prone  hip ext bilateral with 5 sec hold   Neuromuscular Re-education  Standing on Airex toe taps to 6" step alternating LE x 15 each; Standing on Airex toe taps to 6" step with 7" cone on top alternating LE x 10 each; Tandem stance balance on 1/2 bolster (flat side up) alternating forward LE 30s x 2 each; Standing on 1/2 bolster with ball pass around trunk with therapist varying height from waist to overhead; TM walking backwards x 3 mins . 4 miles/hour TM side stepping . 4 miles/hour x 3 mins left and right     Pt educated throughout session about proper posture and technique with exercises. Improved exercise technique, movement at target joints, use of target muscles after min to mod verbal, visual, tactile cues.                         PT Education - 04/02/18 0811    Education Details  HEP    Person(s) Educated  Patient    Methods  Explanation    Comprehension  Verbalized understanding;Returned demonstration;Need further instruction       PT Short Term Goals - 02/07/18 0920      PT SHORT TERM GOAL #1   Title  Pt will be independent with HEP in order to improve strength and balance in order to decrease fall risk and improve function at home and work.    Time  4    Period  Weeks    Status  New    Target Date  03/07/18        PT Long Term Goals - 03/19/18 0851      PT LONG TERM GOAL #1   Title  Pt will improve BERG by at least 3 points in order to demonstrate clinically significant improvement in balance.      Baseline  02/07/18: 52/56, 03/19/18= 56/56    Time  8    Period  Weeks    Status  Achieved    Target Date  04/04/18      PT LONG TERM GOAL #2   Title  Pt will improve FGA by at least 4 points in order to demonstrate clinically significant improvement in balance and decreased risk for falls.    Baseline  02/07/18: 19/30, 03/19/18= 26/30    Time  8    Period  Weeks    Status  Partially Met    Target Date  04/04/18      PT LONG TERM GOAL #3   Title  Pt will improve ABC by at least 13% in order to demonstrate clinically significant improvement in balance confidence.     Baseline  02/07/18: 65.63%. 03/19/18=73.75%    Time  8    Period  Weeks    Status  Partially Met    Target Date  04/04/18            Plan - 04/02/18 0263    Clinical Impression Statement  Pt responded well to all interventions today.  Focus on core and hip strengthening in order to improve balance and ambulation, and dynamic standing balance interventions. Pt was able to complete core exercises, requiring cueing for proper form and hip alignment during seated and supine core activities. Pt would continue to benefit from skilled therapy services in order to improve LE and core strength, gait  and functional mobility in order to decrease risk of falls and improve independence with mobility.    Rehab Potential  Good  PT Frequency  2x / week    PT Duration  8 weeks    PT Treatment/Interventions  ADLs/Self Care Home Management;Aquatic Therapy;Biofeedback;Canalith Repostioning;Cryotherapy;Iontophoresis '4mg'$ /ml Dexamethasone;Electrical Stimulation;Moist Heat;Traction;Ultrasound;DME Instruction;Gait training;Stair training;Functional mobility training;Therapeutic activities;Therapeutic exercise;Balance training;Neuromuscular re-education;Cognitive remediation;Patient/family education;Manual techniques;Dry needling;Energy conservation;Vestibular    PT Next Visit Plan  MOCA?, mCTSIB, progress balance and strengthening program, consider introduction of mindfulness or other stress reduction techniques    PT Home Exercise Plan  Semitandem balance with horiz head turns, full tandem static balance    Consulted and Agree with Plan of Care  Patient       Patient will benefit from skilled therapeutic intervention in order to improve the following deficits and impairments:  Decreased balance, Difficulty walking  Visit Diagnosis: Unsteadiness on feet  History of falling     Problem List Patient Active Problem List   Diagnosis Date Noted  . Generalized abdominal pain 02/19/2018  . GAD (generalized anxiety disorder) 10/30/2017  . MDD (major depressive disorder), single episode, mild ([place]) 10/30/2017  . Chronic insomnia 10/30/2017  . TMJ tenderness, right 05/04/2017  . Post-nasal drip 05/04/2017  . Ear ringing sound, right 05/04/2017  . 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 Epidermoid cyst of finger of left hand 07/27/2016  . Osteopenia 11/18/2015  . Chronic constipation 07/20/2015  . Internal hemorrhoid, bleeding 07/20/2015  . 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 Counseling regarding end of life decision making 09/08/2014  . Childhood asthma 08/13/2014  . Gastroesophageal reflux  disease without esophagitis 08/13/2014  . Irritable bowel syndrome with constipation 08/13/2014  . Hx of migraines 08/13/2014  . Family history of colon cancer 08/13/2014  . Family history of uterine cancer 08/13/2014  . Family history of breast cancer 08/13/2014    Alanson Puls, PT DPT 04/02/2018, 8:22 AM  [place] MAIN [facility] SERVICES [address] [place], [place], [address] Phone: [phone]   Fax:  [phone]  Name: Jessica Hardin MRN: [id number] Date of Birth: Jul 30, 1940

## 2018-04-02 NOTE — Patient Instructions (Addendum)
Continue current course of Remeron 15 mg. Please stop at the front desk to set up referral.

## 2018-04-02 NOTE — Progress Notes (Signed)
Subjective:    Patient ID: Jessica Hardin, female    DOB: [birth year], 77 y.o.   MRN: [id number]  HPI    77 year old female presents for follow up memory loss, anxiety  and depression.   She is here today mainly given concern over possible SEs of Remeron.  She has not had anyt SE and has started the low dose remeron at bedtime in last  Week.  Nervousness in improved. She is feeling better.  No upset stomche, no jittery. She is sleeping well.. No longer needing  OTC sleep medication... Melatonin.    Wt Readings from Last 3 Encounters:  04/02/18 126 lb 4 oz (57.3 kg)  03/08/18 126 lb (57.2 kg)  02/19/18 127 lb 8 oz (57.8 kg)  Body mass index is 25.94 kg/m.  She noted right breast pain off and on for years.. Tender to touch only , right is slightly larger. Over due for mammogram. Sister with breast cancer. Nml mammo in 2015, refused at [facility] in 2018  Blood pressure 100/60, pulse 75, temperature 98.5 F (36.9 C), temperature source Oral, height 4' 10.5" (1.486 m), weight 126 lb 4 oz (57.3 kg). Social History /Family History/Past Medical History reviewed in detail and updated in EMR if needed.  Review of Systems  Constitutional: Negative for fatigue and fever.  HENT: Negative for congestion.   Eyes: Negative for pain.  Respiratory: Negative for cough and shortness of breath.   Cardiovascular: Positive for chest pain. Negative for palpitations and leg swelling.  Gastrointestinal: Negative for abdominal pain.  Genitourinary: Negative for dysuria and vaginal bleeding.  Musculoskeletal: Negative for back pain.  Neurological: Negative for syncope, light-headedness and headaches.  Psychiatric/Behavioral: Negative for dysphoric mood.       Objective:   Physical Exam  Constitutional: Vital signs are normal. She appears well-developed and well-nourished. She is cooperative.  Non-toxic appearance. She does not appear ill. No distress.  HENT:  Head: Normocephalic.  Right Ear:  Hearing, tympanic membrane, external ear and ear canal normal. Tympanic membrane is not erythematous, not retracted and not bulging.  Left Ear: Hearing, tympanic membrane, external ear and ear canal normal. Tympanic membrane is not erythematous, not retracted and not bulging.  Nose: No mucosal edema or rhinorrhea. Right sinus exhibits no maxillary sinus tenderness and no frontal sinus tenderness. Left sinus exhibits no maxillary sinus tenderness and no frontal sinus tenderness.  Mouth/Throat: Uvula is midline, oropharynx is clear and moist and mucous membranes are normal.  Eyes: Pupils are equal, round, and reactive to light. Conjunctivae, EOM and lids are normal. Lids are everted and swept, no foreign bodies found.  Neck: Trachea normal and normal range of motion. Neck supple. Carotid bruit is not present. No thyroid mass and no thyromegaly present.  Cardiovascular: Normal rate, regular rhythm, S1 normal, S2 normal, normal heart sounds, intact distal pulses and normal pulses. Exam reveals no gallop and no friction rub.  No murmur heard. Pulmonary/Chest: Effort normal and breath sounds normal. No tachypnea. No respiratory distress. She has no decreased breath sounds. She has no wheezes. She has no rhonchi. She has no rales. Right breast exhibits mass and tenderness. Right breast exhibits no inverted nipple, no nipple discharge and no skin change. Left breast exhibits no inverted nipple, no mass, no nipple discharge, no skin change and no tenderness. No breast discharge. Breasts are asymmetrical.  Abdominal: Soft. Normal appearance and bowel sounds are normal. There is no tenderness.  Neurological: She is alert.  Skin: Skin is  warm, dry and intact. No rash noted.  Psychiatric: Her speech is normal and behavior is normal. Judgment and thought content normal. Her mood appears not anxious. Cognition and memory are normal. She does not exhibit a depressed mood.          Assessment & Plan:

## 2018-04-04 ENCOUNTER — Ambulatory Visit: Payer: PPO | Admitting: Physical Therapy

## 2018-04-04 ENCOUNTER — Ambulatory Visit: Payer: Self-pay | Admitting: Family Medicine

## 2018-04-09 ENCOUNTER — Encounter: Payer: Self-pay | Admitting: Physical Therapy

## 2018-04-09 ENCOUNTER — Ambulatory Visit: Payer: PPO | Admitting: Physical Therapy

## 2018-04-09 DIAGNOSIS — R2681 Unsteadiness on feet: Secondary | ICD-10-CM | POA: Diagnosis not present

## 2018-04-09 DIAGNOSIS — Z9181 History of falling: Secondary | ICD-10-CM

## 2018-04-09 NOTE — Therapy (Signed)
[place] MAIN [facility] SERVICES [address] [place], [place], [address] Phone: [phone]   Fax:  [phone]  Physical Therapy Treatment/ Discharge summary  Patient Details  Name: Jessica Hardin MRN: [id number] Date of Birth: 13-Feb-1941 Referring Provider (PT): Dr. Diona Browner   Encounter Date: 04/09/2018  PT End of Session - 04/09/18 1117    Visit Number  15    Number of Visits  17    Date for PT Re-Evaluation  04/04/18    Authorization Type  Progress note: 5/10    PT Start Time  1105    PT Stop Time  1150    PT Time Calculation (min)  45 min    Equipment Utilized During Treatment  Gait belt    Activity Tolerance  Patient tolerated treatment well    Behavior During Therapy  [facility] for tasks assessed/performed       Past Medical History:  Diagnosis Date  . Allergy   . Asthma   . GERD (gastroesophageal reflux disease)   . History of chicken pox   . IBS (irritable bowel syndrome)   . Migraine   . Urinary tract infection     Past Surgical History:  Procedure Laterality Date  . ABDOMINAL HYSTERECTOMY    . CATARACT EXTRACTION, BILATERAL    . TUBAL LIGATION      There were no vitals filed for this visit.  Subjective Assessment - 04/09/18 1111    Subjective  Patient reports that she is losing weight but does not know why.    Pertinent History  Pt reports that she is having trouble with her balance. She states that it started approximately one year ago without any known trigger and has been progressive since that time. She has the most difficulty with head turns while walking. She denies dizziness or vertigo. She believes that her symptoms are related to her anxiety. She reports recent visit to the ER secondary to anxiety and GI upset. She had an abdominal CT which was negative for acute findings. She reports recent problems with her memory as well. She saw [facility] who reports that her formal neurocognitive testing did not show  dementia but implicated stress, depression, and anxiety. Per neurology note brain MRI in 2019 was normal for her age. She states that her PCP started her on Zoloft approximately 6 months ago. She reports that she took one month and then never refilled the medication. She recently restarted her Zoloft one week ago. She has not disclosed that she stopped her Zoloft to her PCP. She does have a follow-up appt scheduled 02/19/18. ROS negative for red flags (bowel/bladder changes, saddle paresthesia, personal history of cancer, chills/fever, night sweats, unrelenting pain, weight gain/loss). Pt does report decreased appetite recently due to anxiety. She also reports poor sleep stating she only sleeps approximately 4 hours/night.     Limitations  Walking    Diagnostic tests  Abd CT: no acute changes, Brain MRI: WNL for age    Patient Stated Goals  Improve her balance    Currently in Pain?  No/denies    Pain Score  0-No pain    Pain Onset  Yesterday           Ther-ex Octane fitness  L2 x 4 minutes for warm-up during history (2 minutes unbilled);  reviewed HEP    Neuromuscular Re-education Standing on Airex toe taps to 6" step alternating LE x 15 each; Standing on Airex toe taps to 6" step with 7"  cone on top alternating LE x 10 each; Tandem stance balanceon 1/2 bolster (flat side up) alternating forward LE 30s x 2 each; Standing on 1/2 bolster with ball pass around trunk with therapist varying height from waist to overhead; TM walking backwards x 3 mins . 4 miles/hour TM side stepping . 4 miles/hour x 3 mins left and right    Pt educated throughout session about proper posture and technique with exercises. Improved exercise technique, movement at target joints, use of target muscles after min to mod verbal, visual, tactile cues.                      PT Education - 04/09/18 1115    Education Details  HEP    Person(s) Educated  Patient    Methods   Explanation;Demonstration    Comprehension  Returned demonstration;Verbalized understanding       PT Short Term Goals - 02/07/18 0920      PT SHORT TERM GOAL #1   Title  Pt will be independent with HEP in order to improve strength and balance in order to decrease fall risk and improve function at home and work.    Time  4    Period  Weeks    Status  New    Target Date  03/07/18        PT Long Term Goals - 04/09/18 1119      PT LONG TERM GOAL #1   Title  Pt will improve BERG by at least 3 points in order to demonstrate clinically significant improvement in balance.      Baseline  02/07/18: 52/56, 03/19/18= 56/56    Time  8    Period  Weeks    Status  Achieved    Target Date  04/09/18      PT LONG TERM GOAL #2   Title  Pt will improve FGA by at least 4 points in order to demonstrate clinically significant improvement in balance and decreased risk for falls.    Baseline  02/07/18: 19/30, 03/19/18= 26/30    Time  8    Period  Weeks    Status  Partially Met    Target Date  04/09/18      PT LONG TERM GOAL #3   Title  Pt will improve ABC by at least 13% in order to demonstrate clinically significant improvement in balance confidence.     Baseline  02/07/18: 65.63%. 03/19/18=73.75%    Time  8    Period  Weeks    Status  Partially Met    Target Date  04/09/18            Plan - 04/09/18 1119    Clinical Impression Statement Patient has reached goals and is ready to be DC to HEP.  Dynamic and static balance interventions continued today, with a focus on unilateral LE stability.  Pt tolerated all exercises well.  Intermediate dynamic standing balance tasks were progressed with cga needed for reaching outside of her base of support. Patient demonstrates understanding of HEP with min corrections needed. Patient challenged closed chain and open chain exercises with multiple repetitions due to fatigue. Weak LE combined with weak core musculature results in fair postural control and   minimal balance deficits. Patient will be DC today after education in HEP.     Rehab Potential  Good    PT Frequency  2x / week    PT Duration  8 weeks    PT  Treatment/Interventions  ADLs/Self Care Home Management;Aquatic Therapy;Biofeedback;Canalith Repostioning;Cryotherapy;Iontophoresis 42m/ml Dexamethasone;Electrical Stimulation;Moist Heat;Traction;Ultrasound;DME Instruction;Gait training;Stair training;Functional mobility training;Therapeutic activities;Therapeutic exercise;Balance training;Neuromuscular re-education;Cognitive remediation;Patient/family education;Manual techniques;Dry needling;Energy conservation;Vestibular    PT Next Visit Plan  MOCA?, mCTSIB, progress balance and strengthening program, consider introduction of mindfulness or other stress reduction techniques    PT Home Exercise Plan  Semitandem balance with horiz head turns, full tandem static balance    Consulted and Agree with Plan of Care  Patient       Patient will benefit from skilled therapeutic intervention in order to improve the following deficits and impairments:  Decreased balance, Difficulty walking  Visit Diagnosis: Unsteadiness on feet  History of falling     Problem List Patient Active Problem List   Diagnosis Date Noted  . Generalized abdominal pain 02/19/2018  . GAD (generalized anxiety disorder) 10/30/2017  . MDD (major depressive disorder), single episode, mild ([place] 10/30/2017  . Chronic insomnia 10/30/2017  . TMJ tenderness, right 05/04/2017  . Post-nasal drip 05/04/2017  . Ear ringing sound, right 05/04/2017  . 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 Epidermoid cyst of finger of left hand 07/27/2016  . Osteopenia 11/18/2015  . Chronic constipation 07/20/2015  . Internal hemorrhoid, bleeding 07/20/2015  . 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 Counseling regarding end of life decision making 09/08/2014  . Childhood asthma 08/13/2014  . Gastroesophageal reflux disease without esophagitis  08/13/2014  . Irritable bowel syndrome with constipation 08/13/2014  . Hx of migraines 08/13/2014  . Family history of colon cancer 08/13/2014  . Family history of uterine cancer 08/13/2014  . Family history of breast cancer 08/13/2014    MAlanson Puls PT DPT 04/09/2018, 11:20 AM  [place] [facility] [address] [address]Phone: [phone]  Fax:  [phone] Name: Jessica ECKRICHMRN: [id number] of Birth: 311-Jun-1942

## 2018-04-10 ENCOUNTER — Ambulatory Visit
Admission: RE | Admit: 2018-04-10 | Discharge: 2018-04-10 | Disposition: A | Discharge status: Home / Self Care | Payer: PPO | Source: Ambulatory Visit | Attending: Family Medicine | Admitting: Family Medicine

## 2018-04-10 ENCOUNTER — Ambulatory Visit: Payer: PPO | Admitting: Physical Therapy

## 2018-04-10 DIAGNOSIS — R922 Inconclusive mammogram: Secondary | ICD-10-CM | POA: Diagnosis not present

## 2018-04-10 DIAGNOSIS — N6310 Unspecified lump in the right breast, unspecified quadrant: Secondary | ICD-10-CM

## 2018-04-10 DIAGNOSIS — N644 Mastodynia: Secondary | ICD-10-CM | POA: Diagnosis not present

## 2018-04-16 ENCOUNTER — Ambulatory Visit: Payer: Self-pay | Admitting: Physical Therapy

## 2018-04-18 ENCOUNTER — Ambulatory Visit: Payer: Self-pay | Admitting: Physical Therapy

## 2018-04-23 ENCOUNTER — Ambulatory Visit: Payer: Self-pay | Admitting: Physical Therapy

## 2018-04-25 ENCOUNTER — Ambulatory Visit: Payer: Self-pay | Admitting: Physical Therapy

## 2018-04-29 DIAGNOSIS — N6310 Unspecified lump in the right breast, unspecified quadrant: Secondary | ICD-10-CM | POA: Insufficient documentation

## 2018-04-29 NOTE — Assessment & Plan Note (Signed)
Improved on  remeron.

## 2018-04-29 NOTE — Assessment & Plan Note (Signed)
Refer for mammogram to eval.

## 2018-06-04 ENCOUNTER — Ambulatory Visit (INDEPENDENT_AMBULATORY_CARE_PROVIDER_SITE_OTHER): Payer: PPO | Admitting: Family Medicine

## 2018-06-04 ENCOUNTER — Encounter: Payer: Self-pay | Admitting: Family Medicine

## 2018-06-04 VITALS — BP 132/64 | HR 75 | Temp 98.5°F | Ht 58.5 in | Wt 132.5 lb

## 2018-06-04 DIAGNOSIS — R4189 Other symptoms and signs involving cognitive functions and awareness: Secondary | ICD-10-CM

## 2018-06-04 DIAGNOSIS — F411 Generalized anxiety disorder: Secondary | ICD-10-CM

## 2018-06-04 DIAGNOSIS — F320 Major depressive disorder, single episode, mild: Secondary | ICD-10-CM | POA: Diagnosis not present

## 2018-06-04 DIAGNOSIS — R413 Other amnesia: Secondary | ICD-10-CM | POA: Diagnosis not present

## 2018-06-04 NOTE — Assessment & Plan Note (Signed)
Inadequate control..refer to counseling and increase remeron.

## 2018-06-04 NOTE — Patient Instructions (Addendum)
Stop prevagen.  Increase Remeron to 30 mg at bedtime for depression and anxiety ( can take 2 x tabs of 15 mg  To get the 30 mg) Call if prescription.   Please stop at the front desk to set up referral.

## 2018-06-04 NOTE — Assessment & Plan Note (Signed)
Mood worsened by husband not understanding how she is feeling. Refer to counseling.

## 2018-06-04 NOTE — Assessment & Plan Note (Signed)
Due to inadequate mood.  Neg neuro work up including  Labs, MRI and neuro referral.

## 2018-06-04 NOTE — Progress Notes (Signed)
Subjective:    Patient ID: Jessica Hardin, female    DOB: 09/22/1940, 77 y.o.   MRN: [id number]  HPI   77 year old female presents for follow up  GAD, MDD and memory loss.  At last OV she had improvement of symptoms on Remeron low dose 15 mg daily.  She reports today no  Improvement in mood.. slightly worse anxiety. She is frustrated with her husband and him not understanding how she feels. She is using prevagen in AM of and on... cause her to have some insomnia.   GAD 14  PHQ9: 10  Wt Readings from Last 3 Encounters:  06/04/18 132 lb 8 oz (60.1 kg)  04/02/18 126 lb 4 oz (57.3 kg)  03/08/18 126 lb (57.2 kg)      BP Readings from Last 3 Encounters:  06/04/18 132/64  04/02/18 100/60  03/08/18 110/60    Blood pressure 140/70, pulse 75, temperature 98.5 F (36.9 C), temperature source Oral, height 4' 10.5" (1.486 m), weight 132 lb 8 oz (60.1 kg). Social History /Family History/Past Medical History reviewed in detail and updated in EMR if needed.  Review of Systems  Constitutional: Negative for fatigue and fever.  HENT: Negative for congestion.   Eyes: Negative for pain.  Respiratory: Negative for cough and shortness of breath.   Cardiovascular: Negative for chest pain, palpitations and leg swelling.  Gastrointestinal: Negative for abdominal pain.  Genitourinary: Negative for dysuria and vaginal bleeding.  Musculoskeletal: Negative for back pain.  Neurological: Negative for syncope, light-headedness and headaches.  Psychiatric/Behavioral: Negative for dysphoric mood.       Objective:   Physical Exam Constitutional:      General: She is not in acute distress.    Appearance: Normal appearance. She is well-developed. She is not ill-appearing or toxic-appearing.  HENT:     Head: Normocephalic.     Right Ear: Hearing, tympanic membrane, ear canal and external ear normal. Tympanic membrane is not erythematous, retracted or bulging.     Left Ear: Hearing, tympanic  membrane, ear canal and external ear normal. Tympanic membrane is not erythematous, retracted or bulging.     Nose: No mucosal edema or rhinorrhea.     Right Sinus: No maxillary sinus tenderness or frontal sinus tenderness.     Left Sinus: No maxillary sinus tenderness or frontal sinus tenderness.     Mouth/Throat:     Pharynx: Uvula midline.  Eyes:     General: Lids are normal. Lids are everted, no foreign bodies appreciated.     Conjunctiva/sclera: Conjunctivae normal.     Pupils: Pupils are equal, round, and reactive to light.  Neck:     Musculoskeletal: Normal range of motion and neck supple.     Thyroid: No thyroid mass or thyromegaly.     Vascular: No carotid bruit.     Trachea: Trachea normal.  Cardiovascular:     Rate and Rhythm: Normal rate and regular rhythm.     Pulses: Normal pulses.     Heart sounds: Normal heart sounds, S1 normal and S2 normal. No murmur. No friction rub. No gallop.   Pulmonary:     Effort: Pulmonary effort is normal. No tachypnea or respiratory distress.     Breath sounds: Normal breath sounds. No decreased breath sounds, wheezing, rhonchi or rales.  Abdominal:     General: Bowel sounds are normal.     Palpations: Abdomen is soft.     Tenderness: There is no abdominal tenderness.  Skin:  General: Skin is warm and dry.     Findings: No rash.  Neurological:     Mental Status: She is alert and oriented to person, place, and time.     GCS: GCS eye subscore is 4. GCS verbal subscore is 5. GCS motor subscore is 6.     Cranial Nerves: No cranial nerve deficit.     Sensory: No sensory deficit.     Motor: No abnormal muscle tone.     Coordination: Coordination normal.     Gait: Gait normal.     Deep Tendon Reflexes: Reflexes are normal and symmetric.     Comments: Nml cerebellar exam   No papilledema  Psychiatric:        Mood and Affect: Mood is not anxious or depressed.        Speech: Speech normal.        Behavior: Behavior normal. Behavior is  cooperative.        Thought Content: Thought content normal.        Cognition and Memory: Memory is not impaired. She does not exhibit impaired recent memory or impaired remote memory.        Judgment: Judgment normal.           Assessment & Plan:

## 2018-06-14 ENCOUNTER — Telehealth: Payer: Self-pay

## 2018-06-14 NOTE — Telephone Encounter (Signed)
Pt was seen on 06/04/18. Remeron 30 mg is helping pt to rest better and pt is requesting new rx of remeron 30 mg taking one tab hs sent to [facility] [address]. Pt is out of town now and will be back in town on 06/17/18 and wants to pick up remeron 30 mg on 06/17/18.

## 2018-06-17 ENCOUNTER — Other Ambulatory Visit: Payer: Self-pay | Admitting: *Deleted

## 2018-06-17 MED ORDER — MIRTAZAPINE 30 MG PO TABS: 30.0000 mg | ORAL_TABLET | Freq: Every day | ORAL | 0 refills | Status: DC

## 2018-06-17 NOTE — Telephone Encounter (Signed)
Duplicate Message

## 2018-06-17 NOTE — Telephone Encounter (Signed)
Spoke to pt who states her remeron was increased to 30mg  daily and she is needing a refill with the new dosing. Rx can be sent to [address]. pls advise

## 2018-06-17 NOTE — Telephone Encounter (Signed)
Sent in 30

## 2018-06-17 NOTE — Telephone Encounter (Signed)
Pt called office today in reference to 12/27 note. Pt states she is almost out of medication due to increasing to 30mg  and needs a refill sent to [facility] on [address].

## 2018-06-18 NOTE — Telephone Encounter (Signed)
Prescription sent in on 06/17/2018 by Dr. Lorelei Pont.

## 2018-06-25 ENCOUNTER — Ambulatory Visit: Payer: Self-pay | Admitting: Family Medicine

## 2018-07-16 ENCOUNTER — Other Ambulatory Visit: Payer: Self-pay | Admitting: Family Medicine

## 2018-07-16 ENCOUNTER — Telehealth: Payer: Self-pay | Admitting: Family Medicine

## 2018-07-16 NOTE — Telephone Encounter (Signed)
We have already received request from pharmacy.  Just waiting on approval from Dr. Diona Browner.

## 2018-07-16 NOTE — Telephone Encounter (Signed)
Pt need refill on   Mirtazapine 30 mg   Sent to [facility]

## 2018-07-16 NOTE — Telephone Encounter (Signed)
Last office visit 06/04/2018 for GAD.  Last refilled 06/17/2018 for #30 with no refills.  CPE scheduled 09/03/2018.

## 2018-08-09 ENCOUNTER — Encounter: Payer: Self-pay | Admitting: Family Medicine

## 2018-08-09 ENCOUNTER — Ambulatory Visit (INDEPENDENT_AMBULATORY_CARE_PROVIDER_SITE_OTHER): Payer: PPO | Admitting: Family Medicine

## 2018-08-09 VITALS — BP 120/62 | HR 81 | Temp 98.6°F | Ht 58.5 in | Wt 132.0 lb

## 2018-08-09 DIAGNOSIS — R0982 Postnasal drip: Secondary | ICD-10-CM

## 2018-08-09 DIAGNOSIS — F320 Major depressive disorder, single episode, mild: Secondary | ICD-10-CM | POA: Diagnosis not present

## 2018-08-09 DIAGNOSIS — R4189 Other symptoms and signs involving cognitive functions and awareness: Secondary | ICD-10-CM | POA: Diagnosis not present

## 2018-08-09 DIAGNOSIS — F411 Generalized anxiety disorder: Secondary | ICD-10-CM | POA: Diagnosis not present

## 2018-08-09 MED ORDER — MIRTAZAPINE 30 MG PO TABS: 30.0000 mg | ORAL_TABLET | Freq: Every day | ORAL | 5 refills | Status: DC

## 2018-08-09 NOTE — Assessment & Plan Note (Signed)
Start nasal steroid spray 2 sprays per nostril daily. Can consider allergy medication is not improving.

## 2018-08-09 NOTE — Progress Notes (Signed)
Subjective:    Patient ID: Jessica Hardin, female    DOB: [birth year], 78 y.o.   MRN: [id number]  HPI 78 year old female presents for follow up stress MDD, and Anxiety, pseudodementia  GAD:  She is worried, very irritable.  Not better. She is only using remeron occ.. helps her to sleep.  Not taking regularly. Memory still poor. No SE. Was taking every day Trouble relaxing at night.  PHQ9 7  GAD7 14 GAD 7 : Generalized Anxiety Score 06/04/2018 10/30/2017  Nervous, Anxious, on Edge 3 2  Control/stop worrying 2 2  Worry too much - different things 2 2  Trouble relaxing 2 1  Restless 2 2  Easily annoyed or irritable 2 3  Afraid - awful might happen 1 3  Total GAD 7 Score 14 15  Anxiety Difficulty Somewhat difficult Somewhat difficult    Wt Readings from Last 3 Encounters:  08/09/18 132 lb (59.9 kg)  06/04/18 132 lb 8 oz (60.1 kg)  04/02/18 126 lb 4 oz (57.3 kg)    Blood pressure 120/62, pulse 81, temperature 98.6 F (37 C), temperature source Oral, height 4' 10.5" (1.486 m), weight 132 lb (59.9 kg), SpO2 97 %.   Review of Systems  Constitutional: Positive for fatigue. Negative for fever.  HENT: Positive for congestion and postnasal drip. Negative for ear pain and sore throat.   Eyes: Negative for pain.  Respiratory: Negative for cough and shortness of breath.   Cardiovascular: Negative for chest pain, palpitations and leg swelling.  Gastrointestinal: Negative for abdominal pain.  Genitourinary: Negative for dysuria and vaginal bleeding.  Musculoskeletal: Negative for back pain.  Neurological: Negative for syncope, light-headedness and headaches.  Psychiatric/Behavioral: Negative for dysphoric mood.       Objective:   Physical Exam Constitutional:      General: She is not in acute distress.    Appearance: Normal appearance. She is well-developed. She is not ill-appearing or toxic-appearing.  HENT:     Head: Normocephalic.     Right Ear: Hearing, ear canal and  external ear normal. A middle ear effusion is present. Tympanic membrane is not erythematous, retracted or bulging.     Left Ear: Hearing, ear canal and external ear normal. A middle ear effusion is present. Tympanic membrane is not erythematous, retracted or bulging.     Nose: Congestion present. No mucosal edema or rhinorrhea.     Right Sinus: No maxillary sinus tenderness or frontal sinus tenderness.     Left Sinus: No maxillary sinus tenderness or frontal sinus tenderness.     Mouth/Throat:     Pharynx: Uvula midline.  Eyes:     General: Lids are normal. Lids are everted, no foreign bodies appreciated.     Conjunctiva/sclera: Conjunctivae normal.     Pupils: Pupils are equal, round, and reactive to light.  Neck:     Musculoskeletal: Normal range of motion and neck supple.     Thyroid: No thyroid mass or thyromegaly.     Vascular: No carotid bruit.     Trachea: Trachea normal.  Cardiovascular:     Rate and Rhythm: Normal rate and regular rhythm.     Pulses: Normal pulses.     Heart sounds: Normal heart sounds, S1 normal and S2 normal. No murmur. No friction rub. No gallop.   Pulmonary:     Effort: Pulmonary effort is normal. No tachypnea or respiratory distress.     Breath sounds: Normal breath sounds. No decreased breath sounds, wheezing, rhonchi  or rales.  Abdominal:     General: Bowel sounds are normal.     Palpations: Abdomen is soft.     Tenderness: There is no abdominal tenderness.  Skin:    General: Skin is warm and dry.     Findings: No rash.  Neurological:     Mental Status: She is alert.  Psychiatric:        Mood and Affect: Mood is not anxious or depressed. Affect is blunt.        Speech: Speech normal.        Behavior: Behavior normal. Behavior is cooperative.        Thought Content: Thought content normal.        Cognition and Memory: Memory is impaired.        Judgment: Judgment normal.           Assessment & Plan:

## 2018-08-09 NOTE — Patient Instructions (Addendum)
Take Remeron 30 mg every day.  Please stop at the front desk to set up referral.  Start flonase 2 spras per nostril daily for post nasal drip and sinus presure.

## 2018-08-09 NOTE — Assessment & Plan Note (Signed)
Encouraged pt to take remeron daily.  refer to counselor.

## 2018-08-28 ENCOUNTER — Telehealth: Payer: Self-pay | Admitting: Family Medicine

## 2018-08-28 DIAGNOSIS — Z13220 Encounter for screening for lipoid disorders: Secondary | ICD-10-CM

## 2018-08-28 DIAGNOSIS — M8580 Other specified disorders of bone density and structure, unspecified site: Secondary | ICD-10-CM

## 2018-08-28 NOTE — Telephone Encounter (Signed)
-----   Message from Eustace Pen, LPN sent at 5/91/6384  3:20 PM EDT ----- Regarding: 3/12 - labs Lab orders needed. Thank you.

## 2018-08-29 ENCOUNTER — Ambulatory Visit: Payer: PPO

## 2018-08-30 ENCOUNTER — Other Ambulatory Visit (INDEPENDENT_AMBULATORY_CARE_PROVIDER_SITE_OTHER): Payer: PPO

## 2018-08-30 ENCOUNTER — Other Ambulatory Visit: Payer: Self-pay

## 2018-08-30 ENCOUNTER — Ambulatory Visit: Payer: Self-pay

## 2018-08-30 DIAGNOSIS — M8580 Other specified disorders of bone density and structure, unspecified site: Secondary | ICD-10-CM | POA: Diagnosis not present

## 2018-08-30 DIAGNOSIS — Z13220 Encounter for screening for lipoid disorders: Secondary | ICD-10-CM

## 2018-08-30 LAB — VITAMIN D 25 HYDROXY (VIT D DEFICIENCY, FRACTURES): VITD: 36.74 ng/mL (ref 30.00–100.00)

## 2018-08-30 LAB — COMPREHENSIVE METABOLIC PANEL
ALT: 17 U/L (ref 0–35)
AST: 25 U/L (ref 0–37)
Albumin: 4.2 g/dL (ref 3.5–5.2)
Alkaline Phosphatase: 79 U/L (ref 39–117)
BUN: 18 mg/dL (ref 6–23)
CO2: 31 mEq/L (ref 19–32)
Calcium: 9.6 mg/dL (ref 8.4–10.5)
Chloride: 104 mEq/L (ref 96–112)
Creatinine, Ser: 0.96 mg/dL (ref 0.40–1.20)
GFR: 67.84 mL/min (ref >60.00)
GLUCOSE: 99 mg/dL (ref 70–99)
Potassium: 3.7 mEq/L (ref 3.5–5.1)
Sodium: 141 mEq/L (ref 135–145)
Total Bilirubin: 0.3 mg/dL (ref 0.2–1.2)
Total Protein: 7.5 g/dL (ref 6.0–8.3)

## 2018-08-30 LAB — LIPID PANEL
Cholesterol: 212 mg/dL — ABNORMAL HIGH (ref 0–200)
HDL: 83.4 mg/dL (ref >39.00)
LDL CALC: 118 mg/dL — AB (ref 0–99)
NonHDL: 128.32
Total CHOL/HDL Ratio: 3
Triglycerides: 52 mg/dL (ref 0.0–149.0)
VLDL: 10.4 mg/dL (ref 0.0–40.0)

## 2018-09-03 ENCOUNTER — Encounter: Payer: Self-pay | Admitting: Family Medicine

## 2018-09-05 ENCOUNTER — Other Ambulatory Visit: Payer: Self-pay

## 2018-09-05 ENCOUNTER — Ambulatory Visit: Payer: PPO | Admitting: Psychology

## 2018-09-05 DIAGNOSIS — F418 Other specified anxiety disorders: Secondary | ICD-10-CM

## 2018-09-13 ENCOUNTER — Encounter: Payer: Self-pay | Admitting: Family Medicine

## 2018-09-13 ENCOUNTER — Ambulatory Visit (INDEPENDENT_AMBULATORY_CARE_PROVIDER_SITE_OTHER): Payer: PPO | Admitting: Family Medicine

## 2018-09-13 ENCOUNTER — Other Ambulatory Visit: Payer: Self-pay

## 2018-09-13 VITALS — BP 161/90

## 2018-09-13 DIAGNOSIS — J302 Other seasonal allergic rhinitis: Secondary | ICD-10-CM

## 2018-09-13 DIAGNOSIS — J3489 Other specified disorders of nose and nasal sinuses: Secondary | ICD-10-CM

## 2018-09-13 DIAGNOSIS — R0982 Postnasal drip: Secondary | ICD-10-CM

## 2018-09-13 NOTE — Patient Instructions (Signed)
Start generic Xyzal  (or Zyrtec) At bedtime for allergies cause of sinus drainage.  Continue flonase at bedtime 2 sprays per nostril daily.  Call if not improving in 2 weeks.Marland Kitchen at that time do a online appointment.

## 2018-09-13 NOTE — Progress Notes (Addendum)
Virtual Visit via Telephone Note Pt has no online or facetime access to do face to face.  I connected with Jessica Hardin on 09/13/18 at 11:00 AM EDT by telephone and verified that I am speaking with the correct person using two identifiers.   I discussed the limitations, risks, security and privacy concerns of performing an evaluation and management service by telephone and the availability of in person appointments. I also discussed with the patient that there may be a patient responsible charge related to this service. The patient expressed understanding and agreed to proceed.  Patient location: Home Provider Location: [place] Participants: Eliezer Lofts and Jessica Hardin   History of Present Illness:   78 year old female presents for a telephone visit.   She reports continued sinus drainage.. post nasal drip... going  for several months. No fever, no wheeze, no SOB.  no cough, no ear pain, no face pain. Some denture pain in left jaw.  No change  With location. No worse with eating or temp change.  She is using flonase in last several week... no improvement.  No sick contacts, went to [place] in February ( sym[ptomats since before she went) no COVID19   Social History /Family History/Past Medical History reviewed in detail and updated in EMR if needed. Past Medical History:  Diagnosis Date  . Allergy   . Asthma   . GERD (gastroesophageal reflux disease)   . History of chicken pox   . IBS (irritable bowel syndrome)   . Migraine   . Urinary tract infection     Observations/Objective:  No respiratory distress, A and O x 3    See problem based Assessment and Plan.  I discussed the assessment and treatment plan with the patient. The patient was provided an opportunity to ask questions and all were answered. The patient agreed with the plan and demonstrated an understanding of the instructions.   The patient was advised to call back or seek an in-person  evaluation if the symptoms worsen or if the condition fails to improve as anticipated.  I provided 15 minutes of non-face-to-face time during this encounter.   Eliezer Lofts, MD

## 2018-09-13 NOTE — Assessment & Plan Note (Signed)
No clear sign of infectious symptoms.  Add  antihistamine oral to flonase.  Avoid allergens.

## 2018-09-26 ENCOUNTER — Ambulatory Visit (INDEPENDENT_AMBULATORY_CARE_PROVIDER_SITE_OTHER): Payer: PPO | Admitting: Psychology

## 2018-09-26 DIAGNOSIS — F418 Other specified anxiety disorders: Secondary | ICD-10-CM | POA: Diagnosis not present

## 2018-10-02 ENCOUNTER — Telehealth: Payer: Self-pay | Admitting: *Deleted

## 2018-10-02 NOTE — Telephone Encounter (Signed)
Patient called stating that she was prescribed Zyrtec and Flonase which is not helping her symptoms. Patient stated that she has thick white phylem in her throat, especially at night. . Patient stated that she has a non-productive cough, and no fever. Patient stated that she really needs something to help her at night. Pharmacy [facility]

## 2018-10-03 ENCOUNTER — Ambulatory Visit (INDEPENDENT_AMBULATORY_CARE_PROVIDER_SITE_OTHER): Payer: PPO | Admitting: Family Medicine

## 2018-10-03 ENCOUNTER — Encounter: Payer: Self-pay | Admitting: Family Medicine

## 2018-10-03 DIAGNOSIS — J302 Other seasonal allergic rhinitis: Secondary | ICD-10-CM | POA: Diagnosis not present

## 2018-10-03 MED ORDER — MONTELUKAST SODIUM 10 MG PO TABS: 10.0000 mg | ORAL_TABLET | Freq: Every day | ORAL | 3 refills | Status: DC

## 2018-10-03 NOTE — Telephone Encounter (Signed)
Appointment 4/16 Pt aware

## 2018-10-03 NOTE — Assessment & Plan Note (Addendum)
Continue flonase 2 sprays per nostril. Change zyrtec to Xyzal. If not improving in 1 week add Singulair to regimen at bedtime.   no S/S of infection. No red flags.  no exposure to COVID known.

## 2018-10-03 NOTE — Telephone Encounter (Signed)
Call Have pt make virtual visit to reassess  Congestion/allergies today or tommorow.

## 2018-10-03 NOTE — Progress Notes (Signed)
VIRTUAL VISIT Due to national recommendations of social distancing due to [place] 19, a virtual visit is felt to be most appropriate for this patient at this time.   I connected with the patient on 10/03/18 at  3:45 PM EDT by virtual telehealth platform and verified that I am speaking with the correct person using two identifiers.   I discussed the limitations, risks, security and privacy concerns of performing an evaluation and management service by  virtual telehealth platform and the availability of in person appointments. I also discussed with the patient that there may be a patient responsible charge related to this service. The patient expressed understanding and agreed to proceed.  Patient location: Home Provider Location: [facility] Participants: Eliezer Lofts and [name]   No chief complaint on file.   History of Present Illness: 78 year old female presents with continued congestion  Despite Zyrtec.. she never started Xyzal as recommended at last OV.  She reports she feels well just is irritated by the post nasal drip down back on left throat.  no ear pain, no sinus pressure. No sore throat. No fever.  no SOB, no chest     COVID 19 screen No recent travel or known exposure to [place]   The importance of social distancing was discussed today.   Review of Systems  Constitutional: Negative for chills and fever.  HENT: Positive for congestion. Negative for ear pain, hearing loss, sinus pain, sore throat and tinnitus.   Eyes: Negative for pain and redness.  Respiratory: Negative for cough and shortness of breath.   Cardiovascular: Negative for chest pain, palpitations and leg swelling.  Gastrointestinal: Negative for abdominal pain, blood in stool, constipation, diarrhea, nausea and vomiting.  Genitourinary: Negative for dysuria.  Musculoskeletal: Negative for falls and myalgias.  Skin: Negative for rash.  Neurological: Negative for dizziness.   Psychiatric/Behavioral: Negative for depression. The patient is not nervous/anxious.       Past Medical History:  Diagnosis Date  . Allergy   . Asthma   . GERD (gastroesophageal reflux disease)   . History of chicken pox   . IBS (irritable bowel syndrome)   . Migraine   . Urinary tract infection     reports that she has never smoked. She has never used smokeless tobacco. She reports that she does not drink alcohol or use drugs.   Current Outpatient Medications:  .  albuterol (PROVENTIL HFA;VENTOLIN HFA) 108 (90 Base) MCG/ACT inhaler, Inhale 2 puffs every 6 (six) hours as needed into the lungs for wheezing or shortness of breath. (Patient not taking: Reported on 09/13/2018), Disp: 1 Inhaler, Rfl: 2 .  fluticasone (FLONASE) 50 MCG/ACT nasal spray, Place 2 sprays into both nostrils daily as needed. Reported on 09/08/2015, Disp: 16 g, Rfl: 0 .  mirtazapine (REMERON) 30 MG tablet, Take 1 tablet (30 mg total) by mouth at bedtime. (Patient not taking: Reported on 09/13/2018), Disp: 30 tablet, Rfl: 5 .  Multiple Vitamin (MULTIVITAMIN) capsule, Take 1 capsule by mouth daily. , Disp: , Rfl:    Observations/Objective: Height 4' 10.5" (1.486 m).  Physical Exam  Physical Exam Constitutional:      General: She is not in acute distress. Pulmonary:     Effort: Pulmonary effort is normal. No respiratory distress.  Neurological:     Mental Status: She is alert and oriented to person, place, and time.  Psychiatric:        Mood and Affect: Mood normal.  Behavior: Behavior normal.   Assessment and Plan Allergic rhinitis Continue flonase 2 sprays per nostril. Change zyrtec to Xyzal. If not improving in 1 week add Singulair to regimen at bedtime.   no S/S of infection. No red flags.  no exposure to COVID known.     I discussed the assessment and treatment plan with the patient. The patient was provided an opportunity to ask questions and all were answered. The patient agreed with the plan  and demonstrated an understanding of the instructions.   The patient was advised to call back or seek an in-person evaluation if the symptoms worsen or if the condition fails to improve as anticipated.     Eliezer Lofts, MD

## 2018-10-03 NOTE — Patient Instructions (Signed)
Continue flonase 2 sprays per nostril. Change zyrtec to Xyzal. If not improving in 1 week add Singulair to regimen at bedtime.  Allergic Rhinitis, Adult Allergic rhinitis is an allergic reaction that affects the mucous membrane inside the nose. It causes sneezing, a runny or stuffy nose, and the feeling of mucus going down the back of the throat (postnasal drip). Allergic rhinitis can be mild to severe. There are two types of allergic rhinitis:  Seasonal. This type is also called hay fever. It happens only during certain seasons.  Perennial. This type can happen at any time of the year. What are the causes? This condition happens when the body's defense system (immune system) responds to certain harmless substances called allergens as though they were germs.  Seasonal allergic rhinitis is triggered by pollen, which can come from grasses, trees, and weeds. Perennial allergic rhinitis may be caused by:  House dust mites.  Pet dander.  Mold spores. What are the signs or symptoms? Symptoms of this condition include:  Sneezing.  Runny or stuffy nose (nasal congestion).  Postnasal drip.  Itchy nose.  Tearing of the eyes.  Trouble sleeping.  Daytime sleepiness. How is this diagnosed? This condition may be diagnosed based on:  Your medical history.  A physical exam.  Tests to check for related conditions, such as: ? Asthma. ? Pink eye. ? Ear infection. ? Upper respiratory infection.  Tests to find out which allergens trigger your symptoms. These may include skin or blood tests. How is this treated? There is no cure for this condition, but treatment can help control symptoms. Treatment may include:  Taking medicines that block allergy symptoms, such as antihistamines. Medicine may be given as a shot, nasal spray, or pill.  Avoiding the allergen.  Desensitization. This treatment involves getting ongoing shots until your body becomes less sensitive to the allergen. This  treatment may be done if other treatments do not help.  If taking medicine and avoiding the allergen does not work, new, stronger medicines may be prescribed. Follow these instructions at home:  Find out what you are allergic to. Common allergens include smoke, dust, and pollen.  Avoid the things you are allergic to. These are some things you can do to help avoid allergens: ? Replace carpet with wood, tile, or vinyl flooring. Carpet can trap dander and dust. ? Do not smoke. Do not allow smoking in your home. ? Change your heating and air conditioning filter at least once a month. ? During allergy season:  Keep windows closed as much as possible.  Plan outdoor activities when pollen counts are lowest. This is usually during the evening hours.  When coming indoors, change clothing and shower before sitting on furniture or bedding.  Take over-the-counter and prescription medicines only as told by your health care provider.  Keep all follow-up visits as told by your health care provider. This is important. Contact a health care provider if:  You have a fever.  You develop a persistent cough.  You make whistling sounds when you breathe (you wheeze).  Your symptoms interfere with your normal daily activities. Get help right away if:  You have shortness of breath. Summary  This condition can be managed by taking medicines as directed and avoiding allergens.  Contact your health care provider if you develop a persistent cough or fever.  During allergy season, keep windows closed as much as possible. This information is not intended to replace advice given to you by your health care provider. Make  sure you discuss any questions you have with your health care provider. Document Released: 02/28/2001 Document Revised: 07/13/2016 Document Reviewed: 07/13/2016 Elsevier Interactive Patient Education  2019 [employer].

## 2018-10-04 ENCOUNTER — Telehealth: Payer: Self-pay

## 2018-10-04 ENCOUNTER — Ambulatory Visit: Payer: Self-pay | Admitting: *Deleted

## 2018-10-04 ENCOUNTER — Telehealth: Payer: Self-pay | Admitting: Family Medicine

## 2018-10-04 NOTE — Telephone Encounter (Signed)
I spoke with pt and she did have virtual visit on 10/03/18 and nothing further needed.

## 2018-10-04 NOTE — Telephone Encounter (Signed)
Patient wants to make sure that she was given the correct medication- Rx for singulair and she wants to make sure the alternative is correct- montelukast. Verified for patient she was given the generic of name brand singulair.  Reason for Disposition . Caller has medication question only, adult not sick, and triager answers question  Protocols used: MEDICATION QUESTION CALL-A-AH

## 2018-10-04 NOTE — Progress Notes (Signed)
AVS mailed to patient as instructed by Dr. Bedsole. 

## 2018-10-04 NOTE — Telephone Encounter (Signed)
Copied from [place] [id number]. Topic: General - Other >> Oct 03, 2018  4:41 PM Mcneil, Ja-Kwan wrote: Reason for CRM: Pt stated she was told she would be contacted for a virtual visit but she has not heard from anyone. Pt requests call back. Cb# [phone]

## 2018-10-04 NOTE — Telephone Encounter (Signed)
error 

## 2018-10-07 ENCOUNTER — Telehealth: Payer: Self-pay | Admitting: Family Medicine

## 2018-10-07 NOTE — Telephone Encounter (Signed)
Best number [phone]  Pt called wanting to get clarification on how to take  The rx that was prescibed to her from dr Diona Browner  Montelukast  And xyzal She stated she took this  Friday at bedtime  and she thought she was hearing people outside of the house and she was shaky  She had not taken them since  She also wanted to know She has allergies in summer, if her nose and eye start running does she need to call the office concerning covid19

## 2018-10-07 NOTE — Telephone Encounter (Signed)
[name] notified as instructed by telephone.

## 2018-10-07 NOTE — Telephone Encounter (Signed)
No does not need to call if feels like typical covid.  Given SE together  Have her try Xyzal alone for 1 week At BEDTIME.Marland Kitchen if not improving can stop and change to claritin in day and motelukast at bedtime

## 2018-10-10 ENCOUNTER — Ambulatory Visit: Payer: PPO | Admitting: Psychology

## 2018-10-24 ENCOUNTER — Ambulatory Visit (INDEPENDENT_AMBULATORY_CARE_PROVIDER_SITE_OTHER): Payer: PPO | Admitting: Psychology

## 2018-10-24 DIAGNOSIS — F418 Other specified anxiety disorders: Secondary | ICD-10-CM | POA: Diagnosis not present

## 2018-10-29 ENCOUNTER — Telehealth: Payer: Self-pay | Admitting: Family Medicine

## 2018-10-29 NOTE — Telephone Encounter (Signed)
Jessica Hardin notified as instructed.  She states it is not her allergies she is concerned about.  She is feeling jittery and something is causing her to have bad breath.  Virtual visit scheduled with Dr. Diona Browner on 10/31/2018 at 2:20 pm.

## 2018-10-29 NOTE — Telephone Encounter (Signed)
For post nasal drip and " sinuses" this is chronic and in past no evidence of infection... unless new fever  Or unilateral face pain.Marland KitchenMarland Kitchen I would recommend referral to an allergist.  let me know if she is agreeable.   If shaking not improving.. pt can  make virtual OV for that.

## 2018-10-29 NOTE — Telephone Encounter (Signed)
Patient started taking Claritin during the day and Singulair at night. Patient is still having the drip and when she gargles it's like clear foam.  Patient said she has had bad breath and she doesn't know where it's coming from. Patient said she started shaking all over the end of last week.  She said her foot shakes so bad it slips off the gas pedal while she's driving.  Patient uses [employer]

## 2018-10-31 ENCOUNTER — Ambulatory Visit (INDEPENDENT_AMBULATORY_CARE_PROVIDER_SITE_OTHER): Payer: PPO | Admitting: Family Medicine

## 2018-10-31 ENCOUNTER — Encounter: Payer: Self-pay | Admitting: Family Medicine

## 2018-10-31 VITALS — BP 142/77 | HR 74 | Temp 96.8°F | Ht 58.5 in | Wt 127.2 lb

## 2018-10-31 DIAGNOSIS — R251 Tremor, unspecified: Secondary | ICD-10-CM | POA: Diagnosis not present

## 2018-10-31 DIAGNOSIS — F320 Major depressive disorder, single episode, mild: Secondary | ICD-10-CM

## 2018-10-31 DIAGNOSIS — F411 Generalized anxiety disorder: Secondary | ICD-10-CM | POA: Diagnosis not present

## 2018-10-31 DIAGNOSIS — R0982 Postnasal drip: Secondary | ICD-10-CM

## 2018-10-31 MED ORDER — MIRTAZAPINE 30 MG PO TABS: 30.0000 mg | ORAL_TABLET | Freq: Every day | ORAL | 5 refills | Status: DC

## 2018-10-31 NOTE — Progress Notes (Signed)
VIRTUAL VISIT Due to national recommendations of social distancing due to [place] 19, a virtual visit is felt to be most appropriate for this patient at this time.   I connected with the patient on 10/31/18 at  2:20 PM EDT by virtual telehealth platform and verified that I am speaking with the correct person using two identifiers.   I discussed the limitations, risks, security and privacy concerns of performing an evaluation and management service by  virtual telehealth platform and the availability of in person appointments. I also discussed with the patient that there may be a patient responsible charge related to this service. The patient expressed understanding and agreed to proceed.  Patient location: Home Provider Location: [facility] [place] Participants: Eliezer Lofts and [name]   Chief Complaint  Patient presents with  . Halitosis  . Jittery    History of Present Illness: 78 year old female with history of MDD, GAD  presents with new onset jitteriness.  She has also noted halitosis and her post nasal drip from allergies continue.  She first noted jitteriness in last week.. started when she was driving.. foot and hands jittery also shakiness in pit of stomach.. off and on. Worse off and on.  Seems worse when she is "stressing" She feels more anxious lately.  She  Is going to counselor.  She realizes that she is no longer taking the mirtazapine. Sleeping 6 hours a night, occ 2.  NO N/V.  NO CP, no SOB  No increase in urination.   She has lost some weight in last  5 lbs in last 3 months. Eating well. Appetite moderate. Wt Readings from Last 3 Encounters:  10/31/18 127 lb 4 oz (57.7 kg)  08/09/18 132 lb (59.9 kg)  06/04/18 132 lb 8 oz (60.1 kg)    COVID 19 screen No recent travel or known exposure to COVID19 The patient denies respiratory symptoms of COVID 19 at this time.  The importance of social distancing was discussed today.   Review of Systems   Constitutional: Negative for chills and fever.  HENT: Negative for congestion and ear pain.   Eyes: Negative for pain and redness.  Respiratory: Negative for cough and shortness of breath.   Cardiovascular: Negative for chest pain, palpitations and leg swelling.  Gastrointestinal: Negative for abdominal pain, blood in stool, constipation, diarrhea, nausea and vomiting.  Genitourinary: Negative for dysuria.  Musculoskeletal: Negative for falls and myalgias.  Skin: Negative for rash.  Neurological: Negative for dizziness.  Psychiatric/Behavioral: Negative for depression. The patient is not nervous/anxious.       Past Medical History:  Diagnosis Date  . Allergy   . Asthma   . GERD (gastroesophageal reflux disease)   . History of chicken pox   . IBS (irritable bowel syndrome)   . Migraine   . Urinary tract infection     reports that she has never smoked. She has never used smokeless tobacco. She reports that she does not drink alcohol or use drugs.   Current Outpatient Medications:  .  albuterol (PROVENTIL HFA;VENTOLIN HFA) 108 (90 Base) MCG/ACT inhaler, Inhale 2 puffs every 6 (six) hours as needed into the lungs for wheezing or shortness of breath., Disp: 1 Inhaler, Rfl: 2 .  fluticasone (FLONASE) 50 MCG/ACT nasal spray, Place 2 sprays into both nostrils daily as needed. Reported on 09/08/2015, Disp: 16 g, Rfl: 0 .  mirtazapine (REMERON) 30 MG tablet, Take 1 tablet (30 mg total) by mouth at bedtime., Disp: 30 tablet,  Rfl: 5 .  montelukast (SINGULAIR) 10 MG tablet, Take 1 tablet (10 mg total) by mouth at bedtime., Disp: 30 tablet, Rfl: 3 .  Multiple Vitamin (MULTIVITAMIN) capsule, Take 1 capsule by mouth daily. , Disp: , Rfl:    Observations/Objective: Blood pressure (!) 142/77, pulse 74, temperature (!) 96.8 F (36 C), temperature source Oral, height 4' 10.5" (1.486 m), weight 127 lb 4 oz (57.7 kg).  Physical Exam  Physical Exam Constitutional:      General: The patient is not in  acute distress. Pulmonary:     Effort: Pulmonary effort is normal. No respiratory distress.  Neurological:     Mental Status: The patient is alert and oriented to person, place, and time.  Psychiatric:        Mood and Affect: Mood normal.        Behavior: Behavior normal.   Assessment and Plan GAD (generalized anxiety disorder) Jitteriness.. this is how she describes her anxiety. We will start by having her restart remeron at bedtime.. if not improving .. consider atarax prn or increase in remeron dose. Also consider lab eval with thyroid to make sure no new issue.  Post-nasal drip Problematic for pt .. offered referral to allergist.. she refused. Halitosis likely due to PND and allergies.    I discussed the assessment and treatment plan with the patient. The patient was provided an opportunity to ask questions and all were answered. The patient agreed with the plan and demonstrated an understanding of the instructions.   The patient was advised to call back or seek an in-person evaluation if the symptoms worsen or if the condition fails to improve as anticipated.     Eliezer Lofts, MD

## 2018-10-31 NOTE — Patient Instructions (Signed)
Restart mirtazapine at bedtime for anxiety and depression.  Call us in 2 week if not improving as expected.

## 2018-10-31 NOTE — Assessment & Plan Note (Signed)
Problematic for pt .. offered referral to allergist.. she refused.

## 2018-10-31 NOTE — Assessment & Plan Note (Signed)
Jitteriness.. this is how she describes her anxiety. We will start by having her restart remeron at bedtime.. if not improving .. consider atarax prn or increase in remeron dose. Also consider lab eval with thyroid to make sure no new issue.

## 2018-11-01 ENCOUNTER — Telehealth: Payer: Self-pay | Admitting: Family Medicine

## 2018-11-01 NOTE — Telephone Encounter (Signed)
Patient said someone attempted to call her at 1:51 today.  Did you try to call her?  If so, please call her back.

## 2018-11-01 NOTE — Telephone Encounter (Signed)
I have not called patient today.

## 2018-11-01 NOTE — Telephone Encounter (Signed)
I have not called.

## 2018-11-06 ENCOUNTER — Telehealth: Payer: Self-pay

## 2018-11-06 NOTE — Telephone Encounter (Signed)
Pt said she only took mirtazapine 30 mg on and off in Jan. Pt just got the mirtazapine 30 mg refilled and pt was reading the potential side effects of may cause suicidal tendency. Pt wants to know if med is safe. Pt said she has been taking mirtazapine 30 mg nightly since 10/31/18 and once in a while the thought of harming herself will pop into her head and pt will feel anxious but pt said she prays it away. I advised pt not to take mirtazapine tonight; pt voiced understanding. Pt said no SI/HI now. Pt will wait for cb from Dr Rometta Emery assistant on 11/07/18. I advised pt if thought came back into pts mind to harm herself or anyone else to go to ED immediately. Pt voiced understanding. [facility] [address]

## 2018-11-08 NOTE — Telephone Encounter (Signed)
Let pt know.Marland Kitchen all antidepressants have that listed as a very depressed person has decreased motivation but when treated with emdicaiton they can have improved energy and motivation but mood may not be reosolved so they are now able to act of thei9r previosu suicidal thoughts.    If she truly thinks it has caused her to become suicidal... she needs to stay off the medicaiton.  If she thinks she started thinking about it after she read the med info on potential side effects... I would continue it.

## 2018-11-08 NOTE — Telephone Encounter (Signed)
Mrs. Petrucelli notified as instructed by telephone.  She states she will stay off medication for now and call us back if she feels like she needs something different.  Medication list updated.

## 2018-11-21 ENCOUNTER — Ambulatory Visit (INDEPENDENT_AMBULATORY_CARE_PROVIDER_SITE_OTHER): Payer: PPO | Admitting: Psychology

## 2018-11-21 DIAGNOSIS — F418 Other specified anxiety disorders: Secondary | ICD-10-CM

## 2018-12-03 ENCOUNTER — Other Ambulatory Visit: Payer: Self-pay

## 2018-12-03 ENCOUNTER — Ambulatory Visit (INDEPENDENT_AMBULATORY_CARE_PROVIDER_SITE_OTHER): Payer: PPO | Admitting: Family Medicine

## 2018-12-03 ENCOUNTER — Encounter: Payer: Self-pay | Admitting: Family Medicine

## 2018-12-03 VITALS — BP 116/62 | HR 79 | Temp 97.7°F | Ht 58.75 in | Wt 128.5 lb

## 2018-12-03 DIAGNOSIS — Z0000 Encounter for general adult medical examination without abnormal findings: Secondary | ICD-10-CM

## 2018-12-03 DIAGNOSIS — F5104 Psychophysiologic insomnia: Secondary | ICD-10-CM | POA: Diagnosis not present

## 2018-12-03 DIAGNOSIS — J302 Other seasonal allergic rhinitis: Secondary | ICD-10-CM

## 2018-12-03 DIAGNOSIS — F320 Major depressive disorder, single episode, mild: Secondary | ICD-10-CM

## 2018-12-03 DIAGNOSIS — F411 Generalized anxiety disorder: Secondary | ICD-10-CM

## 2018-12-03 DIAGNOSIS — R0982 Postnasal drip: Secondary | ICD-10-CM

## 2018-12-03 MED ORDER — TRAZODONE HCL 50 MG PO TABS: 25.0000 mg | ORAL_TABLET | Freq: Every evening | ORAL | 3 refills | Status: DC | PRN

## 2018-12-03 NOTE — Assessment & Plan Note (Signed)
Trial of trazodone at night for sleep.

## 2018-12-03 NOTE — Progress Notes (Signed)
Chief Complaint  Patient presents with  . Annual Exam    History of Present Illness: HPI  The patient presents for annual medicare wellness, complete physical and review of chronic health problems. He/She also has the following acute concerns today:  I have personally reviewed the Medicare Annual Wellness questionnaire and have noted 1. The patient's medical and social history 2. Their use of alcohol, tobacco or illicit drugs 3. Their current medications and supplements 4. The patient's functional ability including ADL's, fall risks, home safety risks and hearing or visual             impairment. 5. Diet and physical activities 6. Evidence for depression or mood disorders 7.         Updated provider list Cognitive evaluation was performed and recorded on pt medicare questionnaire form. The patients weight, height, BMI and visual acuity have been recorded in the chart  I have made referrals, counseling and provided education to the patient based review of the above and I have provided the pt with a written personalized care plan for preventive services.   Documentation of this information was scanned into the electronic record under the media tab.   Advance directives and end of life planning reviewed in detail with patient and documented in EMR. Patient given handout on advance care directives if needed. HCPOA and living will updated if needed.  Hearing Screening   125Hz  250Hz  500Hz  1000Hz  2000Hz  3000Hz  4000Hz  6000Hz  8000Hz   Right ear:   20 20 20  20     Left ear:   20 20 20  20       Visual Acuity Screening   Right eye Left eye Both eyes  Without correction: 20/25 20/20   With correction:   20/15    Fall Risk  12/03/2018 01/15/2018 01/30/2017 11/04/2015  Falls in the past year? 0 No No No  Comment - pt does not remember any falls but says she might have - -  Number falls in past yr: 0 - - -  Injury with Fall? 0 - - -  Risk for fall due to : - Impaired balance/gait - -    PHQ2  today 1  Depression screen [facility] 2/9 08/09/2018 06/04/2018 10/30/2017  Decreased Interest - 1 0  Down, Depressed, Hopeless 2 2 2   PHQ - 2 Score 2 3 2   Altered sleeping 3 3 3   Tired, decreased energy - 0 2  Change in appetite 1 1 2   Feeling bad or failure about yourself  - 0 0  Trouble concentrating 2 2 3   Moving slowly or fidgety/restless 1 1 2   Suicidal thoughts - 0 0  PHQ-9 Score 9 10 14   Difficult doing work/chores Somewhat difficult Somewhat difficult Somewhat difficult    MDD, pseudodementia, GAD: Remeron does not seem to help when she uses it. Maybe helps her sleep. She feels less anxious lately.   GAD7 8. Lately only sleeping 4 hours a night  Allergic rhinitis: on singulair, flonase Not any better.  COVID 19 screen No recent travel or known exposure to COVID19 The patient denies respiratory symptoms of COVID 19 at this time.  The importance of social distancing was discussed today.   Review of Systems  Constitutional: Negative for chills and fever.  HENT: Negative for congestion and ear pain.   Eyes: Negative for pain and redness.  Respiratory: Negative for cough and shortness of breath.   Cardiovascular: Negative for chest pain, palpitations and leg swelling.  Gastrointestinal: Negative for abdominal  pain, blood in stool, constipation, diarrhea, nausea and vomiting.  Genitourinary: Negative for dysuria.  Musculoskeletal: Negative for falls and myalgias.  Skin: Negative for rash.  Neurological: Negative for dizziness.  Psychiatric/Behavioral: Negative for depression. The patient is not nervous/anxious.       Past Medical History:  Diagnosis Date  . Allergy   . Asthma   . GERD (gastroesophageal reflux disease)   . History of chicken pox   . IBS (irritable bowel syndrome)   . Migraine   . Urinary tract infection     reports that she has never smoked. She has never used smokeless tobacco. She reports that she does not drink alcohol or use drugs.   Current  Outpatient Medications:  .  albuterol (PROVENTIL HFA;VENTOLIN HFA) 108 (90 Base) MCG/ACT inhaler, Inhale 2 puffs every 6 (six) hours as needed into the lungs for wheezing or shortness of breath., Disp: 1 Inhaler, Rfl: 2 .  fluticasone (FLONASE) 50 MCG/ACT nasal spray, Place 2 sprays into both nostrils daily as needed. Reported on 09/08/2015, Disp: 16 g, Rfl: 0 .  Multiple Vitamin (MULTIVITAMIN) capsule, Take 1 capsule by mouth daily. , Disp: , Rfl:  .  montelukast (SINGULAIR) 10 MG tablet, Take 1 tablet (10 mg total) by mouth at bedtime. (Patient not taking: Reported on 12/03/2018), Disp: 30 tablet, Rfl: 3   Observations/Objective: Blood pressure 116/62, pulse 79, temperature 97.7 F (36.5 C), temperature source Other (Comment), height 4' 10.75" (1.492 m), weight 128 lb 8 oz (58.3 kg), SpO2 97 %.  Physical Exam Constitutional:      General: She is not in acute distress.    Appearance: Normal appearance. She is well-developed. She is not ill-appearing or toxic-appearing.  HENT:     Head: Normocephalic.     Right Ear: Hearing, tympanic membrane, ear canal and external ear normal.     Left Ear: Hearing, tympanic membrane, ear canal and external ear normal.     Nose: Nose normal.  Eyes:     General: Lids are normal. Lids are everted, no foreign bodies appreciated.     Conjunctiva/sclera: Conjunctivae normal.     Pupils: Pupils are equal, round, and reactive to light.  Neck:     Musculoskeletal: Normal range of motion and neck supple.     Thyroid: No thyroid mass or thyromegaly.     Vascular: No carotid bruit.     Trachea: Trachea normal.  Cardiovascular:     Rate and Rhythm: Normal rate and regular rhythm.     Heart sounds: Normal heart sounds, S1 normal and S2 normal. No murmur. No gallop.   Pulmonary:     Effort: Pulmonary effort is normal. No respiratory distress.     Breath sounds: Normal breath sounds. No wheezing, rhonchi or rales.  Abdominal:     General: Bowel sounds are normal.  There is no distension or abdominal bruit.     Palpations: Abdomen is soft. There is no fluid wave or mass.     Tenderness: There is no abdominal tenderness. There is no guarding or rebound.     Hernia: No hernia is present.  Lymphadenopathy:     Cervical: No cervical adenopathy.  Skin:    General: Skin is warm and dry.     Findings: No rash.  Neurological:     Mental Status: She is alert.     Cranial Nerves: No cranial nerve deficit.     Sensory: No sensory deficit.  Psychiatric:        Mood  and Affect: Mood is not anxious or depressed.        Speech: Speech normal.        Behavior: Behavior normal. Behavior is cooperative.        Judgment: Judgment normal.      Assessment and Plan   The patient's preventative maintenance and recommended screening tests for an annual wellness exam were reviewed in full today. Brought up to date unless services declined.  Counselled on the importance of diet, exercise, and its role in overall health and mortality. The patient's FH and SH was reviewed, including their home life, tobacco status, and drug and alcohol status.   Last mammo 01/2018,  Sister with breast cancer, plan this year Colonoscopy: colonoscopynml 2008 Dr. Tiffany Kocher. Repeat was due in 5 years. Since then saw Dr. Delano Metz, [facility]. 2009 sigmoid nml! Brother with colon cancer. She is not interested in repeating at this time. PAP/DVE: TAH,not indicated DXA: 11/2015 osteopenia, repeat in 5 years Nonsmoker    Eliezer Lofts, MD

## 2018-12-03 NOTE — Patient Instructions (Addendum)
We will call will referral to allergist. Trial of trazodne for insomnia. Call to set up mammogram on your own.

## 2018-12-03 NOTE — Assessment & Plan Note (Signed)
More SE than benefit of remeron. Stay of medication.

## 2018-12-03 NOTE — Assessment & Plan Note (Signed)
Antihistamines , nasal steroids, Singulair not effective. Very bothersome to pt.. refer to allergist.

## 2018-12-03 NOTE — Assessment & Plan Note (Signed)
Improved control. 

## 2019-01-16 ENCOUNTER — Ambulatory Visit: Payer: PPO | Admitting: Family Medicine

## 2019-01-21 ENCOUNTER — Other Ambulatory Visit: Payer: Self-pay

## 2019-01-21 ENCOUNTER — Ambulatory Visit (INDEPENDENT_AMBULATORY_CARE_PROVIDER_SITE_OTHER): Payer: PPO | Admitting: Family Medicine

## 2019-01-21 ENCOUNTER — Encounter: Payer: Self-pay | Admitting: Family Medicine

## 2019-01-21 VITALS — BP 130/70 | HR 77 | Temp 98.8°F | Ht 58.75 in | Wt 127.2 lb

## 2019-01-21 DIAGNOSIS — F411 Generalized anxiety disorder: Secondary | ICD-10-CM

## 2019-01-21 DIAGNOSIS — F5104 Psychophysiologic insomnia: Secondary | ICD-10-CM | POA: Diagnosis not present

## 2019-01-21 DIAGNOSIS — M2550 Pain in unspecified joint: Secondary | ICD-10-CM | POA: Diagnosis not present

## 2019-01-21 NOTE — Assessment & Plan Note (Signed)
SE to several meds. Refer to counselor for cognitive behavoial therapy. ( Has seen psych in past for cognitive assessment)

## 2019-01-21 NOTE — Patient Instructions (Addendum)
Can try  1 full tablet of trazodone for sleep at night.  Can use Tylenol for arthritis pain as needed.  Can also try glucosamine  500 mg twice daily. WE iwll call you to set up counselor for anxiety.

## 2019-01-21 NOTE — Progress Notes (Signed)
Chief Complaint  Patient presents with  . Anxiety  . Shoulder Pain    Right    History of Present Illness: HPI  78 year old female presents for follow up.   She has been noting right shoulder pain in last 2 days. No falls.  Does note joint pain moves to other joints off and on, mild last 1 days, moves to another joint next day.. Comes a goes.  No particular trigger.  Has not treated with anything. No redness or swelling in joints.  At last OV in 11/2018: MDD, pseudodementia, GAD: Remeron does not seem to help when she uses it. Maybe helps her sleep. She feels less anxious lately.   GAD7 8. Lately only sleeping 4 hours a night.  Started on trazodone for sleep. She reports she has slept some more.. 6 hours a night.  She is still anxious off and on.. triggered by family and husband.  GERD is stable.  COVID 19 screen No recent travel or known exposure to COVID19 The patient denies respiratory symptoms of COVID 19 at this time.  The importance of social distancing was discussed today.   Review of Systems  Constitutional: Negative for chills and fever.  HENT: Negative for congestion and ear pain.   Eyes: Negative for pain and redness.  Respiratory: Negative for cough and shortness of breath.   Cardiovascular: Negative for chest pain, palpitations and leg swelling.  Gastrointestinal: Negative for abdominal pain, blood in stool, constipation, diarrhea, nausea and vomiting.  Genitourinary: Negative for dysuria.  Musculoskeletal: Positive for back pain and joint pain. Negative for falls and myalgias.  Skin: Negative for rash.  Neurological: Negative for dizziness.  Psychiatric/Behavioral: Negative for depression. The patient is not nervous/anxious.       Past Medical History:  Diagnosis Date  . Allergy   . Asthma   . GERD (gastroesophageal reflux disease)   . History of chicken pox   . IBS (irritable bowel syndrome)   . Migraine   . Urinary tract infection     reports that  she has never smoked. She has never used smokeless tobacco. She reports that she does not drink alcohol or use drugs.   Current Outpatient Medications:  .  albuterol (PROVENTIL HFA;VENTOLIN HFA) 108 (90 Base) MCG/ACT inhaler, Inhale 2 puffs every 6 (six) hours as needed into the lungs for wheezing or shortness of breath., Disp: 1 Inhaler, Rfl: 2 .  fluticasone (FLONASE) 50 MCG/ACT nasal spray, Place 2 sprays into both nostrils daily as needed. Reported on 09/08/2015, Disp: 16 g, Rfl: 0 .  montelukast (SINGULAIR) 10 MG tablet, Take 1 tablet (10 mg total) by mouth at bedtime., Disp: 30 tablet, Rfl: 3 .  Multiple Vitamin (MULTIVITAMIN) capsule, Take 1 capsule by mouth daily. , Disp: , Rfl:  .  traZODone (DESYREL) 50 MG tablet, Take 0.5-1 tablets (25-50 mg total) by mouth at bedtime as needed for sleep., Disp: 30 tablet, Rfl: 3   Observations/Objective: Blood pressure 130/70, pulse 77, temperature 98.8 F (37.1 C), temperature source Temporal, height 4' 10.75" (1.492 m), weight 127 lb 4 oz (57.7 kg), SpO2 97 %.  Physical Exam Constitutional:      General: She is not in acute distress.    Appearance: Normal appearance. She is well-developed. She is not ill-appearing or toxic-appearing.  HENT:     Head: Normocephalic.     Right Ear: Hearing, tympanic membrane, ear canal and external ear normal. Tympanic membrane is not erythematous, retracted or bulging.  Left Ear: Hearing, tympanic membrane, ear canal and external ear normal. Tympanic membrane is not erythematous, retracted or bulging.     Nose: No mucosal edema or rhinorrhea.     Right Sinus: No maxillary sinus tenderness or frontal sinus tenderness.     Left Sinus: No maxillary sinus tenderness or frontal sinus tenderness.     Mouth/Throat:     Pharynx: Uvula midline.  Eyes:     General: Lids are normal. Lids are everted, no foreign bodies appreciated.     Conjunctiva/sclera: Conjunctivae normal.     Pupils: Pupils are equal, round, and  reactive to light.  Neck:     Musculoskeletal: Normal range of motion and neck supple.     Thyroid: No thyroid mass or thyromegaly.     Vascular: No carotid bruit.     Trachea: Trachea normal.  Cardiovascular:     Rate and Rhythm: Normal rate and regular rhythm.     Pulses: Normal pulses.     Heart sounds: Normal heart sounds, S1 normal and S2 normal. No murmur. No friction rub. No gallop.   Pulmonary:     Effort: Pulmonary effort is normal. No tachypnea or respiratory distress.     Breath sounds: Normal breath sounds. No decreased breath sounds, wheezing, rhonchi or rales.  Abdominal:     General: Bowel sounds are normal.     Palpations: Abdomen is soft.     Tenderness: There is no abdominal tenderness.  Musculoskeletal:     Comments: Full ROM in hips, knees and shoulder, no joint redness, no swelling  Skin:    General: Skin is warm and dry.     Findings: No rash.  Neurological:     Mental Status: She is alert.  Psychiatric:        Mood and Affect: Mood is not anxious or depressed.        Speech: Speech normal.        Behavior: Behavior normal. Behavior is cooperative.        Thought Content: Thought content normal.        Judgment: Judgment normal.      Assessment and Plan   GAD (generalized anxiety disorder)  SE to several meds. Refer to counselor for cognitive behavoial therapy. ( Has seen psych in past for cognitive assessment)  Arthralgia  OA  Possible .  Treat with glucosamine and tylenol as needed.  Chronic insomnia Trail of higher dose trazodone .Marland Kitchen 50 mg at bedtime.     Eliezer Lofts, MD

## 2019-01-21 NOTE — Assessment & Plan Note (Signed)
Trail of higher dose trazodone .Marland Kitchen 50 mg at bedtime.

## 2019-01-21 NOTE — Assessment & Plan Note (Signed)
OA  Possible .  Treat with glucosamine and tylenol as needed.

## 2019-01-22 ENCOUNTER — Telehealth: Payer: Self-pay | Admitting: Family Medicine

## 2019-01-22 NOTE — Telephone Encounter (Signed)
Referral placed yesterday. I checked and I placed it at time of appotintment. Will take some tome to set up. Will forward to  Charmaine to contact pt to give her expectations on time for call back.

## 2019-01-22 NOTE — Telephone Encounter (Signed)
Patient called an stated that she is waiting on a referral for [facility] for her anxiety and depression. She stated she received a call to schedule appointment with an allergy doctor.   She wanted to follow up to see if a referral has been sent to the behavorial health office  I did not see a referral made for this on her appointment desk

## 2019-01-23 NOTE — Telephone Encounter (Signed)
Pt is on workque to be scheduled.  Behavioral Health has tried to call pt to schedule on 01-21-2019.

## 2019-02-13 ENCOUNTER — Ambulatory Visit (INDEPENDENT_AMBULATORY_CARE_PROVIDER_SITE_OTHER): Payer: PPO | Admitting: Psychology

## 2019-02-13 DIAGNOSIS — F418 Other specified anxiety disorders: Secondary | ICD-10-CM

## 2019-02-18 DIAGNOSIS — J301 Allergic rhinitis due to pollen: Secondary | ICD-10-CM | POA: Diagnosis not present

## 2019-02-18 DIAGNOSIS — J4520 Mild intermittent asthma, uncomplicated: Secondary | ICD-10-CM | POA: Diagnosis not present

## 2019-02-18 DIAGNOSIS — J3089 Other allergic rhinitis: Secondary | ICD-10-CM | POA: Diagnosis not present

## 2019-02-18 DIAGNOSIS — J3081 Allergic rhinitis due to animal (cat) (dog) hair and dander: Secondary | ICD-10-CM | POA: Diagnosis not present

## 2019-03-05 ENCOUNTER — Ambulatory Visit: Payer: Self-pay

## 2019-03-05 NOTE — Telephone Encounter (Signed)
Incoming call from Patient with complaint of feeling dizzy an light headed. Patient states that she has been lying down. Rates the dizziness as moderate .  Onset was this morning. Patient reports blood Pressure as 130/73  159//84  Heart rate 71.    Denies any other Sx.  Patient could not tell me normal blood Pressure the ranges recently are higher than normal.  Reviewed protocol with Patient recommended Patient go to Urgent Care for evaluation.  Patient voices understanding.  Sates she will go to Urgent care for evaluation.       Reason for Disposition . [1] MODERATE dizziness (e.g., interferes with normal activities) AND [2] has NOT been evaluated by physician for this  (Exception: dizziness caused by heat exposure, sudden standing, or poor fluid intake)  Answer Assessment - Initial Assessment Questions 1. DESCRIPTION: "Describe your dizziness."     *No Answer* 2. LIGHTHEADED: "Do you feel lightheaded?" (e.g., somewhat faint, woozy, weak upon standing)     Lying down 3. VERTIGO: "Do you feel like either you or the room is spinning or tilting?" (i.e. vertigo)     *No Answer* 4. SEVERITY: "How bad is it?"  "Do you feel like you are going to faint?" "Can you stand and walk?"   - MILD - walking normally   - MODERATE - interferes with normal activities (e.g., work, school)    - SEVERE - unable to stand, requires support to walk, feels like passing out now.      moderate 5. ONSET:  "When did the dizziness begin?"     This morning.   6. AGGRAVATING FACTORS: "Does anything make it worse?" (e.g., standing, change in head position)     Been lying down  7. HEART RATE: "Can you tell me your heart rate?" "How many beats in 15 seconds?"  (Note: not all patients can do this)       130/73  159/84  HR71. CAUSE: "What do you think is causing the dizziness?"     no 9. RECURRENT SYMPTOM: "Have you had dizziness before?" If so, ask: "When was the last time?" "What happened that time?"     denies 10. OTHER  SYMPTOMS: "Do you have any other symptoms?" (e.g., fever, chest pain, vomiting, diarrhea, bleeding)      denies 11. PREGNANCY: "Is there any chance you are pregnant?" "When was your last menstrual period?"       na  Protocols used: DIZZINESS [facility]

## 2019-03-06 NOTE — Telephone Encounter (Signed)
Noted  

## 2019-03-18 ENCOUNTER — Ambulatory Visit (INDEPENDENT_AMBULATORY_CARE_PROVIDER_SITE_OTHER): Payer: Self-pay | Admitting: Family Medicine

## 2019-03-18 ENCOUNTER — Telehealth: Payer: Self-pay | Admitting: Family Medicine

## 2019-03-18 ENCOUNTER — Encounter: Payer: Self-pay | Admitting: Family Medicine

## 2019-03-18 VITALS — Ht 58.75 in

## 2019-03-18 DIAGNOSIS — R0982 Postnasal drip: Secondary | ICD-10-CM

## 2019-03-18 NOTE — Progress Notes (Signed)
No answer on phone.  Reschedule.

## 2019-03-18 NOTE — Telephone Encounter (Signed)
Patient's husband called in regards to patient's appt that was today  He is requesting a call bacl

## 2019-03-19 NOTE — Telephone Encounter (Addendum)
Spoke with Mr. Allmond.  He states Mrs. Troublefield has to run out yesterday and was unable to keep her phone visit.  She has rescheduled her appointment for 03/20/2019 at 2:40 pm with Dr. Diona Browner.

## 2019-03-20 ENCOUNTER — Ambulatory Visit: Payer: PPO | Admitting: Family Medicine

## 2019-03-20 ENCOUNTER — Other Ambulatory Visit: Payer: Self-pay

## 2019-03-20 ENCOUNTER — Encounter: Payer: Self-pay | Admitting: Family Medicine

## 2019-03-20 ENCOUNTER — Ambulatory Visit (INDEPENDENT_AMBULATORY_CARE_PROVIDER_SITE_OTHER): Payer: PPO | Admitting: Family Medicine

## 2019-03-20 VITALS — Ht 58.75 in | Wt 123.5 lb

## 2019-03-20 DIAGNOSIS — R0982 Postnasal drip: Secondary | ICD-10-CM

## 2019-03-20 DIAGNOSIS — T50905A Adverse effect of unspecified drugs, medicaments and biological substances, initial encounter: Secondary | ICD-10-CM

## 2019-03-20 NOTE — Assessment & Plan Note (Signed)
If this returns try meds on a t a time to determine what helped.

## 2019-03-20 NOTE — Progress Notes (Signed)
VIRTUAL VISIT Due to national recommendations of social distancing due to [place] 19, a virtual visit is felt to be most appropriate for this patient at this time.   I connected with the patient on 03/20/19 at 11:40 AM EDT by virtual telehealth platform and verified that I am speaking with the correct person using two identifiers.   I discussed the limitations, risks, security and privacy concerns of performing an evaluation and management service by  virtual telehealth platform and the availability of in person appointments. I also discussed with the patient that there may be a patient responsible charge related to this service. The patient expressed understanding and agreed to proceed.  Patient location: Home Provider Location: [place] [place] Participants: Eliezer Lofts and [name]   Chief Complaint  Patient presents with  . Sore Throat  . Lips/Mouth Burning    History of Present Illness:    78 year old female presents for new onset sore throat and lips and mouth burning. Saw Dr. Beatris Ship last week. Started on azelastine 2 sprays in each nostril daily, ipratropium 2 sprays per nostril TID, levoceterizine daily.  She feels that immediately after starting the TID ipratropium... started having burning in her nose and throat, constantly.  No other symptoms. No SOB, no wheeze. No facial or oral swelling.   Dr. Beatris Ship told her to decrease it to twice daily, that did not help, then told to stop altogether given still issues.   Changed to olapatidine  but still was having burining so stopped  All meds 1 week ago.  Off all meds in last week but still her  mouth and throat burning.  She has been using nasal saline spray and honey to smooth.    Minimal post nasal drip at this time.. somehting seem to help it.  COVID 19 screen No recent travel or known exposure to COVID19 The patient denies respiratory symptoms of COVID 19 at this time.  The importance of social distancing was  discussed today.   ROS    Past Medical History:  Diagnosis Date  . Allergy   . Asthma   . GERD (gastroesophageal reflux disease)   . History of chicken pox   . IBS (irritable bowel syndrome)   . Migraine   . Urinary tract infection     reports that she has never smoked. She has never used smokeless tobacco. She reports that she does not drink alcohol or use drugs.   Current Outpatient Medications:  Marland Kitchen  Multiple Vitamin (MULTIVITAMIN) capsule, Take 1 capsule by mouth daily. , Disp: , Rfl:  .  albuterol (PROVENTIL HFA;VENTOLIN HFA) 108 (90 Base) MCG/ACT inhaler, Inhale 2 puffs every 6 (six) hours as needed into the lungs for wheezing or shortness of breath. (Patient not taking: Reported on 03/20/2019), Disp: 1 Inhaler, Rfl: 2 .  fluticasone (FLONASE) 50 MCG/ACT nasal spray, Place 2 sprays into both nostrils daily as needed. Reported on 09/08/2015 (Patient not taking: Reported on 03/20/2019), Disp: 16 g, Rfl: 0 .  montelukast (SINGULAIR) 10 MG tablet, Take 1 tablet (10 mg total) by mouth at bedtime. (Patient not taking: Reported on 03/20/2019), Disp: 30 tablet, Rfl: 3 .  traZODone (DESYREL) 50 MG tablet, Take 0.5-1 tablets (25-50 mg total) by mouth at bedtime as needed for sleep. (Patient not taking: Reported on 03/20/2019), Disp: 30 tablet, Rfl: 3   Observations/Objective: Height 4' 10.75" (1.492 m), weight 123 lb 8 oz (56 kg).  Physical Exam  N/A Assessment and Plan Medication side effect,  initial encounter Likely symptoms are med SE. No other respiratory concerns, no red flags for anaphylaxis.. Hard to tell which med cause issue.  Continue nasal salineand time for it to resolve.  Post-nasal drip If this returns try meds on a t a time to determine what helped.     I discussed the assessment and treatment plan with the patient. The patient was provided an opportunity to ask questions and all were answered. The patient agreed with the plan and demonstrated an understanding of the  instructions.   The patient was advised to call back or seek an in-person evaluation if the symptoms worsen or if the condition fails to improve as anticipated.  12 min OV.    Eliezer Lofts, MD

## 2019-03-20 NOTE — Assessment & Plan Note (Signed)
Likely symptoms are med SE. No other respiratory concerns, no red flags for anaphylaxis.. Hard to tell which med cause issue.  Continue nasal salineand time for it to resolve.

## 2019-03-26 ENCOUNTER — Telehealth: Payer: Self-pay | Admitting: Family Medicine

## 2019-03-26 NOTE — Telephone Encounter (Signed)
Pt received a $50 no show bill for 03/18/19. Pt had a member of her family got hit by a car and she forgot to cancel appt. She thinks under circumstances she shouldn't be charged this fee. Please advise.   CB [phone]

## 2019-03-27 NOTE — Telephone Encounter (Signed)
An e-mail has been sent to charge correction to remove the no show fee.

## 2019-03-27 NOTE — Telephone Encounter (Signed)
Please see below.

## 2019-03-27 NOTE — Telephone Encounter (Signed)
Please cancel charge

## 2019-03-31 NOTE — Telephone Encounter (Signed)
Pt aware of shavaughn's comment

## 2019-04-09 ENCOUNTER — Other Ambulatory Visit: Payer: Self-pay | Admitting: Family Medicine

## 2019-04-18 ENCOUNTER — Telehealth: Payer: Self-pay

## 2019-04-18 DIAGNOSIS — Z1231 Encounter for screening mammogram for malignant neoplasm of breast: Secondary | ICD-10-CM

## 2019-04-18 NOTE — Telephone Encounter (Signed)
Patient called and stated that she is needing to call and schedule a mammogram at [facility], but she states that they told her she needs an order for a mammogram before this can be scheduled. Can you put the order in for a mammo? Thanks!

## 2019-04-18 NOTE — Addendum Note (Signed)
Addended by: Eliezer Lofts E on: 04/18/2019 05:12 PM   Modules accepted: Orders

## 2019-05-12 ENCOUNTER — Ambulatory Visit
Admission: EM | Admit: 2019-05-12 | Discharge: 2019-05-12 | Disposition: A | Discharge status: Home / Self Care | Payer: PPO

## 2019-05-12 ENCOUNTER — Ambulatory Visit: Payer: Self-pay

## 2019-05-12 ENCOUNTER — Encounter: Payer: Self-pay | Admitting: Emergency Medicine

## 2019-05-12 ENCOUNTER — Other Ambulatory Visit: Payer: Self-pay

## 2019-05-12 DIAGNOSIS — R030 Elevated blood-pressure reading, without diagnosis of hypertension: Secondary | ICD-10-CM | POA: Diagnosis not present

## 2019-05-12 NOTE — Telephone Encounter (Signed)
Incoming call from Patient with complaint of elevated blood Pressure.   161/86 and being dizzy.   Heart rate is 94.  Patient states the tends to walk to th side when walking.  Patient request an appointment asap to have blood pressure  evaluated for  For medication.    Reason for Disposition . [1] MODERATE dizziness (e.g., interferes with normal activities) AND [2] has NOT been evaluated by physician for this  (Exception: dizziness caused by heat exposure, sudden standing, or poor fluid intake)  Answer Assessment - Initial Assessment Questions 1. DESCRIPTION: "Describe your dizziness."    Walking , walking to side.  2. LIGHTHEADED: "Do you feel lightheaded?" (e.g., somewhat faint, woozy, weak upon standing)     *No Answer* 3. VERTIGO: "Do you feel like either you or the room is spinning or tilting?" (i.e. vertigo)     *No Answer* 4. SEVERITY: "How bad is it?"  "Do you feel like you are going to faint?" "Can you stand and walk?"   - MILD - walking normally   - MODERATE - interferes with normal activities (e.g., work, school)    - SEVERE - unable to stand, requires support to walk, feels like passing out now.     Moderate.  5. ONSET:  "When did the dizziness begin?"     More that  a week ago.   6. AGGRAVATING FACTORS: "Does anything make it worse?" (e.g., standing, change in head position)    Moving around 7. HEART RATE: "Can you tell me your heart rate?" "How many beats in 15 seconds?"  (Note: not all patients can do this)      94 8. CAUSE: "What do you think is causing the dizziness?"      9. RECURRENT SYMPTOM: "Have you had dizziness before?" If so, ask: "When was the last time?" "What happened that time?"     10. OTHER SYMPTOMS: "Do you have any other symptoms?" (e.g., fever, chest pain, vomiting, diarrhea, bleeding)      Denies   11. PREGNANCY: "Is there any chance you are pregnant?" "When was your last menstrual period?"       *No Answer*  Protocols used: DIZZINESS Heidi Dach

## 2019-05-12 NOTE — Telephone Encounter (Signed)
I spoke with pt; pt wants to schedule appt due to elevated BP and dizziness. Pt said if walking pt leans toward one side or the other. This started 2 wks ago. No CP,SOB,H/A. Pt has weakness in arms for a while.rt arm is worse and pt said Dr Diona Browner thinks arthritis.no problem in walking, no slurred speech, recently see 1/2 of what she is looking at on and off; pt has not seen eye dr. Abbott Pao has hx of migraines. Pt had fever on 05/11/19 over 100 but pt cannot remember exact reading. 05/12/19 temp 97.7. Pt has no covid symptoms except fatigue, runny nose, fever yesterday,and diarrhea(loose stools) one day last wk. no travel and no known exposure to + covid. Pt has difficulty in balancing herself. Pt will go to [place]. FYI to Dr Diona Browner.

## 2019-05-12 NOTE — Telephone Encounter (Signed)
Agree. Pt need evaluation in person

## 2019-05-12 NOTE — ED Triage Notes (Addendum)
Patient in office today c/o husband checked patient bp last night and patient does not remember numbers Stated that husband checked this morning and it was 160/80  [id number]  Denies:dizziness,chest pain tingling

## 2019-05-12 NOTE — ED Provider Notes (Signed)
Jessica Hardin    CSN: [id number] Arrival date & time: 05/12/19  1252      History   Chief Complaint Chief Complaint  Patient presents with  . Hypertension    HPI Jessica Hardin is a 78 y.o. female.   Patient presents with elevated blood pressure reading when taken by her husband at home last evening.  She cannot remember the actual reading but states it was "150 or something".  She denies weakness, dizziness, headaches, chest pain, shortness of breath, or other symptoms.  No treatments attempted at home.  Patient does not have a history of hypertension.  The history is provided by the patient.    Past Medical History:  Diagnosis Date  . Allergy   . Asthma   . GERD (gastroesophageal reflux disease)   . History of chicken pox   . IBS (irritable bowel syndrome)   . Migraine   . Urinary tract infection     Patient Active Problem List   Diagnosis Date Noted  . Medication side effect, initial encounter 03/20/2019  . Arthralgia 01/21/2019  . Breast mass, right 04/29/2018  . Generalized abdominal pain 02/19/2018  . GAD (generalized anxiety disorder) 10/30/2017  . 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 Chronic insomnia 10/30/2017  . Post-nasal drip 05/04/2017  . Allergic rhinitis 10/05/2016  . Epidermoid cyst of finger of left hand 07/27/2016  . Osteopenia 11/18/2015  . Chronic constipation 07/20/2015  . Internal hemorrhoid, bleeding 07/20/2015  . 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 Counseling regarding end of life decision making 09/08/2014  . Childhood asthma 08/13/2014  . Gastroesophageal reflux disease without esophagitis 08/13/2014  . Irritable bowel syndrome with constipation 08/13/2014  . Other seasonal allergic rhinitis 08/13/2014  . Hx of migraines 08/13/2014  . Family history of colon cancer 08/13/2014  . Family history of uterine cancer 08/13/2014  . Family history of breast cancer 08/13/2014    Past Surgical History:   Procedure Laterality Date  . ABDOMINAL HYSTERECTOMY    . CATARACT EXTRACTION, BILATERAL    . TUBAL LIGATION      OB History   No obstetric history on file.      Home Medications    Prior to Admission medications   Medication Sig Start Date End Date Taking? Authorizing Provider  albuterol (PROVENTIL HFA;VENTOLIN HFA) 108 (90 Base) MCG/ACT inhaler Inhale 2 puffs every 6 (six) hours as needed into the lungs for wheezing or shortness of breath. 04/27/17  Yes Merlyn Lot, MD  fluticasone [facility]) 50 MCG/ACT nasal spray Place 2 sprays into both nostrils daily as needed. Reported on 09/08/2015 09/08/15  Yes Tonia Ghent, MD  montelukast (SINGULAIR) 10 MG tablet Take 1 tablet (10 mg total) by mouth at bedtime. 10/03/18  Yes Bedsole, Amy E, MD  Multiple Vitamin (MULTIVITAMIN) capsule Take 1 capsule by mouth daily.    Yes [provider]  traZODone (DESYREL) 50 MG tablet Take 0.5-1 tablets (25-50 mg total) by mouth at bedtime as needed for sleep. 12/03/18  Yes Jinny Sanders, MD    Family History Family History  Problem Relation Age of Onset  . Arthritis Mother   . Hypertension Mother   . Arthritis Sister   . Hypertension Sister   . Colon cancer Brother 32  . Breast cancer Sister   . Breast cancer Sister   . Uterine cancer Sister 31  . Multiple sclerosis Daughter     Social History Social History   Tobacco Use  .  Smoking status: Never Smoker  . Smokeless tobacco: Never Used  Substance Use Topics  . Alcohol use: No    Alcohol/week: 0.0 standard drinks  . Drug use: No     Allergies   Shellfish allergy   Review of Systems Review of Systems  Constitutional: Negative for chills and fever.  HENT: Negative for ear pain and sore throat.   Eyes: Negative for pain and visual disturbance.  Respiratory: Negative for cough and shortness of breath.   Cardiovascular: Negative for chest pain, palpitations and leg swelling.  Gastrointestinal: Negative for abdominal  pain and vomiting.  Genitourinary: Negative for dysuria and hematuria.  Musculoskeletal: Negative for arthralgias and back pain.  Skin: Negative for color change and rash.  Neurological: Negative for dizziness, tremors, seizures, syncope, facial asymmetry, speech difficulty, weakness, light-headedness, numbness and headaches.  All other systems reviewed and are negative.    Physical Exam Triage Vital Signs ED Triage Vitals  Enc Vitals Group     BP      Pulse      Resp      Temp      Temp src      SpO2      Weight      Height      Head Circumference      Peak Flow      Pain Score      Pain Loc      Pain Edu?      Excl. in [place]?    No data found.  Updated Vital Signs BP (!) 165/83 (BP Location: Left Arm)   Pulse 82   Temp 98.1 F (36.7 C) (Oral)   Resp 18   Wt 122 lb (55.3 kg)   SpO2 97%   BMI 24.85 kg/m   Visual Acuity Right Eye Distance:   Left Eye Distance:   Bilateral Distance:    Right Eye Near:   Left Eye Near:    Bilateral Near:     Physical Exam Vitals signs and nursing note reviewed.  Constitutional:      General: She is not in acute distress.    Appearance: She is well-developed. She is not ill-appearing.  HENT:     Head: Normocephalic and atraumatic.     Mouth/Throat:     Mouth: Mucous membranes are moist.     Pharynx: Oropharynx is clear.  Eyes:     Conjunctiva/sclera: Conjunctivae normal.  Neck:     Musculoskeletal: Neck supple.  Cardiovascular:     Rate and Rhythm: Normal rate and regular rhythm.     Heart sounds: Normal heart sounds. No murmur.  Pulmonary:     Effort: Pulmonary effort is normal. No respiratory distress.     Breath sounds: Normal breath sounds.  Abdominal:     General: Bowel sounds are normal.     Palpations: Abdomen is soft.     Tenderness: There is no abdominal tenderness. There is no guarding or rebound.  Musculoskeletal:     Right lower leg: No edema.     Left lower leg: No edema.  Skin:    General: Skin is  warm and dry.  Neurological:     General: No focal deficit present.     Mental Status: She is alert and oriented to person, place, and time.     Cranial Nerves: No cranial nerve deficit.     Sensory: No sensory deficit.     Motor: No weakness.     Coordination: Coordination normal.  Gait: Gait normal.     Deep Tendon Reflexes: Reflexes normal.      UC Treatments / Results  Labs (all labs ordered are listed, but only abnormal results are displayed) Labs Reviewed - No data to display  EKG   Radiology No results found.  Procedures Procedures (including critical care time)  Medications Ordered in UC Medications - No data to display  Initial Impression / Assessment and Plan / UC Course  I have reviewed the triage vital signs and the nursing notes.  Pertinent labs & imaging results that were available during my care of the patient were reviewed by me and considered in my medical decision making (see chart for details).    Elevated blood pressure reading without known hypertension.  Instructed patient to have her blood pressure rechecked by her PCP in 2 to 4 weeks.  Discussed that she should keep a blood pressure log at home and take this to her PCP at her next visit.  Discussed low-sodium diet.  Instructed patient to go to the ED if she has weakness, dizziness, headaches, chest pain, shortness of breath, or other symptoms.  Patient agrees to plan of care.     Final Clinical Impressions(s) / UC Diagnoses   Final diagnoses:  Elevated blood pressure reading without diagnosis of hypertension     Discharge Instructions     Your blood pressure is elevated today at 165/83.  Please have this rechecked by your primary care provider in 2-4 weeks.    Keep a log of your blood pressure reading at home.  Take this to your primary care provider at your next visit.    Follow a low sodium diet.    Go to the emergency department if you have weakness, dizziness, headaches, chest  pain, shortness of breath, or other symptoms.       ED Prescriptions    None     PDMP not reviewed this encounter.   Sharion Balloon, NP 05/12/19 1325

## 2019-05-12 NOTE — Discharge Instructions (Addendum)
Your blood pressure is elevated today at 165/83.  Please have this rechecked by your primary care provider in 2-4 weeks.    Keep a log of your blood pressure reading at home.  Take this to your primary care provider at your next visit.    Follow a low sodium diet.    Go to the emergency department if you have weakness, dizziness, headaches, chest pain, shortness of breath, or other symptoms.

## 2019-05-14 ENCOUNTER — Other Ambulatory Visit: Payer: Self-pay

## 2019-05-14 ENCOUNTER — Encounter: Payer: Self-pay | Admitting: Family Medicine

## 2019-05-14 ENCOUNTER — Telehealth: Payer: Self-pay

## 2019-05-14 ENCOUNTER — Ambulatory Visit (INDEPENDENT_AMBULATORY_CARE_PROVIDER_SITE_OTHER): Payer: PPO | Admitting: Family Medicine

## 2019-05-14 DIAGNOSIS — I10 Essential (primary) hypertension: Secondary | ICD-10-CM

## 2019-05-14 MED ORDER — AMLODIPINE BESYLATE 2.5 MG PO TABS: 2.5000 mg | ORAL_TABLET | Freq: Every day | ORAL | 3 refills | Status: DC

## 2019-05-14 NOTE — Telephone Encounter (Signed)
Pt had UC visit on 05/12/19 and BP was high; today BP 168/86 P 74 T 98.1 pt is not dizzy but is off balance;jittery but pt said she is anxious about elevated BP; No CP,OSB,H/A. Pt has no covid symptoms, no travel and no known exposure to + covid. Pt has in office appt today with Dr Darnell Level at 3pm. Pt will be at office at 2:25 to be cked in. [facility] & ED precautions given and pt voiced understanding.FYI to Dr Darnell Level.

## 2019-05-14 NOTE — Telephone Encounter (Signed)
Will see today.  

## 2019-05-14 NOTE — Patient Instructions (Addendum)
Schedule eye exam.  Blood pressure is staying a little elevated. Start amlodipine 2.5mg  daily - sent to pharmacy.   Your goal blood pressure is <140/90. Work on low salt/sodium diet - goal <1.5gm (1,500mg ) per day. Eat a diet high in fruits/vegetables and whole grains.  Look into mediterranean and DASH diet. Goal activity is 164min/wk of moderate intensity exercise.  This can be split into 30 minute chunks.  If you are not at this level, you can start with smaller 10-15 min increments and slowly build up activity. Look at [place].org for more resources   DASH Eating Plan DASH stands for "Dietary Approaches to Stop Hypertension." The DASH eating plan is a healthy eating plan that has been shown to reduce high blood pressure (hypertension). It may also reduce your risk for type 2 diabetes, heart disease, and stroke. The DASH eating plan may also help with weight loss. What are tips for following this plan?  General guidelines  Avoid eating more than 2,300 mg (milligrams) of salt (sodium) a day. If you have hypertension, you may need to reduce your sodium intake to 1,500 mg a day.  Limit alcohol intake to no more than 1 drink a day for nonpregnant women and 2 drinks a day for men. One drink equals 12 oz of beer, 5 oz of wine, or 1 oz of hard liquor.  Work with your health care provider to maintain a healthy body weight or to lose weight. Ask what an ideal weight is for you.  Get at least 30 minutes of exercise that causes your heart to beat faster (aerobic exercise) most days of the week. Activities may include walking, swimming, or biking.  Work with your health care provider or diet and nutrition specialist (dietitian) to adjust your eating plan to your individual calorie needs. Reading food labels   Check food labels for the amount of sodium per serving. Choose foods with less than 5 percent of the Daily Value of sodium. Generally, foods with less than 300 mg of sodium per serving fit  into this eating plan.  To find whole grains, look for the word "whole" as the first word in the ingredient list. Shopping  Buy products labeled as "low-sodium" or "no salt added."  Buy fresh foods. Avoid canned foods and premade or frozen meals. Cooking  Avoid adding salt when cooking. Use salt-free seasonings or herbs instead of table salt or sea salt. Check with your health care provider or pharmacist before using salt substitutes.  Do not fry foods. Cook foods using healthy methods such as baking, boiling, grilling, and broiling instead.  Cook with heart-healthy oils, such as olive, canola, soybean, or sunflower oil. Meal planning  Eat a balanced diet that includes: ? 5 or more servings of fruits and vegetables each day. At each meal, try to fill half of your plate with fruits and vegetables. ? Up to 6-8 servings of whole grains each day. ? Less than 6 oz of lean meat, poultry, or fish each day. A 3-oz serving of meat is about the same size as a deck of cards. One egg equals 1 oz. ? 2 servings of low-fat dairy each day. ? A serving of nuts, seeds, or beans 5 times each week. ? Heart-healthy fats. Healthy fats called Omega-3 fatty acids are found in foods such as flaxseeds and coldwater fish, like sardines, salmon, and mackerel.  Limit how much you eat of the following: ? Canned or prepackaged foods. ? Food that is high in trans  fat, such as fried foods. ? Food that is high in saturated fat, such as fatty meat. ? Sweets, desserts, sugary drinks, and other foods with added sugar. ? Full-fat dairy products.  Do not salt foods before eating.  Try to eat at least 2 vegetarian meals each week.  Eat more home-cooked food and less restaurant, buffet, and fast food.  When eating at a restaurant, ask that your food be prepared with less salt or no salt, if possible. What foods are recommended? The items listed may not be a complete list. Talk with your dietitian about what dietary  choices are best for you. Grains Whole-grain or whole-wheat bread. Whole-grain or whole-wheat pasta. Brown rice. Jessica Hardin. Bulgur. Whole-grain and low-sodium cereals. Pita bread. Low-fat, low-sodium crackers. Whole-wheat flour tortillas. Vegetables Fresh or frozen vegetables (raw, steamed, roasted, or grilled). Low-sodium or reduced-sodium tomato and vegetable juice. Low-sodium or reduced-sodium tomato sauce and tomato paste. Low-sodium or reduced-sodium canned vegetables. Fruits All fresh, dried, or frozen fruit. Canned fruit in natural juice (without added sugar). Meat and other protein foods Skinless chicken or [place]. Ground chicken or [place]. Pork with fat trimmed off. Fish and seafood. Egg whites. Dried beans, peas, or lentils. Unsalted nuts, nut butters, and seeds. Unsalted canned beans. Lean cuts of beef with fat trimmed off. Low-sodium, lean deli meat. Dairy Low-fat (1%) or fat-free (skim) milk. Fat-free, low-fat, or reduced-fat cheeses. Nonfat, low-sodium ricotta or cottage cheese. Low-fat or nonfat yogurt. Low-fat, low-sodium cheese. Fats and oils Soft margarine without trans fats. Vegetable oil. Low-fat, reduced-fat, or light mayonnaise and salad dressings (reduced-sodium). Canola, safflower, olive, soybean, and sunflower oils. Avocado. Seasoning and other foods Herbs. Spices. Seasoning mixes without salt. Unsalted popcorn and pretzels. Fat-free sweets. What foods are not recommended? The items listed may not be a complete list. Talk with your dietitian about what dietary choices are best for you. Grains Baked goods made with fat, such as croissants, muffins, or some breads. Dry pasta or rice meal packs. Vegetables Creamed or fried vegetables. Vegetables in a cheese sauce. Regular canned vegetables (not low-sodium or reduced-sodium). Regular canned tomato sauce and paste (not low-sodium or reduced-sodium). Regular tomato and vegetable juice (not low-sodium or reduced-sodium).  Jessica Hardin. Olives. Fruits Canned fruit in a light or heavy syrup. Fried fruit. Fruit in cream or butter sauce. Meat and other protein foods Fatty cuts of meat. Ribs. Fried meat. Jessica Hardin. Sausage. Bologna and other processed lunch meats. Salami. Fatback. Hotdogs. Bratwurst. Salted nuts and seeds. Canned beans with added salt. Canned or smoked fish. Whole eggs or egg yolks. Chicken or [place] with skin. Dairy Whole or 2% milk, cream, and half-and-half. Whole or full-fat cream cheese. Whole-fat or sweetened yogurt. Full-fat cheese. Nondairy creamers. Whipped toppings. Processed cheese and cheese spreads. Fats and oils Butter. Stick margarine. Lard. Shortening. Ghee. Bacon fat. Tropical oils, such as coconut, palm kernel, or palm oil. Seasoning and other foods Salted popcorn and pretzels. Onion salt, garlic salt, seasoned salt, table salt, and sea salt. Worcestershire sauce. Tartar sauce. Barbecue sauce. Teriyaki sauce. Soy sauce, including reduced-sodium. Steak sauce. Canned and packaged gravies. Fish sauce. Oyster sauce. Cocktail sauce. Horseradish that you find on the shelf. Ketchup. Mustard. Meat flavorings and tenderizers. Bouillon cubes. Hot sauce and Tabasco sauce. Premade or packaged marinades. Premade or packaged taco seasonings. Relishes. Regular salad dressings. Where to find more information:  National Heart, Lung, and [place]: https://wilson-eaton.com/  American Heart Association: www.heart.org Summary  The DASH eating plan is a healthy eating plan that has  been shown to reduce high blood pressure (hypertension). It may also reduce your risk for type 2 diabetes, heart disease, and stroke.  With the DASH eating plan, you should limit salt (sodium) intake to 2,300 mg a day. If you have hypertension, you may need to reduce your sodium intake to 1,500 mg a day.  When on the DASH eating plan, aim to eat more fresh fruits and vegetables, whole grains, lean proteins, low-fat dairy, and  heart-healthy fats.  Work with your health care provider or diet and nutrition specialist (dietitian) to adjust your eating plan to your individual calorie needs. This information is not intended to replace advice given to you by your health care provider. Make sure you discuss any questions you have with your health care provider. Document Released: 05/25/2011 Document Revised: 05/18/2017 Document Reviewed: 05/29/2016 Elsevier Patient Education  2020 [employer].

## 2019-05-14 NOTE — Progress Notes (Signed)
This visit was conducted in person.  BP (!) 150/90 (BP Location: Right Arm, Cuff Size: Normal)   Pulse 100   Temp 97.7 F (36.5 C) (Temporal)   Ht 4' 10.75" (1.492 m)   Wt 121 lb 6 oz (55.1 kg)   SpO2 98%   BMI 24.72 kg/m   BP Readings from Last 3 Encounters:  05/14/19 (!) 150/90  05/12/19 (!) 165/83  01/21/19 130/70    CC: elevated blood pressures Subjective:    Patient ID: Jessica Hardin, female    DOB: 10/26/1940, 78 y.o.   MRN: [id number]  HPI: Jessica Hardin is a 78 y.o. female presenting on 05/14/2019 for Elevated Blood Pressure (C/o elevated BP due to anxiety concerning COVID-19.  )   Seen at [facility] on Monday 05/12/2019 with elevated blood pressure readings at home [id number] systolic. Overall feeling well. Note reviewed. BP at [facility] was also [id number] systolic.   Increased stress due to covid pandemic.  Denies HA, vision changes, CP/tightness, SOB, leg swelling.  No dizziness, presyncope.   She does have fmhx HTN (mother and sisters)  She does have arm cuff at home. Home readings were previously 120/70s, but with increased Covid stress noticing higher readings over the last 2 weeks.      Relevant past medical, surgical, family and social history reviewed and updated as indicated. Interim medical history since our last visit reviewed. Allergies and medications reviewed and updated. Outpatient Medications Prior to Visit  Medication Sig Dispense Refill  . montelukast (SINGULAIR) 10 MG tablet Take 1 tablet (10 mg total) by mouth at bedtime. 30 tablet 3  . Multiple Vitamin (MULTIVITAMIN) capsule Take 1 capsule by mouth daily.     . traZODone (DESYREL) 50 MG tablet Take 0.5-1 tablets (25-50 mg total) by mouth at bedtime as needed for sleep. 30 tablet 3  . albuterol (PROVENTIL HFA;VENTOLIN HFA) 108 (90 Base) MCG/ACT inhaler Inhale 2 puffs every 6 (six) hours as needed into the lungs for wheezing or shortness of breath. 1 Inhaler 2  . fluticasone (FLONASE) 50 MCG/ACT nasal spray  Place 2 sprays into both nostrils daily as needed. Reported on 09/08/2015 16 g 0   No facility-administered medications prior to visit.      Per HPI unless specifically indicated in ROS section below Review of Systems Objective:    BP (!) 150/90 (BP Location: Right Arm, Cuff Size: Normal)   Pulse 100   Temp 97.7 F (36.5 C) (Temporal)   Ht 4' 10.75" (1.492 m)   Wt 121 lb 6 oz (55.1 kg)   SpO2 98%   BMI 24.72 kg/m   Wt Readings from Last 3 Encounters:  05/14/19 121 lb 6 oz (55.1 kg)  05/12/19 122 lb (55.3 kg)  03/20/19 123 lb 8 oz (56 kg)    Physical Exam Vitals signs and nursing note reviewed.  Constitutional:      General: She is not in acute distress.    Appearance: Normal appearance. She is not ill-appearing.  Eyes:     Extraocular Movements: Extraocular movements intact.     Conjunctiva/sclera: Conjunctivae normal.     Pupils: Pupils are equal, round, and reactive to light.  Neck:     Thyroid: No thyromegaly or thyroid tenderness.  Cardiovascular:     Rate and Rhythm: Normal rate and regular rhythm.     Pulses: Normal pulses.     Heart sounds: Normal heart sounds. No murmur.  Pulmonary:     Effort: Pulmonary effort is normal. No  respiratory distress.     Breath sounds: Normal breath sounds. No wheezing, rhonchi or rales.  Musculoskeletal:     Right lower leg: No edema.     Left lower leg: No edema.  Neurological:     Mental Status: She is alert.  Psychiatric:        Mood and Affect: Mood normal.        Behavior: Behavior normal.       Assessment & Plan:  This visit occurred during the SARS-CoV-2 public health emergency.  Safety protocols were in place, including screening questions prior to the visit, additional usage of staff PPE, and extensive cleaning of exam room while observing appropriate contact time as indicated for disinfecting solutions.   Problem List Items Addressed This Visit    Essential hypertension    Elevated readings in setting of  increased stress from Covid pandemic. BP staying mildly elevated in office today. Reviewed dietary choices to control blood pressure, DASH diet provided today. Encouraged regular walking routine for regular aerobic exercise to help decrease blood pressure. Reviewed optimal strategy to check blood pressures. Reviewed definition of hypertension as well as reasons to treat hypertension.  Offered low dose antihypertensive hopefully temporary course - she does state she would feel better to start on medication today with close PCP f/u - encouraged return in 3-4 wks for PCP f/u. Reviewed side effects of amlodipine to watch for.       Relevant Medications   amLODipine (NORVASC) 2.5 MG tablet       Meds ordered this encounter  Medications  . amLODipine (NORVASC) 2.5 MG tablet    Sig: Take 1 tablet (2.5 mg total) by mouth daily.    Dispense:  30 tablet    Refill:  3   No orders of the defined types were placed in this encounter.  Patient instructions: Schedule eye exam.  Blood pressure is staying a little elevated. Start amlodipine 2.5mg  daily - sent to pharmacy.   Your goal blood pressure is <140/90. Work on low salt/sodium diet - goal <1.5gm (1,500mg ) per day. Eat a diet high in fruits/vegetables and whole grains.  Look into mediterranean and DASH diet. Goal activity is 165min/wk of moderate intensity exercise.  This can be split into 30 minute chunks.  If you are not at this level, you can start with smaller 10-15 min increments and slowly build up activity. Look at [facility] for more resources  Follow up plan: Return in about 4 weeks (around 06/11/2019) for follow up visit.  Ria Bush, MD

## 2019-05-14 NOTE — Assessment & Plan Note (Addendum)
Elevated readings in setting of increased stress from Covid pandemic. BP staying mildly elevated in office today. Reviewed dietary choices to control blood pressure, DASH diet provided today. Encouraged regular walking routine for regular aerobic exercise to help decrease blood pressure. Reviewed optimal strategy to check blood pressures. Reviewed definition of hypertension as well as reasons to treat hypertension.  Offered low dose antihypertensive hopefully temporary course - she does state she would feel better to start on medication today with close PCP f/u - encouraged return in 3-4 wks for PCP f/u. Reviewed side effects of amlodipine to watch for.

## 2019-05-22 ENCOUNTER — Encounter: Payer: Self-pay | Admitting: Family Medicine

## 2019-05-22 ENCOUNTER — Other Ambulatory Visit: Payer: Self-pay

## 2019-05-22 ENCOUNTER — Telehealth: Payer: Self-pay

## 2019-05-22 ENCOUNTER — Ambulatory Visit (INDEPENDENT_AMBULATORY_CARE_PROVIDER_SITE_OTHER): Payer: PPO | Admitting: Family Medicine

## 2019-05-22 ENCOUNTER — Other Ambulatory Visit: Payer: Self-pay | Admitting: Family Medicine

## 2019-05-22 VITALS — BP 150/80 | HR 70 | Temp 98.0°F | Ht 58.75 in | Wt 121.8 lb

## 2019-05-22 DIAGNOSIS — F411 Generalized anxiety disorder: Secondary | ICD-10-CM | POA: Diagnosis not present

## 2019-05-22 DIAGNOSIS — I10 Essential (primary) hypertension: Secondary | ICD-10-CM

## 2019-05-22 MED ORDER — HYDROXYZINE HCL 10 MG PO TABS: 10.0000 mg | ORAL_TABLET | Freq: Three times a day (TID) | ORAL | 0 refills | Status: DC | PRN

## 2019-05-22 NOTE — Patient Instructions (Addendum)
Start atarax as needed  3 times daily for panic spells.  Continue other medications.  Work on stress reduction , relaxation.

## 2019-05-22 NOTE — Telephone Encounter (Signed)
Patient called asking about her anxiety medication that was suppose to be started today by Dr Diona Browner. It does not look like it was sent in yet. Please review.

## 2019-05-22 NOTE — Assessment & Plan Note (Addendum)
Will try using atarax as needed for panic attacks given SE to other meds in past and patient not wanting to be on an everyday 24 hour medication. Close follow up in 2 weeks.

## 2019-05-22 NOTE — Progress Notes (Signed)
Rx has now been sent

## 2019-05-22 NOTE — Progress Notes (Signed)
Chief Complaint  Patient presents with  . Follow-up    Anxiety    History of Present Illness: HPI  78 year old female presents for anxiety. She has been more stressed lately in setting of [place].  She was recently seen for HTN.. started on amlodipine 2.5 mg daily. NO SE .  In past GAD has been treated with SSRI (sertraline, remeron) and psychotherapy ( did not help)   She has been having increased spells.. panicky, shaky and anxious... lasts  15-20 min. HAs been noting more in last several weeks Not as bad as when she is out in public. Per pt BP seems to be associated with anxiety worsening.. she has not had issues with is before.  GAD 14/27   BP increases as well. She try calm down, relax, praying.. this helps.  BP Readings from Last 3 Encounters:  05/22/19 (!) 150/80  05/14/19 (!) 150/90  05/12/19 (!) 165/83   Using trazodone for sleep at night. This helps her go to sleep and stay asleep.   This visit occurred during the SARS-CoV-2 public health emergency.  Safety protocols were in place, including screening questions prior to the visit, additional usage of staff PPE, and extensive cleaning of exam room while observing appropriate contact time as indicated for disinfecting solutions.   COVID 19 screen:  No recent travel or known exposure to COVID19 The patient denies respiratory symptoms of COVID 19 at this time. The importance of social distancing was discussed today.     Review of Systems  Constitutional: Negative for chills and fever.  HENT: Negative for congestion and ear pain.   Eyes: Negative for pain and redness.  Respiratory: Negative for cough and shortness of breath.   Cardiovascular: Negative for chest pain, palpitations and leg swelling.  Gastrointestinal: Negative for abdominal pain, blood in stool, constipation, diarrhea, nausea and vomiting.  Genitourinary: Negative for dysuria.  Musculoskeletal: Negative for falls and myalgias.  Skin: Negative for  rash.  Neurological: Negative for dizziness.  Psychiatric/Behavioral: Negative for depression. The patient is nervous/anxious.       Past Medical History:  Diagnosis Date  . Allergy   . Asthma   . GERD (gastroesophageal reflux disease)   . History of chicken pox   . IBS (irritable bowel syndrome)   . Migraine   . Urinary tract infection     reports that she has never smoked. She has never used smokeless tobacco. She reports that she does not drink alcohol or use drugs.   Current Outpatient Medications:  .  amLODipine (NORVASC) 2.5 MG tablet, Take 1 tablet (2.5 mg total) by mouth daily., Disp: 30 tablet, Rfl: 3 .  montelukast (SINGULAIR) 10 MG tablet, Take 1 tablet (10 mg total) by mouth at bedtime., Disp: 30 tablet, Rfl: 3 .  Multiple Vitamin (MULTIVITAMIN) capsule, Take 1 capsule by mouth daily. , Disp: , Rfl:  .  traZODone (DESYREL) 50 MG tablet, Take 0.5-1 tablets (25-50 mg total) by mouth at bedtime as needed for sleep., Disp: 30 tablet, Rfl: 3   Observations/Objective: Blood pressure (!) 150/80, pulse 70, temperature 98 F (36.7 C), temperature source Temporal, height 4' 10.75" (1.492 m), weight 121 lb 12 oz (55.2 kg), SpO2 98 %.  Physical Exam Constitutional:      General: She is not in acute distress.    Appearance: Normal appearance. She is well-developed. She is not ill-appearing or toxic-appearing.  HENT:     Head: Normocephalic.     Right Ear: Hearing, tympanic membrane,  ear canal and external ear normal. Tympanic membrane is not erythematous, retracted or bulging.     Left Ear: Hearing, tympanic membrane, ear canal and external ear normal. Tympanic membrane is not erythematous, retracted or bulging.     Nose: No mucosal edema or rhinorrhea.     Right Sinus: No maxillary sinus tenderness or frontal sinus tenderness.     Left Sinus: No maxillary sinus tenderness or frontal sinus tenderness.     Mouth/Throat:     Pharynx: Uvula midline.  Eyes:     General: Lids are  normal. Lids are everted, no foreign bodies appreciated.     Conjunctiva/sclera: Conjunctivae normal.     Pupils: Pupils are equal, round, and reactive to light.  Neck:     Musculoskeletal: Normal range of motion and neck supple.     Thyroid: No thyroid mass or thyromegaly.     Vascular: No carotid bruit.     Trachea: Trachea normal.  Cardiovascular:     Rate and Rhythm: Normal rate and regular rhythm.     Pulses: Normal pulses.     Heart sounds: Normal heart sounds, S1 normal and S2 normal. No murmur. No friction rub. No gallop.   Pulmonary:     Effort: Pulmonary effort is normal. No tachypnea or respiratory distress.     Breath sounds: Normal breath sounds. No decreased breath sounds, wheezing, rhonchi or rales.  Abdominal:     General: Bowel sounds are normal.     Palpations: Abdomen is soft.     Tenderness: There is no abdominal tenderness.  Skin:    General: Skin is warm and dry.     Findings: No rash.  Neurological:     Mental Status: She is alert.  Psychiatric:        Mood and Affect: Mood is anxious. Mood is not depressed.        Speech: Speech normal.        Behavior: Behavior is agitated. Behavior is cooperative.        Thought Content: Thought content normal.        Judgment: Judgment normal.      Assessment and Plan GAD (generalized anxiety disorder)  Will try using atarax as needed for panic attacks given SE to other meds in past and patient not wanting to be on an everyday 24 hour medication. Close follow up in 2 weeks.  Essential hypertension Continue low dose amlodipine. Likely related to anxiety. Consider med adjustment if BP remaining elevated despite improvement in mood.       Eliezer Lofts, MD

## 2019-05-22 NOTE — Telephone Encounter (Signed)
Let pt know Rx now sent in.

## 2019-05-22 NOTE — Assessment & Plan Note (Signed)
Continue low dose amlodipine. Likely related to anxiety. Consider med adjustment if BP remaining elevated despite improvement in mood.

## 2019-05-22 NOTE — Telephone Encounter (Signed)
Mr. Gillam notified that Rx for hydroxyzine has been sent to [facility] on [address].

## 2019-06-06 ENCOUNTER — Other Ambulatory Visit: Payer: Self-pay

## 2019-06-06 ENCOUNTER — Ambulatory Visit (INDEPENDENT_AMBULATORY_CARE_PROVIDER_SITE_OTHER): Payer: PPO | Admitting: Family Medicine

## 2019-06-06 ENCOUNTER — Encounter: Payer: Self-pay | Admitting: Family Medicine

## 2019-06-06 VITALS — BP 140/70 | HR 71 | Temp 98.0°F | Ht 58.75 in | Wt 123.0 lb

## 2019-06-06 DIAGNOSIS — I10 Essential (primary) hypertension: Secondary | ICD-10-CM

## 2019-06-06 DIAGNOSIS — F320 Major depressive disorder, single episode, mild: Secondary | ICD-10-CM | POA: Diagnosis not present

## 2019-06-06 DIAGNOSIS — F411 Generalized anxiety disorder: Secondary | ICD-10-CM | POA: Diagnosis not present

## 2019-06-06 NOTE — Progress Notes (Signed)
Chief Complaint  Patient presents with  . Follow-up    Blood Pressure    History of Present Illness: HPI  78 year old female presents for follow up BP and GAD  Hypertension:   BPs had been higher recently... she was started on amlodipine low dose.. Felt BP was very closely related to anxiety At last OV on 05/22/2019 atarax was start as needed for panic attacks.  Walking regualr exercise Using medication without problems or lightheadedness: none Chest pain with exertion: none Edema:none Short of breath:none Average home BPs: 130/70s Other issues:  BP Readings from Last 3 Encounters:  06/06/19 140/70  05/22/19 (!) 150/80  05/14/19 (!) 150/90     This visit occurred during the SARS-CoV-2 public health emergency.  Safety protocols were in place, including screening questions prior to the visit, additional usage of staff PPE, and extensive cleaning of exam room while observing appropriate contact time as indicated for disinfecting solutions.   COVID 19 screen:  No recent travel or known exposure to COVID19 The patient denies respiratory symptoms of COVID 19 at this time. The importance of social distancing was discussed today.     Review of Systems  Constitutional: Negative for chills and fever.  HENT: Negative for congestion and ear pain.   Eyes: Negative for pain and redness.  Respiratory: Negative for cough and shortness of breath.   Cardiovascular: Negative for chest pain, palpitations and leg swelling.  Gastrointestinal: Negative for abdominal pain, blood in stool, constipation, diarrhea, nausea and vomiting.  Genitourinary: Negative for dysuria.  Musculoskeletal: Negative for falls and myalgias.  Skin: Negative for rash.  Neurological: Negative for dizziness.  Psychiatric/Behavioral: Negative for depression. The patient is not nervous/anxious.       Past Medical History:  Diagnosis Date  . Allergy   . Asthma   . GERD (gastroesophageal reflux disease)   .  History of chicken pox   . IBS (irritable bowel syndrome)   . Migraine   . Urinary tract infection     reports that she has never smoked. She has never used smokeless tobacco. She reports that she does not drink alcohol or use drugs.   Current Outpatient Medications:  .  amLODipine (NORVASC) 2.5 MG tablet, Take 1 tablet (2.5 mg total) by mouth daily., Disp: 30 tablet, Rfl: 3 .  hydrOXYzine (ATARAX/VISTARIL) 10 MG tablet, Take 1 tablet (10 mg total) by mouth 3 (three) times daily as needed., Disp: 30 tablet, Rfl: 0 .  montelukast (SINGULAIR) 10 MG tablet, Take 1 tablet (10 mg total) by mouth at bedtime., Disp: 30 tablet, Rfl: 3 .  Multiple Vitamin (MULTIVITAMIN) capsule, Take 1 capsule by mouth daily. , Disp: , Rfl:  .  traZODone (DESYREL) 50 MG tablet, Take 0.5-1 tablets (25-50 mg total) by mouth at bedtime as needed for sleep., Disp: 30 tablet, Rfl: 3   Observations/Objective: Blood pressure 140/70, pulse 71, temperature 98 F (36.7 C), temperature source Temporal, height 4' 10.75" (1.492 m), weight 123 lb (55.8 kg), SpO2 98 %.  Physical Exam Constitutional:      General: She is not in acute distress.    Appearance: Normal appearance. She is well-developed. She is not ill-appearing or toxic-appearing.  HENT:     Head: Normocephalic.     Right Ear: Hearing, tympanic membrane, ear canal and external ear normal. Tympanic membrane is not erythematous, retracted or bulging.     Left Ear: Hearing, tympanic membrane, ear canal and external ear normal. Tympanic membrane is not erythematous, retracted or  bulging.     Nose: No mucosal edema or rhinorrhea.     Right Sinus: No maxillary sinus tenderness or frontal sinus tenderness.     Left Sinus: No maxillary sinus tenderness or frontal sinus tenderness.     Mouth/Throat:     Pharynx: Uvula midline.  Eyes:     General: Lids are normal. Lids are everted, no foreign bodies appreciated.     Conjunctiva/sclera: Conjunctivae normal.     Pupils:  Pupils are equal, round, and reactive to light.  Neck:     Thyroid: No thyroid mass or thyromegaly.     Vascular: No carotid bruit.     Trachea: Trachea normal.  Cardiovascular:     Rate and Rhythm: Normal rate and regular rhythm.     Pulses: Normal pulses.     Heart sounds: Normal heart sounds, S1 normal and S2 normal. No murmur. No friction rub. No gallop.   Pulmonary:     Effort: Pulmonary effort is normal. No tachypnea or respiratory distress.     Breath sounds: Normal breath sounds. No decreased breath sounds, wheezing, rhonchi or rales.  Abdominal:     General: Bowel sounds are normal.     Palpations: Abdomen is soft.     Tenderness: There is no abdominal tenderness.  Musculoskeletal:     Cervical back: Normal range of motion and neck supple.  Skin:    General: Skin is warm and dry.     Findings: No rash.  Neurological:     Mental Status: She is alert.  Psychiatric:        Mood and Affect: Mood is not anxious or depressed.        Speech: Speech normal.        Behavior: Behavior normal. Behavior is cooperative.        Thought Content: Thought content normal.        Judgment: Judgment normal.      Assessment and Plan   Essential hypertension IMproved control on amlodipine low dose as well as with improved control of anxiety.   GAD (generalized anxiety disorder) Using relaxation techniques and atarax prn.  Given sleepy feeling with full dose.. she can try 1/2 tab if it is needed during the day.  Pt instructed to not use at night is also using sleeping medication.     Eliezer Lofts, MD

## 2019-06-06 NOTE — Assessment & Plan Note (Signed)
Using relaxation techniques and atarax prn.  Given sleepy feeling with full dose.. she can try 1/2 tab if it is needed during the day.  Pt instructed to not use at night is also using sleeping medication.

## 2019-06-06 NOTE — Assessment & Plan Note (Signed)
IMproved control on amlodipine low dose as well as with improved control of anxiety.

## 2019-06-06 NOTE — Patient Instructions (Signed)
Check BP only once per week. Can use the anxity pill instead of sleep medication at night. Can try a 1/2 tablet during the day. Continue amlodipine 2.5 mg daily. Keep work ing stress reduction and relaxation.  Keep up with daily exercise.

## 2019-06-07 IMAGING — MG DIGITAL DIAGNOSTIC BILATERAL MAMMOGRAM WITH TOMO AND CAD
6 of 12 series · 6 of 36 positions shown · non-contrast
Comparison: Previous exam(s).

CLINICAL DATA: Focal pain right breast

EXAM:
DIGITAL DIAGNOSTIC BILATERAL MAMMOGRAM WITH CAD AND TOMO
ULTRASOUND RIGHT BREAST

[R MLO synth-2D]
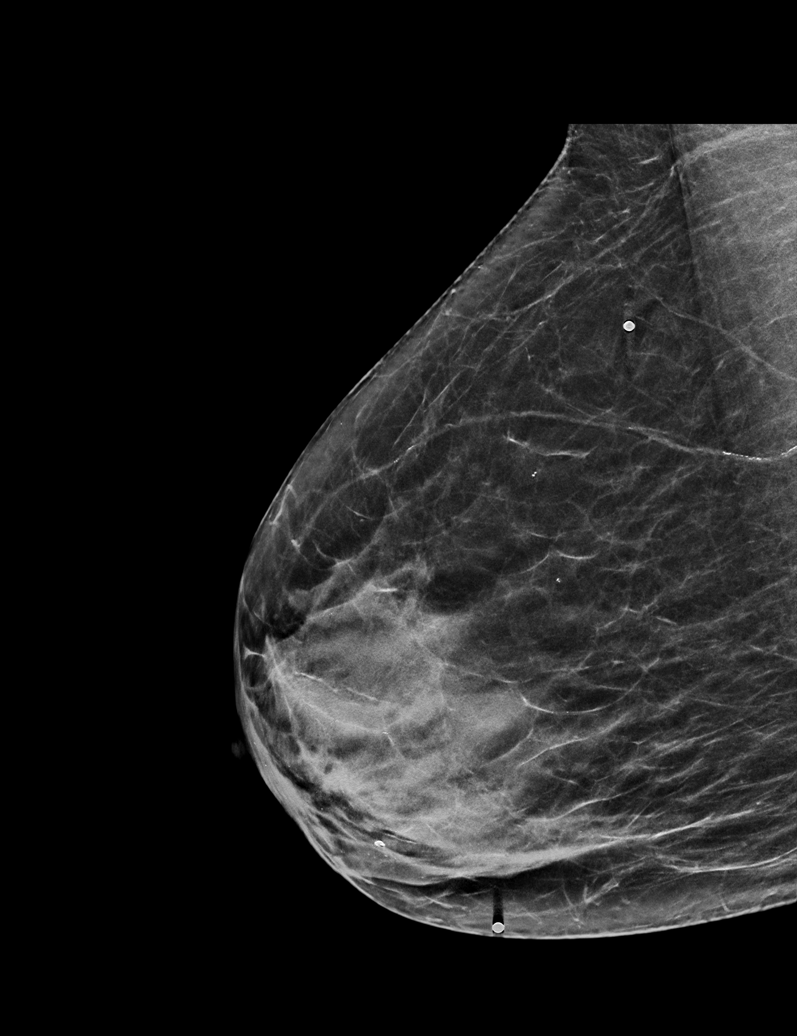

[L MLO synth-2D]
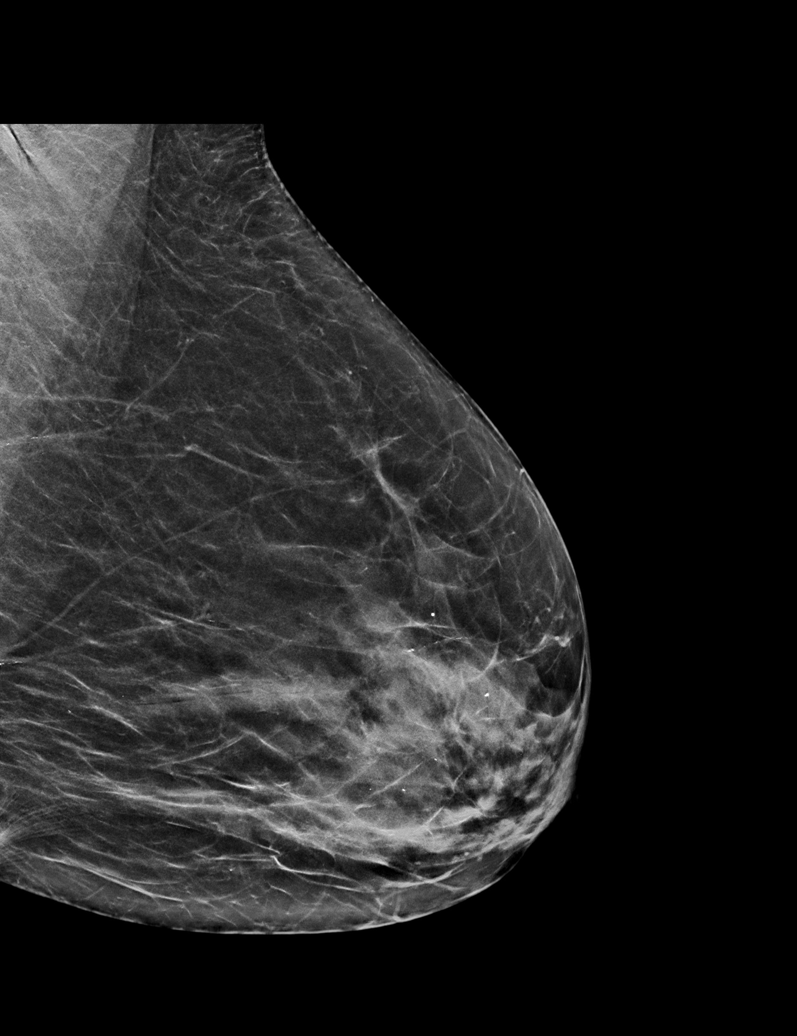

[L CC synth-2D]
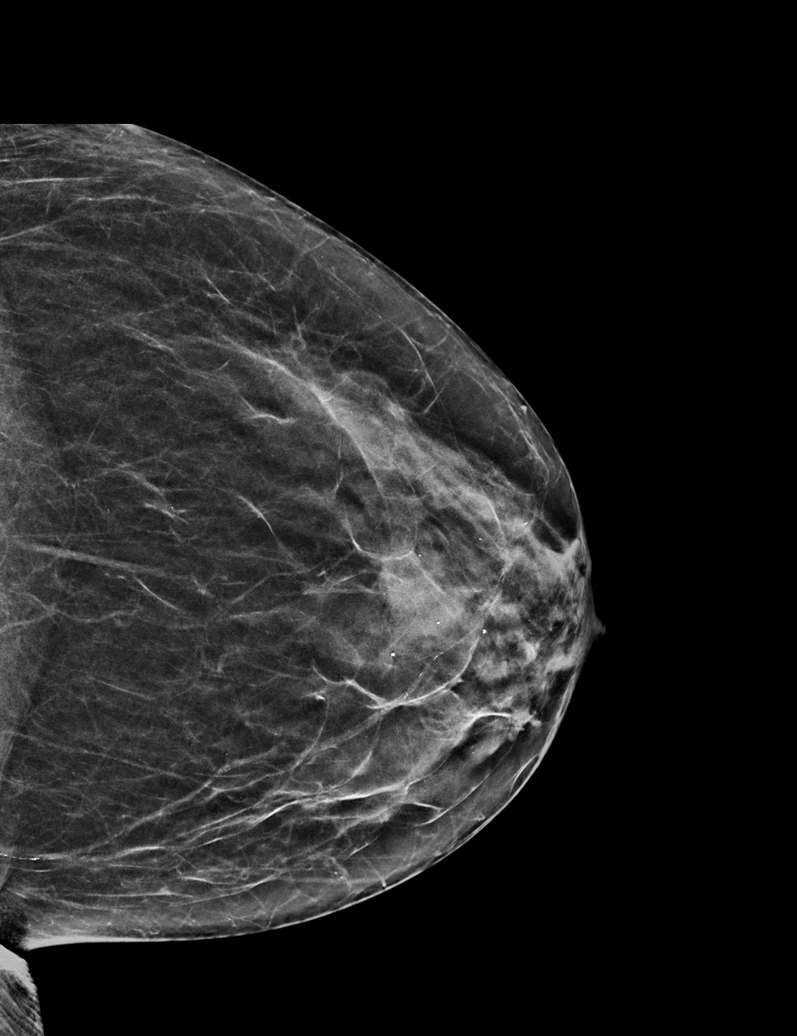

[R TAN synth-2D (1 of 2)]
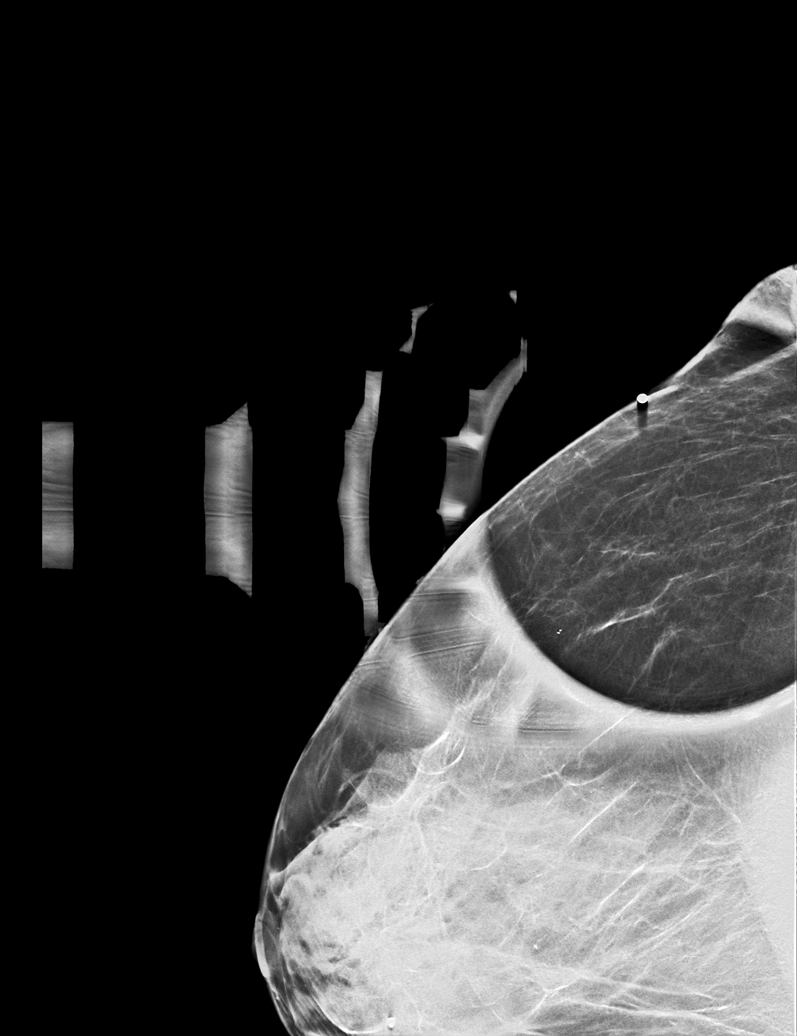

[R TAN synth-2D (2 of 2)]
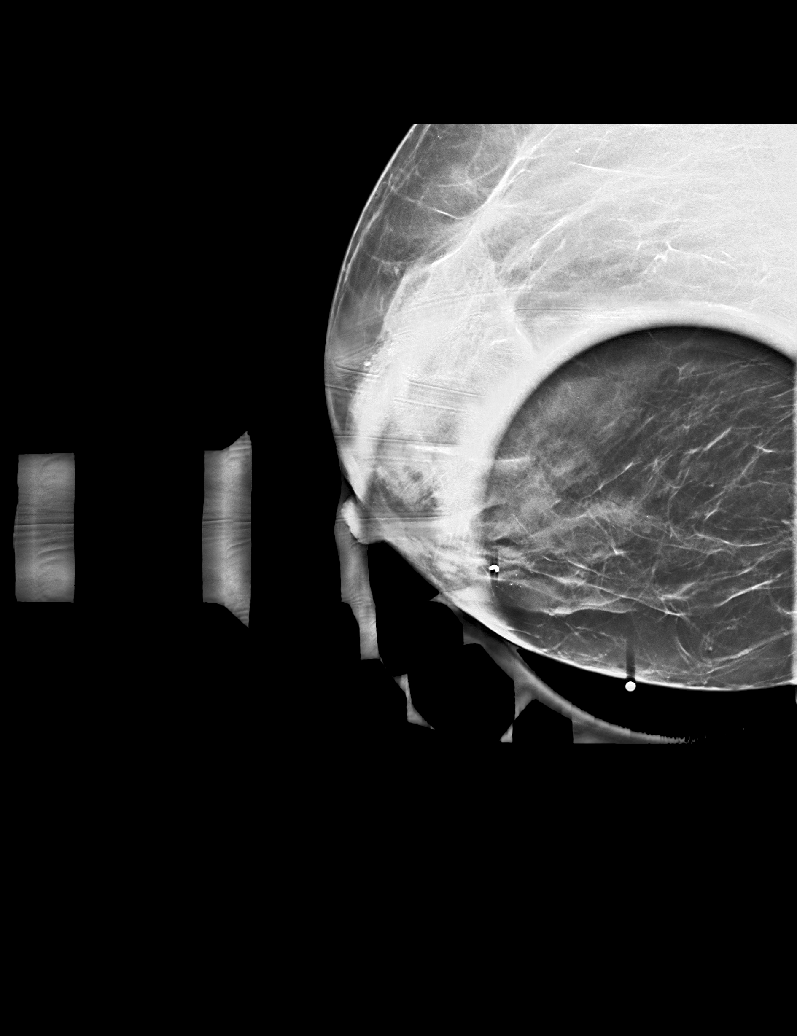

[R CC synth-2D]
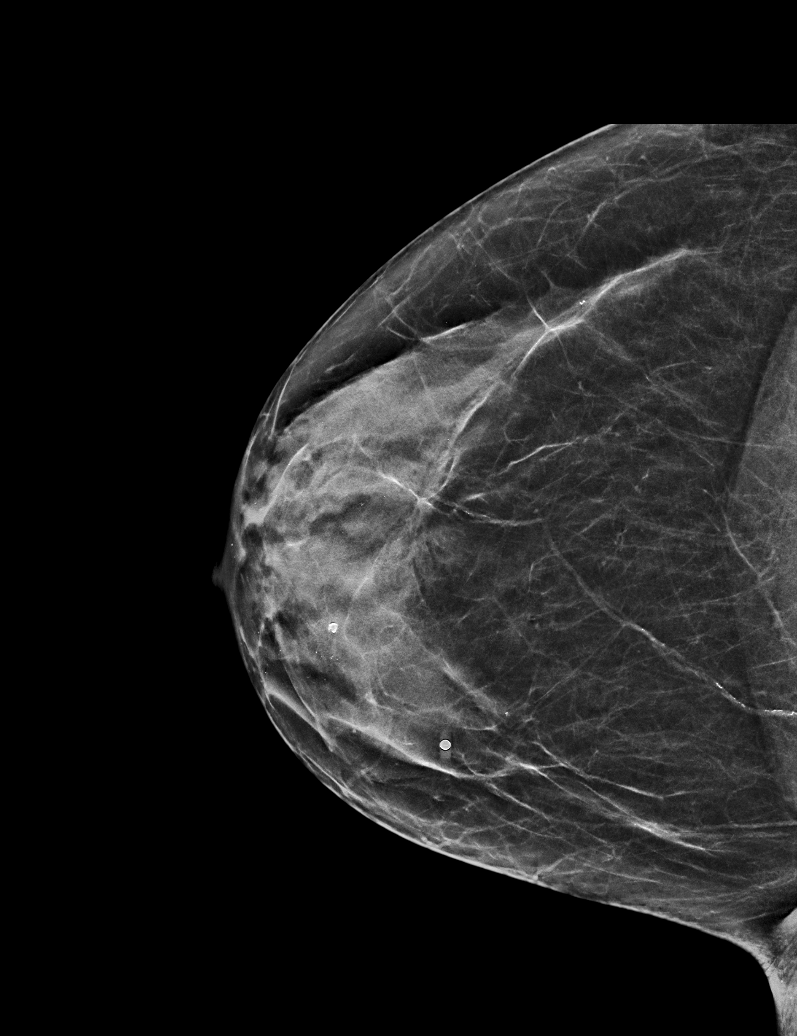

[6 of 36 positions shown; findings below may reference images not displayed]

ACR Breast Density Category c: The breast tissue is heterogeneously
dense, which may obscure small masses.
FINDINGS: Cc and MLO views of bilateral breasts, spot tangential views of
right breast are submitted. No suspicious abnormalities identified
bilaterally.

Mammographic images were processed with CAD.

Targeted ultrasound is performed, showing no focal abnormal discrete
cystic or solid lesion in the focal areas of pain right breast 3
o'clock, 4 o'clock, 10 o'clock positions.
IMPRESSION: Negative.

RECOMMENDATION:
Routine screening mammogram in 1 year.

I have discussed the findings and recommendations with the patient.
Results were also provided in writing at the conclusion of the
visit. If applicable, a reminder letter will be sent to the patient
regarding the next appointment.

BI-RADS CATEGORY  1: Negative.

## 2019-06-19 ENCOUNTER — Encounter: Payer: Self-pay | Admitting: Emergency Medicine

## 2019-06-19 ENCOUNTER — Other Ambulatory Visit: Payer: Self-pay

## 2019-06-19 ENCOUNTER — Ambulatory Visit
Admission: EM | Admit: 2019-06-19 | Discharge: 2019-06-19 | Disposition: A | Discharge status: Home / Self Care | Payer: PPO | Attending: Emergency Medicine | Admitting: Emergency Medicine

## 2019-06-19 DIAGNOSIS — R42 Dizziness and giddiness: Secondary | ICD-10-CM | POA: Diagnosis not present

## 2019-06-19 DIAGNOSIS — Z7689 Persons encountering health services in other specified circumstances: Secondary | ICD-10-CM | POA: Diagnosis not present

## 2019-06-19 DIAGNOSIS — J029 Acute pharyngitis, unspecified: Secondary | ICD-10-CM | POA: Diagnosis not present

## 2019-06-19 LAB — POCT RAPID STREP A (OFFICE): Rapid Strep A Screen: NEGATIVE

## 2019-06-19 NOTE — ED Triage Notes (Signed)
Patient in office today for dizziness and sore throat x 2d    OTC: honey green tea

## 2019-06-19 NOTE — Discharge Instructions (Addendum)
Your rapid strep test is negative.  A throat culture is pending; we will call you if it is positive requiring treatment.    Your COVID test is pending.  You should self quarantine until your test result is back and is negative.    Go to the emergency department if you develop high fever, shortness of breath, severe diarrhea, or other concerning symptoms.

## 2019-06-19 NOTE — ED Provider Notes (Signed)
[name]    CSN: [id number] Arrival date & time: 06/19/19  1124      History   Chief Complaint Chief Complaint  Patient presents with  . Sore Throat    HPI Jessica Hardin is a 78 y.o. female.   Patient presents with 2-day history of sore throat and dizziness.  The dizziness is worse with movement and improves with sitting still.  She denies fever, chills, congestion, cough, shortness of breath, vomiting, diarrhea, rash, difficulty swallowing, or other symptoms.  He has attempted treatment at home with honey green tea.  The history is provided by the patient.    Past Medical History:  Diagnosis Date  . Allergy   . Asthma   . GERD (gastroesophageal reflux disease)   . History of chicken pox   . IBS (irritable bowel syndrome)   . Migraine   . Urinary tract infection     Patient Active Problem List   Diagnosis Date Noted  . Essential hypertension 05/14/2019  . Medication side effect, initial encounter 03/20/2019  . Arthralgia 01/21/2019  . Breast mass, right 04/29/2018  . Generalized abdominal pain 02/19/2018  . GAD (generalized anxiety disorder) 10/30/2017  . MDD (major depressive disorder), single episode, mild ([place]) 10/30/2017  . Chronic insomnia 10/30/2017  . Post-nasal drip 05/04/2017  . Allergic rhinitis 10/05/2016  . Epidermoid cyst of finger of left hand 07/27/2016  . Osteopenia 11/18/2015  . Chronic constipation 07/20/2015  . Internal hemorrhoid, bleeding 07/20/2015  . 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 Counseling regarding end of life decision making 09/08/2014  . Childhood asthma 08/13/2014  . Gastroesophageal reflux disease without esophagitis 08/13/2014  . Irritable bowel syndrome with constipation 08/13/2014  . Other seasonal allergic rhinitis 08/13/2014  . Hx of migraines 08/13/2014  . Family history of colon cancer 08/13/2014  . Family history of uterine cancer 08/13/2014  . Family history of breast cancer 08/13/2014    Past Surgical  History:  Procedure Laterality Date  . ABDOMINAL HYSTERECTOMY    . CATARACT EXTRACTION, BILATERAL    . TUBAL LIGATION      OB History   No obstetric history on file.      Home Medications    Prior to Admission medications   Medication Sig Start Date End Date Taking? Authorizing Provider  amLODipine (NORVASC) 2.5 MG tablet Take 1 tablet (2.5 mg total) by mouth daily. 05/14/19   Ria Bush, MD  hydrOXYzine (ATARAX/VISTARIL) 10 MG tablet Take 1 tablet (10 mg total) by mouth 3 (three) times daily as needed. 05/22/19   Bedsole, Amy E, MD  montelukast (SINGULAIR) 10 MG tablet Take 1 tablet (10 mg total) by mouth at bedtime. 10/03/18   Bedsole, Amy E, MD  Multiple Vitamin (MULTIVITAMIN) capsule Take 1 capsule by mouth daily.     [provider]  traZODone (DESYREL) 50 MG tablet Take 0.5-1 tablets (25-50 mg total) by mouth at bedtime as needed for sleep. 12/03/18   Jinny Sanders, MD    Family History Family History  Problem Relation Age of Onset  . Arthritis Mother   . Hypertension Mother   . Arthritis Sister   . Hypertension Sister   . Colon cancer Brother 67  . Breast cancer Sister   . Breast cancer Sister   . Uterine cancer Sister 61  . Multiple sclerosis Daughter     Social History Social History   Tobacco Use  . Smoking status: Never Smoker  . Smokeless tobacco: Never Used  Substance Use Topics  .  Alcohol use: No    Alcohol/week: 0.0 standard drinks  . Drug use: No     Allergies   Shellfish allergy   Review of Systems Review of Systems  Constitutional: Negative for chills and fever.  HENT: Positive for sore throat. Negative for congestion, ear pain and trouble swallowing.   Eyes: Negative for pain and visual disturbance.  Respiratory: Negative for cough and shortness of breath.   Cardiovascular: Negative for chest pain and palpitations.  Gastrointestinal: Negative for abdominal pain, diarrhea, nausea and vomiting.  Genitourinary: Negative for  dysuria and hematuria.  Musculoskeletal: Negative for arthralgias and back pain.  Skin: Negative for color change and rash.  Neurological: Positive for dizziness. Negative for seizures, syncope, facial asymmetry, speech difficulty, weakness, numbness and headaches.  All other systems reviewed and are negative.    Physical Exam Triage Vital Signs ED Triage Vitals [06/19/19 1128]  Enc Vitals Group     BP (!) 153/82     Pulse Rate 70     Resp 18     Temp 99.2 F (37.3 C)     Temp Source Oral     SpO2 99 %     Weight      Height      Head Circumference      Peak Flow      Pain Score      Pain Loc      Pain Edu?      Excl. in [place]?    No data found.  Updated Vital Signs BP (!) 153/82 (BP Location: Left Arm)   Pulse 70   Temp 99.2 F (37.3 C) (Oral)   Resp 18   Wt 121 lb (54.9 kg)   SpO2 99%   BMI 24.65 kg/m   Visual Acuity Right Eye Distance:   Left Eye Distance:   Bilateral Distance:    Right Eye Near:   Left Eye Near:    Bilateral Near:     Physical Exam Vitals and nursing note reviewed.  Constitutional:      General: She is not in acute distress.    Appearance: She is well-developed. She is not ill-appearing.  HENT:     Head: Normocephalic and atraumatic.     Right Ear: Tympanic membrane normal.     Left Ear: Tympanic membrane normal.     Nose: Nose normal.     Mouth/Throat:     Mouth: Mucous membranes are moist.     Pharynx: Posterior oropharyngeal erythema present. No oropharyngeal exudate.  Eyes:     Conjunctiva/sclera: Conjunctivae normal.  Cardiovascular:     Rate and Rhythm: Normal rate and regular rhythm.     Heart sounds: Normal heart sounds. No murmur.  Pulmonary:     Effort: Pulmonary effort is normal. No respiratory distress.     Breath sounds: Normal breath sounds.  Abdominal:     General: Bowel sounds are normal.     Palpations: Abdomen is soft.     Tenderness: There is no abdominal tenderness. There is no guarding or rebound.    Musculoskeletal:     Cervical back: Neck supple.  Skin:    General: Skin is warm and dry.     Findings: No rash.  Neurological:     General: No focal deficit present.     Mental Status: She is alert and oriented to person, place, and time.     Cranial Nerves: No cranial nerve deficit.     Sensory: No sensory deficit.  Motor: No weakness.     Coordination: Coordination normal.     Gait: Gait normal.     Deep Tendon Reflexes: Reflexes normal.  Psychiatric:        Mood and Affect: Mood normal.        Behavior: Behavior normal.      UC Treatments / Results  Labs (all labs ordered are listed, but only abnormal results are displayed) Labs Reviewed  CULTURE, GROUP A STREP ([place])  NOVEL CORONAVIRUS, NAA  POCT RAPID STREP A (OFFICE)    EKG   Radiology No results found.  Procedures Procedures (including critical care time)  Medications Ordered in UC Medications - No data to display  Initial Impression / Assessment and Plan / UC Course  I have reviewed the triage vital signs and the nursing notes.  Pertinent labs & imaging results that were available during my care of the patient were reviewed by me and considered in my medical decision making (see chart for details).   Dizziness, sore throat.  Rapid strep negative; throat culture pending.  COVID test performed here.  Instructed patient to self quarantine until the test result is back.  Instructed patient to go to the emergency department if develops high fever, shortness of breath, severe diarrhea, or other concerning symptoms.  Patient agrees with plan of care.     Final Clinical Impressions(s) / UC Diagnoses   Final diagnoses:  Dizziness and giddiness  Sore throat     Discharge Instructions     Your rapid strep test is negative.  A throat culture is pending; we will call you if it is positive requiring treatment.    Your COVID test is pending.  You should self quarantine until your test result is back and  is negative.    Go to the emergency department if you develop high fever, shortness of breath, severe diarrhea, or other concerning symptoms.       ED Prescriptions    None     PDMP not reviewed this encounter.   Sharion Balloon, NP 06/19/19 1226

## 2019-06-21 LAB — CULTURE, GROUP A STREP (THRC)

## 2019-06-21 LAB — NOVEL CORONAVIRUS, NAA: SARS-CoV-2, NAA: NOT DETECTED

## 2019-06-27 ENCOUNTER — Telehealth: Payer: Self-pay

## 2019-06-27 NOTE — Telephone Encounter (Addendum)
Pt seen ED for dizziness 06/19/19. Pt continues with lightheadedness on and off that seem worse upon movement; if pt sitting still does not have lightheadedness; pt has generalized weakness;BP on 06/26/19 131/79 P 72 T 97.2. pt has not taken BP today. Pt said usually BP 130-150/84. Pt had recent covid test that was negative. Pt said she gets S/T if she eats sweets. No CP,SOB,H/A,fever,chils diarrhea and no other covid symptoms. Pt has appt 07/01/19 at 9 AM with Dr Diona Browner in office and pt is to drink more fluids,rest and if taking atarax to stop it. Pt has not been taking atarax and pt voiced understanding of these instructions and understood UC & ED precautions. FYI to Dr Diona Browner.

## 2019-07-01 ENCOUNTER — Encounter: Payer: Self-pay | Admitting: Family Medicine

## 2019-07-01 ENCOUNTER — Ambulatory Visit (INDEPENDENT_AMBULATORY_CARE_PROVIDER_SITE_OTHER): Payer: PPO | Admitting: Family Medicine

## 2019-07-01 ENCOUNTER — Other Ambulatory Visit: Payer: Self-pay

## 2019-07-01 VITALS — BP 159/80 | HR 65 | Temp 97.7°F | Ht 58.75 in | Wt 122.0 lb

## 2019-07-01 DIAGNOSIS — I10 Essential (primary) hypertension: Secondary | ICD-10-CM | POA: Diagnosis not present

## 2019-07-01 DIAGNOSIS — H9311 Tinnitus, right ear: Secondary | ICD-10-CM | POA: Diagnosis not present

## 2019-07-01 DIAGNOSIS — R4189 Other symptoms and signs involving cognitive functions and awareness: Secondary | ICD-10-CM

## 2019-07-01 DIAGNOSIS — F320 Major depressive disorder, single episode, mild: Secondary | ICD-10-CM

## 2019-07-01 DIAGNOSIS — H40003 Preglaucoma, unspecified, bilateral: Secondary | ICD-10-CM | POA: Diagnosis not present

## 2019-07-01 MED ORDER — CITALOPRAM HYDROBROMIDE 10 MG PO TABS: 10.0000 mg | ORAL_TABLET | Freq: Every day | ORAL | 3 refills | Status: DC

## 2019-07-01 NOTE — Assessment & Plan Note (Addendum)
Hearing test today: normal testing.  In past no improvement with flonase trial. Will refer to ENT.

## 2019-07-01 NOTE — Assessment & Plan Note (Signed)
Moderate control...  SE to remeron  And sertraline.   GIven this was felt to be due to memory issues.. will try a trial of celexa.

## 2019-07-01 NOTE — Patient Instructions (Addendum)
Start celexa for mood and therefore memory. We will call to set up ENT referral for ringing in right ear.

## 2019-07-01 NOTE — Progress Notes (Addendum)
Chief Complaint  Patient presents with  . Patient states she doesn't remember why she made this appoin    History of Present Illness: HPI   79 year old female presents with intermittent lightheadedness.  This was self limited. Now she no longer id dizzy, she is not sure why she felt this way. When she is trying to remember things it makes her dizzy.  She has noted some ringing in right ear.   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   Today she reports She is on amlodipine 2.5 mg daily .. she took this  AM.  At home 135/83.Marland Kitchen may be elevated when in office from white coat hypertension BP Readings from Last 3 Encounters:  07/01/19 (!) 159/80  06/19/19 (!) 153/82  06/06/19 140/70    No CP, no SOB. No HA, no vision change, no new numbness, no weakness. NO slurred speech. She reports her anxiety is doing better. Sleeping at night with trazodone. 6-7 hours.  Atarax helps some with anxiety.Marland Kitchen denies lighthedness with this. PHQ9 SCORE ONLY 07/01/2019 05/22/2019 12/03/2018  Score 8 13 6      This visit occurred during the SARS-CoV-2 public health emergency.  Safety protocols were in place, including screening questions prior to the visit, additional usage of staff PPE, and extensive cleaning of exam room while observing appropriate contact time as indicated for disinfecting solutions.   COVID 19 screen:  No recent travel or known exposure to COVID19 The patient denies respiratory symptoms of COVID 19 at this time. The importance of social distancing was discussed today.     Review of Systems  Constitutional: Negative for chills and fever.  HENT: Negative for congestion and ear pain.   Eyes: Negative for pain and redness.  Respiratory: Negative for cough and shortness of breath.   Cardiovascular: Negative for chest pain, palpitations and leg swelling.  Gastrointestinal: Negative for abdominal  pain, blood in stool, constipation, diarrhea, nausea and vomiting.  Genitourinary: Negative for dysuria.  Musculoskeletal: Negative for falls and myalgias.  Skin: Negative for rash.  Neurological: Negative for dizziness.  Psychiatric/Behavioral: Negative for depression. The patient is not nervous/anxious.       Past Medical History:  Diagnosis Date  . Allergy   . Asthma   . GERD (gastroesophageal reflux disease)   . History of chicken pox   . IBS (irritable bowel syndrome)   . Migraine   . Urinary tract infection     reports that she has never smoked. She has never used smokeless tobacco. She reports that she does not drink alcohol or use drugs.   Current Outpatient Medications:  .  amLODipine (NORVASC) 2.5 MG tablet, Take 1 tablet (2.5 mg total) by mouth daily., Disp: 30 tablet, Rfl: 3 .  hydrOXYzine (ATARAX/VISTARIL) 10 MG tablet, Take 1 tablet (10 mg total) by mouth 3 (three) times daily as needed., Disp: 30 tablet, Rfl: 0 .  montelukast (SINGULAIR) 10 MG tablet, Take 1 tablet (10 mg total) by mouth at bedtime., Disp: 30 tablet, Rfl: 3 .  Multiple Vitamin (MULTIVITAMIN) capsule, Take 1 capsule by mouth daily. , Disp: , Rfl:  .  traZODone (DESYREL) 50 MG tablet, Take 0.5-1 tablets (25-50 mg total) by mouth at bedtime as needed for sleep., Disp: 30 tablet, Rfl: 3   Observations/Objective: Blood pressure (!) 159/80, pulse 65, temperature 97.7 F (36.5 C), temperature source Temporal, height 4' 10.75" (1.492 m),  weight 122 lb (55.3 kg), SpO2 95 %.  Physical Exam Constitutional:      General: She is not in acute distress.    Appearance: Normal appearance. She is well-developed. She is not ill-appearing or toxic-appearing.  HENT:     Head: Normocephalic.     Right Ear: Hearing, tympanic membrane, ear canal and external ear normal. Tympanic membrane is not erythematous, retracted or bulging.     Left Ear: Hearing, tympanic membrane, ear canal and external ear normal. Tympanic  membrane is not erythematous, retracted or bulging.     Nose: No mucosal edema or rhinorrhea.     Right Sinus: No maxillary sinus tenderness or frontal sinus tenderness.     Left Sinus: No maxillary sinus tenderness or frontal sinus tenderness.     Mouth/Throat:     Pharynx: Uvula midline.  Eyes:     General: Lids are normal. Lids are everted, no foreign bodies appreciated.     Conjunctiva/sclera: Conjunctivae normal.     Pupils: Pupils are equal, round, and reactive to light.  Neck:     Thyroid: No thyroid mass or thyromegaly.     Vascular: No carotid bruit.     Trachea: Trachea normal.  Cardiovascular:     Rate and Rhythm: Normal rate and regular rhythm.     Pulses: Normal pulses.     Heart sounds: Normal heart sounds, S1 normal and S2 normal. No murmur. No friction rub. No gallop.   Pulmonary:     Effort: Pulmonary effort is normal. No tachypnea or respiratory distress.     Breath sounds: Normal breath sounds. No decreased breath sounds, wheezing, rhonchi or rales.  Abdominal:     General: Bowel sounds are normal.     Palpations: Abdomen is soft.     Tenderness: There is no abdominal tenderness.  Musculoskeletal:     Cervical back: Normal range of motion and neck supple.  Skin:    General: Skin is warm and dry.     Findings: No rash.  Neurological:     Mental Status: She is alert and oriented to person, place, and time.     GCS: GCS eye subscore is 4. GCS verbal subscore is 5. GCS motor subscore is 6.     Cranial Nerves: No cranial nerve deficit.     Sensory: No sensory deficit.     Motor: No abnormal muscle tone.     Coordination: Coordination normal.     Gait: Gait normal.     Deep Tendon Reflexes: Reflexes are normal and symmetric.     Comments: Nml cerebellar exam   No papilledema  Psychiatric:        Mood and Affect: Mood is not anxious or depressed. Affect is blunt and flat.        Speech: Speech normal.        Behavior: Behavior normal. Behavior is cooperative.         Thought Content: Thought content normal. Thought content does not include homicidal or suicidal ideation. Thought content does not include homicidal or suicidal plan.        Cognition and Memory: Memory is not impaired. She does not exhibit impaired recent memory or impaired remote memory.        Judgment: Judgment normal.      Assessment and Plan Essential hypertension Elevated in office but at goal at home on amlodipine.  MDD (major depressive disorder), single episode, mild ([facility]) Moderate control...  SE to remeron  And sertraline.  GIven this was felt to be due to memory issues.. will try a trial of celexa.    Pseudodementia  Continued report of issues per pt. Mood as of yet untreated. If improved mood with clexa and still with issues... will consider re-eval with neurocognitive testing vs neuro referral.   Lab eval in past negative  Tinnitus of right ear Hearing test today: normal testing.  In past no improvement with flonase trial. Will refer to ENT.       Eliezer Lofts, MD

## 2019-07-01 NOTE — Assessment & Plan Note (Signed)
Elevated in office but at goal at home on amlodipine.

## 2019-07-01 NOTE — Assessment & Plan Note (Signed)
Continued report of issues per pt. Mood as of yet untreated. If improved mood with clexa and still with issues... will consider re-eval with neurocognitive testing vs neuro referral.   Lab eval in past negative

## 2019-07-03 ENCOUNTER — Telehealth: Payer: Self-pay

## 2019-07-03 NOTE — Telephone Encounter (Signed)
Pt left v/m requesting cb due to "blurrred head" or dizziness. No H/A and pt wanted to schedule appt. I called pt back and line was busy. Will try again. Phone continued to be busy. I tried before leaving and got pt on phone. Pt said she continues with dizziness and she is tired of it. BP today at 1 PM was 144/78 P 81 T 98.0 pt is taking BP now. BP is 163/83 P103.pt said had went upstairs ad that may have caused increase in heart rate and BP. pt did take amlodipine 2.5 mg today. the dizziness is coming and going.No dizziness right now.No H/A,CP or SOB. Pt has been under stress due to al that is going on with covid. Pt is going to rest, drink plenty of fluids and request cb with further instructions from Dr Diona Browner on 07/04/19. UC & ED precautions given and pt voiced understanding. Pt rechecked BP 129/80 P 102. Pt feels comfortable and understands instructions and will wait for call on 07/04/19. FYI to Dr Diona Browner.

## 2019-07-04 NOTE — Telephone Encounter (Signed)
Have her increase amlodipine to 5 mg daily.

## 2019-07-04 NOTE — Telephone Encounter (Signed)
Ms. Jessica Hardin notified as instructed by telephone.  Patient states understanding.

## 2019-07-10 ENCOUNTER — Telehealth: Payer: Self-pay | Admitting: Family Medicine

## 2019-07-10 NOTE — Telephone Encounter (Signed)
I did not call pt. 

## 2019-07-10 NOTE — Telephone Encounter (Signed)
Pt returned call from dr Diona Browner

## 2019-07-10 NOTE — Telephone Encounter (Signed)
Pt aware no one called her.  She is going to listen to voicemail to see who called

## 2019-07-16 DIAGNOSIS — H40003 Preglaucoma, unspecified, bilateral: Secondary | ICD-10-CM | POA: Diagnosis not present

## 2019-07-17 DIAGNOSIS — H903 Sensorineural hearing loss, bilateral: Secondary | ICD-10-CM | POA: Diagnosis not present

## 2019-07-17 DIAGNOSIS — R42 Dizziness and giddiness: Secondary | ICD-10-CM | POA: Diagnosis not present

## 2019-07-21 ENCOUNTER — Ambulatory Visit
Admission: EM | Admit: 2019-07-21 | Discharge: 2019-07-21 | Disposition: A | Discharge status: Home / Self Care | Payer: PPO | Attending: Emergency Medicine | Admitting: Emergency Medicine

## 2019-07-21 ENCOUNTER — Encounter: Payer: Self-pay | Admitting: Emergency Medicine

## 2019-07-21 ENCOUNTER — Other Ambulatory Visit: Payer: Self-pay

## 2019-07-21 DIAGNOSIS — F411 Generalized anxiety disorder: Secondary | ICD-10-CM | POA: Diagnosis not present

## 2019-07-21 DIAGNOSIS — Z0189 Encounter for other specified special examinations: Secondary | ICD-10-CM | POA: Diagnosis not present

## 2019-07-21 HISTORY — DX: Anxiety disorder, unspecified: F41.9

## 2019-07-21 NOTE — ED Triage Notes (Addendum)
Patient in today c/o fatigue and feeling anxious x 2 days. Patient denies any other symptoms. Patient has not tried any OTC medications. Patient states she has medication for anxiety, but hasn't taken it.

## 2019-07-21 NOTE — Discharge Instructions (Addendum)
Your COVID test is pending.  You should self quarantine until your test result is back and is negative.    Take Tylenol as needed for fever or discomfort.  Rest and keep yourself hydrated.    Go to the emergency department if you develop high fever, shortness of breath, severe diarrhea, or other concerning symptoms.    Your blood pressure is elevated today at 168/79.  Please have this rechecked by your primary care provider in 2-4 weeks.

## 2019-07-21 NOTE — ED Provider Notes (Signed)
[name]    CSN: [id number] Arrival date & time: 07/21/19  1541      History   Chief Complaint Chief Complaint  Patient presents with  . Fatigue  . anxious    HPI Jessica Hardin is a 79 y.o. female.   Patient presents with fatigue x2 days.  She states she feels anxious also, since marching the news and seeing the symptoms of COVID.  She requests a COVID test.  Patient had a negative COVID test on 06/19/2019.  Patient states she has a history of anxiety but has not taken her anxiety medication today.  She denies fever, chills, congestion, cough, shortness of breath, or other symptoms.  No treatments attempted at home.  The history is provided by the patient.    Past Medical History:  Diagnosis Date  . Allergy   . Anxiety   . Asthma   . GERD (gastroesophageal reflux disease)   . History of chicken pox   . IBS (irritable bowel syndrome)   . Migraine   . Urinary tract infection     Patient Active Problem List   Diagnosis Date Noted  . Essential hypertension 05/14/2019  . Medication side effect, initial encounter 03/20/2019  . Arthralgia 01/21/2019  . Breast mass, right 04/29/2018  . Generalized abdominal pain 02/19/2018  . GAD (generalized anxiety disorder) 10/30/2017  . MDD (major depressive disorder), single episode, mild ([place]) 10/30/2017  . Chronic insomnia 10/30/2017  . Post-nasal drip 05/04/2017  . Tinnitus of right ear 05/04/2017  . Allergic rhinitis 10/05/2016  . Epidermoid cyst of finger of left hand 07/27/2016  . Osteopenia 11/18/2015  . Chronic constipation 07/20/2015  . Internal hemorrhoid, bleeding 07/20/2015  . 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 Counseling regarding end of life decision making 09/08/2014  . Childhood asthma 08/13/2014  . Gastroesophageal reflux disease without esophagitis 08/13/2014  . Irritable bowel syndrome with constipation 08/13/2014  . Other seasonal allergic rhinitis 08/13/2014  . Hx of migraines 08/13/2014  . Family  history of colon cancer 08/13/2014  . Family history of uterine cancer 08/13/2014  . Family history of breast cancer 08/13/2014    Past Surgical History:  Procedure Laterality Date  . ABDOMINAL HYSTERECTOMY    . CATARACT EXTRACTION, BILATERAL    . TUBAL LIGATION      OB History   No obstetric history on file.      Home Medications    Prior to Admission medications   Medication Sig Start Date End Date Taking? Authorizing Provider  amLODipine (NORVASC) 2.5 MG tablet Take 1 tablet (2.5 mg total) by mouth daily. 05/14/19  Yes Ria Bush, MD  citalopram (CELEXA) 10 MG tablet Take 1 tablet (10 mg total) by mouth daily. 07/01/19  Yes Bedsole, Amy E, MD  montelukast (SINGULAIR) 10 MG tablet Take 1 tablet (10 mg total) by mouth at bedtime. 10/03/18  Yes Bedsole, Amy E, MD  Multiple Vitamin (MULTIVITAMIN) capsule Take 1 capsule by mouth daily.    Yes [provider]  traZODone (DESYREL) 50 MG tablet Take 0.5-1 tablets (25-50 mg total) by mouth at bedtime as needed for sleep. 12/03/18  Yes Bedsole, Amy E, MD  hydrOXYzine (ATARAX/VISTARIL) 10 MG tablet Take 1 tablet (10 mg total) by mouth 3 (three) times daily as needed. 05/22/19   Jinny Sanders, MD    Family History Family History  Problem Relation Age of Onset  . Arthritis Mother   . Hypertension Mother   . Arthritis Sister   . Hypertension Sister   .  Colon cancer Brother 34  . Breast cancer Sister   . Breast cancer Sister   . Uterine cancer Sister 35  . Multiple sclerosis Daughter     Social History Social History   Tobacco Use  . Smoking status: Never Smoker  . Smokeless tobacco: Never Used  Substance Use Topics  . Alcohol use: No    Alcohol/week: 0.0 standard drinks  . Drug use: No     Allergies   Shellfish allergy   Review of Systems Review of Systems  Constitutional: Positive for fatigue. Negative for chills and fever.  HENT: Negative for congestion, ear pain, rhinorrhea and sore throat.     Eyes: Negative for pain and visual disturbance.  Respiratory: Negative for cough and shortness of breath.   Cardiovascular: Negative for chest pain and palpitations.  Gastrointestinal: Negative for abdominal pain, diarrhea, nausea and vomiting.  Genitourinary: Negative for dysuria and hematuria.  Musculoskeletal: Negative for arthralgias and back pain.  Skin: Negative for color change and rash.  Neurological: Negative for seizures and syncope.  Psychiatric/Behavioral: The patient is nervous/anxious.   All other systems reviewed and are negative.    Physical Exam Triage Vital Signs ED Triage Vitals  Enc Vitals Group     BP      Pulse      Resp      Temp      Temp src      SpO2      Weight      Height      Head Circumference      Peak Flow      Pain Score      Pain Loc      Pain Edu?      Excl. in [place]?    No data found.  Updated Vital Signs BP (!) 168/79 (BP Location: Left Arm)   Pulse (!) 101   Temp 98.3 F (36.8 C) (Oral)   Resp 18   Ht 4\' 11"  (1.499 m)   Wt 121 lb (54.9 kg)   SpO2 97%   BMI 24.44 kg/m   Visual Acuity Right Eye Distance:   Left Eye Distance:   Bilateral Distance:    Right Eye Near:   Left Eye Near:    Bilateral Near:     Physical Exam Vitals and nursing note reviewed.  Constitutional:      General: She is not in acute distress.    Appearance: She is well-developed. She is not ill-appearing.  HENT:     Head: Normocephalic and atraumatic.     Right Ear: Tympanic membrane normal.     Left Ear: Tympanic membrane normal.     Nose: Nose normal.     Mouth/Throat:     Mouth: Mucous membranes are moist.     Pharynx: Oropharynx is clear.  Eyes:     Conjunctiva/sclera: Conjunctivae normal.  Cardiovascular:     Rate and Rhythm: Normal rate and regular rhythm.     Heart sounds: No murmur.  Pulmonary:     Effort: Pulmonary effort is normal. No respiratory distress.     Breath sounds: Normal breath sounds.  Abdominal:     General: Bowel  sounds are normal.     Palpations: Abdomen is soft.     Tenderness: There is no abdominal tenderness. There is no guarding or rebound.  Musculoskeletal:     Cervical back: Neck supple.  Skin:    General: Skin is warm and dry.     Findings: No rash.  Neurological:     General: No focal deficit present.     Mental Status: She is alert and oriented to person, place, and time.  Psychiatric:        Mood and Affect: Mood normal.        Behavior: Behavior normal.      UC Treatments / Results  Labs (all labs ordered are listed, but only abnormal results are displayed) Labs Reviewed  NOVEL CORONAVIRUS, NAA    EKG   Radiology No results found.  Procedures Procedures (including critical care time)  Medications Ordered in UC Medications - No data to display  Initial Impression / Assessment and Plan / UC Course  I have reviewed the triage vital signs and the nursing notes.  Pertinent labs & imaging results that were available during my care of the patient were reviewed by me and considered in my medical decision making (see chart for details).    Patient request for COVID test.  Anxiety.  Instructed patient to take her anxiety medication as directed by her PCP.  COVID test performed here.  Instructed patient to self quarantine until the test result is back.  Discussed with patient that she can take Tylenol as needed for fever or discomfort.  Instructed patient to go to the emergency department if she develops high fever, shortness of breath, severe diarrhea, or other concerning symptoms.  Patient agrees with plan of care.   Final Clinical Impressions(s) / UC Diagnoses   Final diagnoses:  Patient request for diagnostic testing  Anxiety state     Discharge Instructions     Your COVID test is pending.  You should self quarantine until your test result is back and is negative.    Take Tylenol as needed for fever or discomfort.  Rest and keep yourself hydrated.    Go to the  emergency department if you develop high fever, shortness of breath, severe diarrhea, or other concerning symptoms.    Your blood pressure is elevated today at 168/79.  Please have this rechecked by your primary care provider in 2-4 weeks.         ED Prescriptions    None     PDMP not reviewed this encounter.   Sharion Balloon, NP 07/21/19 1623

## 2019-07-22 LAB — NOVEL CORONAVIRUS, NAA: SARS-CoV-2, NAA: NOT DETECTED

## 2019-08-06 DIAGNOSIS — R42 Dizziness and giddiness: Secondary | ICD-10-CM | POA: Diagnosis not present

## 2019-08-08 ENCOUNTER — Other Ambulatory Visit: Payer: Self-pay

## 2019-08-08 ENCOUNTER — Ambulatory Visit: Payer: PPO | Admitting: Family Medicine

## 2019-08-08 ENCOUNTER — Encounter: Payer: Self-pay | Admitting: Family Medicine

## 2019-08-08 VITALS — BP 143/81 | HR 65 | Temp 96.4°F | Ht 58.75 in

## 2019-08-08 DIAGNOSIS — F411 Generalized anxiety disorder: Secondary | ICD-10-CM

## 2019-08-08 NOTE — Progress Notes (Signed)
VIRTUAL VISIT via telephone Due to national recommendations of social distancing due to Jessica Hardin 19, a virtual visit is felt to be most appropriate for this patient at this time.   I connected with the patient on 08/08/19 at  3:00 PM EST by virtual telehealth platform and verified that I am speaking with the correct person using two identifiers.   Interactive audio and video telecommunications were attempted between this provider and patient, however failed, due to patient having technical difficulties OR patient did not have access to video capability.  We continued and completed visit with audio only.   Interactive audio and video telecommunications were attempted between this provider and patient, however failed, due to patient having technical difficulties OR patient did not have access to video capability.  We continued and completed visit with audio only.    I discussed the limitations, risks, security and privacy concerns of performing an evaluation and management service by  virtual telehealth platform and the availability of in person appointments. I also discussed with the patient that there may be a patient responsible charge related to this service. The patient expressed understanding and agreed to proceed.  Patient location: Home Provider Location: [facility] [place] Participants: Eliezer Lofts and [name]   Chief Complaint  Patient presents with  . Follow-up    Anxiety    History of Present Illness:  79 year old female presents for follow up mood.  She is very confused about her meds and has not been taking the citalopram started at last OV  1 month ago. Reviewed meds and tried to instruct her  Appt. cancelled and  Changed to in person. She will bring her medications in and bring a family member with her to help remember things.   We need to repeat MMSE at that OV.  COVID 19 screen No recent travel or known exposure to COVID19 The patient denies respiratory  symptoms of COVID 19 at this time.  The importance of social distancing was discussed today.   ROS    Past Medical History:  Diagnosis Date  . Allergy   . Anxiety   . Asthma   . GERD (gastroesophageal reflux disease)   . History of chicken pox   . IBS (irritable bowel syndrome)   . Migraine   . Urinary tract infection     reports that she has never smoked. She has never used smokeless tobacco. She reports that she does not drink alcohol or use drugs.   Current Outpatient Medications:  .  amLODipine (NORVASC) 2.5 MG tablet, Take 1 tablet (2.5 mg total) by mouth daily., Disp: 30 tablet, Rfl: 3 .  hydrOXYzine (ATARAX/VISTARIL) 10 MG tablet, Take 1 tablet (10 mg total) by mouth 3 (three) times daily as needed., Disp: 30 tablet, Rfl: 0 .  latanoprost (XALATAN) 0.005 % ophthalmic solution, Place 1 drop into both eyes at bedtime., Disp: , Rfl:  .  Multiple Vitamin (MULTIVITAMIN) capsule, Take 1 capsule by mouth daily. , Disp: , Rfl:  .  Olopatadine HCl 0.6 % SOLN, Place 1-2 sprays into the nose in the morning and at bedtime., Disp: , Rfl:  .  traZODone (DESYREL) 50 MG tablet, Take 0.5-1 tablets (25-50 mg total) by mouth at bedtime as needed for sleep., Disp: 30 tablet, Rfl: 3 .  Azelastine HCl 0.15 % SOLN, Place 2 sprays into both nostrils daily., Disp: , Rfl:  .  citalopram (CELEXA) 10 MG tablet, Take 1 tablet (10 mg total) by mouth daily. (Patient  not taking: Reported on 08/08/2019), Disp: 30 tablet, Rfl: 3 .  montelukast (SINGULAIR) 10 MG tablet, Take 1 tablet (10 mg total) by mouth at bedtime. (Patient not taking: Reported on 08/08/2019), Disp: 30 tablet, Rfl: 3   Observations/Objective: Blood pressure (!) 143/81, pulse 65, temperature (!) 96.4 F (35.8 C), temperature source Oral, height 4' 10.75" (1.492 m).  Physical Exam   Assessment and Plan    I discussed the assessment and treatment plan with the patient. The patient was provided an opportunity to ask questions and all were  answered. The patient agreed with the plan and demonstrated an understanding of the instructions.   The patient was advised to call back or seek an in-person evaluation if the symptoms worsen or if the condition fails to improve as anticipated.     Eliezer Lofts, MD

## 2019-08-12 ENCOUNTER — Ambulatory Visit: Payer: PPO | Admitting: Family Medicine

## 2019-08-12 DIAGNOSIS — R42 Dizziness and giddiness: Secondary | ICD-10-CM | POA: Diagnosis not present

## 2019-08-13 DIAGNOSIS — H40003 Preglaucoma, unspecified, bilateral: Secondary | ICD-10-CM | POA: Diagnosis not present

## 2019-08-19 ENCOUNTER — Other Ambulatory Visit: Payer: Self-pay

## 2019-08-19 ENCOUNTER — Ambulatory Visit (INDEPENDENT_AMBULATORY_CARE_PROVIDER_SITE_OTHER): Payer: PPO | Admitting: Family Medicine

## 2019-08-19 VITALS — BP 122/76 | HR 66 | Temp 97.8°F | Ht 58.75 in | Wt 123.2 lb

## 2019-08-19 DIAGNOSIS — F320 Major depressive disorder, single episode, mild: Secondary | ICD-10-CM | POA: Diagnosis not present

## 2019-08-19 DIAGNOSIS — F411 Generalized anxiety disorder: Secondary | ICD-10-CM

## 2019-08-19 DIAGNOSIS — R4189 Other symptoms and signs involving cognitive functions and awareness: Secondary | ICD-10-CM

## 2019-08-19 MED ORDER — HYDROXYZINE HCL 10 MG PO TABS: 10.0000 mg | ORAL_TABLET | Freq: Three times a day (TID) | ORAL | 0 refills | Status: DC | PRN

## 2019-08-19 MED ORDER — CITALOPRAM HYDROBROMIDE 20 MG PO TABS: 10.0000 mg | ORAL_TABLET | Freq: Every day | ORAL | 11 refills | Status: DC

## 2019-08-19 NOTE — Assessment & Plan Note (Signed)
Inadequate control.. increase citalopram to 20 mg daily.

## 2019-08-19 NOTE — Progress Notes (Signed)
CC: mood/ memory  History of Present Illness: HPI   79 year old female presents for follow up with her husband. For memory and mood issues. Medications were reviewed in detail.   She is using celexa 10 mg  for anxiety and trazodone for sleep. PHQ9 SCORE ONLY 08/19/2019 07/01/2019 05/22/2019  Score 11 8 13   She is not using atarax prn. GAD 7 : Generalized Anxiety Score 08/19/2019 08/08/2019 05/22/2019 01/21/2019  Nervous, Anxious, on Edge 3 2 2 2   Control/stop worrying 2 0 2 2  Worry too much - different things 2 1 2 2   Trouble relaxing 2 1 1 2   Restless 0 0 0 1  Easily annoyed or irritable 1 1 1 2   Afraid - awful might happen 1 1 1 2   Total GAD 7 Score 11 6 9 13   Anxiety Difficulty Somewhat difficult Somewhat difficult Somewhat difficult Somewhat difficult     Her BP is controlled with amlodipine low dose  BP Readings from Last 3 Encounters:  08/19/19 122/76  08/08/19 (!) 143/81  07/21/19 (!) 168/79    She had felt her memory was worsening but now may be improving slightly with the citalopram..  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 No family history of dementia  Husband is with her .. he notes her anxiety but does not feel her memory is worsening. This visit occurred during the SARS-CoV-2 public health emergency.  Safety protocols were in place, including screening questions prior to the visit, additional usage of staff PPE, and extensive cleaning of exam room while observing appropriate contact time as indicated for disinfecting solutions.   COVID 19 screen:  No recent travel or known exposure to COVID19 The patient denies respiratory symptoms of COVID 19 at this time. The importance of social distancing was discussed today.     Review of Systems  Constitutional: Negative for chills and fever.  HENT: Negative for  congestion and ear pain.   Eyes: Negative for pain and redness.  Respiratory: Negative for cough and shortness of breath.   Cardiovascular: Negative for chest pain, palpitations and leg swelling.  Gastrointestinal: Negative for abdominal pain, blood in stool, constipation, diarrhea, nausea and vomiting.  Genitourinary: Negative for dysuria.  Musculoskeletal: Negative for falls and myalgias.  Skin: Negative for rash.  Neurological: Negative for dizziness.  Psychiatric/Behavioral: Negative for depression. The patient is not nervous/anxious.       Past Medical History:  Diagnosis Date  . Allergy   . Anxiety   . Asthma   . GERD (gastroesophageal reflux disease)   . History of chicken pox   . IBS (irritable bowel syndrome)   . Migraine   . Urinary tract infection     reports that she has never smoked. She has never used smokeless tobacco. She reports that she does not drink alcohol or use drugs.   Current Outpatient Medications:  .  amLODipine (NORVASC) 2.5 MG tablet, Take 1 tablet (2.5 mg total) by mouth daily., Disp: 30 tablet, Rfl: 3 .  citalopram (CELEXA) 10 MG tablet, Take 1 tablet (10 mg total) by mouth daily., Disp: 30 tablet, Rfl: 3 .  latanoprost (XALATAN) 0.005 % ophthalmic solution, Place 1 drop into both eyes at bedtime., Disp: , Rfl:  .  Multiple Vitamin (MULTIVITAMIN) capsule, Take 1 capsule by mouth daily. , Disp: , Rfl:  .  traZODone (DESYREL) 50 MG tablet, Take 0.5-1 tablets (25-50 mg total) by mouth at bedtime as needed for sleep., Disp: 30 tablet, Rfl: 3 .  Azelastine HCl 0.15 % SOLN, Place 2 sprays into both nostrils daily., Disp: , Rfl:  .  hydrOXYzine (ATARAX/VISTARIL) 10 MG tablet, Take 1 tablet (10 mg total) by mouth 3 (three) times daily as needed. (Patient not taking: Reported on 08/19/2019), Disp: 30 tablet, Rfl: 0 .  montelukast (SINGULAIR) 10 MG tablet, Take 1 tablet (10 mg total) by mouth at bedtime. (Patient not taking: Reported on 08/08/2019), Disp: 30 tablet,  Rfl: 3 .  Olopatadine HCl 0.6 % SOLN, Place 1-2 sprays into the nose in the morning and at bedtime., Disp: , Rfl:    Observations/Objective: Blood pressure 122/76, pulse 66, temperature 97.8 F (36.6 C), height 4' 10.75" (1.492 m), weight 123 lb 4 oz (55.9 kg), SpO2 98 %.  Physical Exam Constitutional:      General: She is not in acute distress.    Appearance: Normal appearance. She is well-developed. She is not ill-appearing or toxic-appearing.  HENT:     Head: Normocephalic.     Right Ear: Hearing, tympanic membrane, ear canal and external ear normal. Tympanic membrane is not erythematous, retracted or bulging.     Left Ear: Hearing, tympanic membrane, ear canal and external ear normal. Tympanic membrane is not erythematous, retracted or bulging.     Nose: No mucosal edema or rhinorrhea.     Right Sinus: No maxillary sinus tenderness or frontal sinus tenderness.     Left Sinus: No maxillary sinus tenderness or frontal sinus tenderness.     Mouth/Throat:     Pharynx: Uvula midline.  Eyes:     General: Lids are normal. Lids are everted, no foreign bodies appreciated.     Conjunctiva/sclera: Conjunctivae normal.     Pupils: Pupils are equal, round, and reactive to light.  Neck:     Thyroid: No thyroid mass or thyromegaly.     Vascular: No carotid bruit.     Trachea: Trachea normal.  Cardiovascular:     Rate and Rhythm: Normal rate and regular rhythm.     Pulses: Normal pulses.     Heart sounds: Normal heart sounds, S1 normal and S2 normal. No murmur. No friction rub. No gallop.   Pulmonary:     Effort: Pulmonary effort is normal. No tachypnea or respiratory distress.     Breath sounds: Normal breath sounds. No decreased breath sounds, wheezing, rhonchi or rales.  Abdominal:     General: Bowel sounds are normal.     Palpations: Abdomen is soft.     Tenderness: There is no abdominal tenderness.  Musculoskeletal:     Cervical back: Normal range of motion and neck supple.  Skin:     General: Skin is warm and dry.     Findings: No rash.  Neurological:     Mental Status: She is alert.  Psychiatric:        Mood and Affect: Mood is not anxious or depressed.        Speech: Speech normal.        Behavior: Behavior normal. Behavior is cooperative.        Thought Content: Thought content normal.        Judgment: Judgment normal.    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 it is more her percetpion of her mood that is diminished. She gets  anxious about things and this confuses her and makes her ability to handle stress poor.  MDD (major depressive disorder), single episode, mild ([facility]) Inadequate control.. increase citalopram to 20 mg daily.  GAD (generalized anxiety disorder) Use atarax as needed for panic attacks.     Eliezer Lofts, MD

## 2019-08-19 NOTE — Patient Instructions (Addendum)
Increase dose of citalopram to 20 mg daily.. I will send in prescription of new dose. This is not a high dose... far from max dose. It will take 3-4 weeks for this medicaiton to help with mood.  You can use atarax as needed for panic attacks.

## 2019-08-19 NOTE — Assessment & Plan Note (Signed)
Use atarax as needed for panic attacks.

## 2019-08-19 NOTE — Assessment & Plan Note (Signed)
Her mmeory seems pretty good per husband and testing.. it is more her percetpion of her mood that is diminished. She gets anxious about things and this confuses her and makes her ability to handle stress poor.

## 2019-08-22 ENCOUNTER — Telehealth: Payer: Self-pay

## 2019-08-22 NOTE — Telephone Encounter (Signed)
Safe to take Citalopram with the trazodone per Dr. Diona Browner.  Mrs. Sweet notified by telephone.

## 2019-08-22 NOTE — Telephone Encounter (Signed)
Pt had visit on 08/19/19; on 08/21/19 pt took citalopram 20 mg in morning and by afternoon pt was shaking. At 8 PM last night pt took Hydroxyzine 10 mg and that helped shakiness and pt went to sleep. Pt took Citalopram 20 mg this morning and now pt is feeling a little shaky. Pt wants to know if can stop Citalopram and only take the Hydroxyzine. Pt understand s the hydroxyzine is as only needed med but pt said it helps and Citalopram does not. Pt request cb. [facility] [address] if needed.

## 2019-08-22 NOTE — Telephone Encounter (Signed)
Mrs. Barret notified as instructed by telephone.  Patient seemed confused with my instruction but was finally able to repeat correct instruction back to me by the end of our conversation.

## 2019-08-22 NOTE — Telephone Encounter (Signed)
Pt called back and wanted to make sure it was safe to take the citalopram and trazodone at night. Also, she was asking about the hydroxyzine and how she can take it. I advised her what Dr Diona Browner said and told her it was as needed up to 3 times a day.   Please advise about trazodone. I told her I did not think it was a problem, but would get this to Dr Diona Browner, again.

## 2019-08-22 NOTE — Telephone Encounter (Signed)
As discussed citalopram will help over the next 3-4 weeks.. does not work immediately. It will work but she needs to give it time.   Have her take at bedtime instead to avoid any side effects  Can use the atarax if feeling anxious and shaky as needed during the day too.

## 2019-09-06 ENCOUNTER — Ambulatory Visit: Payer: PPO | Attending: Internal Medicine

## 2019-09-06 DIAGNOSIS — Z23 Encounter for immunization: Secondary | ICD-10-CM

## 2019-09-06 NOTE — Progress Notes (Signed)
   Covid-19 Vaccination [facility]  Name:  Jessica Hardin    MRN: [id number] DOB: 10/08/1940  09/06/2019  Jessica Hardin was observed post Covid-19 immunization for 15 minutes without incident. She was provided with Vaccine Information Sheet and instruction to access the V-Safe system.   Jessica Hardin was instructed to call 911 with any severe reactions post vaccine: Marland Kitchen Difficulty breathing  . Swelling of face and throat  . A fast heartbeat  . A bad rash all over body  . Dizziness and weakness   Immunizations Administered    Name Date Dose VIS Date Route   Pfizer COVID-19 Vaccine 09/06/2019  9:29 AM 0.3 mL 05/30/2019 Intramuscular   Manufacturer: [place]   Lot: [id number]   [place]: [id number]

## 2019-09-11 ENCOUNTER — Ambulatory Visit: Payer: PPO | Attending: Unknown Physician Specialty

## 2019-09-16 ENCOUNTER — Other Ambulatory Visit: Payer: Self-pay

## 2019-09-16 ENCOUNTER — Ambulatory Visit (INDEPENDENT_AMBULATORY_CARE_PROVIDER_SITE_OTHER): Payer: PPO | Admitting: Family Medicine

## 2019-09-16 ENCOUNTER — Encounter: Payer: Self-pay | Admitting: Family Medicine

## 2019-09-16 VITALS — BP 132/68 | HR 70 | Temp 98.4°F | Ht 58.75 in | Wt 123.0 lb

## 2019-09-16 DIAGNOSIS — R4189 Other symptoms and signs involving cognitive functions and awareness: Secondary | ICD-10-CM | POA: Diagnosis not present

## 2019-09-16 DIAGNOSIS — F320 Major depressive disorder, single episode, mild: Secondary | ICD-10-CM

## 2019-09-16 DIAGNOSIS — F411 Generalized anxiety disorder: Secondary | ICD-10-CM

## 2019-09-16 MED ORDER — HYDROXYZINE HCL 10 MG PO TABS: 10.0000 mg | ORAL_TABLET | Freq: Three times a day (TID) | ORAL | 0 refills | Status: DC | PRN

## 2019-09-16 MED ORDER — CITALOPRAM HYDROBROMIDE 40 MG PO TABS: 40.0000 mg | ORAL_TABLET | Freq: Every day | ORAL | 3 refills | Status: DC

## 2019-09-16 MED ORDER — TRAZODONE HCL 50 MG PO TABS: 25.0000 mg | ORAL_TABLET | Freq: Every evening | ORAL | 3 refills | Status: DC | PRN

## 2019-09-16 NOTE — Patient Instructions (Addendum)
Increase to 40 mg citalopram. The referring office will call you to set up psychiatry appointment.  Continue trazodone at night for sleep.  Can use atarax as needed for breakthrough anxiety. Keep appointment with neurology.

## 2019-09-16 NOTE — Progress Notes (Signed)
Chief Complaint  Patient presents with  . 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  She has now been on 20 mg daily of celexa for 4 weeks. No SE to this med.  Using trazodone for sleep at night.  She reports no improvement in mood at all. She is sleeping okay at night with 1/2 tablet of sleeping med at night 4-5 hours.  Memory is still as bad as before. Felt with past neurocognitive testing.  She has upcoming appt with neurologist.     This visit occurred during the SARS-CoV-2 public health emergency.  Safety protocols were in place, including screening questions prior to the visit, additional usage of staff PPE, and extensive cleaning of exam room while observing appropriate contact time as indicated for disinfecting solutions.   COVID 19 screen:  No recent travel or known exposure to COVID19 The patient denies respiratory symptoms of COVID 19 at this time. The importance of social distancing was discussed today.     Review of Systems  Constitutional: Negative for chills and fever.  HENT: Negative for congestion and ear pain.   Eyes: Negative for pain and redness.  Respiratory: Negative for cough and shortness of breath.   Cardiovascular: Negative for chest pain, palpitations and leg swelling.  Gastrointestinal: Negative for abdominal pain, blood in stool, constipation, diarrhea, nausea and vomiting.  Genitourinary: Negative for dysuria.  Musculoskeletal: Negative for falls and myalgias.  Skin: Negative for rash.  Neurological: Negative for dizziness.  Psychiatric/Behavioral: Negative for depression. The patient is not nervous/anxious.       Past Medical History:  Diagnosis Date  . Allergy   . Anxiety   . Asthma   . GERD (gastroesophageal reflux disease)   . History of chicken pox   . IBS (irritable bowel syndrome)   . Migraine   .  Urinary tract infection     reports that she has never smoked. She has never used smokeless tobacco. She reports that she does not drink alcohol or use drugs.   Current Outpatient Medications:  .  amLODipine (NORVASC) 2.5 MG tablet, Take 1 tablet (2.5 mg total) by mouth daily., Disp: 30 tablet, Rfl: 3 .  Azelastine HCl 0.15 % SOLN, Place 2 sprays into both nostrils daily., Disp: , Rfl:  .  citalopram (CELEXA) 20 MG tablet, Take 0.5 tablets (10 mg total) by mouth daily., Disp: 30 tablet, Rfl: 11 .  hydrOXYzine (ATARAX/VISTARIL) 10 MG tablet, Take 1 tablet (10 mg total) by mouth 3 (three) times daily as needed for anxiety., Disp: 30 tablet, Rfl: 0 .  latanoprost (XALATAN) 0.005 % ophthalmic solution, Place 1 drop into both eyes at bedtime., Disp: , Rfl:  .  Multiple Vitamin (MULTIVITAMIN) capsule, Take 1 capsule by mouth daily. , Disp: , Rfl:  .  Olopatadine HCl 0.6 % SOLN, Place 1-2 sprays into the nose in the morning and at bedtime., Disp: , Rfl:  .  traZODone (DESYREL) 50 MG tablet, Take 0.5-1 tablets (25-50 mg total) by mouth at bedtime as needed for sleep., Disp: 30 tablet, Rfl: 3   Observations/Objective: Blood pressure 132/68, pulse 70, temperature 98.4 F (36.9 C), temperature source Temporal, height 4' 10.75" (1.492 m), weight 123 lb (55.8 kg), SpO2 96 %.  Physical Exam Constitutional:      General: She is not in acute distress.  Appearance: Normal appearance. She is well-developed. She is not ill-appearing or toxic-appearing.  HENT:     Head: Normocephalic.     Right Ear: Hearing, tympanic membrane, ear canal and external ear normal. Tympanic membrane is not erythematous, retracted or bulging.     Left Ear: Hearing, tympanic membrane, ear canal and external ear normal. Tympanic membrane is not erythematous, retracted or bulging.     Nose: No mucosal edema or rhinorrhea.     Right Sinus: No maxillary sinus tenderness or frontal sinus tenderness.     Left Sinus: No maxillary sinus  tenderness or frontal sinus tenderness.     Mouth/Throat:     Pharynx: Uvula midline.  Eyes:     General: Lids are normal. Lids are everted, no foreign bodies appreciated.     Conjunctiva/sclera: Conjunctivae normal.     Pupils: Pupils are equal, round, and reactive to light.  Neck:     Thyroid: No thyroid mass or thyromegaly.     Vascular: No carotid bruit.     Trachea: Trachea normal.  Cardiovascular:     Rate and Rhythm: Normal rate and regular rhythm.     Pulses: Normal pulses.     Heart sounds: Normal heart sounds, S1 normal and S2 normal. No murmur. No friction rub. No gallop.   Pulmonary:     Effort: Pulmonary effort is normal. No tachypnea or respiratory distress.     Breath sounds: Normal breath sounds. No decreased breath sounds, wheezing, rhonchi or rales.  Abdominal:     General: Bowel sounds are normal.     Palpations: Abdomen is soft.     Tenderness: There is no abdominal tenderness.  Musculoskeletal:     Cervical back: Normal range of motion and neck supple.  Skin:    General: Skin is warm and dry.     Findings: No rash.  Neurological:     Mental Status: She is alert.  Psychiatric:        Mood and Affect: Mood is not anxious or depressed. Affect is flat.        Speech: Speech normal.        Behavior: Behavior normal. Behavior is cooperative.        Thought Content: Thought content normal.        Judgment: Judgment normal.      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 Agree with re-eval with neurology.  MDD (major depressive disorder), single episode, mild (HCC) Minimal improvement in mood with many different med types as well as couneling.  If memory issues are due tp mood, these cannot improve until mood improving.  Will refer to psychiatry for assistance with med management but in meantime try increase of citalopram.Continue trazodone at night for sleep.  Can use atarax as needed for breakthrough anxiety.      Eliezer Lofts, MD

## 2019-09-18 ENCOUNTER — Ambulatory Visit: Payer: PPO

## 2019-09-30 ENCOUNTER — Ambulatory Visit: Payer: PPO | Attending: Internal Medicine

## 2019-09-30 DIAGNOSIS — Z23 Encounter for immunization: Secondary | ICD-10-CM

## 2019-09-30 NOTE — Progress Notes (Signed)
   Covid-19 Vaccination Clinic  Name:  DEBRIANNA GUIER    MRN: [id number] DOB: 1941/04/27  09/30/2019  Ms. Hail was observed post Covid-19 immunization for 15 minutes without incident. She was provided with Vaccine Information Sheet and instruction to access the V-Safe system.   Ms. Kowalkowski was instructed to call 911 with any severe reactions post vaccine: Marland Kitchen Difficulty breathing  . Swelling of face and throat  . A fast heartbeat  . A bad rash all over body  . Dizziness and weakness   Immunizations Administered    Name Date Dose VIS Date Route   Pfizer COVID-19 Vaccine 09/30/2019 12:36 PM 0.3 mL 05/30/2019 Intramuscular   Manufacturer: [employer]   Lot: [id number]   [place]: [id number]

## 2019-10-08 NOTE — Assessment & Plan Note (Signed)
Minimal improvement in mood with many different med types as well as couneling.  If memory issues are due tp mood, these cannot improve until mood improving.  Will refer to psychiatry for assistance with med management but in meantime try increase of citalopram.Continue trazodone at night for sleep.  Can use atarax as needed for breakthrough anxiety.

## 2019-10-08 NOTE — Assessment & Plan Note (Signed)
Continues to worsen. Agree with re-eval with neurology.

## 2019-10-14 DIAGNOSIS — R110 Nausea: Secondary | ICD-10-CM | POA: Diagnosis not present

## 2019-10-14 DIAGNOSIS — R251 Tremor, unspecified: Secondary | ICD-10-CM | POA: Diagnosis not present

## 2019-10-14 DIAGNOSIS — R42 Dizziness and giddiness: Secondary | ICD-10-CM | POA: Diagnosis not present

## 2019-10-14 DIAGNOSIS — H9313 Tinnitus, bilateral: Secondary | ICD-10-CM | POA: Diagnosis not present

## 2019-10-14 DIAGNOSIS — R2689 Other abnormalities of gait and mobility: Secondary | ICD-10-CM | POA: Diagnosis not present

## 2019-10-21 ENCOUNTER — Telehealth: Payer: Self-pay

## 2019-10-21 NOTE — Telephone Encounter (Signed)
Patient contacted the office stating that Dr. Melrose Nakayama prescribed her Gabapentin 420mg . Patient states she is hesitant to take this just yet, as she would like Dr. Rometta Emery input on if she thinks this medication is safe for her to take, along with her other current medications? Please advise.

## 2019-10-21 NOTE — Telephone Encounter (Signed)
I reviewed Dr. Lannie Fields note.  She has dose wrong..he recommends: Start gabapentin 100 mg twice daily for two weeks, then increase to 200 mg twice daily and continue.  - Schedule EMG of bilateral legs to evaluate nerve function.  - Advised patient to complete balance exercises 2-3 times per day for 5 minutes to improve balance.   I think this is a good plan and may help her. If she is hesitent she can start more slowly with gabapentin 100 mg at night first for 1 week ( as it can make her sleep at first) , then increase to twice daily etc... or if benefit at lower dose stop.

## 2019-10-22 ENCOUNTER — Other Ambulatory Visit: Payer: Self-pay | Admitting: *Deleted

## 2019-10-22 NOTE — Telephone Encounter (Signed)
Jessica Hardin notified as instructed by telephone.  She wrote down my instructions and repeated them to me.  She will start out with slower dosing as Dr. Diona Browner suggested and will call back if she has any further questions.

## 2019-10-28 ENCOUNTER — Other Ambulatory Visit: Payer: Self-pay | Admitting: Family Medicine

## 2019-10-28 NOTE — Telephone Encounter (Signed)
Patient called today about refill request. She stated that she is completely out and has been trying to get this refilled since last week.

## 2019-10-28 NOTE — Telephone Encounter (Signed)
Last office visit 09/16/2019 for f/u mood and memory.  Last refilled 09/16/2019 for #30 with no refills.  CPE scheduled for 12/16/2019.

## 2019-10-28 NOTE — Telephone Encounter (Signed)
Mrs. Laake notified by telephone that her medication refill has been sent to [facility] on [address].

## 2019-11-11 DIAGNOSIS — R2689 Other abnormalities of gait and mobility: Secondary | ICD-10-CM | POA: Diagnosis not present

## 2019-11-20 DIAGNOSIS — F419 Anxiety disorder, unspecified: Secondary | ICD-10-CM | POA: Diagnosis not present

## 2019-11-20 DIAGNOSIS — R251 Tremor, unspecified: Secondary | ICD-10-CM | POA: Diagnosis not present

## 2019-11-20 DIAGNOSIS — H9313 Tinnitus, bilateral: Secondary | ICD-10-CM | POA: Diagnosis not present

## 2019-11-20 DIAGNOSIS — R2689 Other abnormalities of gait and mobility: Secondary | ICD-10-CM | POA: Diagnosis not present

## 2019-11-20 DIAGNOSIS — R42 Dizziness and giddiness: Secondary | ICD-10-CM | POA: Diagnosis not present

## 2019-11-20 DIAGNOSIS — M25551 Pain in right hip: Secondary | ICD-10-CM | POA: Diagnosis not present

## 2019-12-03 DIAGNOSIS — F5105 Insomnia due to other mental disorder: Secondary | ICD-10-CM | POA: Diagnosis not present

## 2019-12-03 DIAGNOSIS — F322 Major depressive disorder, single episode, severe without psychotic features: Secondary | ICD-10-CM | POA: Diagnosis not present

## 2019-12-03 DIAGNOSIS — F411 Generalized anxiety disorder: Secondary | ICD-10-CM | POA: Diagnosis not present

## 2019-12-09 ENCOUNTER — Encounter: Payer: PPO | Admitting: Family Medicine

## 2019-12-16 ENCOUNTER — Ambulatory Visit (INDEPENDENT_AMBULATORY_CARE_PROVIDER_SITE_OTHER): Payer: PPO | Admitting: Family Medicine

## 2019-12-16 ENCOUNTER — Encounter: Payer: Self-pay | Admitting: Family Medicine

## 2019-12-16 ENCOUNTER — Other Ambulatory Visit: Payer: Self-pay

## 2019-12-16 VITALS — BP 110/60 | HR 70 | Temp 97.7°F | Ht 58.5 in | Wt 120.8 lb

## 2019-12-16 DIAGNOSIS — E2839 Other primary ovarian failure: Secondary | ICD-10-CM | POA: Diagnosis not present

## 2019-12-16 DIAGNOSIS — I10 Essential (primary) hypertension: Secondary | ICD-10-CM

## 2019-12-16 DIAGNOSIS — R4189 Other symptoms and signs involving cognitive functions and awareness: Secondary | ICD-10-CM

## 2019-12-16 DIAGNOSIS — Z0000 Encounter for general adult medical examination without abnormal findings: Secondary | ICD-10-CM

## 2019-12-16 NOTE — Patient Instructions (Addendum)
Keep follow up with Dr.  Melrose Nakayama.  Hold sleep medicine and try gabapentin.   Please call the location below from the menu below to schedule your Mammogram and Bone Density appointment.     [place]  [address] [facility]   Phone:  [phone]   [place], [place] [address]                                            Services: 3D Mammogram and Bone Density

## 2019-12-16 NOTE — Progress Notes (Signed)
Chief Complaint  Patient presents with  . Medicare Wellness    History of Present Illness: HPI  The patient presents for annual medicare wellness, complete physical and review of chronic health problems. He/She also has the following acute concerns today:none  I have personally reviewed the Medicare Annual Wellness questionnaire and have noted 1. The patient's medical and social history 2. Their use of alcohol, tobacco or illicit drugs 3. Their current medications and supplements 4. The patient's functional ability including ADL's, fall risks, home safety risks and hearing or visual             impairment. 5. Diet and physical activities 6. Evidence for depression or mood disorders 7.         Updated provider list Cognitive evaluation was performed and recorded on pt medicare questionnaire form. The patients weight, height, BMI and visual acuity have been recorded in the chart  I have made referrals, counseling and provided education to the patient based review of the above and I have provided the pt with a written personalized care plan for preventive services.   Documentation of this information was scanned into the electronic record under the media tab.  Fall Risk  12/16/2019 12/03/2018 01/15/2018 01/30/2017 11/04/2015  Falls in the past year? 0 0 No No No  Comment - - pt does not remember any falls but says she might have - -  Number falls in past yr: - 0 - - -  Injury with Fall? - 0 - - -  Risk for fall due to : - - Impaired balance/gait - -     Hearing Screening   Method: Audiometry   125Hz  250Hz  500Hz  1000Hz  2000Hz  3000Hz  4000Hz  6000Hz  8000Hz   Right ear:   20 20 20  20     Left ear:   20 20 20  20     Vision Screening Comments: Wears Glasses-Eye exam scheduled for 12/24/2019 with Dr. Sandra Cockayne.    Office Visit from 09/16/2019 in [place] at [facility] Total Score 3      Advance directives and end of life planning reviewed in detail with patient and  documented in EMR. Patient given handout on advance care directives if needed. HCPOA and living will updated if needed.  Now seeing neurology Dr. Melrose Nakayama for dizziness, imbalance, tremor,memory issues. Reviewed note 11/20/2019. Started on gabapentin, referred to PT and considering aricept start at next OV in 6 weeks. She reports she has only taking gabapentin once.  11/11/19 EMG IMPRESSION: Abnormal study. 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  07/08/17 MRI BRAIN WO CONTRAST IMPRESSION: 1.  Brain volume is normal for age.  2.  Mild age-appropriate chronic microvascular ischemic change.  3.  There are no acute findings.     She is confused about her meds.. I will call a family member with her permission to request review of her medications and assistance with meds.  Hypertension:   ? If taking amlodipine. BP Readings from Last 3 Encounters:  12/16/19 110/60  09/16/19 132/68  08/19/19 122/76  Using medication without problems or lightheadedness:  occ Chest pain with exertion:none Edema:none Short of breath:none Average home BPs: Other issues:   This visit occurred during the SARS-CoV-2 public health emergency.  Safety protocols were in place, including screening questions prior to the visit, additional usage of staff PPE, and extensive cleaning of exam room while observing appropriate contact time as indicated for disinfecting solutions.   COVID 19 screen:  No  recent travel or known exposure to [place] The patient denies respiratory symptoms of COVID 19 at this time. The importance of social distancing was discussed today.     Review of Systems  Constitutional: Negative for chills and fever.  HENT: Negative for congestion and ear pain.   Eyes: Negative for pain and redness.  Respiratory: Negative for cough and shortness of breath.   Cardiovascular: Negative for chest pain, palpitations and leg swelling.  Gastrointestinal: Negative  for abdominal pain, blood in stool, constipation, diarrhea, nausea and vomiting.  Genitourinary: Negative for dysuria.  Musculoskeletal: Negative for falls and myalgias.  Skin: Negative for rash.  Neurological: Positive for dizziness.  Psychiatric/Behavioral: Positive for depression and memory loss. The patient is not nervous/anxious.       Past Medical History:  Diagnosis Date  . Allergy   . Anxiety   . Asthma   . GERD (gastroesophageal reflux disease)   . History of chicken pox   . IBS (irritable bowel syndrome)   . Migraine   . Urinary tract infection     reports that she has never smoked. She has never used smokeless tobacco. She reports that she does not drink alcohol and does not use drugs.   Current Outpatient Medications:  .  Azelastine HCl 0.15 % SOLN, Place 2 sprays into both nostrils daily., Disp: , Rfl:  .  gabapentin (NEURONTIN) 100 MG capsule, TAKE ONE CAPSULE BY MOUTH TWICE DAILY FOR TWO WEEKS. THEN INCREASE TO TAKE TWO CAPSULES BY MOUTH TWICE DAILY AND CONTINUE, Disp: , Rfl:  .  hydrOXYzine (ATARAX/VISTARIL) 10 MG tablet, TAKE 1 TABLET BY MOUTH THREE TIMES DAILY AS NEEDED FOR ANXIETY, Disp: 30 tablet, Rfl: 5 .  latanoprost (XALATAN) 0.005 % ophthalmic solution, Place 1 drop into both eyes at bedtime., Disp: , Rfl:  .  Multiple Vitamin (MULTIVITAMIN) capsule, Take 1 capsule by mouth daily. , Disp: , Rfl:  .  Olopatadine HCl 0.6 % SOLN, Place 1-2 sprays into the nose in the morning and at bedtime., Disp: , Rfl:  .  traZODone (DESYREL) 50 MG tablet, Take 0.5-1 tablets (25-50 mg total) by mouth at bedtime as needed for sleep., Disp: 30 tablet, Rfl: 3 .  amLODipine (NORVASC) 2.5 MG tablet, Take 1 tablet (2.5 mg total) by mouth daily. (Patient not taking: Reported on 12/16/2019), Disp: 30 tablet, Rfl: 3 .  citalopram (CELEXA) 40 MG tablet, Take 1 tablet (40 mg total) by mouth daily. (Patient not taking: Reported on 12/16/2019), Disp: 30 tablet, Rfl: 3    Observations/Objective: Blood pressure 110/60, pulse 70, temperature 97.7 F (36.5 C), temperature source Temporal, height 4' 10.5" (1.486 m), weight 120 lb 12 oz (54.8 kg), SpO2 96 %.  Physical Exam   Assessment and Plan   The patient's preventative maintenance and recommended screening tests for an annual wellness exam were reviewed in full today. Brought up to date unless services declined.  Counselled on the importance of diet, exercise, and its role in overall health and mortality. The patient's FH and SH was reviewed, including their home life, tobacco status, and drug and alcohol status.   Last mammo 01/2018,Sister with breast cancer, Due Colonoscopy: colonoscopynml 2008 Dr. Tiffany Kocher. Repeat was due in 5 years. Since then saw Dr. Delano Metz, [facility]. 2009 sigmoid nml!Brother with colon cancer. She is not interested in repeating PAP/DVE: TAH,not indicated DXA: 11/2015 osteopenia, repeat in 5 years Nonsmoker Vaccine: S/P COVID19 vaccine  Pseudodementia Now followed by neurology. Considering retesting with neurocognitive testing.  Essential hypertension  Well controlled. Continue current medication.     Eliezer Lofts, MD

## 2019-12-16 NOTE — Telephone Encounter (Signed)
Pt had visit on 07/01/19.

## 2019-12-18 DIAGNOSIS — F5105 Insomnia due to other mental disorder: Secondary | ICD-10-CM | POA: Diagnosis not present

## 2019-12-18 DIAGNOSIS — F411 Generalized anxiety disorder: Secondary | ICD-10-CM | POA: Diagnosis not present

## 2019-12-18 DIAGNOSIS — F322 Major depressive disorder, single episode, severe without psychotic features: Secondary | ICD-10-CM | POA: Diagnosis not present

## 2019-12-24 DIAGNOSIS — H40003 Preglaucoma, unspecified, bilateral: Secondary | ICD-10-CM | POA: Diagnosis not present

## 2019-12-29 DIAGNOSIS — H1131 Conjunctival hemorrhage, right eye: Secondary | ICD-10-CM | POA: Diagnosis not present

## 2019-12-30 DIAGNOSIS — F419 Anxiety disorder, unspecified: Secondary | ICD-10-CM | POA: Diagnosis not present

## 2019-12-30 DIAGNOSIS — R42 Dizziness and giddiness: Secondary | ICD-10-CM | POA: Diagnosis not present

## 2019-12-30 DIAGNOSIS — R251 Tremor, unspecified: Secondary | ICD-10-CM | POA: Diagnosis not present

## 2019-12-30 DIAGNOSIS — M25551 Pain in right hip: Secondary | ICD-10-CM | POA: Diagnosis not present

## 2019-12-30 DIAGNOSIS — H9313 Tinnitus, bilateral: Secondary | ICD-10-CM | POA: Diagnosis not present

## 2019-12-30 DIAGNOSIS — R2689 Other abnormalities of gait and mobility: Secondary | ICD-10-CM | POA: Diagnosis not present

## 2020-01-09 DIAGNOSIS — F411 Generalized anxiety disorder: Secondary | ICD-10-CM | POA: Diagnosis not present

## 2020-01-09 DIAGNOSIS — F5105 Insomnia due to other mental disorder: Secondary | ICD-10-CM | POA: Diagnosis not present

## 2020-01-09 DIAGNOSIS — F322 Major depressive disorder, single episode, severe without psychotic features: Secondary | ICD-10-CM | POA: Diagnosis not present

## 2020-01-21 ENCOUNTER — Telehealth: Payer: Self-pay

## 2020-01-21 NOTE — Telephone Encounter (Signed)
Spoke with Jessica Hardin.  She already has an appointment scheduled for this Friday 01/23/2020 at 2:20 pm with Dr. Diona Browner.

## 2020-01-21 NOTE — Telephone Encounter (Signed)
Pt left v/m that pt got pfizer covid vaccines on 09/06/19 & 09/30/19. Pt said for more than 3 months she had shoulder pain in both shoulders and wants to know if she should be concerned; I was going to triage pt but she said she talked with Dr Diona Browner about shoulders hurting when had visit on 12/16/19. Pt does not know outcome of what was advised to do. I could not tell from 12/16/19 visit what to advise pt. Please advise.

## 2020-01-21 NOTE — Telephone Encounter (Signed)
Needs appt for shoulder pain evaluation given persistent. Doubt related to COVID vaccine given bilateral.

## 2020-01-23 ENCOUNTER — Ambulatory Visit (INDEPENDENT_AMBULATORY_CARE_PROVIDER_SITE_OTHER): Payer: PPO | Admitting: Family Medicine

## 2020-01-23 ENCOUNTER — Other Ambulatory Visit: Payer: Self-pay

## 2020-01-23 ENCOUNTER — Ambulatory Visit (INDEPENDENT_AMBULATORY_CARE_PROVIDER_SITE_OTHER)
Admission: RE | Admit: 2020-01-23 | Discharge: 2020-01-23 | Disposition: A | Discharge status: Home / Self Care | Payer: PPO | Source: Ambulatory Visit | Attending: Family Medicine | Admitting: Family Medicine

## 2020-01-23 ENCOUNTER — Encounter: Payer: Self-pay | Admitting: Family Medicine

## 2020-01-23 VITALS — BP 140/74 | HR 73 | Temp 97.9°F | Ht 59.0 in | Wt 123.2 lb

## 2020-01-23 DIAGNOSIS — M25511 Pain in right shoulder: Secondary | ICD-10-CM

## 2020-01-23 DIAGNOSIS — M25512 Pain in left shoulder: Secondary | ICD-10-CM

## 2020-01-23 MED ORDER — MELOXICAM 7.5 MG PO TABS: 7.5000 mg | ORAL_TABLET | Freq: Every day | ORAL | 0 refills | Status: DC | PRN

## 2020-01-23 NOTE — Assessment & Plan Note (Signed)
OA vs rotator cuff tendonitis. Not to over bursa.  Will eval with X-ray of shoulder that hurts the most given age.   Treat with low dose Mobic and start home PT.

## 2020-01-23 NOTE — Progress Notes (Signed)
Chief Complaint  Patient presents with  . Shoulder Pain    Bilateral    History of Present Illness: HPI  79 year old female patient with HTN, GERD, IBS, anxiety/depression and dementia/pseudodementia  with bilateral shoulder pain.  She reports she has been noting  Bilateral shoulder pain, right worse than left. Pain is greatest with reaching up.  No numbness, no weakness, no grip strength.  No neck pain.  No pain radiating from shoulders.   Has not taken anything to treat it.  No fall associated no change in activity. No repetitive motion.  She has been seeing Dr. Melrose Nakayama for imbalance, tremor and  ? Of dementia: reviewed last OV 12/30/2019 Had SE to gabapentin. Referred to PT... she reports they have not called her yet.  Plan MMSE in 4-6 weeks at follow up.  This visit occurred during the SARS-CoV-2 public health emergency.  Safety protocols were in place, including screening questions prior to the visit, additional usage of staff PPE, and extensive cleaning of exam room while observing appropriate contact time as indicated for disinfecting solutions.   COVID 19 screen:  No recent travel or known exposure to COVID19 The patient denies respiratory symptoms of COVID 19 at this time. The importance of social distancing was discussed today.     Review of Systems  Constitutional: Negative for chills and fever.  HENT: Negative for congestion and ear pain.   Eyes: Negative for pain and redness.  Respiratory: Negative for cough and shortness of breath.   Cardiovascular: Negative for chest pain, palpitations and leg swelling.  Gastrointestinal: Negative for abdominal pain, blood in stool, constipation, diarrhea, nausea and vomiting.  Genitourinary: Negative for dysuria.  Musculoskeletal: Negative for falls and myalgias.  Skin: Negative for rash.  Neurological: Negative for dizziness.  Psychiatric/Behavioral: Negative for depression. The patient is not nervous/anxious.       Past  Medical History:  Diagnosis Date  . Allergy   . Anxiety   . Asthma   . GERD (gastroesophageal reflux disease)   . History of chicken pox   . IBS (irritable bowel syndrome)   . Migraine   . Urinary tract infection     reports that she has never smoked. She has never used smokeless tobacco. She reports that she does not drink alcohol and does not use drugs.   Current Outpatient Medications:  .  Azelastine HCl 0.15 % SOLN, Place 2 sprays into both nostrils daily., Disp: , Rfl:  .  gabapentin (NEURONTIN) 100 MG capsule, TAKE ONE CAPSULE BY MOUTH TWICE DAILY FOR TWO WEEKS. THEN INCREASE TO TAKE TWO CAPSULES BY MOUTH TWICE DAILY AND CONTINUE, Disp: , Rfl:  .  hydrOXYzine (ATARAX/VISTARIL) 10 MG tablet, TAKE 1 TABLET BY MOUTH THREE TIMES DAILY AS NEEDED FOR ANXIETY, Disp: 30 tablet, Rfl: 5 .  latanoprost (XALATAN) 0.005 % ophthalmic solution, Place 1 drop into both eyes at bedtime., Disp: , Rfl:  .  Multiple Vitamin (MULTIVITAMIN) capsule, Take 1 capsule by mouth daily. , Disp: , Rfl:  .  Olopatadine HCl 0.6 % SOLN, Place 1-2 sprays into the nose in the morning and at bedtime., Disp: , Rfl:  .  traZODone (DESYREL) 50 MG tablet, Take 0.5-1 tablets (25-50 mg total) by mouth at bedtime as needed for sleep., Disp: 30 tablet, Rfl: 3 .  amLODipine (NORVASC) 2.5 MG tablet, Take 1 tablet (2.5 mg total) by mouth daily. (Patient not taking: Reported on 01/23/2020), Disp: 30 tablet, Rfl: 3   Observations/Objective: Blood pressure 140/74, pulse  73, temperature 97.9 F (36.6 C), temperature source Temporal, height 4\' 11"  (1.499 m), weight 123 lb 4 oz (55.9 kg), SpO2 97 %.  Physical Exam Constitutional:      General: She is not in acute distress.    Appearance: Normal appearance. She is well-developed. She is not ill-appearing or toxic-appearing.  HENT:     Head: Normocephalic.     Right Ear: Hearing, tympanic membrane, ear canal and external ear normal. Tympanic membrane is not erythematous, retracted or  bulging.     Left Ear: Hearing, tympanic membrane, ear canal and external ear normal. Tympanic membrane is not erythematous, retracted or bulging.     Nose: No mucosal edema or rhinorrhea.     Right Sinus: No maxillary sinus tenderness or frontal sinus tenderness.     Left Sinus: No maxillary sinus tenderness or frontal sinus tenderness.     Mouth/Throat:     Pharynx: Uvula midline.  Eyes:     General: Lids are normal. Lids are everted, no foreign bodies appreciated.     Conjunctiva/sclera: Conjunctivae normal.     Pupils: Pupils are equal, round, and reactive to light.  Neck:     Thyroid: No thyroid mass or thyromegaly.     Vascular: No carotid bruit.     Trachea: Trachea normal.     Comments: Neg spurling's bialteral Cardiovascular:     Rate and Rhythm: Normal rate and regular rhythm.     Pulses: Normal pulses.     Heart sounds: Normal heart sounds, S1 normal and S2 normal. No murmur heard.  No friction rub. No gallop.   Pulmonary:     Effort: Pulmonary effort is normal. No tachypnea or respiratory distress.     Breath sounds: Normal breath sounds. No decreased breath sounds, wheezing, rhonchi or rales.  Abdominal:     General: Bowel sounds are normal.     Palpations: Abdomen is soft.     Tenderness: There is no abdominal tenderness.  Musculoskeletal:     Right shoulder: Tenderness present. No deformity or bony tenderness. Normal range of motion. Normal strength. Normal pulse.     Left shoulder: Tenderness present. No swelling, deformity or bony tenderness. Normal range of motion. Normal strength. Normal pulse.     Cervical back: Normal range of motion and neck supple. Muscular tenderness present. No spinous process tenderness. Normal range of motion.  Skin:    General: Skin is warm and dry.     Findings: No rash.  Neurological:     Mental Status: She is alert.  Psychiatric:        Mood and Affect: Mood is not anxious or depressed.        Speech: Speech normal.         Behavior: Behavior normal. Behavior is cooperative.        Thought Content: Thought content normal.        Judgment: Judgment normal.      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 Not to over bursa.  Will eval with X-ray of shoulder that hurts the most given age.   Treat with low dose Mobic and start home PT.        Eliezer Lofts, MD

## 2020-01-30 ENCOUNTER — Telehealth: Payer: Self-pay

## 2020-01-30 NOTE — Telephone Encounter (Signed)
She is correct.Marland Kitchen it is not a great long term medication > 80 year old.. but it can be used safely in the short term 2 weeks etc or some people need meds like that despite the increased risk.

## 2020-01-30 NOTE — Telephone Encounter (Signed)
Pt left v/m that she was reading literature about meloxicam and it said people over age of 12 should not take anti inflammatory or steroid meds for more than 7 days. Pt was given meloxicam on 01/23/20 and pt want to know if she can continue taking the meloxicam due to pt being 79 yo. Pt request cb.

## 2020-02-02 NOTE — Telephone Encounter (Signed)
Jessica Hardin notified as instructed by telephone.  Patient states she will try it for another week.

## 2020-02-10 DIAGNOSIS — F322 Major depressive disorder, single episode, severe without psychotic features: Secondary | ICD-10-CM | POA: Diagnosis not present

## 2020-02-10 DIAGNOSIS — F5105 Insomnia due to other mental disorder: Secondary | ICD-10-CM | POA: Diagnosis not present

## 2020-02-10 DIAGNOSIS — F411 Generalized anxiety disorder: Secondary | ICD-10-CM | POA: Diagnosis not present

## 2020-02-12 DIAGNOSIS — R251 Tremor, unspecified: Secondary | ICD-10-CM | POA: Diagnosis not present

## 2020-02-12 DIAGNOSIS — R2689 Other abnormalities of gait and mobility: Secondary | ICD-10-CM | POA: Diagnosis not present

## 2020-02-12 DIAGNOSIS — H9313 Tinnitus, bilateral: Secondary | ICD-10-CM | POA: Diagnosis not present

## 2020-02-12 DIAGNOSIS — F419 Anxiety disorder, unspecified: Secondary | ICD-10-CM | POA: Diagnosis not present

## 2020-02-12 DIAGNOSIS — M25551 Pain in right hip: Secondary | ICD-10-CM | POA: Diagnosis not present

## 2020-02-12 DIAGNOSIS — R42 Dizziness and giddiness: Secondary | ICD-10-CM | POA: Diagnosis not present

## 2020-02-12 NOTE — Assessment & Plan Note (Signed)
Well controlled. Continue current medication.  

## 2020-02-12 NOTE — Assessment & Plan Note (Addendum)
Now followed by neurology. Considering retesting with neurocognitive testing. Discussed medications with family member and verified med list.

## 2020-02-23 DIAGNOSIS — F5105 Insomnia due to other mental disorder: Secondary | ICD-10-CM | POA: Diagnosis not present

## 2020-02-23 DIAGNOSIS — F411 Generalized anxiety disorder: Secondary | ICD-10-CM | POA: Diagnosis not present

## 2020-02-23 DIAGNOSIS — F322 Major depressive disorder, single episode, severe without psychotic features: Secondary | ICD-10-CM | POA: Diagnosis not present

## 2020-03-19 ENCOUNTER — Ambulatory Visit
Admission: EM | Admit: 2020-03-19 | Discharge: 2020-03-19 | Disposition: A | Discharge status: Home / Self Care | Payer: PPO | Attending: Family Medicine | Admitting: Family Medicine

## 2020-03-19 DIAGNOSIS — F411 Generalized anxiety disorder: Secondary | ICD-10-CM

## 2020-03-19 DIAGNOSIS — I10 Essential (primary) hypertension: Secondary | ICD-10-CM

## 2020-03-19 MED ORDER — CLONIDINE HCL 0.1 MG PO TABS
0.1000 mg | ORAL_TABLET | Freq: Once | ORAL | Status: AC
  Administered 2020-03-19: 0.1 mg via ORAL

## 2020-03-19 NOTE — ED Provider Notes (Signed)
Jessica Hardin    CSN: [id number] Arrival date & time: 03/19/20  1706      History   Chief Complaint Chief Complaint  Patient presents with  . Hypertension  . nasal drainage    HPI Jessica Hardin is a 79 y.o. female.   Reports that she has severe anxiety.  Reports that she began taking Effexor from her primary care for about the last 4 days.  Reports that her blood pressure was very high at home today.  Reports that she got 198/91.  Upon chart review, she has history of hypertension, anxiety.  She is prescribed hydroxyzine but she has not taken this medication.  She states that she does not want to take too many medications because they may interfere with each other.  Reports that she thinks that her anxiety is coming from "the virus."  Reports that she has taken 2.5 mg of amlodipine.  Denies headache, cough, shortness of breath, nausea, vomiting, diarrhea, rash, fever, other symptoms.  ROS per HPI  The history is provided by the patient.  Hypertension    Past Medical History:  Diagnosis Date  . Allergy   . Anxiety   . Asthma   . GERD (gastroesophageal reflux disease)   . History of chicken pox   . IBS (irritable bowel syndrome)   . Migraine   . Urinary tract infection     Patient Active Problem List   Diagnosis Date Noted  . Acute pain of both shoulders 01/23/2020  . Essential hypertension 05/14/2019  . Medication side effect, initial encounter 03/20/2019  . Arthralgia 01/21/2019  . Breast mass, right 04/29/2018  . Generalized abdominal pain 02/19/2018  . GAD (generalized anxiety disorder) 10/30/2017  . 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 Chronic insomnia 10/30/2017  . Post-nasal drip 05/04/2017  . Tinnitus of right ear 05/04/2017  . Allergic rhinitis 10/05/2016  . Epidermoid cyst of finger of left hand 07/27/2016  . Osteopenia 11/18/2015  . Chronic constipation 07/20/2015  . Internal hemorrhoid, bleeding  07/20/2015  . 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 Counseling regarding end of life decision making 09/08/2014  . Childhood asthma 08/13/2014  . Gastroesophageal reflux disease without esophagitis 08/13/2014  . Irritable bowel syndrome with constipation 08/13/2014  . Other seasonal allergic rhinitis 08/13/2014  . Hx of migraines 08/13/2014  . Family history of colon cancer 08/13/2014  . Family history of uterine cancer 08/13/2014  . Family history of breast cancer 08/13/2014    Past Surgical History:  Procedure Laterality Date  . ABDOMINAL HYSTERECTOMY    . CATARACT EXTRACTION, BILATERAL    . TUBAL LIGATION      OB History   No obstetric history on file.      Home Medications    Prior to Admission medications   Medication Sig Start Date End Date Taking? Authorizing Provider  amLODipine (NORVASC) 2.5 MG tablet Take 1 tablet (2.5 mg total) by mouth daily. Patient not taking: Reported on 01/23/2020 05/14/19   Ria Bush, MD  Azelastine HCl 0.15 % SOLN Place 2 sprays into both nostrils daily. 07/01/19   [provider]  gabapentin (NEURONTIN) 100 MG capsule TAKE ONE CAPSULE BY MOUTH TWICE DAILY FOR TWO WEEKS. THEN INCREASE TO TAKE TWO CAPSULES BY MOUTH TWICE DAILY AND CONTINUE 10/15/19   [provider]  hydrOXYzine (ATARAX/VISTARIL) 10 MG tablet TAKE 1 TABLET BY MOUTH THREE TIMES DAILY AS NEEDED FOR ANXIETY 10/28/19   Bedsole, Amy E, MD  latanoprost (XALATAN) 0.005 %  ophthalmic solution Place 1 drop into both eyes at bedtime.    [provider]  meloxicam (MOBIC) 7.5 MG tablet Take 1 tablet (7.5 mg total) by mouth daily as needed for pain. 01/23/20   Bedsole, Amy E, MD  Multiple Vitamin (MULTIVITAMIN) capsule Take 1 capsule by mouth daily.     [provider]  Olopatadine HCl 0.6 % SOLN Place 1-2 sprays into the nose in the morning and at bedtime.    [provider]  traZODone (DESYREL) 50 MG tablet Take 0.5-1 tablets (25-50 mg total) by mouth  at bedtime as needed for sleep. 09/16/19   Jinny Sanders, MD    Family History Family History  Problem Relation Age of Onset  . Arthritis Mother   . Hypertension Mother   . Arthritis Sister   . Hypertension Sister   . Colon cancer Brother 29  . Breast cancer Sister   . Breast cancer Sister   . Uterine cancer Sister 20  . Multiple sclerosis Daughter     Social History Social History   Tobacco Use  . Smoking status: Never Smoker  . Smokeless tobacco: Never Used  Vaping Use  . Vaping Use: Never used  Substance Use Topics  . Alcohol use: No    Alcohol/week: 0.0 standard drinks  . Drug use: No     Allergies   Shellfish allergy   Review of Systems Review of Systems   Physical Exam Triage Vital Signs ED Triage Vitals  Enc Vitals Group     BP 03/19/20 1738 (!) 198/91     Pulse Rate 03/19/20 1738 79     Resp 03/19/20 1738 16     Temp 03/19/20 1738 99 F (37.2 C)     Temp src --      SpO2 03/19/20 1738 97 %     Weight --      Height --      Head Circumference --      Peak Flow --      Pain Score 03/19/20 1737 0     Pain Loc --      Pain Edu? --      Excl. in [place]? --    No data found.  Updated Vital Signs BP (!) 160/88   Pulse 79   Temp 99 F (37.2 C)   Resp 16   SpO2 97%   Visual Acuity Right Eye Distance:   Left Eye Distance:   Bilateral Distance:    Right Eye Near:   Left Eye Near:    Bilateral Near:     Physical Exam Vitals and nursing note reviewed.  Constitutional:      General: She is not in acute distress.    Appearance: She is well-developed.  HENT:     Head: Normocephalic and atraumatic.  Eyes:     Conjunctiva/sclera: Conjunctivae normal.  Cardiovascular:     Rate and Rhythm: Normal rate and regular rhythm.     Heart sounds: Normal heart sounds. No murmur heard.   Pulmonary:     Effort: Pulmonary effort is normal. No respiratory distress.     Breath sounds: Normal breath sounds. No stridor. No wheezing, rhonchi or rales.    Chest:     Chest wall: No tenderness.  Abdominal:     Palpations: Abdomen is soft.     Tenderness: There is no abdominal tenderness.  Musculoskeletal:        General: Normal range of motion.     Cervical back:  Normal range of motion and neck supple.  Skin:    General: Skin is warm and dry.     Capillary Refill: Capillary refill takes less than 2 seconds.  Neurological:     General: No focal deficit present.     Mental Status: She is alert and oriented to person, place, and time.  Psychiatric:        Mood and Affect: Mood normal.        Behavior: Behavior normal.        Thought Content: Thought content normal.      UC Treatments / Results  Labs (all labs ordered are listed, but only abnormal results are displayed) Labs Reviewed - No data to display  EKG   Radiology No results found.  Procedures Procedures (including critical care time)  Medications Ordered in UC Medications  cloNIDine (CATAPRES) tablet 0.1 mg (0.1 mg Oral Given 03/19/20 1801)    Initial Impression / Assessment and Plan / UC Course  I have reviewed the triage vital signs and the nursing notes.  Pertinent labs & imaging results that were available during my care of the patient were reviewed by me and considered in my medical decision making (see chart for details).    Essential Hypertension Anxiety  Presents with hypertension of 198/91  Down to 160/88 Gave clonidine 0.1mg  with good results Encourage patient to take her hydroxyzine as prescribed Discussed with patient that it will not interact with her Effexor Discussed that patient would probably benefit from talk therapy as well, she was not as interested in this Discussed that with her blood pressure as high as it was when she came in the office that she needs to be seen in the ER, states that she does not want to go to the ER Discussed with patient that if her blood pressure continues to stay high or goes back up into the 190s over 100s, she  needs to be seen in the ER  Final Clinical Impressions(s) / UC Diagnoses   Final diagnoses:  Essential hypertension  Anxiety state     Discharge Instructions     We have given you clonidine 0.1 mg in the office today to help decrease your blood pressure.  I think that when you are having an increase in anxiety like this, and your blood pressure is going up with that, it would be a good idea to take one of your hydroxyzine.  Her hydroxyzine and Effexor are different medications and they are not contraindicated to take together.  If your blood pressure is continuing to stay very high, I would have you follow-up in the ER     ED Prescriptions    None     PDMP not reviewed this encounter.   Faustino Congress, NP 03/19/20 1827

## 2020-03-19 NOTE — Discharge Instructions (Signed)
We have given you clonidine 0.1 mg in the office today to help decrease your blood pressure.  I think that when you are having an increase in anxiety like this, and your blood pressure is going up with that, it would be a good idea to take one of your hydroxyzine.  Her hydroxyzine and Effexor are different medications and they are not contraindicated to take together.  If your blood pressure is continuing to stay very high, I would have you follow-up in the ER

## 2020-03-19 NOTE — ED Triage Notes (Signed)
Patient reports her BP at home was 190s/90s. Patient also c/o nasal drainage.

## 2020-03-21 ENCOUNTER — Telehealth: Payer: Self-pay | Admitting: Family Medicine

## 2020-03-21 NOTE — Telephone Encounter (Signed)
-----   Message from Chauncey Mann, MD sent at 03/04/2020  5:24 PM EDT ----- Regarding: Neurospych testing Her insurance was not accepted at [facility] for neuropsych testing. Is it possible for you to put an internal referral in for neuropsych testing at [facility]? I am not [place] so I cannot and the place I referred does not take her insurance - Cephus Shelling, MD

## 2020-03-23 ENCOUNTER — Other Ambulatory Visit: Payer: Self-pay | Admitting: Family Medicine

## 2020-03-23 DIAGNOSIS — F5105 Insomnia due to other mental disorder: Secondary | ICD-10-CM | POA: Diagnosis not present

## 2020-03-23 DIAGNOSIS — F322 Major depressive disorder, single episode, severe without psychotic features: Secondary | ICD-10-CM | POA: Diagnosis not present

## 2020-03-23 DIAGNOSIS — F411 Generalized anxiety disorder: Secondary | ICD-10-CM | POA: Diagnosis not present

## 2020-03-23 DIAGNOSIS — R4189 Other symptoms and signs involving cognitive functions and awareness: Secondary | ICD-10-CM

## 2020-04-01 ENCOUNTER — Telehealth: Payer: Self-pay | Admitting: Family Medicine

## 2020-04-01 DIAGNOSIS — Z1231 Encounter for screening mammogram for malignant neoplasm of breast: Secondary | ICD-10-CM

## 2020-04-01 DIAGNOSIS — M8580 Other specified disorders of bone density and structure, unspecified site: Secondary | ICD-10-CM

## 2020-04-01 NOTE — Telephone Encounter (Signed)
Appeared to me these had already been ordered but given pt could not schedule I re-orders them. Pt should be able to call [facility] to schedule.

## 2020-04-01 NOTE — Telephone Encounter (Signed)
Pt called and is requesting orders for a mammogram and bone density. She tried to call and schedule these but they need the order to schedule them. Please advise. Thanks.   Nothing the referrals needs to do, patient just needs to be notified once orders are placed so that she can call and schedule her appt.

## 2020-04-02 NOTE — Telephone Encounter (Signed)
Needs appt for eval in person firt to make sure other treatment not indicated.

## 2020-04-02 NOTE — Telephone Encounter (Signed)
For insurance and assessment purposes, should she not be seen first?

## 2020-04-02 NOTE — Telephone Encounter (Signed)
Patient notified as instructed by telephone and verbalized understanding. Patient stated that she will call and schedule the test.

## 2020-04-02 NOTE — Telephone Encounter (Signed)
Pt called stating [facility] will not schedule screening mammogram.  I spoke with Roselyn Reef @ [facility] pt told her she was having breast problems.  She needs to have orders IMG [id number] IMG 5531 IMG [phone]  Spoke with pt she stated she has tender spot on right breast @ 9 oclock

## 2020-04-05 NOTE — Telephone Encounter (Signed)
Jessica Hardin, can you help her get scheduled? I was not sure who Dr Rometta Emery scheduler was. Thanks!

## 2020-04-06 NOTE — Telephone Encounter (Signed)
Left message asking pt to call office  °

## 2020-04-09 NOTE — Telephone Encounter (Signed)
10/26 appointment

## 2020-04-13 ENCOUNTER — Ambulatory Visit: Payer: PPO | Admitting: Family Medicine

## 2020-04-13 ENCOUNTER — Ambulatory Visit (INDEPENDENT_AMBULATORY_CARE_PROVIDER_SITE_OTHER): Payer: PPO | Admitting: Family Medicine

## 2020-04-13 ENCOUNTER — Other Ambulatory Visit: Payer: Self-pay

## 2020-04-13 ENCOUNTER — Encounter: Payer: Self-pay | Admitting: Family Medicine

## 2020-04-13 VITALS — BP 150/80 | HR 72 | Temp 97.9°F | Ht 58.5 in | Wt 122.2 lb

## 2020-04-13 DIAGNOSIS — N644 Mastodynia: Secondary | ICD-10-CM | POA: Insufficient documentation

## 2020-04-13 NOTE — Assessment & Plan Note (Signed)
Likely MSK but will eval with mammogram and [place] if needed to assure no change.

## 2020-04-13 NOTE — Progress Notes (Signed)
Chief Complaint  Patient presents with  . Breast Pain    Right    History of Present Illness: HPI    79 year old female with memory issues ( seeing Neuro and psych for work up.) presents for new onset right breast pain.  She reports it has been present off and on for several years.   Last mammogram 2019 done for same reason... negative.  No family  Or personal history of breast cancer  Has milder pain today... pain in right lateral breast and in axlliae.  No swelling, no skin change or redness.   She is right handed.   no caffeine.   This visit occurred during the SARS-CoV-2 public health emergency.  Safety protocols were in place, including screening questions prior to the visit, additional usage of staff PPE, and extensive cleaning of exam room while observing appropriate contact time as indicated for disinfecting solutions.   COVID 19 screen:  No recent travel or known exposure to COVID19 The patient denies respiratory symptoms of COVID 19 at this time. The importance of social distancing was discussed today.     ROS    Past Medical History:  Diagnosis Date  . Allergy   . Anxiety   . Asthma   . GERD (gastroesophageal reflux disease)   . History of chicken pox   . IBS (irritable bowel syndrome)   . Migraine   . Urinary tract infection     reports that she has never smoked. She has never used smokeless tobacco. She reports that she does not drink alcohol and does not use drugs.   Current Outpatient Medications:  .  amLODipine (NORVASC) 2.5 MG tablet, Take 1 tablet (2.5 mg total) by mouth daily. (Patient not taking: Reported on 01/23/2020), Disp: 30 tablet, Rfl: 3 .  Azelastine HCl 0.15 % SOLN, Place 2 sprays into both nostrils daily., Disp: , Rfl:  .  gabapentin (NEURONTIN) 100 MG capsule, TAKE ONE CAPSULE BY MOUTH TWICE DAILY FOR TWO WEEKS. THEN INCREASE TO TAKE TWO CAPSULES BY MOUTH TWICE DAILY AND CONTINUE, Disp: , Rfl:  .  hydrOXYzine (ATARAX/VISTARIL) 10 MG  tablet, TAKE 1 TABLET BY MOUTH THREE TIMES DAILY AS NEEDED FOR ANXIETY, Disp: 30 tablet, Rfl: 5 .  latanoprost (XALATAN) 0.005 % ophthalmic solution, Place 1 drop into both eyes at bedtime., Disp: , Rfl:  .  meloxicam (MOBIC) 7.5 MG tablet, Take 1 tablet (7.5 mg total) by mouth daily as needed for pain., Disp: 30 tablet, Rfl: 0 .  Multiple Vitamin (MULTIVITAMIN) capsule, Take 1 capsule by mouth daily. , Disp: , Rfl:  .  Olopatadine HCl 0.6 % SOLN, Place 1-2 sprays into the nose in the morning and at bedtime., Disp: , Rfl:  .  traZODone (DESYREL) 50 MG tablet, Take 0.5-1 tablets (25-50 mg total) by mouth at bedtime as needed for sleep., Disp: 30 tablet, Rfl: 3   Observations/Objective: Blood pressure (!) 150/80, pulse 72, temperature 97.9 F (36.6 C), temperature source Temporal, height 4' 10.5" (1.486 m), weight 122 lb 4 oz (55.5 kg), SpO2 96 %.  Physical Exam Constitutional:      General: She is not in acute distress.    Appearance: Normal appearance. She is well-developed. She is not ill-appearing or toxic-appearing.  HENT:     Head: Normocephalic.     Right Ear: Hearing, tympanic membrane, ear canal and external ear normal.     Left Ear: Hearing, tympanic membrane, ear canal and external ear normal.     Nose:  Nose normal.  Eyes:     General: Lids are normal. Lids are everted, no foreign bodies appreciated.     Conjunctiva/sclera: Conjunctivae normal.     Pupils: Pupils are equal, round, and reactive to light.  Neck:     Thyroid: No thyroid mass or thyromegaly.     Vascular: No carotid bruit.     Trachea: Trachea normal.  Cardiovascular:     Rate and Rhythm: Normal rate and regular rhythm.     Heart sounds: Normal heart sounds, S1 normal and S2 normal. No murmur heard.  No gallop.   Pulmonary:     Effort: Pulmonary effort is normal. No respiratory distress.     Breath sounds: Normal breath sounds. No wheezing, rhonchi or rales.  Chest:     Breasts: Breasts are symmetrical.         Right: Normal. No inverted nipple, mass, nipple discharge, skin change or tenderness.        Left: Normal. No inverted nipple, mass, nipple discharge, skin change or tenderness.  Abdominal:     General: Bowel sounds are normal. There is no distension or abdominal bruit.     Palpations: Abdomen is soft. There is no fluid wave or mass.     Tenderness: There is no abdominal tenderness. There is no guarding or rebound.     Hernia: No hernia is present.  Musculoskeletal:     Cervical back: Normal range of motion and neck supple.  Lymphadenopathy:     Cervical: No cervical adenopathy.     Upper Body:     Right upper body: No supraclavicular, axillary or pectoral adenopathy.     Left upper body: No supraclavicular, axillary or pectoral adenopathy.  Skin:    General: Skin is warm and dry.     Findings: No rash.  Neurological:     Mental Status: She is alert.     Cranial Nerves: No cranial nerve deficit.     Sensory: No sensory deficit.  Psychiatric:        Mood and Affect: Mood is not anxious or depressed.        Speech: Speech normal.        Behavior: Behavior normal. Behavior is cooperative.        Judgment: Judgment normal.      Assessment and Plan   Pain of right breast Likely MSK but will eval with mammogram and [place] if needed to assure no change.     Eliezer Lofts, MD

## 2020-04-13 NOTE — Patient Instructions (Signed)
We will call you to schedule mammogram.

## 2020-04-20 ENCOUNTER — Encounter: Payer: Self-pay | Admitting: Physical Therapy

## 2020-04-20 ENCOUNTER — Other Ambulatory Visit: Payer: Self-pay

## 2020-04-20 ENCOUNTER — Ambulatory Visit: Payer: PPO | Attending: Neurology | Admitting: Physical Therapy

## 2020-04-20 DIAGNOSIS — M6281 Muscle weakness (generalized): Secondary | ICD-10-CM | POA: Diagnosis not present

## 2020-04-20 DIAGNOSIS — R262 Difficulty in walking, not elsewhere classified: Secondary | ICD-10-CM | POA: Insufficient documentation

## 2020-04-20 DIAGNOSIS — R2689 Other abnormalities of gait and mobility: Secondary | ICD-10-CM | POA: Insufficient documentation

## 2020-04-20 NOTE — Therapy (Signed)
[place] MAIN [facility] SERVICES [address] [place], [place], [address] Phone: [phone]   Fax:  [phone]  Physical Therapy Evaluation  Patient Details  Name: Jessica Hardin MRN: [id number] Date of Birth: 1941-01-16 Referring Provider (PT): Gurney Maxin   Encounter Date: 04/20/2020   PT End of Session - 04/20/20 1122    Visit Number 1    Number of Visits 17    Date for PT Re-Evaluation 06/15/20    PT Start Time 1100    PT Stop Time 1200    PT Time Calculation (min) 60 min    Equipment Utilized During Treatment Gait belt    Activity Tolerance Patient tolerated treatment well    Behavior During Therapy [facility] for tasks assessed/performed           Past Medical History:  Diagnosis Date  . Allergy   . Anxiety   . Asthma   . GERD (gastroesophageal reflux disease)   . History of chicken pox   . IBS (irritable bowel syndrome)   . Migraine   . Urinary tract infection     Past Surgical History:  Procedure Laterality Date  . ABDOMINAL HYSTERECTOMY    . CATARACT EXTRACTION, BILATERAL    . TUBAL LIGATION      There were no vitals filed for this visit.    Subjective Assessment - 04/20/20 1112    Subjective Patient wants help with her balance    Pertinent History Pt reports that she is having trouble with her balance. She states that it started approximately one year ago without any known trigger and has been progressive since that time. She has the most difficulty with head turns while walking. She denies dizziness or vertigo..  She reports recent problems with her memory    Patient Stated Goals Improve her balance    Currently in Pain? Yes    Pain Score 4     Pain Location Knee    Pain Orientation Right    Pain Descriptors / Indicators Aching              [facility] PT Assessment - 04/20/20 0001      Assessment   Medical Diagnosis Imbalance    Referring Provider (PT) Gurney Maxin    Onset Date/Surgical Date 01/08/20     Hand Dominance Right    Prior Therapy none      Precautions   Precautions None      Restrictions   Weight Bearing Restrictions No      Balance Screen   Has the patient fallen in the past 6 months No      [place] residence    Living Arrangements Spouse/significant other    Available Help at [place] --   [place] Two level    Alternate Level Stairs-Number of Steps [place] None      Prior Function   Level of Independence Independent      Cognition   Overall Cognitive Status Within Functional Limits for tasks assessed      Berg Balance Test   Sit to [place] to stand without using hands and stabilize independently    Standing Unsupported Able to stand safely 2 minutes    Sitting with Back Unsupported but Feet Supported on Floor or Stool Able to sit safely and securely 2 minutes    Stand to Sit Sits safely  with minimal use of hands    Transfers Able to transfer safely, minor use of hands    Standing Unsupported with Eyes Closed Able to stand 10 seconds with supervision    Standing Unsupported with Feet Together Able to place feet together independently and stand 1 minute safely    From Standing, Reach Forward with Outstretched Arm Can reach confidently >25 cm (10")    From Standing Position, Pick up Object from Floor Able to pick up shoe safely and easily    From Standing Position, Turn to Look Behind Over each Shoulder Looks behind from both sides and weight shifts well    Turn 360 Degrees Able to turn 360 degrees safely in 4 seconds or less    Standing Unsupported, Alternately Place Feet on Step/Stool Able to stand independently and safely and complete 8 steps in 20 seconds    Standing Unsupported, One Foot in Front Able to place foot tandem independently and hold 30 seconds    Standing on One Leg Able to lift leg independently and hold > 10 seconds    Total Score 55                POSTURE: WNL   PROM/AROM:WNL BUE, BLE   STRENGTH:  Graded on a 0-5 scale Muscle Group Left Right                          Hip Flex -4/5 -4/5  Hip Abd -4/5 -4/5      Hip Ext 3/5 3/5      Knee Flex /45 4/5  Knee Ext 4/5 4/5  Ankle DF 4/5 4/5  Ankle PF 3+/5 3+/5   SENSATION: WNL BUE and BLE  NEUROLOGICAL SCREEN: (2+ unless otherwise noted.) N=normal  Ab=abnormal   Level Dermatome R L  C3 Anterior Neck  N N  C4 Top of Shoulder N N  C5 Lateral Upper Arm  N N  C6 Lateral Arm/ Thumb  N N  C7 Middle Finger  N N  C8 4th & 5th Finger N N  T1 Medial Arm N N  L2 Medial thigh/groin N N  L3 Lower thigh/med.knee N N  L4 Medial leg/lat thigh N N  L5 Lat. leg & dorsal foot N N  S1 post/lat foot/thigh/leg N N  S2 Post./med. thigh & leg N N    SOMATOSENSORY:  Any N & T in extremities or weakness: reports :         Sensation           Intact      Diminished         Absent  Light touch LEs                                   FUNCTIONAL MOBILITY: slow movement but independent   BALANCE: Berg Balance 55/56   GAIT: Slow gait speed , no path deviation, no AD  OUTCOME MEASURES: TEST Outcome Interpretation  5 times sit<>stand 20.51sec >38 yo, >15 sec indicates increased risk for falls  10 meter walk test   .98              m/s <1.0 m/s indicates increased risk for falls; limited community ambulator  Timed up and Go     17.54            sec <14 sec indicates increased risk for  falls      Berg Balance Assessment 55/56 <36/56 (100% risk for falls), 37-45 (80% risk for falls); 46-51 (>50% risk for falls); 52-55 (lower risk <25% of falls)        Treatment: Leg press 55 lbs x 20 x 3  Heel raises x 20 x 2  Patient performed with instruction, verbal cues, tactile cues of therapist: goal: increase tissue extensibility, promote proper posture, improve mobility        Objective measurements completed on examination: See above findings.               PT  Education - 04/20/20 1121    Education Details poc    Person(s) Educated Patient    Methods Explanation    Comprehension Verbalized understanding            PT Short Term Goals - 04/20/20 1123      PT SHORT TERM GOAL #1   Title Pt will be independent with HEP in order to improve strength and balance in order to decrease fall risk and improve function at home and work.    Time 4    Period Weeks    Status New    Target Date 05/18/20             PT Long Term Goals - 04/20/20 1124      PT LONG TERM GOAL #1   Title Pt will improve BERG by at least 3 points in order to demonstrate clinically significant improvement in balance.      Time 8    Period Weeks    Status New    Target Date 06/15/20      PT LONG TERM GOAL #2   Title Patient will increase 10 meter walk test to >1.21m/s as to improve gait speed for better community ambulation and to reduce fall risk.    Baseline .98 m/sec    Time 8    Period Weeks    Status New    Target Date 06/15/20      PT LONG TERM GOAL #3   Title Patient will reduce timed up and go to <11 seconds to reduce fall risk and demonstrate improved transfer/gait ability.    Baseline 17.54 sec    Time 8    Period Weeks    Status New    Target Date 06/15/20      PT LONG TERM GOAL #4   Title Patient (> 71 years old) will complete five times sit to stand test in < 15 seconds indicating an increased LE strength and improved balance.    Baseline 20.51 sec    Time 8    Period Weeks    Status New    Target Date 06/15/20                  Plan - 04/20/20 1123    Clinical Impression Statement Patient presents to PT evaluation for diagnosis of imbalance. She has flat affect and speaks very softly and difficult to understand. She has decreased TUG, 5 x sit to stand and decreased gait speed. She has decreased BLE strength and Berg balance is 55/56. She will benefit from skilled PT to improve strength and mobility..    Rehab Potential Good    PT  Frequency 2x / week    PT Duration 8 weeks    PT Treatment/Interventions ADLs/Self Care Home Management;Aquatic Therapy;Biofeedback;Canalith Repostioning;Cryotherapy;Iontophoresis 4mg /ml Dexamethasone;Electrical Stimulation;Moist Heat;Traction;Ultrasound;DME Instruction;Gait training;Stair training;Functional mobility training;Therapeutic activities;Therapeutic exercise;Balance training;Neuromuscular re-education;Cognitive remediation;Patient/family education;Manual  techniques;Dry needling;Energy conservation;Vestibular    PT Next Visit Plan Foto    PT Home Exercise Plan    Consulted and Agree with Plan of Care Patient           Patient will benefit from skilled therapeutic intervention in order to improve the following deficits and impairments:  Decreased balance, Difficulty walking  Visit Diagnosis: Muscle weakness (generalized)  Other abnormalities of gait and mobility  Difficulty in walking, not elsewhere classified     Problem List Patient Active Problem List   Diagnosis Date Noted  . Pain of right breast 04/13/2020  . Acute pain of both shoulders 01/23/2020  . Essential hypertension 05/14/2019  . Medication side effect, initial encounter 03/20/2019  . Arthralgia 01/21/2019  . Breast mass, right 04/29/2018  . Generalized abdominal pain 02/19/2018  . GAD (generalized anxiety disorder) 10/30/2017  . MDD (major depressive disorder), single episode, mild ([place]) 10/30/2017  . Chronic insomnia 10/30/2017  . Post-nasal drip 05/04/2017  . Tinnitus of right ear 05/04/2017  . Allergic rhinitis 10/05/2016  . Epidermoid cyst of finger of left hand 07/27/2016  . Osteopenia 11/18/2015  . Chronic constipation 07/20/2015  . Internal hemorrhoid, bleeding 07/20/2015  . 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 Counseling regarding end of life decision making 09/08/2014  . Childhood asthma 08/13/2014  . Gastroesophageal reflux disease without esophagitis 08/13/2014  . Irritable bowel syndrome with  constipation 08/13/2014  . Other seasonal allergic rhinitis 08/13/2014  . Hx of migraines 08/13/2014  . Family history of colon cancer 08/13/2014  . Family history of uterine cancer 08/13/2014  . Family history of breast cancer 08/13/2014    Alanson Puls, PT DPT 04/20/2020, 11:46 AM  [place] MAIN [facility] SERVICES [address] [place], [place], [address] Phone: [phone]   Fax:  [phone]  Name: Jessica Hardin MRN: [id number] Date of Birth: 01-22-1941

## 2020-04-22 ENCOUNTER — Ambulatory Visit: Payer: PPO | Admitting: Physical Therapy

## 2020-04-22 ENCOUNTER — Encounter: Payer: Self-pay | Admitting: Physical Therapy

## 2020-04-22 ENCOUNTER — Other Ambulatory Visit: Payer: Self-pay

## 2020-04-22 DIAGNOSIS — R262 Difficulty in walking, not elsewhere classified: Secondary | ICD-10-CM

## 2020-04-22 DIAGNOSIS — M6281 Muscle weakness (generalized): Secondary | ICD-10-CM

## 2020-04-22 DIAGNOSIS — R2689 Other abnormalities of gait and mobility: Secondary | ICD-10-CM

## 2020-04-22 NOTE — Therapy (Signed)
[place] MAIN [facility] SERVICES [address] [place], [place], [address] Phone: [phone]   Fax:  [phone]  Physical Therapy Treatment  Patient Details  Name: Jessica Hardin MRN: [id number] Date of Birth: 1941-05-21 Referring Provider (PT): Gurney Maxin   Encounter Date: 04/22/2020   PT End of Session - 04/22/20 1107    Visit Number 2    Number of Visits 17    Date for PT Re-Evaluation 06/15/20    PT Start Time 23    PT Stop Time 1140    PT Time Calculation (min) 38 min    Equipment Utilized During Treatment Gait belt    Activity Tolerance Patient tolerated treatment well    Behavior During Therapy WFL for tasks assessed/performed           Past Medical History:  Diagnosis Date   Allergy    Anxiety    Asthma    GERD (gastroesophageal reflux disease)    History of chicken pox    IBS (irritable bowel syndrome)    Migraine    Urinary tract infection     Past Surgical History:  Procedure Laterality Date   ABDOMINAL HYSTERECTOMY     CATARACT EXTRACTION, BILATERAL     TUBAL LIGATION      There were no vitals filed for this visit.   Subjective Assessment - 04/22/20 1106    Subjective Patient reports that she sometimes has pain in her L shoulder , but not at the moment.    Pertinent History Pt reports that she is having trouble with her balance. She states that it started approximately one year ago without any known trigger and has been progressive since that time. She has the most difficulty with head turns while walking. She denies dizziness or vertigo..  She reports recent problems with her memory    Limitations Walking    Diagnostic tests Abd CT: no acute changes, Brain MRI: WNL for age    Patient Stated Goals Improve her balance    Currently in Pain? No/denies    Pain Score 0-No pain    Pain Onset Yesterday             Ther-ex  Nu-step x 5 mins  Hip flexion marches with 2# ankle weights x 10 x 2  bilateral Hip abduction with 2# x 10 x 2 bilateral HS curls with 2# AW x 10 x 2 bilateral Hip extension with 2# x 10 x 2 bilateral Quantum leg press 40# x 20 x 3 sets  Squats x 15 with cues for correct posture Lunges to 6 inch stooll x 15 BLE Heel raises x 15 x 2 sets High marching x 20 BLE Step ups to 6-inch stool x 20   Patient needs occasional verbal cueing to improve posture and cueing to correctly perform exercises slowly, holding at end of range to increase motor firing of desired muscle to encourage fatigue.                          PT Education - 04/22/20 1107    Education Details HEP    Person(s) Educated Patient    Methods Explanation    Comprehension Verbalized understanding            PT Short Term Goals - 04/20/20 1123      PT SHORT TERM GOAL #1   Title Pt will be independent with HEP in order to improve strength and balance  in order to decrease fall risk and improve function at home and work.    Time 4    Period Weeks    Status New    Target Date 05/18/20             PT Long Term Goals - 04/20/20 1124      PT LONG TERM GOAL #1   Title Pt will improve BERG by at least 3 points in order to demonstrate clinically significant improvement in balance.      Time 8    Period Weeks    Status New    Target Date 06/15/20      PT LONG TERM GOAL #2   Title Patient will increase 10 meter walk test to >1.91m/s as to improve gait speed for better community ambulation and to reduce fall risk.    Baseline .98 m/sec    Time 8    Period Weeks    Status New    Target Date 06/15/20      PT LONG TERM GOAL #3   Title Patient will reduce timed up and go to <11 seconds to reduce fall risk and demonstrate improved transfer/gait ability.    Baseline 17.54 sec    Time 8    Period Weeks    Status New    Target Date 06/15/20      PT LONG TERM GOAL #4   Title Patient (> 25 years old) will complete five times sit to stand test in < 15 seconds  indicating an increased LE strength and improved balance.    Baseline 20.51 sec    Time 8    Period Weeks    Status New    Target Date 06/15/20                 Plan - 04/22/20 1108    Clinical Impression Statement  Pt was able to perform all exercises today with CGA.Marland Kitchen Pt was able to perform all balance and strength exercises, demonstrating improvements in LE strength and stability.  Pt was able to complete dynamic balance exercises, showing ability to stand on even surfaces with min assist and improve postural reactions to correct self during activities.  Pt requires verbal, visual and tactile cues during exercise in order to complete tasks with proper form and technique.  Pt would continue to benefit from skilled PT services in order to further strengthen LE's, improve static and dynamic balance, and improve coordination in order to increase functional mobility and decrease risk of falls.   Stability/Clinical Decision Making Stable/Uncomplicated    Rehab Potential Good    PT Frequency 2x / week    PT Duration 8 weeks    PT Treatment/Interventions ADLs/Self Care Home Management;Aquatic Therapy;Biofeedback;Canalith Repostioning;Cryotherapy;Iontophoresis 4mg /ml Dexamethasone;Electrical Stimulation;Moist Heat;Traction;Ultrasound;DME Instruction;Gait training;Stair training;Functional mobility training;Therapeutic activities;Therapeutic exercise;Balance training;Neuromuscular re-education;Cognitive remediation;Patient/family education;Manual techniques;Dry needling;Energy conservation;Vestibular    PT Next Visit Plan FOTO due to problem with it at evaluation    Consulted and Agree with Plan of Care Patient           Patient will benefit from skilled therapeutic intervention in order to improve the following deficits and impairments:  Decreased balance, Difficulty walking, Decreased strength, Pain  Visit Diagnosis: Muscle weakness (generalized)  Other abnormalities of gait and  mobility  Difficulty in walking, not elsewhere classified     Problem List Patient Active Problem List   Diagnosis Date Noted   Pain of right breast 04/13/2020   Acute pain of both shoulders 01/23/2020  Essential hypertension 05/14/2019   Medication side effect, initial encounter 03/20/2019   Arthralgia 01/21/2019   Breast mass, right 04/29/2018   Generalized abdominal pain 02/19/2018   GAD (generalized anxiety disorder) 10/30/2017   MDD (major depressive disorder), single episode, mild ([facility]) 10/30/2017   Chronic insomnia 10/30/2017   Post-nasal drip 05/04/2017   Tinnitus of right ear 05/04/2017   Allergic rhinitis 10/05/2016   Epidermoid cyst of finger of left hand 07/27/2016   Osteopenia 11/18/2015   Chronic constipation 07/20/2015   Internal hemorrhoid, bleeding 07/20/2015   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   Counseling regarding end of life decision making 09/08/2014   Childhood asthma 08/13/2014   Gastroesophageal reflux disease without esophagitis 08/13/2014   Irritable bowel syndrome with constipation 08/13/2014   Other seasonal allergic rhinitis 08/13/2014   Hx of migraines 08/13/2014   Family history of colon cancer 08/13/2014   Family history of uterine cancer 08/13/2014   Family history of breast cancer 08/13/2014    Alanson Puls, PT DPT 04/22/2020, 11:09 AM  [place] [address] [place], [place], [address] Phone: [phone]   Fax:  [phone]  Name: Jessica Hardin MRN: [id number] Date of Birth: [birth year]

## 2020-04-28 ENCOUNTER — Ambulatory Visit
Admission: RE | Admit: 2020-04-28 | Discharge: 2020-04-28 | Disposition: A | Discharge status: Home / Self Care | Payer: PPO | Source: Ambulatory Visit | Attending: Family Medicine | Admitting: Family Medicine

## 2020-04-28 ENCOUNTER — Other Ambulatory Visit: Payer: Self-pay

## 2020-04-28 DIAGNOSIS — N644 Mastodynia: Secondary | ICD-10-CM

## 2020-04-29 ENCOUNTER — Telehealth: Payer: Self-pay

## 2020-04-29 ENCOUNTER — Ambulatory Visit: Payer: PPO

## 2020-04-29 DIAGNOSIS — R262 Difficulty in walking, not elsewhere classified: Secondary | ICD-10-CM

## 2020-04-29 DIAGNOSIS — M6281 Muscle weakness (generalized): Secondary | ICD-10-CM

## 2020-04-29 DIAGNOSIS — R2689 Other abnormalities of gait and mobility: Secondary | ICD-10-CM

## 2020-04-29 NOTE — Telephone Encounter (Signed)
Left message for patient to call back regarding mammogram results.

## 2020-04-29 NOTE — Therapy (Signed)
[place] MAIN [facility] SERVICES [address] [place], [place], [address] Phone: [phone]   Fax:  [phone]  Physical Therapy Treatment  Patient Details  Name: Jessica Hardin MRN: [id number] Date of Birth: July 06, 1940 Referring Provider (PT): Gurney Maxin   Encounter Date: 04/29/2020   PT End of Session - 04/29/20 1017    Visit Number 3    Number of Visits 17    Date for PT Re-Evaluation 06/15/20    PT Start Time 5170    PT Stop Time 1058    PT Time Calculation (min) 44 min    Equipment Utilized During Treatment Gait belt    Activity Tolerance Patient tolerated treatment well    Behavior During Therapy [facility] for tasks assessed/performed           Past Medical History:  Diagnosis Date  . Allergy   . Anxiety   . Asthma   . GERD (gastroesophageal reflux disease)   . History of chicken pox   . IBS (irritable bowel syndrome)   . Migraine   . Urinary tract infection     Past Surgical History:  Procedure Laterality Date  . ABDOMINAL HYSTERECTOMY    . CATARACT EXTRACTION, BILATERAL    . TUBAL LIGATION      There were no vitals filed for this visit.   Subjective Assessment - 04/29/20 1018    Subjective Patient reports her R knee intermittently hurts her. No falls or LOB since last session.    Pertinent History Pt reports that she is having trouble with her balance. She states that it started approximately one year ago without any known trigger and has been progressive since that time. She has the most difficulty with head turns while walking. She denies dizziness or vertigo..  She reports recent problems with her memory    Limitations Walking    Diagnostic tests Abd CT: no acute changes, Brain MRI: WNL for age    Patient Stated Goals Improve her balance    Currently in Pain? Yes    Pain Score 2     Pain Location Knee    Pain Orientation Right    Pain Descriptors / Indicators Aching    Pain Type Chronic pain    Pain Onset  Yesterday    Pain Frequency Intermittent               blood pressure: 123/72    Ther-ex  Nu-step x lvl 5 RPM> 50 for cardiovascular and musculoskeletal challenge ; 3 minutes    10x STS with arms crossed   Holding onto // bar: -heel raise 10x BLE -toe raises 10x BLE  -lateral stepping 10x each side GTB hip extension 10x each LE  Seated: GTB alternating LAQ 10x each LE  GTB around toes march with df 12x each LE  Neuro Re-ed:   airex pad: static stand 40 second holds x2 trials  airex pad: 6" step modified tandem stance 2x 30 second holds each LE placement Airex: horizontal head turns 10x, vertical head turns 10x  airex pad: marching 12x each LE no UE support, very challenging occasional LOB  Cone taps 3 way each LE 3 x close CGA no UE support, one near LOB Lateral stepping over 5 cones , very challenging 8x length of // bars Cone tap then lateral step 5 cones, 2x length  Ambulate in hallway: close CGA and cues for sequencing and safety  -horizontal head turns 2x 86 ft -vertical head turns 2x  43 ft   Patient needs occasional verbal cueing to improve posture and cueing to correctly perform exercises slowly, holding at end of range to increase motor firing of desired muscle to encourage fatigue.                    PT Education - 04/29/20 1013    Education Details exercise technique, body mechanics    Person(s) Educated Patient    Methods Explanation;Demonstration;Tactile cues;Verbal cues    Comprehension Verbalized understanding;Returned demonstration;Verbal cues required;Tactile cues required            PT Short Term Goals - 04/20/20 1123      PT SHORT TERM GOAL #1   Title Pt will be independent with HEP in order to improve strength and balance in order to decrease fall risk and improve function at home and work.    Time 4    Period Weeks    Status New    Target Date 05/18/20             PT Long Term Goals - 04/20/20 1124      PT LONG  TERM GOAL #1   Title Pt will improve BERG by at least 3 points in order to demonstrate clinically significant improvement in balance.      Time 8    Period Weeks    Status New    Target Date 06/15/20      PT LONG TERM GOAL #2   Title Patient will increase 10 meter walk test to >1.50m/s as to improve gait speed for better community ambulation and to reduce fall risk.    Baseline .98 m/sec    Time 8    Period Weeks    Status New    Target Date 06/15/20      PT LONG TERM GOAL #3   Title Patient will reduce timed up and go to <11 seconds to reduce fall risk and demonstrate improved transfer/gait ability.    Baseline 17.54 sec    Time 8    Period Weeks    Status New    Target Date 06/15/20      PT LONG TERM GOAL #4   Title Patient (> 6 years old) will complete five times sit to stand test in < 15 seconds indicating an increased LE strength and improved balance.    Baseline 20.51 sec    Time 8    Period Weeks    Status New    Target Date 06/15/20                 Plan - 04/29/20 1313    Clinical Impression Statement Patient is motivated throughout physical therapy session. Occasional rest breaks required due to fatigue however patient demonstrates functional capacity for mobility at this time. Single limb stance is challenging for patient as well as dynamic stability with mobility with occasional LOB. Pt would continue to benefit from skilled PT services in order to further strengthen LE's, improve static and dynamic balance, and improve coordination in order to increase functional mobility and decrease risk of falls.    Stability/Clinical Decision Making Stable/Uncomplicated    Rehab Potential Good    PT Frequency 2x / week    PT Duration 8 weeks    PT Treatment/Interventions ADLs/Self Care Home Management;Aquatic Therapy;Biofeedback;Canalith Repostioning;Cryotherapy;Iontophoresis 4mg /ml Dexamethasone;Electrical Stimulation;Moist Heat;Traction;Ultrasound;DME Instruction;Gait  training;Stair training;Functional mobility training;Therapeutic activities;Therapeutic exercise;Balance training;Neuromuscular re-education;Cognitive remediation;Patient/family education;Manual techniques;Dry needling;Energy conservation;Vestibular    PT Next Visit Plan FOTO due to problem  with it at evaluation    Consulted and Agree with Plan of Care Patient           Patient will benefit from skilled therapeutic intervention in order to improve the following deficits and impairments:  Decreased balance, Difficulty walking, Decreased strength, Pain  Visit Diagnosis: Muscle weakness (generalized)  Other abnormalities of gait and mobility  Difficulty in walking, not elsewhere classified     Problem List Patient Active Problem List   Diagnosis Date Noted  . Pain of right breast 04/13/2020  . Acute pain of both shoulders 01/23/2020  . Essential hypertension 05/14/2019  . Medication side effect, initial encounter 03/20/2019  . Arthralgia 01/21/2019  . Breast mass, right 04/29/2018  . Generalized abdominal pain 02/19/2018  . GAD (generalized anxiety disorder) 10/30/2017  . MDD (major depressive disorder), single episode, mild ([place]) 10/30/2017  . Chronic insomnia 10/30/2017  . Post-nasal drip 05/04/2017  . Tinnitus of right ear 05/04/2017  . Allergic rhinitis 10/05/2016  . Epidermoid cyst of finger of left hand 07/27/2016  . Osteopenia 11/18/2015  . Chronic constipation 07/20/2015  . Internal hemorrhoid, bleeding 07/20/2015  . 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 Counseling regarding end of life decision making 09/08/2014  . Childhood asthma 08/13/2014  . Gastroesophageal reflux disease without esophagitis 08/13/2014  . Irritable bowel syndrome with constipation 08/13/2014  . Other seasonal allergic rhinitis 08/13/2014  . Hx of migraines 08/13/2014  . Family history of colon cancer 08/13/2014  . Family history of uterine cancer 08/13/2014  . Family history of breast cancer  08/13/2014   Janna Arch, PT, DPT   04/29/2020, 1:14 PM  [place] MAIN [facility] SERVICES [address] [place], [place], [address] Phone: [phone]   Fax:  [phone]  Name: Jessica Hardin MRN: [id number] Date of Birth: June 14, 1941

## 2020-04-29 NOTE — Telephone Encounter (Signed)
-----   Message from Jinny Sanders, MD sent at 04/29/2020  8:32 AM EST ----- Her mammogram is normal. She has already been notified per report.

## 2020-04-29 NOTE — Telephone Encounter (Signed)
Patient informed mammogram is normal.

## 2020-04-29 NOTE — Telephone Encounter (Signed)
Returned your call.

## 2020-05-04 ENCOUNTER — Ambulatory Visit: Payer: PPO | Admitting: Physical Therapy

## 2020-05-04 ENCOUNTER — Other Ambulatory Visit: Payer: Self-pay

## 2020-05-04 ENCOUNTER — Encounter: Payer: Self-pay | Admitting: Physical Therapy

## 2020-05-04 DIAGNOSIS — R2689 Other abnormalities of gait and mobility: Secondary | ICD-10-CM

## 2020-05-04 DIAGNOSIS — M6281 Muscle weakness (generalized): Secondary | ICD-10-CM | POA: Diagnosis not present

## 2020-05-04 DIAGNOSIS — R262 Difficulty in walking, not elsewhere classified: Secondary | ICD-10-CM

## 2020-05-04 NOTE — Therapy (Signed)
[place] MAIN [facility] SERVICES [address] [place], [place], [address] Phone: [phone]   Fax:  [phone]  Physical Therapy Treatment  Patient Details  Name: Jessica Hardin MRN: [id number] Date of Birth: 1940/11/15 Referring Provider (PT): Gurney Maxin   Encounter Date: 05/04/2020   PT End of Session - 05/04/20 0938    Visit Number 4    Number of Visits 17    Date for PT Re-Evaluation 06/15/20    PT Start Time 0935    PT Stop Time 1015    PT Time Calculation (min) 40 min    Equipment Utilized During Treatment Gait belt    Activity Tolerance Patient tolerated treatment well    Behavior During Therapy [facility] for tasks assessed/performed           Past Medical History:  Diagnosis Date  . Allergy   . Anxiety   . Asthma   . GERD (gastroesophageal reflux disease)   . History of chicken pox   . IBS (irritable bowel syndrome)   . Migraine   . Urinary tract infection     Past Surgical History:  Procedure Laterality Date  . ABDOMINAL HYSTERECTOMY    . CATARACT EXTRACTION, BILATERAL    . TUBAL LIGATION      There were no vitals filed for this visit.   Subjective Assessment - 05/04/20 0937    Subjective Patient reports her R knee intermittently hurts her. No falls or LOB since last session.    Pertinent History Pt reports that she is having trouble with her balance. She states that it started approximately one year ago without any known trigger and has been progressive since that time. She has the most difficulty with head turns while walking. She denies dizziness or vertigo..  She reports recent problems with her memory    Limitations Walking    Diagnostic tests Abd CT: no acute changes, Brain MRI: WNL for age    Patient Stated Goals Improve her balance    Currently in Pain? No/denies    Pain Score 0-No pain    Pain Onset Yesterday           Ther-ex  Nu-step L4   x 5 mins  Hip flexion marches with 2# ankle weights x  10 bilateral Hip abduction with 2# x 10 bilateral Hip extension with 2# x 10 bilateral Quantum leg press 25# x 20 x 3 sets  Lunges to BOSU ball x 15 BLE Heel raises x 15 x 2 sets Step ups to 6-inch stool x 20  Eccentric step downs from 3 inch stool x 10 verbal cues to complete slow and tap heel.  BUE CGA Patient needs occasional verbal cueing to improve posture and cueing to correctly perform exercises slowly, holding at end of range to increase motor firing of desired muscle to encourage fatigue.                           PT Education - 05/04/20 0938    Education Details HEP    Person(s) Educated Patient    Methods Explanation    Comprehension Verbal cues required;Need further instruction            PT Short Term Goals - 04/20/20 1123      PT SHORT TERM GOAL #1   Title Pt will be independent with HEP in order to improve strength and balance in order to decrease fall risk and improve  function at home and work.    Time 4    Period Weeks    Status New    Target Date 05/18/20             PT Long Term Goals - 04/20/20 1124      PT LONG TERM GOAL #1   Title Pt will improve BERG by at least 3 points in order to demonstrate clinically significant improvement in balance.      Time 8    Period Weeks    Status New    Target Date 06/15/20      PT LONG TERM GOAL #2   Title Patient will increase 10 meter walk test to >1.31m/s as to improve gait speed for better community ambulation and to reduce fall risk.    Baseline .98 m/sec    Time 8    Period Weeks    Status New    Target Date 06/15/20      PT LONG TERM GOAL #3   Title Patient will reduce timed up and go to <11 seconds to reduce fall risk and demonstrate improved transfer/gait ability.    Baseline 17.54 sec    Time 8    Period Weeks    Status New    Target Date 06/15/20      PT LONG TERM GOAL #4   Title Patient (> 42 years old) will complete five times sit to stand test in < 15 seconds  indicating an increased LE strength and improved balance.    Baseline 20.51 sec    Time 8    Period Weeks    Status New    Target Date 06/15/20                 Plan - 05/04/20 5366    Clinical Impression Statement Pt was able to progress through exercises today with decreases in loss of dynamic standing during reaching activities.  Pt continues to demonstrate improvement with dynamic and static balance, with decreased LOB noted with dynamic activities on even and uneven surfaces.  Pt has decreased strength and single leg stability.  Pt would continue to benefit from skilled therapy services in order to continue strengthening LE's and improving dynamic and static balance.   Stability/Clinical Decision Making Stable/Uncomplicated    Rehab Potential Good    PT Frequency 2x / week    PT Duration 8 weeks    PT Treatment/Interventions ADLs/Self Care Home Management;Aquatic Therapy;Biofeedback;Canalith Repostioning;Cryotherapy;Iontophoresis 4mg /ml Dexamethasone;Electrical Stimulation;Moist Heat;Traction;Ultrasound;DME Instruction;Gait training;Stair training;Functional mobility training;Therapeutic activities;Therapeutic exercise;Balance training;Neuromuscular re-education;Cognitive remediation;Patient/family education;Manual techniques;Dry needling;Energy conservation;Vestibular    PT Next Visit Plan FOTO due to problem with it at evaluation    Consulted and Agree with Plan of Care Patient           Patient will benefit from skilled therapeutic intervention in order to improve the following deficits and impairments:  Decreased balance, Difficulty walking, Decreased strength, Pain  Visit Diagnosis: Muscle weakness (generalized)  Other abnormalities of gait and mobility  Difficulty in walking, not elsewhere classified     Problem List Patient Active Problem List   Diagnosis Date Noted  . Pain of right breast 04/13/2020  . Acute pain of both shoulders 01/23/2020  . Essential  hypertension 05/14/2019  . Medication side effect, initial encounter 03/20/2019  . Arthralgia 01/21/2019  . Breast mass, right 04/29/2018  . Generalized abdominal pain 02/19/2018  . GAD (generalized anxiety disorder) 10/30/2017  . MDD (major depressive disorder), single episode, mild ([place]) 10/30/2017  .  Chronic insomnia 10/30/2017  . Post-nasal drip 05/04/2017  . Tinnitus of right ear 05/04/2017  . Allergic rhinitis 10/05/2016  . Epidermoid cyst of finger of left hand 07/27/2016  . Osteopenia 11/18/2015  . Chronic constipation 07/20/2015  . Internal hemorrhoid, bleeding 07/20/2015  . 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 Counseling regarding end of life decision making 09/08/2014  . Childhood asthma 08/13/2014  . Gastroesophageal reflux disease without esophagitis 08/13/2014  . Irritable bowel syndrome with constipation 08/13/2014  . Other seasonal allergic rhinitis 08/13/2014  . Hx of migraines 08/13/2014  . Family history of colon cancer 08/13/2014  . Family history of uterine cancer 08/13/2014  . Family history of breast cancer 08/13/2014    Alanson Puls, PT DPT 05/04/2020, 9:39 AM  [place] MAIN [facility] SERVICES [address] [place], [place], [address] Phone: [phone]   Fax:  [phone]  Name: MONQUE HAGGAR MRN: [id number] Date of Birth: [birth year]

## 2020-05-07 DIAGNOSIS — F322 Major depressive disorder, single episode, severe without psychotic features: Secondary | ICD-10-CM | POA: Diagnosis not present

## 2020-05-07 DIAGNOSIS — F5105 Insomnia due to other mental disorder: Secondary | ICD-10-CM | POA: Diagnosis not present

## 2020-05-07 DIAGNOSIS — F411 Generalized anxiety disorder: Secondary | ICD-10-CM | POA: Diagnosis not present

## 2020-05-18 ENCOUNTER — Other Ambulatory Visit: Payer: Self-pay

## 2020-05-18 ENCOUNTER — Ambulatory Visit: Payer: PPO

## 2020-05-18 DIAGNOSIS — M6281 Muscle weakness (generalized): Secondary | ICD-10-CM | POA: Diagnosis not present

## 2020-05-18 DIAGNOSIS — R2689 Other abnormalities of gait and mobility: Secondary | ICD-10-CM

## 2020-05-18 DIAGNOSIS — R262 Difficulty in walking, not elsewhere classified: Secondary | ICD-10-CM

## 2020-05-18 NOTE — Therapy (Signed)
[place] MAIN [facility] SERVICES [address] [place], [place], [address] Phone: [phone]   Fax:  [phone]  Physical Therapy Treatment  Patient Details  Name: Jessica Hardin MRN: [id number] Date of Birth: 11-Nov-1940 Referring Provider (PT): Gurney Maxin   Encounter Date: 05/18/2020   PT End of Session - 05/18/20 1032    Visit Number 5    Number of Visits 17    Date for PT Re-Evaluation 06/15/20    PT Start Time 1017   pt arrived late   PT Stop Time 1057    PT Time Calculation (min) 27 min    Equipment Utilized During Treatment Gait belt    Activity Tolerance Patient tolerated treatment well;No increased pain    Behavior During Therapy [facility] for tasks assessed/performed           Past Medical History:  Diagnosis Date  . Allergy   . Anxiety   . Asthma   . GERD (gastroesophageal reflux disease)   . History of chicken pox   . IBS (irritable bowel syndrome)   . Migraine   . Urinary tract infection     Past Surgical History:  Procedure Laterality Date  . ABDOMINAL HYSTERECTOMY    . CATARACT EXTRACTION, BILATERAL    . TUBAL LIGATION      There were no vitals filed for this visit.   Subjective Assessment - 05/18/20 1031    Subjective Pt reports intermittent compliance with HEP, no pain today. Says Right knee remains stiff. Pt says still haivng imbalance with head turns.    Pertinent History Pt reports that she is having trouble with her balance. She states that it started approximately one year ago without any known trigger and has been progressive since that time. She has the most difficulty with head turns while walking. She denies dizziness or vertigo..  She reports recent problems with her memory    Currently in Pain? No/denies           Intervention this date:  -RLE LAQ 1x10 (stiff knee) -10x STS with arms crossed  -SLS 10x5sec each leg, SW nearby for ad lib support -Narrow stance horizontal head turns  x10 -Narrow stance horizontal head turns, fast 1x10 -Narrow stance vertical head turns x15  -normal stance foam eyes closed 20x10sec (progressive improvement, but significant difficulty with proprioception)  -forward step up/down 8x, 8x c 4lb RUE, 8x c 4lb LUE  -lateral step up and down 6x, 6x 4lb RUE, 6x LUE  -Walking figure 8 c cones 4 feet apart x10  -fwd step ups aired pad x10 (leading leg ad lib)        PT Short Term Goals - 04/20/20 1123      PT SHORT TERM GOAL #1   Title Pt will be independent with HEP in order to improve strength and balance in order to decrease fall risk and improve function at home and work.    Time 4    Period Weeks    Status New    Target Date 05/18/20             PT Long Term Goals - 04/20/20 1124      PT LONG TERM GOAL #1   Title Pt will improve BERG by at least 3 points in order to demonstrate clinically significant improvement in balance.      Time 8    Period Weeks    Status New    Target Date 06/15/20  PT LONG TERM GOAL #2   Title Patient will increase 10 meter walk test to >1.37m/s as to improve gait speed for better community ambulation and to reduce fall risk.    Baseline .98 m/sec    Time 8    Period Weeks    Status New    Target Date 06/15/20      PT LONG TERM GOAL #3   Title Patient will reduce timed up and go to <11 seconds to reduce fall risk and demonstrate improved transfer/gait ability.    Baseline 17.54 sec    Time 8    Period Weeks    Status New    Target Date 06/15/20      PT LONG TERM GOAL #4   Title Patient (> 70 years old) will complete five times sit to stand test in < 15 seconds indicating an increased LE strength and improved balance.    Baseline 20.51 sec    Time 8    Period Weeks    Status New    Target Date 06/15/20                 Plan - 05/18/20 1034    Clinical Impression Statement Continued with balance interventions this session. Pt tends to still struggle with moving in planes  other than sagittal and has LOB with turning or direction changes, but not specifically with head turns alone. Pt continues to show improved equilibrium with repetition within session.    Stability/Clinical Decision Making Stable/Uncomplicated    Clinical Decision Making Low    Rehab Potential Good    PT Frequency 2x / week    PT Duration 8 weeks    PT Treatment/Interventions ADLs/Self Care Home Management;Aquatic Therapy;Biofeedback;Canalith Repostioning;Cryotherapy;Iontophoresis 4mg /ml Dexamethasone;Electrical Stimulation;Moist Heat;Traction;Ultrasound;DME Instruction;Gait training;Stair training;Functional mobility training;Therapeutic activities;Therapeutic exercise;Balance training;Neuromuscular re-education;Cognitive remediation;Patient/family education;Manual techniques;Dry needling;Energy conservation;Vestibular    PT Next Visit Plan Continue with balance training    PT Home Exercise Plan Semitandem balance with horiz head turns, full tandem static balance    Consulted and Agree with Plan of Care Patient           Patient will benefit from skilled therapeutic intervention in order to improve the following deficits and impairments:  Decreased balance, Difficulty walking, Decreased strength, Pain  Visit Diagnosis: Muscle weakness (generalized)  Other abnormalities of gait and mobility  Difficulty in walking, not elsewhere classified     Problem List Patient Active Problem List   Diagnosis Date Noted  . Pain of right breast 04/13/2020  . Acute pain of both shoulders 01/23/2020  . Essential hypertension 05/14/2019  . Medication side effect, initial encounter 03/20/2019  . Arthralgia 01/21/2019  . Breast mass, right 04/29/2018  . Generalized abdominal pain 02/19/2018  . GAD (generalized anxiety disorder) 10/30/2017  . MDD (major depressive disorder), single episode, mild ([place]) 10/30/2017  . Chronic insomnia 10/30/2017  . Post-nasal drip 05/04/2017  . Tinnitus of right ear  05/04/2017  . Allergic rhinitis 10/05/2016  . Epidermoid cyst of finger of left hand 07/27/2016  . Osteopenia 11/18/2015  . Chronic constipation 07/20/2015  . Internal hemorrhoid, bleeding 07/20/2015  . 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 Counseling regarding end of life decision making 09/08/2014  . Childhood asthma 08/13/2014  . Gastroesophageal reflux disease without esophagitis 08/13/2014  . Irritable bowel syndrome with constipation 08/13/2014  . Other seasonal allergic rhinitis 08/13/2014  . Hx of migraines 08/13/2014  . Family history of colon cancer 08/13/2014  . Family history of uterine cancer 08/13/2014  .  Family history of breast cancer 08/13/2014   11:00 AM, 05/18/20 Etta Grandchild, PT, DPT Physical Therapist - [facility]  Outpatient Physical [place] [phone]     Etta Grandchild 05/18/2020, 10:38 AM  [place] MAIN [facility] SERVICES [address] [place], [place], [address] Phone: [phone]   Fax:  [phone]  Name: ICELYNN ONKEN MRN: [id number] Date of Birth: 13-Sep-1940

## 2020-05-24 ENCOUNTER — Ambulatory Visit: Payer: PPO | Attending: Neurology

## 2020-05-24 ENCOUNTER — Encounter: Payer: Self-pay | Admitting: Physical Therapy

## 2020-05-24 ENCOUNTER — Telehealth: Payer: Self-pay | Admitting: Family Medicine

## 2020-05-24 ENCOUNTER — Other Ambulatory Visit: Payer: Self-pay

## 2020-05-24 DIAGNOSIS — R2689 Other abnormalities of gait and mobility: Secondary | ICD-10-CM | POA: Diagnosis not present

## 2020-05-24 DIAGNOSIS — R262 Difficulty in walking, not elsewhere classified: Secondary | ICD-10-CM | POA: Insufficient documentation

## 2020-05-24 DIAGNOSIS — M6281 Muscle weakness (generalized): Secondary | ICD-10-CM | POA: Diagnosis not present

## 2020-05-24 NOTE — Therapy (Signed)
[place] MAIN [facility] SERVICES [address] [place], [place], [address] Phone: [phone]   Fax:  [phone]  Physical Therapy Treatment  Patient Details  Name: Jessica Hardin MRN: [id number] Date of Birth: May 13, 1941 Referring Provider (PT): Gurney Maxin   Encounter Date: 05/24/2020   PT End of Session - 05/24/20 1106    Visit Number 6    Number of Visits 17    Date for PT Re-Evaluation 06/15/20    PT Start Time 1100    PT Stop Time 1145    PT Time Calculation (min) 45 min    Equipment Utilized During Treatment Gait belt    Activity Tolerance Patient tolerated treatment well;No increased pain    Behavior During Therapy WFL for tasks assessed/performed           Past Medical History:  Diagnosis Date  . Allergy   . Anxiety   . Asthma   . GERD (gastroesophageal reflux disease)   . History of chicken pox   . IBS (irritable bowel syndrome)   . Migraine   . Urinary tract infection     Past Surgical History:  Procedure Laterality Date  . ABDOMINAL HYSTERECTOMY    . CATARACT EXTRACTION, BILATERAL    . TUBAL LIGATION      There were no vitals filed for this visit.   Subjective Assessment - 05/24/20 1103    Subjective Pt reported R knee pain. Pt reported that she hasn't done "much" of her exercises this weekend. No falls since last PT session.    Pertinent History Pt reports that she is having trouble with her balance. She states that it started approximately one year ago without any known trigger and has been progressive since that time. She has the most difficulty with head turns while walking. She denies dizziness or vertigo..  She reports recent problems with her memory    Limitations Walking    Diagnostic tests Abd CT: no acute changes, Brain MRI: WNL for age    Patient Stated Goals Improve her balance    Currently in Pain? Yes    Pain Score 3     Pain Location Knee    Pain Orientation Right    Pain Onset Yesterday            Neuro Re-ed: CGA and cues for safety awareness and body mechanics   airex pad 6" pt progressed to tandem stance with supervision no Ue support 3x3sec ea Speed ladder: one foot each square 6x length of // bars with  No UE support Speed ladder side stepping x 6 lengths Speed ladder side stepping with YTB x 4 lengths  SLS with toe down 3x30seconds  SLS with cone tap of contralateral extremity    Therex: CGA in standing and cues for sequencing and body mechanics   toy soldier marching 4x length of // bars   [place] 5x length of // bars with BUE support, able to do one lap without UE support      2.5 ankle weight:   Straight leg hip flexion 10x each LE; 2 sets BUE support   Hip abduction 10x each LE, 2 sets BUE support    Backwards ambulation in // bars 6 lengths of // bars without support       Seated:   Sit to stand with focus on nose over toes. 10x    2.5 ankle weight:    -LAQ 10x each LE; 2 sets    Pt educated  throughout session about proper posture and technique with exercises. Improved exercise technique, movement at target joints, use of target muscles after min to mod verbal, visual, tactile cues.     Pt response/clinical impression: the pt demonstrated excellent motivation during session, little rest breaks and no sitting breaks needed. Supervision/CGA for balance activities to ensure safety. Pt did report mild increase in R knee pain (3/10  To 4/10) over session. The patient would benefit from further skilled PT intervention to continue to progress towards goals.     PT Education - 05/24/20 1105    Education Details exercise form and technique    Person(s) Educated Patient    Methods Explanation    Comprehension Verbalized understanding;Returned demonstration;Tactile cues required;Need further instruction            PT Short Term Goals - 04/20/20 1123      PT SHORT TERM GOAL #1   Title Pt will be independent with HEP in order to improve  strength and balance in order to decrease fall risk and improve function at home and work.    Time 4    Period Weeks    Status New    Target Date 05/18/20             PT Long Term Goals - 04/20/20 1124      PT LONG TERM GOAL #1   Title Pt will improve BERG by at least 3 points in order to demonstrate clinically significant improvement in balance.      Time 8    Period Weeks    Status New    Target Date 06/15/20      PT LONG TERM GOAL #2   Title Patient will increase 10 meter walk test to >1.40m/s as to improve gait speed for better community ambulation and to reduce fall risk.    Baseline .98 m/sec    Time 8    Period Weeks    Status New    Target Date 06/15/20      PT LONG TERM GOAL #3   Title Patient will reduce timed up and go to <11 seconds to reduce fall risk and demonstrate improved transfer/gait ability.    Baseline 17.54 sec    Time 8    Period Weeks    Status New    Target Date 06/15/20      PT LONG TERM GOAL #4   Title Patient (> 41 years old) will complete five times sit to stand test in < 15 seconds indicating an increased LE strength and improved balance.    Baseline 20.51 sec    Time 8    Period Weeks    Status New    Target Date 06/15/20                 Plan - 05/24/20 1106    Clinical Impression Statement the pt demonstrated excellent motivation during session, little rest breaks and no sitting breaks needed. Supervision/CGA for balance activities to ensure safety. Pt did report mild increase in R knee pain (3/10  To 4/10) over session. The patient would benefit from further skilled PT intervention to continue to progress towards goals.    Stability/Clinical Decision Making Stable/Uncomplicated    Rehab Potential Good    PT Frequency 2x / week    PT Duration 8 weeks    PT Treatment/Interventions ADLs/Self Care Home Management;Aquatic Therapy;Biofeedback;Canalith Repostioning;Cryotherapy;Iontophoresis 4mg /ml Dexamethasone;Electrical  Stimulation;Moist Heat;Traction;Ultrasound;DME Instruction;Gait training;Stair training;Functional mobility training;Therapeutic activities;Therapeutic exercise;Balance training;Neuromuscular re-education;Cognitive remediation;Patient/family education;Manual techniques;Dry  needling;Energy conservation;Vestibular    PT Next Visit Plan Continue with balance training    PT Home Exercise Plan Semitandem balance with horiz head turns, full tandem static balance    Consulted and Agree with Plan of Care Patient           Patient will benefit from skilled therapeutic intervention in order to improve the following deficits and impairments:  Decreased balance, Difficulty walking, Decreased strength, Pain  Visit Diagnosis: Muscle weakness (generalized)  Other abnormalities of gait and mobility  Difficulty in walking, not elsewhere classified     Problem List Patient Active Problem List   Diagnosis Date Noted  . Pain of right breast 04/13/2020  . Acute pain of both shoulders 01/23/2020  . Essential hypertension 05/14/2019  . Medication side effect, initial encounter 03/20/2019  . Arthralgia 01/21/2019  . Breast mass, right 04/29/2018  . Generalized abdominal pain 02/19/2018  . GAD (generalized anxiety disorder) 10/30/2017  . MDD (major depressive disorder), single episode, mild ([place]) 10/30/2017  . Chronic insomnia 10/30/2017  . Post-nasal drip 05/04/2017  . Tinnitus of right ear 05/04/2017  . Allergic rhinitis 10/05/2016  . Epidermoid cyst of finger of left hand 07/27/2016  . Osteopenia 11/18/2015  . Chronic constipation 07/20/2015  . Internal hemorrhoid, bleeding 07/20/2015  . 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 Counseling regarding end of life decision making 09/08/2014  . Childhood asthma 08/13/2014  . Gastroesophageal reflux disease without esophagitis 08/13/2014  . Irritable bowel syndrome with constipation 08/13/2014  . Other seasonal allergic rhinitis 08/13/2014  . Hx of migraines  08/13/2014  . Family history of colon cancer 08/13/2014  . Family history of uterine cancer 08/13/2014  . Family history of breast cancer 08/13/2014    Lieutenant Diego PT, DPT 11:46 AM,05/24/20   [place] MAIN [facility] SERVICES [address] [place], [place], [address] Phone: [phone]   Fax:  [phone]  Name: Jessica Hardin MRN: [id number] Date of Birth: 06-08-1941

## 2020-05-24 NOTE — Telephone Encounter (Signed)
PT CALLED IN WANTED TO KNOW IF SHE WILL ACCEPT HER DAUGTHER AS A NEW PATIENT DUE TO SHE SEES DR.BEDSOLE.

## 2020-05-25 NOTE — Telephone Encounter (Signed)
Yes, please inform Ms. Jessica Hardin that she should have her daughter contact us to make a new patient appointment if she is interested but that it will need to be out in February right now due to having one of our providers leave.

## 2020-05-25 NOTE — Telephone Encounter (Signed)
I would take this patient as a new patient but should I have them schedule far out given we are holding other new patients for a while with Debbie leaving?

## 2020-05-26 ENCOUNTER — Other Ambulatory Visit: Payer: Self-pay

## 2020-05-26 ENCOUNTER — Ambulatory Visit: Payer: PPO

## 2020-05-26 DIAGNOSIS — R2689 Other abnormalities of gait and mobility: Secondary | ICD-10-CM

## 2020-05-26 DIAGNOSIS — M6281 Muscle weakness (generalized): Secondary | ICD-10-CM | POA: Diagnosis not present

## 2020-05-26 DIAGNOSIS — R262 Difficulty in walking, not elsewhere classified: Secondary | ICD-10-CM

## 2020-05-26 NOTE — Therapy (Signed)
[place] MAIN [facility] SERVICES [address] [place], [place], [address] Phone: [phone]   Fax:  [phone]  Physical Therapy Treatment  Patient Details  Name: Jessica Hardin MRN: [id number] Date of Birth: Jan 23, 1941 Referring Provider (PT): Gurney Maxin   Encounter Date: 05/26/2020   PT End of Session - 05/26/20 1058    Visit Number 7    Number of Visits 17    Date for PT Re-Evaluation 06/15/20    PT Start Time 3762    [id number] Utilized During Treatment Gait belt    Activity Tolerance Patient tolerated treatment well;No increased pain    Behavior During Therapy WFL for tasks assessed/performed           Past Medical History:  Diagnosis Date  . Allergy   . Anxiety   . Asthma   . GERD (gastroesophageal reflux disease)   . History of chicken pox   . IBS (irritable bowel syndrome)   . Migraine   . Urinary tract infection     Past Surgical History:  Procedure Laterality Date  . ABDOMINAL HYSTERECTOMY    . CATARACT EXTRACTION, BILATERAL    . TUBAL LIGATION      There were no vitals filed for this visit.   Subjective Assessment - 05/26/20 1058    Subjective The patient reoprts she is doing well and was able to walk around the park [place] without difficulty.    Pertinent History Pt reports that she is having trouble with her balance. She states that it started approximately one year ago without any known trigger and has been progressive since that time. She has the most difficulty with head turns while walking. She denies dizziness or vertigo..  She reports recent problems with her memory    Limitations Walking    Diagnostic tests Abd CT: no acute changes, Brain MRI: WNL for age    Patient Stated Goals Improve her balance    Currently in Pain? No/denies    Pain Score 0-No pain    Pain Onset Yesterday               Interventions Neuro Re-ed: CGA and cues for safety awareness and body mechanics   Speed  ladder: one foot each square 6x length of // bars with fwd/bwd, side stepping with no UE support SLS with foot on ball x30seconds  SLS with cone tap of contralateral extremity Airex balance beam tandem walk x3 laps Airex pad static tandem balance 30"x2 each Toy soldier marching 4x length of // bar Backwards ambulation in // bars 6 lengths of // bars without support       Therex: CGA in standing and cues for sequencing and body mechanics   2.5 ankle weight:  March x20 each BUE support Hip abduction x20 each BUE support   Hip extension x20 each bilateral UE support       2.5 ankle weight:     -LAQ 10x each LE; 2 sets     Pt educated throughout session about proper posture and technique with exercises. Improved exercise technique, movement at target joints, use of target muscles after min to mod verbal, visual, tactile cues.                      PT Education - 05/26/20 1132    Education Details posture, balance, excercise technique    Person(s) Educated Patient    Methods Explanation;Demonstration    Comprehension Verbalized understanding  PT Short Term Goals - 04/20/20 1123      PT SHORT TERM GOAL #1   Title Pt will be independent with HEP in order to improve strength and balance in order to decrease fall risk and improve function at home and work.    Time 4    Period Weeks    Status New    Target Date 05/18/20             PT Long Term Goals - 04/20/20 1124      PT LONG TERM GOAL #1   Title Pt will improve BERG by at least 3 points in order to demonstrate clinically significant improvement in balance.      Time 8    Period Weeks    Status New    Target Date 06/15/20      PT LONG TERM GOAL #2   Title Patient will increase 10 meter walk test to >1.34m/s as to improve gait speed for better community ambulation and to reduce fall risk.    Baseline .98 m/sec    Time 8    Period Weeks    Status New    Target Date 06/15/20      PT LONG  TERM GOAL #3   Title Patient will reduce timed up and go to <11 seconds to reduce fall risk and demonstrate improved transfer/gait ability.    Baseline 17.54 sec    Time 8    Period Weeks    Status New    Target Date 06/15/20      PT LONG TERM GOAL #4   Title Patient (> 59 years old) will complete five times sit to stand test in < 15 seconds indicating an increased LE strength and improved balance.    Baseline 20.51 sec    Time 8    Period Weeks    Status New    Target Date 06/15/20                 Plan - 05/26/20 1133    Clinical Impression Statement Patient motivated during session.  The patient required close supervision for balance on level surfaces and CGA for uneven surface exercises for safety.  The patient tolerated treatment well and was appropriately challenged.  The patient continues to benefit from additional skilled PT intervention to improve stability for decreased fall risk and improved quality of life.    Stability/Clinical Decision Making Stable/Uncomplicated    Rehab Potential Good    PT Frequency 2x / week    PT Duration 8 weeks    PT Treatment/Interventions ADLs/Self Care Home Management;Aquatic Therapy;Biofeedback;Canalith Repostioning;Cryotherapy;Iontophoresis 4mg /ml Dexamethasone;Electrical Stimulation;Moist Heat;Traction;Ultrasound;DME Instruction;Gait training;Stair training;Functional mobility training;Therapeutic activities;Therapeutic exercise;Balance training;Neuromuscular re-education;Cognitive remediation;Patient/family education;Manual techniques;Dry needling;Energy conservation;Vestibular    PT Next Visit Plan Continue with balance training    PT Home Exercise Plan Semitandem balance with horiz head turns, full tandem static balance    Consulted and Agree with Plan of Care Patient           Patient will benefit from skilled therapeutic intervention in order to improve the following deficits and impairments:  Decreased balance, Difficulty  walking, Decreased strength, Pain  Visit Diagnosis: Muscle weakness (generalized)  Other abnormalities of gait and mobility  Difficulty in walking, not elsewhere classified     Problem List Patient Active Problem List   Diagnosis Date Noted  . Pain of right breast 04/13/2020  . Acute pain of both shoulders 01/23/2020  . Essential hypertension 05/14/2019  . Medication side  effect, initial encounter 03/20/2019  . Arthralgia 01/21/2019  . Breast mass, right 04/29/2018  . Generalized abdominal pain 02/19/2018  . GAD (generalized anxiety disorder) 10/30/2017  . MDD (major depressive disorder), single episode, mild ([place]) 10/30/2017  . Chronic insomnia 10/30/2017  . Post-nasal drip 05/04/2017  . Tinnitus of right ear 05/04/2017  . Allergic rhinitis 10/05/2016  . Epidermoid cyst of finger of left hand 07/27/2016  . Osteopenia 11/18/2015  . Chronic constipation 07/20/2015  . Internal hemorrhoid, bleeding 07/20/2015  . 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 Counseling regarding end of life decision making 09/08/2014  . Childhood asthma 08/13/2014  . Gastroesophageal reflux disease without esophagitis 08/13/2014  . Irritable bowel syndrome with constipation 08/13/2014  . Other seasonal allergic rhinitis 08/13/2014  . Hx of migraines 08/13/2014  . Family history of colon cancer 08/13/2014  . Family history of uterine cancer 08/13/2014  . Family history of breast cancer 08/13/2014    Hal Morales  PT, DPT 05/26/2020, 11:57 AM  [place] MAIN [facility] SERVICES [address] [place], [place], [address] Phone: [phone]   Fax:  [phone]  Name: Jessica Hardin MRN: [id number] Date of Birth: April 17, 1941

## 2020-05-27 DIAGNOSIS — F322 Major depressive disorder, single episode, severe without psychotic features: Secondary | ICD-10-CM | POA: Diagnosis not present

## 2020-05-27 DIAGNOSIS — F411 Generalized anxiety disorder: Secondary | ICD-10-CM | POA: Diagnosis not present

## 2020-05-27 DIAGNOSIS — F5105 Insomnia due to other mental disorder: Secondary | ICD-10-CM | POA: Diagnosis not present

## 2020-05-31 ENCOUNTER — Other Ambulatory Visit: Payer: Self-pay

## 2020-05-31 ENCOUNTER — Ambulatory Visit: Payer: PPO

## 2020-05-31 DIAGNOSIS — R2689 Other abnormalities of gait and mobility: Secondary | ICD-10-CM

## 2020-05-31 DIAGNOSIS — M6281 Muscle weakness (generalized): Secondary | ICD-10-CM

## 2020-05-31 DIAGNOSIS — R262 Difficulty in walking, not elsewhere classified: Secondary | ICD-10-CM

## 2020-05-31 NOTE — Telephone Encounter (Signed)
Called pt and let her know. She states she will have her daughter call our office.

## 2020-05-31 NOTE — Therapy (Signed)
[place] MAIN [facility] SERVICES [address] [place], [place], [address] Phone: [phone]   Fax:  [phone]  Physical Therapy Treatment  Patient Details  Name: Jessica Hardin MRN: [id number] Date of Birth: 1940-10-29 Referring Provider (PT): Gurney Maxin   Encounter Date: 05/31/2020   PT End of Session - 05/31/20 1120    Visit Number 8    Number of Visits 17    Date for PT Re-Evaluation 06/15/20    PT Start Time 1115    PT Stop Time 1155    PT Time Calculation (min) 40 min    Activity Tolerance Patient tolerated treatment well;No increased pain    Behavior During Therapy WFL for tasks assessed/performed           Past Medical History:  Diagnosis Date  . Allergy   . Anxiety   . Asthma   . GERD (gastroesophageal reflux disease)   . History of chicken pox   . IBS (irritable bowel syndrome)   . Migraine   . Urinary tract infection     Past Surgical History:  Procedure Laterality Date  . ABDOMINAL HYSTERECTOMY    . CATARACT EXTRACTION, BILATERAL    . TUBAL LIGATION      There were no vitals filed for this visit.   Subjective Assessment - 05/31/20 1119    Subjective Pt reports doing well today. Pt denies any recent falls/stumbles since prior session. Pt denies any updates to medications or medical appointment since prior session. Pt reports good compliance with HEP when time permits. Thus far pt continues to notice improvements with ease of ADL task performance.       Pertinent History Pt reports that she is having trouble with her balance. She states that it started approximately one year ago without any known trigger and has been progressive since that time. She has the most difficulty with head turns while walking. She denies dizziness or vertigo..  She reports recent problems with her memory    Currently in Pain? No/denies           Entire session performed in target of motor control training, spatial awareness. Pt  purposefully given interventions to create frequent LOB to allow for high volume practice of righting strategies with physical assistance provided only as needed. Entire session performed with gait belt donned and minGuard assist of patient.    INTERVENTION THIS DATE: -SLS 5x30sec bilat, firm surface (<10 LOB)  -full tandem stance on foam 2x30secH  (<3 LOB) -single foot hedgehog taps on foam surface 2x20 bilat  -eyes closed narrow stance on foam 5x30sec (improved, but remains highly provocative)  -Lateral side stepping in agility ladder 2x bilat, then 2x bilat with lateral cable resistance 7.5lb (rest between sets)  -Lateral side stepping drill, 7.5lb resistance (lateral/fwd/lateralback) 1x each direction  -ROckerboard static balance with 2000g ball toss/catch 10x sagittal, 10x frontal      PT Education - 05/31/20 1120    Education Details Ways to improve balance at home.    Person(s) Educated Patient    Methods Explanation;Demonstration    Comprehension Verbalized understanding;Returned demonstration;Need further instruction            PT Short Term Goals - 04/20/20 1123      PT SHORT TERM GOAL #1   Title Pt will be independent with HEP in order to improve strength and balance in order to decrease fall risk and improve function at home and work.    Time 4  Period Weeks    Status New    Target Date 05/18/20             PT Long Term Goals - 04/20/20 1124      PT LONG TERM GOAL #1   Title Pt will improve BERG by at least 3 points in order to demonstrate clinically significant improvement in balance.      Time 8    Period Weeks    Status New    Target Date 06/15/20      PT LONG TERM GOAL #2   Title Patient will increase 10 meter walk test to >1.15m/s as to improve gait speed for better community ambulation and to reduce fall risk.    Baseline .98 m/sec    Time 8    Period Weeks    Status New    Target Date 06/15/20      PT LONG TERM GOAL #3   Title Patient will  reduce timed up and go to <11 seconds to reduce fall risk and demonstrate improved transfer/gait ability.    Baseline 17.54 sec    Time 8    Period Weeks    Status New    Target Date 06/15/20      PT LONG TERM GOAL #4   Title Patient (> 80 years old) will complete five times sit to stand test in < 15 seconds indicating an increased LE strength and improved balance.    Baseline 20.51 sec    Time 8    Period Weeks    Status New    Target Date 06/15/20                 Plan - 05/31/20 1121    Clinical Impression Statement Continued with current plan of care as laid out in evaluation and recent prior sessions. Pt remains motivated to advance progress toward goals in order to maximize independence and safety at home. Pt requires high level assistance and cuing for completion of exercises in order to provide adequate level of stimulation and perturbation. Author allows pt as much opportunity as possible to perform independent righting strategies, only stepping in when pt is unable to prevent falling to floor. Pt closely monitored throughout session for safe vitals response and to maximize patient safety during interventions. Pt continues to demonstrate progress toward goals AEB progression of some interventions this date either in volume or intensity.   Stability/Clinical Decision Making Stable/Uncomplicated    Rehab Potential Good    PT Frequency 2x / week    PT Duration 8 weeks    PT Treatment/Interventions ADLs/Self Care Home Management;Aquatic Therapy;Biofeedback;Canalith Repostioning;Cryotherapy;Iontophoresis 4mg /ml Dexamethasone;Electrical Stimulation;Moist Heat;Traction;Ultrasound;DME Instruction;Gait training;Stair training;Functional mobility training;Therapeutic activities;Therapeutic exercise;Balance training;Neuromuscular re-education;Cognitive remediation;Patient/family education;Manual techniques;Dry needling;Energy conservation;Vestibular    PT Next Visit Plan Continue with  balance training    PT Home Exercise Plan Semitandem balance with horiz head turns, full tandem static balance    Consulted and Agree with Plan of Care Patient           Patient will benefit from skilled therapeutic intervention in order to improve the following deficits and impairments:  Decreased balance,Difficulty walking,Decreased strength,Pain  Visit Diagnosis: Muscle weakness (generalized)  Other abnormalities of gait and mobility  Difficulty in walking, not elsewhere classified     Problem List Patient Active Problem List   Diagnosis Date Noted  . Pain of right breast 04/13/2020  . Acute pain of both shoulders 01/23/2020  . Essential hypertension 05/14/2019  . Medication side effect,  initial encounter 03/20/2019  . Arthralgia 01/21/2019  . Breast mass, right 04/29/2018  . Generalized abdominal pain 02/19/2018  . GAD (generalized anxiety disorder) 10/30/2017  . MDD (major depressive disorder), single episode, mild ([place]) 10/30/2017  . Chronic insomnia 10/30/2017  . Post-nasal drip 05/04/2017  . Tinnitus of right ear 05/04/2017  . Allergic rhinitis 10/05/2016  . Epidermoid cyst of finger of left hand 07/27/2016  . Osteopenia 11/18/2015  . Chronic constipation 07/20/2015  . Internal hemorrhoid, bleeding 07/20/2015  . 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 Counseling regarding end of life decision making 09/08/2014  . Childhood asthma 08/13/2014  . Gastroesophageal reflux disease without esophagitis 08/13/2014  . Irritable bowel syndrome with constipation 08/13/2014  . Other seasonal allergic rhinitis 08/13/2014  . Hx of migraines 08/13/2014  . Family history of colon cancer 08/13/2014  . Family history of uterine cancer 08/13/2014  . Family history of breast cancer 08/13/2014   12:02 PM, 05/31/20 Etta Grandchild, PT, DPT Physical Therapist - [place] [phone]     Etta Grandchild 05/31/2020, [place] MAIN [facility] SERVICES [address] [place], [place], [address] Phone: [phone]   Fax:  [phone]  Name: Jessica Hardin MRN: [id number] Date of Birth: September 09, 1940

## 2020-06-01 ENCOUNTER — Telehealth: Payer: Self-pay | Admitting: Family Medicine

## 2020-06-01 NOTE — Telephone Encounter (Signed)
[name] AND MRS Jessica Hardin CALLED IN WANTED TO KNOW ABOUT GETTING A MEMORY TEST DONE HER PSYCHIATRIST DR. KAPUR IS WANTING HER TO GET IT DONE. NEEDS A REFERRAL FOR THE TEST.  PLEASE ADVISE

## 2020-06-01 NOTE — Telephone Encounter (Signed)
Can you give me an update on the neuropsych referral status?  I had already placed this but I think there were issues.

## 2020-06-02 ENCOUNTER — Other Ambulatory Visit: Payer: Self-pay

## 2020-06-02 ENCOUNTER — Ambulatory Visit: Payer: PPO

## 2020-06-02 DIAGNOSIS — R2689 Other abnormalities of gait and mobility: Secondary | ICD-10-CM

## 2020-06-02 DIAGNOSIS — M6281 Muscle weakness (generalized): Secondary | ICD-10-CM

## 2020-06-02 DIAGNOSIS — R262 Difficulty in walking, not elsewhere classified: Secondary | ICD-10-CM

## 2020-06-02 NOTE — Telephone Encounter (Signed)
Pt was supposed to call her insurance to see who is in Network - everywhere that we have tried does not accept her insurance. See Referral in system

## 2020-06-02 NOTE — Therapy (Signed)
[place] MAIN [facility] SERVICES [address] [place], [place], [address] Phone: [phone]   Fax:  [phone]  Physical Therapy Treatment  Patient Details  Name: Jessica Hardin MRN: [id number] Date of Birth: 1940-12-31 Referring Provider (PT): Gurney Maxin   Encounter Date: 06/02/2020   PT End of Session - 06/02/20 1109    Visit Number 9    Number of Visits 17    Date for PT Re-Evaluation 06/15/20    PT Start Time 0263    PT Stop Time 1145    PT Time Calculation (min) 40 min    Equipment Utilized During Treatment Gait belt    Activity Tolerance Patient tolerated treatment well;No increased pain    Behavior During Therapy WFL for tasks assessed/performed           Past Medical History:  Diagnosis Date  . Allergy   . Anxiety   . Asthma   . GERD (gastroesophageal reflux disease)   . History of chicken pox   . IBS (irritable bowel syndrome)   . Migraine   . Urinary tract infection     Past Surgical History:  Procedure Laterality Date  . ABDOMINAL HYSTERECTOMY    . CATARACT EXTRACTION, BILATERAL    . TUBAL LIGATION      There were no vitals filed for this visit.   Subjective Assessment - 06/02/20 1107    Subjective The patient reports being a little dizzy from adjusting her medication and is contacting her doctor today.    Pertinent History Pt reports that she is having trouble with her balance. She states that it started approximately one year ago without any known trigger and has been progressive since that time. She has the most difficulty with head turns while walking. She denies dizziness or vertigo..  She reports recent problems with her memory                Neuro Re-ed: CGA and cues for safety awareness and body mechanics   Airex balance beam tandem walk f/b x3 laps Airex pad 6" pt progressed to tandem stance with supervision no Ue support 3x3sec ea Airex NBOS ballon hits 2 mins Speed ladder: one foot  each square 6x length of // bars with no UE support Speed ladder side stepping x 6 lengths   SLS no UE support 2x30seconds each SLS with cone tap of contralateral extremity f/s/b x5 each side [place] along length of // bars x4 no Ue support -Rockerboard static balance with 2000g ball toss/catch f/b x10 red foam ball s/s  x10                          PT Education - 06/02/20 1154    Education Details Balance, taking time with exercises    Person(s) Educated Patient    Methods Explanation    Comprehension Verbalized understanding            PT Short Term Goals - 04/20/20 1123      PT SHORT TERM GOAL #1   Title Pt will be independent with HEP in order to improve strength and balance in order to decrease fall risk and improve function at home and work.    Time 4    Period Weeks    Status New    Target Date 05/18/20             PT Long Term Goals - 04/20/20 1124  PT LONG TERM GOAL #1   Title Pt will improve BERG by at least 3 points in order to demonstrate clinically significant improvement in balance.      Time 8    Period Weeks    Status New    Target Date 06/15/20      PT LONG TERM GOAL #2   Title Patient will increase 10 meter walk test to >1.17m/s as to improve gait speed for better community ambulation and to reduce fall risk.    Baseline .98 m/sec    Time 8    Period Weeks    Status New    Target Date 06/15/20      PT LONG TERM GOAL #3   Title Patient will reduce timed up and go to <11 seconds to reduce fall risk and demonstrate improved transfer/gait ability.    Baseline 17.54 sec    Time 8    Period Weeks    Status New    Target Date 06/15/20      PT LONG TERM GOAL #4   Title Patient (> 80 years old) will complete five times sit to stand test in < 15 seconds indicating an increased LE strength and improved balance.    Baseline 20.51 sec    Time 8    Period Weeks    Status New    Target Date 06/15/20                  Plan - 06/02/20 1155    Clinical Impression Statement The patient was able to SLS 30 seconds bilateral LEs without UE support.  The patient was a little more unsteady on uneven surfaces requiring CGA.  Patient continues to benefit from additional skilled PT intervention to further challenged balance and strengthen LEs for improved quality of life.    Stability/Clinical Decision Making Stable/Uncomplicated    Rehab Potential Good    PT Frequency 2x / week    PT Duration 8 weeks    PT Treatment/Interventions ADLs/Self Care Home Management;Aquatic Therapy;Biofeedback;Canalith Repostioning;Cryotherapy;Iontophoresis 4mg /ml Dexamethasone;Electrical Stimulation;Moist Heat;Traction;Ultrasound;DME Instruction;Gait training;Stair training;Functional mobility training;Therapeutic activities;Therapeutic exercise;Balance training;Neuromuscular re-education;Cognitive remediation;Patient/family education;Manual techniques;Dry needling;Energy conservation;Vestibular    PT Next Visit Plan Continue with balance training    PT Home Exercise Plan Semitandem balance with horiz head turns, full tandem static balance    Consulted and Agree with Plan of Care Patient           Patient will benefit from skilled therapeutic intervention in order to improve the following deficits and impairments:  Decreased balance,Difficulty walking,Decreased strength,Pain  Visit Diagnosis: Muscle weakness (generalized)  Other abnormalities of gait and mobility  Difficulty in walking, not elsewhere classified     Problem List Patient Active Problem List   Diagnosis Date Noted  . Pain of right breast 04/13/2020  . Acute pain of both shoulders 01/23/2020  . Essential hypertension 05/14/2019  . Medication side effect, initial encounter 03/20/2019  . Arthralgia 01/21/2019  . Breast mass, right 04/29/2018  . Generalized abdominal pain 02/19/2018  . GAD (generalized anxiety disorder) 10/30/2017  . MDD (major depressive  disorder), single episode, mild ([place]) 10/30/2017  . Chronic insomnia 10/30/2017  . Post-nasal drip 05/04/2017  . Tinnitus of right ear 05/04/2017  . Allergic rhinitis 10/05/2016  . Epidermoid cyst of finger of left hand 07/27/2016  . Osteopenia 11/18/2015  . Chronic constipation 07/20/2015  . Internal hemorrhoid, bleeding 07/20/2015  . 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 Counseling regarding end of life decision making 09/08/2014  . Childhood  asthma 08/13/2014  . Gastroesophageal reflux disease without esophagitis 08/13/2014  . Irritable bowel syndrome with constipation 08/13/2014  . Other seasonal allergic rhinitis 08/13/2014  . Hx of migraines 08/13/2014  . Family history of colon cancer 08/13/2014  . Family history of uterine cancer 08/13/2014  . Family history of breast cancer 08/13/2014    Hal Morales PT, DPT 06/02/2020, 12:01 PM  [place] MAIN [facility] SERVICES [address] [place], [place], [address] Phone: [phone]   Fax:  [phone]  Name: Jessica Hardin MRN: [id number] Date of Birth: Oct 27, 1940

## 2020-06-02 NOTE — Telephone Encounter (Signed)
Called and left message for Jessica Hardin to call back about this. I also called patient and confirmed that Jessica Hardin is her daughter and I reminded patient that she was suppose to call her insurance to find out who is in her network. Advised patient I will wait to hear back from her with this information

## 2020-06-03 NOTE — Telephone Encounter (Signed)
Spoke with Erline Levine and advised her of this information also.

## 2020-06-04 NOTE — Telephone Encounter (Signed)
[place] Night - Client Nonclinical Telephone Record AccessNurse Client [place] Night - Client Client Site [place] Physician Eliezer Lofts - MD Contact Type Call Who Is Calling Patient / Member / Family / Caregiver Caller Name [name] Patient Name Jessica Hardin Patient DOB 1940/11/05 Call Type Message Only Information Provided Reason for Call Request for General Office Information Initial Comment Caller states she needs a referral for a memory test. There is a location in [place] at [facility] re[facility] that take their insurance that they would prefer (neuro physiatrist) Additional Comment Provided with office hours. Disp. Time Disposition Final User 06/03/2020 5:45:37 PM General Information Provided Yes Sonny Dandy Call Closed By: Sonny Dandy Transaction Date/Time: 06/03/2020 5:33:14 PM (ET)

## 2020-06-04 NOTE — Telephone Encounter (Signed)
Spoke with Jessica Hardin. I left message for [facility] and [facility] main office for neuropsychiatry to see if they would be able to see patient and how far out they are scheduling.  I did send information to [facility] Neuropsychiatry office yesterday to review because they wanted to get referral and process insurance to see if they can take it before accepting referral 100%. Spoke with [facility] office and they can see patient but not till around first of May 2022. [place] and Neuropsyciatry office in [place] are not able to see patient for this. Jessica Hardin states they are willing to travel to [place], [place], [place] but rather not go to [place] or [place] area. Jessica Hardin is aware that I will keep her posted as I know more.

## 2020-06-04 NOTE — Telephone Encounter (Signed)
Spoke with Erline Levine and she said [facility] in [place] has neuropsychiatrist that could see patient. She will call me back with the phone number.

## 2020-06-07 ENCOUNTER — Other Ambulatory Visit: Payer: Self-pay

## 2020-06-07 ENCOUNTER — Ambulatory Visit: Payer: PPO

## 2020-06-07 DIAGNOSIS — R262 Difficulty in walking, not elsewhere classified: Secondary | ICD-10-CM

## 2020-06-07 DIAGNOSIS — R2689 Other abnormalities of gait and mobility: Secondary | ICD-10-CM

## 2020-06-07 DIAGNOSIS — M6281 Muscle weakness (generalized): Secondary | ICD-10-CM | POA: Diagnosis not present

## 2020-06-07 NOTE — Telephone Encounter (Signed)
Left detailed message for Jessica Hardin letting her know that Jessica Hardin has neuropsychiatry and cognitive testing departments and I faxed over referral to them to review and schedule. They are booking new patients in 2 to 3 months right now. I also spoke with Dr Nicolasa Ducking and made her aware of all of these details.

## 2020-06-07 NOTE — Therapy (Signed)
[place] MAIN [facility] SERVICES [address] [place], [place], [address] Phone: [phone]   Fax:  [phone]  Physical Therapy Treatment Physical Therapy Progress Note   Dates of reporting period  04/20/20   to   06/07/20  Patient Details  Name: Jessica Hardin MRN: [id number] Date of Birth: 1941/02/12 Referring Provider (PT): Gurney Maxin   Encounter Date: 06/07/2020   PT End of Session - 06/07/20 1208    Visit Number 10    Number of Visits 17    Date for PT Re-Evaluation 06/15/20    PT Start Time 1220    PT Stop Time 1250    PT Time Calculation (min) 30 min    Equipment Utilized During Treatment Gait belt    Activity Tolerance Patient tolerated treatment well;No increased pain    Behavior During Therapy WFL for tasks assessed/performed           Past Medical History:  Diagnosis Date  . Allergy   . Anxiety   . Asthma   . GERD (gastroesophageal reflux disease)   . History of chicken pox   . IBS (irritable bowel syndrome)   . Migraine   . Urinary tract infection     Past Surgical History:  Procedure Laterality Date  . ABDOMINAL HYSTERECTOMY    . CATARACT EXTRACTION, BILATERAL    . TUBAL LIGATION      There were no vitals filed for this visit.   Subjective Assessment - 06/07/20 1207    Subjective Patient reports she is getting better.    Pertinent History Pt reports that she is having trouble with her balance. She states that it started approximately one year ago without any known trigger and has been progressive since that time. She has the most difficulty with head turns while walking. She denies dizziness or vertigo..  She reports recent problems with her memory    Currently in Pain? No/denies    Pain Score 0-No pain               Neuro Re-ed: CGA and cues for safety awareness and body mechanics   Airex balance beam tandem walk f/b x5 laps Airex balance beam side stepping 3 laps EO, 2 laps EC Airex  tandem balance 30"x2 Airex NBOS static stance EC 30"x2 Airex NBOS basketball/darts Rocker board WS f/b and s/s.  Static balance each direction x1 min   Berg to be performed next visit.                          PT Short Term Goals - 06/07/20 1208      PT SHORT TERM GOAL #1   Title Pt will be independent with HEP in order to improve strength and balance in order to decrease fall risk and improve function at home and work.    Baseline 06/07/20  pt performing activities at home to help with balance, purchases cones to tap    Time 4    Period Weeks    Status Partially Met    Target Date 05/18/20             PT Long Term Goals - 06/07/20 1209      PT LONG TERM GOAL #1   Title Pt will improve BERG by at least 3 points in order to demonstrate clinically significant improvement in balance.      Baseline 06/07/20 will assess next visit    Time 8  Period Weeks    Status On-going      PT LONG TERM GOAL #2   Title Patient will increase 10 meter walk test to >1.68m/s as to improve gait speed for better community ambulation and to reduce fall risk.    Baseline .98 m/sec    Time 8    Period Weeks    Status Partially Met      PT LONG TERM GOAL #3   Title Patient will reduce timed up and go to <11 seconds to reduce fall risk and demonstrate improved transfer/gait ability.    Baseline 17.54 sec    Time 8    Period Weeks    Status Partially Met      PT LONG TERM GOAL #4   Title Patient (> 15 years old) will complete five times sit to stand test in < 15 seconds indicating an increased LE strength and improved balance.    Baseline 20.51 sec    Time 8    Period Weeks    Status Partially Met                 Plan - 06/07/20 1212    Clinical Impression Statement The patient is making progress overall with improved gait speed and sit to stand tests since initial evalation.  Berg Balance Scale will be assessed next visit as part of patient's treatment to  evaluate her progress towards her reduction in fall risk.  The patient required light CGA for balance activities today and is showing better stability on uneven surfaces.  Patient's condition has the potential to improve in response to therapy. Maximum improvement is yet to be obtained. The anticipated improvement is attainable and reasonable in a generally predictable time.    Stability/Clinical Decision Making Stable/Uncomplicated    Rehab Potential Good    PT Frequency 2x / week    PT Duration 8 weeks    PT Treatment/Interventions ADLs/Self Care Home Management;Aquatic Therapy;Biofeedback;Canalith Repostioning;Cryotherapy;Iontophoresis 4mg /ml Dexamethasone;Electrical Stimulation;Moist Heat;Traction;Ultrasound;DME Instruction;Gait training;Stair training;Functional mobility training;Therapeutic activities;Therapeutic exercise;Balance training;Neuromuscular re-education;Cognitive remediation;Patient/family education;Manual techniques;Dry needling;Energy conservation;Vestibular    PT Next Visit Plan Continue with balance training    PT Home Exercise Plan Semitandem balance with horiz head turns, full tandem static balance    Consulted and Agree with Plan of Care Patient           Patient will benefit from skilled therapeutic intervention in order to improve the following deficits and impairments:  Decreased balance,Difficulty walking,Decreased strength,Pain  Visit Diagnosis: Muscle weakness (generalized)  Other abnormalities of gait and mobility  Difficulty in walking, not elsewhere classified     Problem List Patient Active Problem List   Diagnosis Date Noted  . Pain of right breast 04/13/2020  . Acute pain of both shoulders 01/23/2020  . Essential hypertension 05/14/2019  . Medication side effect, initial encounter 03/20/2019  . Arthralgia 01/21/2019  . Breast mass, right 04/29/2018  . Generalized abdominal pain 02/19/2018  . GAD (generalized anxiety disorder) 10/30/2017  . MDD  (major depressive disorder), single episode, mild ([place]) 10/30/2017  . Chronic insomnia 10/30/2017  . Post-nasal drip 05/04/2017  . Tinnitus of right ear 05/04/2017  . Allergic rhinitis 10/05/2016  . Epidermoid cyst of finger of left hand 07/27/2016  . Osteopenia 11/18/2015  . Chronic constipation 07/20/2015  . Internal hemorrhoid, bleeding 07/20/2015  . 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 Counseling regarding end of life decision making 09/08/2014  . Childhood asthma 08/13/2014  . Gastroesophageal reflux disease without esophagitis 08/13/2014  . Irritable bowel  syndrome with constipation 08/13/2014  . Other seasonal allergic rhinitis 08/13/2014  . Hx of migraines 08/13/2014  . Family history of colon cancer 08/13/2014  . Family history of uterine cancer 08/13/2014  . Family history of breast cancer 08/13/2014    Hal Morales PT, DPT 06/07/2020, 12:15 PM  [place] MAIN [facility] SERVICES [place], [place], [address] Phone: [phone]   Fax:  [phone]  Name: Jessica Hardin MRN: [id number] Date of Birth: 18-Mar-1941

## 2020-06-09 ENCOUNTER — Ambulatory Visit: Payer: PPO

## 2020-06-09 ENCOUNTER — Other Ambulatory Visit: Payer: Self-pay

## 2020-06-09 DIAGNOSIS — R262 Difficulty in walking, not elsewhere classified: Secondary | ICD-10-CM

## 2020-06-09 DIAGNOSIS — R2689 Other abnormalities of gait and mobility: Secondary | ICD-10-CM

## 2020-06-09 DIAGNOSIS — M6281 Muscle weakness (generalized): Secondary | ICD-10-CM | POA: Diagnosis not present

## 2020-06-09 NOTE — Therapy (Signed)
[place] MAIN [facility] SERVICES [address] [place], [place], [address] Phone: [phone]   Fax:  [phone]  Physical Therapy Treatment  Patient Details  Name: Jessica Hardin MRN: [id number] Date of Birth: 01/09/1941 Referring Provider (PT): Gurney Maxin   Encounter Date: 06/09/2020   PT End of Session - 06/09/20 1057    Visit Number 11    Number of Visits 17    Date for PT Re-Evaluation 06/15/20    PT Start Time 1057    PT Stop Time 1138    PT Time Calculation (min) 41 min    Equipment Utilized During Treatment Gait belt    Activity Tolerance Patient tolerated treatment well;No increased pain    Behavior During Therapy WFL for tasks assessed/performed           Past Medical History:  Diagnosis Date  . Allergy   . Anxiety   . Asthma   . GERD (gastroesophageal reflux disease)   . History of chicken pox   . IBS (irritable bowel syndrome)   . Migraine   . Urinary tract infection     Past Surgical History:  Procedure Laterality Date  . ABDOMINAL HYSTERECTOMY    . CATARACT EXTRACTION, BILATERAL    . TUBAL LIGATION      There were no vitals filed for this visit.   Subjective Assessment - 06/09/20 1151    Subjective Patient reports she is coming along still wants to work on her balance.    Pertinent History Pt reports that she is having trouble with her balance. She states that it started approximately one year ago without any known trigger and has been progressive since that time. She has the most difficulty with head turns while walking. She denies dizziness or vertigo..  She reports recent problems with her memory    Currently in Pain? No/denies    Pain Score 0-No pain              [facility] PT Assessment - 06/09/20 0001      Standardized Balance Assessment   Standardized Balance Assessment Berg Balance Test      Berg Balance Test   Sit to Stand Able to stand without using hands and stabilize independently     Standing Unsupported Able to stand safely 2 minutes    Sitting with Back Unsupported but Feet Supported on Floor or Stool Able to sit safely and securely 2 minutes    Stand to Sit Sits safely with minimal use of hands    Transfers Able to transfer safely, minor use of hands    Standing Unsupported with Eyes Closed Able to stand 10 seconds safely    Standing Unsupported with Feet Together Able to place feet together independently and stand 1 minute safely    From Standing, Reach Forward with Outstretched Arm Can reach forward >12 cm safely (5")    From Standing Position, Pick up Object from Floor Able to pick up shoe safely and easily    From Standing Position, Turn to Look Behind Over each Shoulder Looks behind from both sides and weight shifts well    Turn 360 Degrees Able to turn 360 degrees safely in 4 seconds or less    Standing Unsupported, Alternately Place Feet on Step/Stool Able to stand independently and safely and complete 8 steps in 20 seconds    Standing Unsupported, One Foot in Front Able to plae foot ahead of the other independently and hold 30 seconds  Standing on One Leg Able to lift leg independently and hold > 10 seconds    Total Score 54               Neuro Re-ed: supervision to min-CGA and cues for safety awareness and body mechanics    Airex balance beam tandem walk f/b x5 laps Airex balance beam side stepping 3 laps EO, 2 laps EC Airex tandem balance 30"x2 Airex NBOS static stance EC 30"x2 Airex NBOS basketball/darts Airex SLS 10-15 seconds multiple reps each side  performed TUG, sit to stand, 10MWT, BERG                   PT Education - 06/09/20 1151    Education Details progress towards goals    Person(s) Educated Patient    Methods Explanation    Comprehension Verbalized understanding            PT Short Term Goals - 06/07/20 1208      PT SHORT TERM GOAL #1   Title Pt will be independent with HEP in order to improve strength and  balance in order to decrease fall risk and improve function at home and work.    Baseline 06/07/20  pt performing activities at home to help with balance, purchases cones to tap    Time 4    Period Weeks    Status Partially Met    Target Date 05/18/20             PT Long Term Goals - 06/09/20 1109      PT LONG TERM GOAL #1   Title Pt will improve BERG by at least 3 points in order to demonstrate clinically significant improvement in balance.      Baseline 06/07/20 will assess next visit, 06/09/20  [name] 54/56, was 55/56 on IPT    Time 8    Period Weeks    Status Achieved      PT LONG TERM GOAL #2   Title Patient will increase 10 meter walk test to >1.48ms as to improve gait speed for better community ambulation and to reduce fall risk.    Baseline .98 m/sec, 06/09/20  1.25 m/s    Time 8    Period Weeks    Status Achieved      PT LONG TERM GOAL #3   Title Patient will reduce timed up and go to <11 seconds to reduce fall risk and demonstrate improved transfer/gait ability.    Baseline 17.54 sec, 06/09/20  8.11    Time 8    Period Weeks    Status Achieved      PT LONG TERM GOAL #4   Title Patient (> 526years old) will complete five times sit to stand test in < 15 seconds indicating an increased LE strength and improved balance.    Baseline 20.51 sec, 06/09/20 10.53 seconds    Time 8    Period Weeks    Status Achieved                 Plan - 06/09/20 1146    Clinical Impression Statement Patient demonstrates excellent improvement overall towards goals including reduced fall risk as she improved gait speed, Berg Balance scale and ability to sit to stand.  The patient challenged with balance on uneven surfaces however was able to perform SLS on Airex for 10-15 seconds each side.  Patient continues to benefit from additional skilled PT services to improve balance for improved quality of life.  Stability/Clinical Decision Making Stable/Uncomplicated    Rehab Potential  Good    PT Frequency 2x / week    PT Duration 8 weeks    PT Treatment/Interventions ADLs/Self Care Home Management;Aquatic Therapy;Biofeedback;Canalith Repostioning;Cryotherapy;Iontophoresis 65m/ml Dexamethasone;Electrical Stimulation;Moist Heat;Traction;Ultrasound;DME Instruction;Gait training;Stair training;Functional mobility training;Therapeutic activities;Therapeutic exercise;Balance training;Neuromuscular re-education;Cognitive remediation;Patient/family education;Manual techniques;Dry needling;Energy conservation;Vestibular    PT Next Visit Plan Continue with balance training    PT Home Exercise Plan Semitandem balance with horiz head turns, full tandem static balance    Consulted and Agree with Plan of Care Patient           Patient will benefit from skilled therapeutic intervention in order to improve the following deficits and impairments:  Decreased balance,Difficulty walking,Decreased strength,Pain  Visit Diagnosis: Muscle weakness (generalized)  Other abnormalities of gait and mobility  Difficulty in walking, not elsewhere classified     Problem List Patient Active Problem List   Diagnosis Date Noted  . Pain of right breast 04/13/2020  . Acute pain of both shoulders 01/23/2020  . Essential hypertension 05/14/2019  . Medication side effect, initial encounter 03/20/2019  . Arthralgia 01/21/2019  . Breast mass, right 04/29/2018  . Generalized abdominal pain 02/19/2018  . GAD (generalized anxiety disorder) 10/30/2017  . MDD (major depressive disorder), single episode, mild ([place] 10/30/2017  . Chronic insomnia 10/30/2017  . Post-nasal drip 05/04/2017  . Tinnitus of right ear 05/04/2017  . Allergic rhinitis 10/05/2016  . Epidermoid cyst of finger of left hand 07/27/2016  . Osteopenia 11/18/2015  . Chronic constipation 07/20/2015  . Internal hemorrhoid, bleeding 07/20/2015  . 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 Counseling regarding end of life decision making 09/08/2014   . Childhood asthma 08/13/2014  . Gastroesophageal reflux disease without esophagitis 08/13/2014  . Irritable bowel syndrome with constipation 08/13/2014  . Other seasonal allergic rhinitis 08/13/2014  . Hx of migraines 08/13/2014  . Family history of colon cancer 08/13/2014  . Family history of uterine cancer 08/13/2014  . Family history of breast cancer 08/13/2014    BHal MoralesPT, DPT 06/09/2020, 11:53 AM  [place] [facility] [address] [address]Phone: [phone]  Fax:  [phone] Name: Jessica BRUNKHORSTMRN: [id number] of Birth: 310/05/1941

## 2020-06-14 ENCOUNTER — Ambulatory Visit: Payer: PPO

## 2020-06-14 NOTE — Telephone Encounter (Signed)
Called and left message for [facility] department for cognitive testing to see if patient has been scheduled. Called to follow up with [facility] and found out that they do not take referrals that are outside of [facility]. I called to follow up with [facility] on the appointment but they are closed until 12/28

## 2020-06-16 ENCOUNTER — Ambulatory Visit: Payer: PPO

## 2020-06-16 NOTE — Telephone Encounter (Signed)
Wonderful. 

## 2020-06-16 NOTE — Telephone Encounter (Signed)
Called and was able to speak with [name] and [name] department for congnitive impairtment. They both have the referral I sent and is still been processed. They will contact patient directly to schedule. I called and left message for patient's daughter, Jessica Hardin, giving her an update on this and if she had any questions or did not hear from them in another week or so to let me know. FYI TO Dr Ermalene Searing. Dr Maryruth Bun is aware that we are trying to get patient in for the testing she requested.

## 2020-06-21 ENCOUNTER — Other Ambulatory Visit: Payer: Self-pay

## 2020-06-21 ENCOUNTER — Ambulatory Visit: Payer: PPO | Attending: Neurology

## 2020-06-21 DIAGNOSIS — R2689 Other abnormalities of gait and mobility: Secondary | ICD-10-CM | POA: Insufficient documentation

## 2020-06-21 DIAGNOSIS — M6281 Muscle weakness (generalized): Secondary | ICD-10-CM | POA: Diagnosis not present

## 2020-06-21 DIAGNOSIS — H40003 Preglaucoma, unspecified, bilateral: Secondary | ICD-10-CM | POA: Diagnosis not present

## 2020-06-21 DIAGNOSIS — R262 Difficulty in walking, not elsewhere classified: Secondary | ICD-10-CM | POA: Diagnosis not present

## 2020-06-21 NOTE — Therapy (Signed)
[place] MAIN [facility] SERVICES [address] [place], [place], [address] Phone: [phone]   Fax:  [phone]  Physical Therapy Treatment  Patient Details  Name: Jessica Hardin MRN: [id number] Date of Birth: 08-Apr-1941 Referring Provider (PT): Jessica Hardin   Encounter Date: 06/21/2020   PT End of Session - 06/21/20 1103    Visit Number 12    Number of Visits 17    Date for PT Re-Evaluation 06/15/20    PT Start Time 2683    PT Stop Time 1056    PT Time Calculation (min) 41 min    Equipment Utilized During Treatment Gait belt    Activity Tolerance Patient tolerated treatment well;No increased pain    Behavior During Therapy WFL for tasks assessed/performed           Past Medical History:  Diagnosis Date  . Allergy   . Anxiety   . Asthma   . GERD (gastroesophageal reflux disease)   . History of chicken pox   . IBS (irritable bowel syndrome)   . Migraine   . Urinary tract infection     Past Surgical History:  Procedure Laterality Date  . ABDOMINAL HYSTERECTOMY    . CATARACT EXTRACTION, BILATERAL    . TUBAL LIGATION      There were no vitals filed for this visit.   Subjective Assessment - 06/21/20 1101    Subjective The patient has not been seen in PT due to being out of town.  The patient denies LOB or falls.    Pertinent History Pt reports that she is having trouble with her balance. She states that it started approximately one year ago without any known trigger and has been progressive since that time. She has the most difficulty with head turns while walking. She denies dizziness or vertigo..  She reports recent problems with her memory    Currently in Pain? No/denies    Pain Score 0-No pain           Airex balance beam tandem walk f/b x5 laps Airex balance beam side stepping 5 laps  Airex SLS 30"x2 each Airex basketball throw DL x2 and SL x2  Kore Balance maze and trails  Fwd/Retro walk around  cones Sidestepping over cones cueing for high knees and large steps Sit to stand to airex x10 EO/x10 EC                   PT Education - 06/21/20 1102    Education Details balance, exercise technique    Person(s) Educated Patient    Methods Explanation    Comprehension Verbalized understanding            PT Short Term Goals - 06/07/20 1208      PT SHORT TERM GOAL #1   Title Pt will be independent with HEP in order to improve strength and balance in order to decrease fall risk and improve function at home and work.    Baseline 06/07/20  pt performing activities at home to help with balance, purchases cones to tap    Time 4    Period Weeks    Status Partially Met    Target Date 05/18/20             PT Long Term Goals - 06/09/20 1109      PT LONG TERM GOAL #1   Title Pt will improve BERG by at least 3 points in order to demonstrate clinically significant improvement in  balance.      Baseline 06/07/20 will assess next visit, 06/09/20  Jessica Hardin 54/56, was 55/56 on IPT    Time 8    Period Weeks    Status Achieved      PT LONG TERM GOAL #2   Title Patient will increase 10 meter walk test to >1.44ms as to improve gait speed for better community ambulation and to reduce fall risk.    Baseline .98 m/sec, 06/09/20  1.25 m/s    Time 8    Period Weeks    Status Achieved      PT LONG TERM GOAL #3   Title Patient will reduce timed up and go to <11 seconds to reduce fall risk and demonstrate improved transfer/gait ability.    Baseline 17.54 sec, 06/09/20  8.11    Time 8    Period Weeks    Status Achieved      PT LONG TERM GOAL #4   Title Patient (> 586years old) will complete five times sit to stand test in < 15 seconds indicating an increased LE strength and improved balance.    Baseline 20.51 sec, 06/09/20 10.53 seconds    Time 8    Period Weeks    Status Achieved                 Plan - 06/21/20 1104    Clinical Impression Statement Patient was able  to perform high level balance activities this visit with good ability to maintain balance on uneven surfaces.  Patient exhibited 3 episodes of LOB with activities and was able to self recover. Min need for CGA, mainly supervision for activities.  The patient continues to benefit from additional skilled PT intervention to further improve balance for improved quality of life.    Stability/Clinical Decision Making Stable/Uncomplicated    Rehab Potential Good    PT Frequency 2x / week    PT Duration 8 weeks    PT Treatment/Interventions ADLs/Self Care Home Management;Aquatic Therapy;Biofeedback;Canalith Repostioning;Cryotherapy;Iontophoresis 451mml Dexamethasone;Electrical Stimulation;Moist Heat;Traction;Ultrasound;DME Instruction;Gait training;Stair training;Functional mobility training;Therapeutic activities;Therapeutic exercise;Balance training;Neuromuscular re-education;Cognitive remediation;Patient/family education;Manual techniques;Dry needling;Energy conservation;Vestibular    PT Next Visit Plan Continue with balance training    PT Home Exercise Plan Semitandem balance with horiz head turns, full tandem static balance    Consulted and Agree with Plan of Care Patient           Patient will benefit from skilled therapeutic intervention in order to improve the following deficits and impairments:  Decreased balance,Difficulty walking,Decreased strength,Pain  Visit Diagnosis: Muscle weakness (generalized)  Other abnormalities of gait and mobility  Difficulty in walking, not elsewhere classified     Problem List Patient Active Problem List   Diagnosis Date Noted  . Pain of right breast 04/13/2020  . Acute pain of both shoulders 01/23/2020  . Essential hypertension 05/14/2019  . Medication side effect, initial encounter 03/20/2019  . Arthralgia 01/21/2019  . Breast mass, right 04/29/2018  . Generalized abdominal pain 02/19/2018  . GAD (generalized anxiety disorder) 10/30/2017  . MDD  (major depressive disorder), single episode, mild ([place]/14/2019  . Chronic insomnia 10/30/2017  . Post-nasal drip 05/04/2017  . Tinnitus of right ear 05/04/2017  . Allergic rhinitis 10/05/2016  . Epidermoid cyst of finger of left hand 07/27/2016  . Osteopenia 11/18/2015  . Chronic constipation 07/20/2015  . Internal hemorrhoid, bleeding 07/20/2015  . 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 Counseling regarding end of life decision making 09/08/2014  . Childhood asthma 08/13/2014  . Gastroesophageal reflux disease  without esophagitis 08/13/2014  . Irritable bowel syndrome with constipation 08/13/2014  . Other seasonal allergic rhinitis 08/13/2014  . Hx of migraines 08/13/2014  . Family history of colon cancer 08/13/2014  . Family history of uterine cancer 08/13/2014  . Family history of breast cancer 08/13/2014    Hal Morales PT, DPT 06/21/2020, 11:11 AM  [place] MAIN [facility] SERVICES [place], [place], [address] Phone: [phone]   Fax:  [phone]  Name: Jessica Hardin MRN: [id number] Date of Birth: Jul 13, 1940

## 2020-06-23 ENCOUNTER — Other Ambulatory Visit: Payer: Self-pay

## 2020-06-23 ENCOUNTER — Ambulatory Visit: Payer: PPO

## 2020-06-23 DIAGNOSIS — M6281 Muscle weakness (generalized): Secondary | ICD-10-CM | POA: Diagnosis not present

## 2020-06-23 DIAGNOSIS — R2689 Other abnormalities of gait and mobility: Secondary | ICD-10-CM

## 2020-06-23 DIAGNOSIS — R262 Difficulty in walking, not elsewhere classified: Secondary | ICD-10-CM

## 2020-06-23 NOTE — Therapy (Signed)
[place] MAIN [facility] SERVICES [address] [place], [place], [address] Phone: [phone]   Fax:  [phone]  Physical Therapy Treatment  Patient Details  Name: Jessica Hardin MRN: [id number] Date of Birth: 1941-04-27 Referring Provider (PT): Gurney Maxin   Encounter Date: 06/23/2020   PT End of Session - 06/23/20 1118    Visit Number 13    Number of Visits 17    Date for PT Re-Evaluation 06/15/20    PT Start Time 1020    PT Stop Time 1058    PT Time Calculation (min) 38 min    Equipment Utilized During Treatment Gait belt    Activity Tolerance Patient tolerated treatment well;No increased pain    Behavior During Therapy WFL for tasks assessed/performed           Past Medical History:  Diagnosis Date  . Allergy   . Anxiety   . Asthma   . GERD (gastroesophageal reflux disease)   . History of chicken pox   . IBS (irritable bowel syndrome)   . Migraine   . Urinary tract infection     Past Surgical History:  Procedure Laterality Date  . ABDOMINAL HYSTERECTOMY    . CATARACT EXTRACTION, BILATERAL    . TUBAL LIGATION      There were no vitals filed for this visit.   Subjective Assessment - 06/23/20 1115    Subjective Patient reports that she is doing well no complaints.    Pertinent History Pt reports that she is having trouble with her balance. She states that it started approximately one year ago without any known trigger and has been progressive since that time. She has the most difficulty with head turns while walking. She denies dizziness or vertigo..  She reports recent problems with her memory    Currently in Pain? No/denies    Pain Score 0-No pain          Airex balance beam tandem walk f/b x5 laps Airex balance beam side stepping 5 laps  Airex SLS 30"x2 each Rocker Board static balance f/b and s/s basketball throw    Kore Balance scale and trails    Sit to stand to airex x10 EO/x10 EC Bosu step ups fwd  x20  and lateral x10                           PT Education - 06/23/20 1115    Education Details posture, balance    Person(s) Educated Patient    Methods Explanation    Comprehension Verbalized understanding            PT Short Term Goals - 06/07/20 1208      PT SHORT TERM GOAL #1   Title Pt will be independent with HEP in order to improve strength and balance in order to decrease fall risk and improve function at home and work.    Baseline 06/07/20  pt performing activities at home to help with balance, purchases cones to tap    Time 4    Period Weeks    Status Partially Met    Target Date 05/18/20             PT Long Term Goals - 06/09/20 1109      PT LONG TERM GOAL #1   Title Pt will improve BERG by at least 3 points in order to demonstrate clinically significant improvement in balance.  Baseline 06/07/20 will assess next visit, 06/09/20  [name] 54/56, was 55/56 on IPT    Time 8    Period Weeks    Status Achieved      PT LONG TERM GOAL #2   Title Patient will increase 10 meter walk test to >1.47m/s as to improve gait speed for better community ambulation and to reduce fall risk.    Baseline .98 m/sec, 06/09/20  1.25 m/s    Time 8    Period Weeks    Status Achieved      PT LONG TERM GOAL #3   Title Patient will reduce timed up and go to <11 seconds to reduce fall risk and demonstrate improved transfer/gait ability.    Baseline 17.54 sec, 06/09/20  8.11    Time 8    Period Weeks    Status Achieved      PT LONG TERM GOAL #4   Title Patient (> 81 years old) will complete five times sit to stand test in < 15 seconds indicating an increased LE strength and improved balance.    Baseline 20.51 sec, 06/09/20 10.53 seconds    Time 8    Period Weeks    Status Achieved                 Plan - 06/23/20 1119    Clinical Impression Statement Patient with good ability to perform advanced balance activities in the clinic.  Patient able to  independently adjust COG when she begins to lose balance.  The patient continues to benefit from addition skilled PT services to improve LE strength and balance for return to prior level.    Stability/Clinical Decision Making Stable/Uncomplicated    Rehab Potential Good    PT Frequency 2x / week    PT Duration 8 weeks    PT Treatment/Interventions ADLs/Self Care Home Management;Aquatic Therapy;Biofeedback;Canalith Repostioning;Cryotherapy;Iontophoresis 4mg /ml Dexamethasone;Electrical Stimulation;Moist Heat;Traction;Ultrasound;DME Instruction;Gait training;Stair training;Functional mobility training;Therapeutic activities;Therapeutic exercise;Balance training;Neuromuscular re-education;Cognitive remediation;Patient/family education;Manual techniques;Dry needling;Energy conservation;Vestibular    PT Next Visit Plan Continue with balance training    PT Home Exercise Plan Semitandem balance with horiz head turns, full tandem static balance    Consulted and Agree with Plan of Care Patient           Patient will benefit from skilled therapeutic intervention in order to improve the following deficits and impairments:  Decreased balance,Difficulty walking,Decreased strength,Pain  Visit Diagnosis: Muscle weakness (generalized)  Other abnormalities of gait and mobility  Difficulty in walking, not elsewhere classified     Problem List Patient Active Problem List   Diagnosis Date Noted  . Pain of right breast 04/13/2020  . Acute pain of both shoulders 01/23/2020  . Essential hypertension 05/14/2019  . Medication side effect, initial encounter 03/20/2019  . Arthralgia 01/21/2019  . Breast mass, right 04/29/2018  . Generalized abdominal pain 02/19/2018  . GAD (generalized anxiety disorder) 10/30/2017  . MDD (major depressive disorder), single episode, mild ([place]) 10/30/2017  . Chronic insomnia 10/30/2017  . Post-nasal drip 05/04/2017  . Tinnitus of right ear 05/04/2017  . Allergic rhinitis  10/05/2016  . Epidermoid cyst of finger of left hand 07/27/2016  . Osteopenia 11/18/2015  . Chronic constipation 07/20/2015  . Internal hemorrhoid, bleeding 07/20/2015  . 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 Counseling regarding end of life decision making 09/08/2014  . Childhood asthma 08/13/2014  . Gastroesophageal reflux disease without esophagitis 08/13/2014  . Irritable bowel syndrome with constipation 08/13/2014  . Other seasonal allergic rhinitis 08/13/2014  . Hx of  migraines 08/13/2014  . Family history of colon cancer 08/13/2014  . Family history of uterine cancer 08/13/2014  . Family history of breast cancer 08/13/2014    Hal Morales PT, DPT 06/23/2020, 11:37 AM  [place] MAIN [facility] SERVICES [address] [place], [place], [address] Phone: [phone]   Fax:  [phone]  Name: Jessica Hardin MRN: [id number] Date of Birth: Mar 21, 1941

## 2020-06-24 DIAGNOSIS — F411 Generalized anxiety disorder: Secondary | ICD-10-CM | POA: Diagnosis not present

## 2020-06-24 DIAGNOSIS — F5105 Insomnia due to other mental disorder: Secondary | ICD-10-CM | POA: Diagnosis not present

## 2020-06-24 DIAGNOSIS — F322 Major depressive disorder, single episode, severe without psychotic features: Secondary | ICD-10-CM | POA: Diagnosis not present

## 2020-06-28 ENCOUNTER — Telehealth: Payer: Self-pay | Admitting: *Deleted

## 2020-06-28 ENCOUNTER — Other Ambulatory Visit: Payer: Self-pay

## 2020-06-28 ENCOUNTER — Ambulatory Visit: Payer: PPO

## 2020-06-28 DIAGNOSIS — H40003 Preglaucoma, unspecified, bilateral: Secondary | ICD-10-CM | POA: Diagnosis not present

## 2020-06-28 DIAGNOSIS — R2689 Other abnormalities of gait and mobility: Secondary | ICD-10-CM

## 2020-06-28 DIAGNOSIS — M6281 Muscle weakness (generalized): Secondary | ICD-10-CM

## 2020-06-28 DIAGNOSIS — R262 Difficulty in walking, not elsewhere classified: Secondary | ICD-10-CM

## 2020-06-28 NOTE — Telephone Encounter (Signed)
Patient left a voicemail stating that she was advised to follow-up with Dr. Diona Browner to be seen. Tried to call patient back and got her voicemail. Advised patient to call back and schedule an appointment and to be asked to be transferred to triage if she needed anything else.

## 2020-06-28 NOTE — Therapy (Signed)
[place] MAIN [facility] SERVICES [address] [place], [place], [address] Phone: [phone]   Fax:  [phone]  Physical Therapy Treatment  Patient Details  Name: Jessica Hardin MRN: [id number] Date of Birth: 20-Apr-1941 Referring Provider (PT): Gurney Maxin   Encounter Date: 06/28/2020   PT End of Session - 06/28/20 1201    Visit Number 14    Number of Visits 17    Date for PT Re-Evaluation 06/15/20    PT Start Time 1016    PT Stop Time 1055    PT Time Calculation (min) 39 min    Equipment Utilized During Treatment Gait belt    Activity Tolerance Patient tolerated treatment well;No increased pain    Behavior During Therapy WFL for tasks assessed/performed           Past Medical History:  Diagnosis Date  . Allergy   . Anxiety   . Asthma   . GERD (gastroesophageal reflux disease)   . History of chicken pox   . IBS (irritable bowel syndrome)   . Migraine   . Urinary tract infection     Past Surgical History:  Procedure Laterality Date  . ABDOMINAL HYSTERECTOMY    . CATARACT EXTRACTION, BILATERAL    . TUBAL LIGATION      There were no vitals filed for this visit.   Subjective Assessment - 06/28/20 1157    Subjective The patient reports she continues to be doing well.    Pertinent History Pt reports that she is having trouble with her balance. She states that it started approximately one year ago without any known trigger and has been progressive since that time. She has the most difficulty with head turns while walking. She denies dizziness or vertigo..  She reports recent problems with her memory    Currently in Pain? No/denies    Pain Score 0-No pain          Airex balance beam tandem walk f/b x5 laps Airex balance beam side stepping 5 laps  Airex SLS 30"x2 each     Kore Balance scale tux racer     Sit to stand to airex x10 EO/x10 EC Bosu step ups fwd x20  and lateral x10 3# step over 2 hurdles f/b and side  stepping x10 each                            PT Education - 06/28/20 1157    Education Details posture, balance    Person(s) Educated Patient    Methods Explanation    Comprehension Verbalized understanding            PT Short Term Goals - 06/07/20 1208      PT SHORT TERM GOAL #1   Title Pt will be independent with HEP in order to improve strength and balance in order to decrease fall risk and improve function at home and work.    Baseline 06/07/20  pt performing activities at home to help with balance, purchases cones to tap    Time 4    Period Weeks    Status Partially Met    Target Date 05/18/20             PT Long Term Goals - 06/09/20 1109      PT LONG TERM GOAL #1   Title Pt will improve BERG by at least 3 points in order to demonstrate clinically significant improvement in balance.  Baseline 06/07/20 will assess next visit, 06/09/20  [name] 54/56, was 81/56 on IPT    Time 8    Period Weeks    Status Achieved      PT LONG TERM GOAL #2   Title Patient will increase 10 meter walk test to >1.77m/s as to improve gait speed for better community ambulation and to reduce fall risk.    Baseline .98 m/sec, 06/09/20  1.25 m/s    Time 8    Period Weeks    Status Achieved      PT LONG TERM GOAL #3   Title Patient will reduce timed up and go to <11 seconds to reduce fall risk and demonstrate improved transfer/gait ability.    Baseline 17.54 sec, 06/09/20  8.11    Time 8    Period Weeks    Status Achieved      PT LONG TERM GOAL #4   Title Patient (> 81 years old) will complete five times sit to stand test in < 15 seconds indicating an increased LE strength and improved balance.    Baseline 20.51 sec, 06/09/20 10.53 seconds    Time 8    Period Weeks    Status Achieved                 Plan - 06/28/20 1201    Clinical Impression Statement Patient able to perform exercises with close supervision.  The patient demonstrates a good ability  to self correct balance when she gets unsteady on uneven surfaces.  Cueing required for slowing exercise and balance.  The patient continues to benefit from additional skilled PT services to further improve balance and LE strength.    Stability/Clinical Decision Making Stable/Uncomplicated    Rehab Potential Good    PT Frequency 2x / week    PT Duration 8 weeks    PT Treatment/Interventions ADLs/Self Care Home Management;Aquatic Therapy;Biofeedback;Canalith Repostioning;Cryotherapy;Iontophoresis 4mg /ml Dexamethasone;Electrical Stimulation;Moist Heat;Traction;Ultrasound;DME Instruction;Gait training;Stair training;Functional mobility training;Therapeutic activities;Therapeutic exercise;Balance training;Neuromuscular re-education;Cognitive remediation;Patient/family education;Manual techniques;Dry needling;Energy conservation;Vestibular    PT Next Visit Plan Continue with balance training    PT Home Exercise Plan Semitandem balance with horiz head turns, full tandem static balance    Consulted and Agree with Plan of Care Patient           Patient will benefit from skilled therapeutic intervention in order to improve the following deficits and impairments:  Decreased balance,Difficulty walking,Decreased strength,Pain  Visit Diagnosis: Muscle weakness (generalized)  Other abnormalities of gait and mobility  Difficulty in walking, not elsewhere classified     Problem List Patient Active Problem List   Diagnosis Date Noted  . Pain of right breast 04/13/2020  . Acute pain of both shoulders 01/23/2020  . Essential hypertension 05/14/2019  . Medication side effect, initial encounter 03/20/2019  . Arthralgia 01/21/2019  . Breast mass, right 04/29/2018  . Generalized abdominal pain 02/19/2018  . GAD (generalized anxiety disorder) 10/30/2017  . MDD (major depressive disorder), single episode, mild ([place]) 10/30/2017  . Chronic insomnia 10/30/2017  . Post-nasal drip 05/04/2017  . Tinnitus of  right ear 05/04/2017  . Allergic rhinitis 10/05/2016  . Epidermoid cyst of finger of left hand 07/27/2016  . Osteopenia 11/18/2015  . Chronic constipation 07/20/2015  . Internal hemorrhoid, bleeding 07/20/2015  . 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 Counseling regarding end of life decision making 09/08/2014  . Childhood asthma 08/13/2014  . Gastroesophageal reflux disease without esophagitis 08/13/2014  . Irritable bowel syndrome with constipation 08/13/2014  . Other seasonal allergic rhinitis  08/13/2014  . Hx of migraines 08/13/2014  . Family history of colon cancer 08/13/2014  . Family history of uterine cancer 08/13/2014  . Family history of breast cancer 08/13/2014    Hal Morales PT, DPT 06/28/2020, 12:04 PM  [place] MAIN [facility] SERVICES [address] [place], [place], [address] Phone: [phone]   Fax:  [phone]  Name: Jessica Hardin MRN: [id number] Date of Birth: 1940-10-05

## 2020-06-30 ENCOUNTER — Other Ambulatory Visit: Payer: Self-pay

## 2020-06-30 ENCOUNTER — Ambulatory Visit: Payer: PPO

## 2020-06-30 DIAGNOSIS — M6281 Muscle weakness (generalized): Secondary | ICD-10-CM | POA: Diagnosis not present

## 2020-06-30 DIAGNOSIS — R2689 Other abnormalities of gait and mobility: Secondary | ICD-10-CM

## 2020-06-30 DIAGNOSIS — R262 Difficulty in walking, not elsewhere classified: Secondary | ICD-10-CM

## 2020-06-30 NOTE — Therapy (Signed)
[place] MAIN [facility] SERVICES [address] [place], [place], [address] Phone: [phone]   Fax:  [phone]  Physical Therapy Treatment  Patient Details  Name: Jessica Hardin MRN: [id number] Date of Birth: Jul 08, 1940 Referring Provider (PT): Jessica Hardin   Encounter Date: 06/30/2020   PT End of Session - 06/30/20 1018    Visit Number 15    Number of Visits 17    Date for PT Re-Evaluation 06/15/20    PT Start Time 1018    PT Stop Time 1058    PT Time Calculation (min) 40 min    Equipment Utilized During Treatment Gait belt    Activity Tolerance Patient tolerated treatment well;No increased pain    Behavior During Therapy WFL for tasks assessed/performed           Past Medical History:  Diagnosis Date  . Allergy   . Anxiety   . Asthma   . GERD (gastroesophageal reflux disease)   . History of chicken pox   . IBS (irritable bowel syndrome)   . Migraine   . Urinary tract infection     Past Surgical History:  Procedure Laterality Date  . ABDOMINAL HYSTERECTOMY    . CATARACT EXTRACTION, BILATERAL    . TUBAL LIGATION      There were no vitals filed for this visit.   Subjective Assessment - 06/30/20 1106    Subjective Patient reports she is thankfully doing well.    Pertinent History Pt reports that she is having trouble with her balance. She states that it started approximately one year ago without any known trigger and has been progressive since that time. She has the most difficulty with head turns while walking. She denies dizziness or vertigo..  She reports recent problems with her memory    Currently in Pain? No/denies    Pain Score 0-No pain          NEUROMUSCULAR RE-EDUCATION  Bosu step ups f/l x10 each intermittent UE support required Rockerboard balance with dart throwing x2 Sit to stands to airex pad with yellow ball self toss x20 Hurdle step overs- 2 hurdles set up  f/l x10 each with 2.5# on patient's  ankles Mini obstacle course consisting of 6 inch step, hurdles and airex pad f/l x10 no UE support required Airex SLS 30"x2 each side Yellow and green discs shifting full weight to each side x5 each  Kore Balance Tux racer bronze race Counsellor Pop Dot                            PT Short Term Goals - 06/07/20 1208      PT SHORT TERM GOAL #1   Title Pt will be independent with HEP in order to improve strength and balance in order to decrease fall risk and improve function at home and work.    Baseline 06/07/20  pt performing activities at home to help with balance, purchases cones to tap    Time 4    Period Weeks    Status Partially Met    Target Date 05/18/20             PT Long Term Goals - 06/09/20 1109      PT LONG TERM GOAL #1   Title Pt will improve BERG by at least 3 points in order to demonstrate clinically significant improvement in balance.      Baseline 06/07/20 will assess next  visit, 06/09/20  Jessica Hardin 54/56, was 55/56 on IPT    Time 8    Period Weeks    Status Achieved      PT LONG TERM GOAL #2   Title Patient will increase 10 meter walk test to >1.31ms as to improve gait speed for better community ambulation and to reduce fall risk.    Baseline .98 m/sec, 06/09/20  1.25 m/s    Time 8    Period Weeks    Status Achieved      PT LONG TERM GOAL #3   Title Patient will reduce timed up and go to <11 seconds to reduce fall risk and demonstrate improved transfer/gait ability.    Baseline 17.54 sec, 06/09/20  8.11    Time 8    Period Weeks    Status Achieved      PT LONG TERM GOAL #4   Title Patient (> 89years old) will complete five times sit to stand test in < 15 seconds indicating an increased LE strength and improved balance.    Baseline 20.51 sec, 06/09/20 10.53 seconds    Time 8    Period Weeks    Status Achieved                 Plan - 06/30/20 1104    Clinical Impression Statement Patient performing advanced balance  exercises with good ability to maintain stability when reaching out of her COG on uneven surfaces.  The patient continues to benefit from additional skilled PT services to further improve stability and LE strength for return to prior level.    Stability/Clinical Decision Making Stable/Uncomplicated    Rehab Potential Good    PT Frequency 2x / week    PT Duration 8 weeks    PT Treatment/Interventions ADLs/Self Care Home Management;Aquatic Therapy;Biofeedback;Canalith Repostioning;Cryotherapy;Iontophoresis 418mml Dexamethasone;Electrical Stimulation;Moist Heat;Traction;Ultrasound;DME Instruction;Gait training;Stair training;Functional mobility training;Therapeutic activities;Therapeutic exercise;Balance training;Neuromuscular re-education;Cognitive remediation;Patient/family education;Manual techniques;Dry needling;Energy conservation;Vestibular    PT Next Visit Plan Continue with balance training    PT Home Exercise Plan Semitandem balance with horiz head turns, full tandem static balance    Consulted and Agree with Plan of Care Patient           Patient will benefit from skilled therapeutic intervention in order to improve the following deficits and impairments:  Decreased balance,Difficulty walking,Decreased strength,Pain  Visit Diagnosis: Muscle weakness (generalized)  Other abnormalities of gait and mobility  Difficulty in walking, not elsewhere classified     Problem List Patient Active Problem List   Diagnosis Date Noted  . Pain of right breast 04/13/2020  . Acute pain of both shoulders 01/23/2020  . Essential hypertension 05/14/2019  . Medication side effect, initial encounter 03/20/2019  . Arthralgia 01/21/2019  . Breast mass, right 04/29/2018  . Generalized abdominal pain 02/19/2018  . GAD (generalized anxiety disorder) 10/30/2017  . MDD (major depressive disorder), single episode, mild ([place]05/14/2019  . Chronic insomnia 10/30/2017  . Post-nasal drip 05/04/2017  .  Tinnitus of right ear 05/04/2017  . Allergic rhinitis 10/05/2016  . Epidermoid cyst of finger of left hand 07/27/2016  . Osteopenia 11/18/2015  . Chronic constipation 07/20/2015  . Internal hemorrhoid, bleeding 07/20/2015  . 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 Counseling regarding end of life decision making 09/08/2014  . Childhood asthma 08/13/2014  . Gastroesophageal reflux disease without esophagitis 08/13/2014  . Irritable bowel syndrome with constipation 08/13/2014  . Other seasonal allergic rhinitis 08/13/2014  . Hx of migraines 08/13/2014  . Family history of colon cancer  08/13/2014  . Family history of uterine cancer 08/13/2014  . Family history of breast cancer 08/13/2014    Hal Morales DPT, PT 06/30/2020, 11:13 AM  [place] MAIN [facility] SERVICES [address] [place], [place], [address] Phone: [phone]   Fax:  [phone]  Name: Jessica Hardin MRN: [id number] Date of Birth: February 26, 1941

## 2020-07-02 ENCOUNTER — Other Ambulatory Visit: Payer: Self-pay

## 2020-07-02 ENCOUNTER — Ambulatory Visit (INDEPENDENT_AMBULATORY_CARE_PROVIDER_SITE_OTHER): Payer: PPO | Admitting: Family Medicine

## 2020-07-02 VITALS — BP 142/70 | HR 77 | Temp 97.6°F | Ht 58.5 in | Wt 124.0 lb

## 2020-07-02 DIAGNOSIS — F320 Major depressive disorder, single episode, mild: Secondary | ICD-10-CM | POA: Diagnosis not present

## 2020-07-02 DIAGNOSIS — F411 Generalized anxiety disorder: Secondary | ICD-10-CM | POA: Diagnosis not present

## 2020-07-02 DIAGNOSIS — R413 Other amnesia: Secondary | ICD-10-CM | POA: Insufficient documentation

## 2020-07-02 DIAGNOSIS — J302 Other seasonal allergic rhinitis: Secondary | ICD-10-CM | POA: Diagnosis not present

## 2020-07-02 DIAGNOSIS — F5104 Psychophysiologic insomnia: Secondary | ICD-10-CM | POA: Diagnosis not present

## 2020-07-02 MED ORDER — TRAZODONE HCL 50 MG PO TABS: 25.0000 mg | ORAL_TABLET | Freq: Every evening | ORAL | 5 refills | Status: DC | PRN

## 2020-07-02 NOTE — Assessment & Plan Note (Signed)
May be secondary to mask... change to dye/fragrance free detergent.  Can restart flonase.

## 2020-07-02 NOTE — Assessment & Plan Note (Signed)
Stable, chronic.  Continue current medication.    

## 2020-07-02 NOTE — Assessment & Plan Note (Signed)
Inadequate control.   

## 2020-07-02 NOTE — Assessment & Plan Note (Signed)
Borderline control.. unsure what med she is on.  Awaiting appt for neuropsych testing to establish dementia vs pseudodementia.

## 2020-07-02 NOTE — Progress Notes (Signed)
Patient ID: Jessica Hardin, female    DOB: [birth year], 80 y.o.   MRN: [id number]  This visit was conducted in person.  BP (!) 142/70   Pulse 77   Temp 97.6 F (36.4 C) (Temporal)   Ht 4' 10.5" (1.486 m)   Wt 124 lb (56.2 kg)   SpO2 97%   BMI 25.47 kg/m    CC: memory and sleeping issues Subjective:   HPI: Jessica Hardin is a 80 y.o. female presenting on 07/02/2020 for Follow-up (Memory/sleeping issues )   She is seeing Dr. Nicolasa Ducking psychiatry for MDD, GAD and chronic insomnia Dr. Nicolasa Ducking has recommended neuropsych testing... per note from [facility] referral cocordinator on 12/14... we are awaiting [place] neuropsychiatry office and [place] to call pt to set up OV.. she reports daughter set this up.  She is using trazodone for sleep.. helping control insomnia. She does not remember her meds and will call back to review her med list.   She is exercising off and on   She is going to  [facility] rehab for gait and balance training muscle weakness    She has seen Dr. Melrose Nakayama, neurology on 02/12/2020  recommended follow up in 4-6 months. for imbalance.  She has noted runny nose only on left side.. not sure how long.  Seems to be more with mask.   Relevant past medical, surgical, family and social history reviewed and updated as indicated. Interim medical history since our last visit reviewed. Allergies and medications reviewed and updated. Outpatient Medications Prior to Visit  Medication Sig Dispense Refill  . Azelastine HCl 0.15 % SOLN Place 2 sprays into both nostrils daily.    Marland Kitchen gabapentin (NEURONTIN) 100 MG capsule TAKE ONE CAPSULE BY MOUTH TWICE DAILY FOR TWO WEEKS. THEN INCREASE TO TAKE TWO CAPSULES BY MOUTH TWICE DAILY AND CONTINUE    . hydrOXYzine (ATARAX/VISTARIL) 10 MG tablet TAKE 1 TABLET BY MOUTH THREE TIMES DAILY AS NEEDED FOR ANXIETY 30 tablet 5  . latanoprost (XALATAN) 0.005 % ophthalmic solution Place 1 drop into both eyes at bedtime.    . meloxicam (MOBIC) 7.5  MG tablet Take 1 tablet (7.5 mg total) by mouth daily as needed for pain. 30 tablet 0  . Multiple Vitamin (MULTIVITAMIN) capsule Take 1 capsule by mouth daily.     . Olopatadine HCl 0.6 % SOLN Place 1-2 sprays into the nose in the morning and at bedtime.    . traZODone (DESYREL) 50 MG tablet Take 0.5-1 tablets (25-50 mg total) by mouth at bedtime as needed for sleep. 30 tablet 3  . amLODipine (NORVASC) 2.5 MG tablet Take 1 tablet (2.5 mg total) by mouth daily. (Patient not taking: No sig reported) 30 tablet 3   No facility-administered medications prior to visit.     Per HPI unless specifically indicated in ROS section below Review of Systems  Constitutional: Negative for fatigue, fever and unexpected weight change.  HENT: Positive for rhinorrhea. Negative for congestion, ear pain, sinus pressure, sneezing, sore throat and trouble swallowing.   Eyes: Negative for pain and itching.  Respiratory: Negative for cough, shortness of breath and wheezing.   Cardiovascular: Negative for chest pain, palpitations and leg swelling.  Gastrointestinal: Negative for abdominal pain, blood in stool, constipation, diarrhea and nausea.  Genitourinary: Negative for difficulty urinating, dysuria, hematuria, menstrual problem and vaginal discharge.  Skin: Negative for rash.  Neurological: Negative for syncope, weakness, light-headedness, numbness and headaches.  Psychiatric/Behavioral: Negative for confusion and dysphoric mood.  The patient is not nervous/anxious.    Objective:  BP (!) 142/70   Pulse 77   Temp 97.6 F (36.4 C) (Temporal)   Ht 4' 10.5" (1.486 m)   Wt 124 lb (56.2 kg)   SpO2 97%   BMI 25.47 kg/m   Wt Readings from Last 3 Encounters:  07/02/20 124 lb (56.2 kg)  04/13/20 122 lb 4 oz (55.5 kg)  01/23/20 123 lb 4 oz (55.9 kg)      Physical Exam Constitutional:      General: She is not in acute distress.Vital signs are normal.     Appearance: Normal appearance. She is well-developed and  well-nourished. She is not ill-appearing or toxic-appearing.  HENT:     Head: Normocephalic.     Right Ear: Hearing, tympanic membrane, ear canal and external ear normal. Tympanic membrane is not erythematous, retracted or bulging.     Left Ear: Hearing, tympanic membrane, ear canal and external ear normal. Tympanic membrane is not erythematous, retracted or bulging.     Nose: No mucosal edema or rhinorrhea.     Right Sinus: No maxillary sinus tenderness or frontal sinus tenderness.     Left Sinus: No maxillary sinus tenderness or frontal sinus tenderness.     Mouth/Throat:     Mouth: Oropharynx is clear and moist and mucous membranes are normal.     Pharynx: Uvula midline.  Eyes:     General: Lids are normal. Lids are everted, no foreign bodies appreciated.     Extraocular Movements: EOM normal.     Conjunctiva/sclera: Conjunctivae normal.     Pupils: Pupils are equal, round, and reactive to light.  Neck:     Thyroid: No thyroid mass or thyromegaly.     Vascular: No carotid bruit.     Trachea: Trachea normal.  Cardiovascular:     Rate and Rhythm: Normal rate and regular rhythm.     Pulses: Normal pulses and intact distal pulses.     Heart sounds: Normal heart sounds, S1 normal and S2 normal. No murmur heard. No friction rub. No gallop.   Pulmonary:     Effort: Pulmonary effort is normal. No tachypnea or respiratory distress.     Breath sounds: Normal breath sounds. No decreased breath sounds, wheezing, rhonchi or rales.  Abdominal:     General: Bowel sounds are normal.     Palpations: Abdomen is soft.     Tenderness: There is no abdominal tenderness.  Musculoskeletal:     Cervical back: Normal range of motion and neck supple.  Skin:    General: Skin is warm, dry and intact.     Findings: No rash.  Neurological:     Mental Status: She is alert.  Psychiatric:        Mood and Affect: Mood is not anxious or depressed.        Speech: Speech normal.        Behavior: Behavior  normal. Behavior is cooperative.        Thought Content: Thought content normal.        Cognition and Memory: Cognition and memory normal.        Judgment: Judgment normal.       Results for orders placed or performed during the hospital encounter of 07/21/19  Novel Coronavirus, NAA ([facility])   Specimen: Nasopharyngeal(NP) swabs in vial transport medium   NASOPHARYNGE  Result Value Ref Range   SARS-CoV-2, NAA Not Detected Not Detected    This visit occurred during the  SARS-CoV-2 public health emergency.  Safety protocols were in place, including screening questions prior to the visit, additional usage of staff PPE, and extensive cleaning of exam room while observing appropriate contact time as indicated for disinfecting solutions.   COVID 19 screen:  No recent travel or known exposure to COVID19 The patient denies respiratory symptoms of COVID 19 at this time. The importance of social distancing was discussed today.   Assessment and Plan Problem List Items Addressed This Visit    Allergic rhinitis    May be secondary to mask... change to dye/fragrance free detergent.  Can restart flonase.      Chronic insomnia    Stable, chronic.  Continue current medication.         GAD (generalized anxiety disorder)    Inadequate control.      Relevant Medications   traZODone (DESYREL) 50 MG tablet   MDD (major depressive disorder), single episode, mild ([facility]) - Primary    Borderline control.. unsure what med she is on.  Awaiting appt for neuropsych testing to establish dementia vs pseudodementia.      Relevant Medications   traZODone (DESYREL) 50 MG tablet   Memory loss     Meds ordered this encounter  Medications  . traZODone (DESYREL) 50 MG tablet    Sig: Take 0.5-1 tablets (25-50 mg total) by mouth at bedtime as needed for sleep.    Dispense:  30 tablet    Refill:  5       Eliezer Lofts, MD

## 2020-07-02 NOTE — Patient Instructions (Addendum)
Let us know when the neuropsych testing.  Call to set up follow up as requested with Dr. Melrose Nakayama.. due 4-6 months after 02/12/2020 appt... about 08/14/2020.  Flonase 2 sprays per nostril as needed.

## 2020-07-05 ENCOUNTER — Ambulatory Visit: Payer: PPO

## 2020-07-06 ENCOUNTER — Telehealth: Payer: Self-pay

## 2020-07-06 NOTE — Telephone Encounter (Signed)
[place] Night - Client Nonclinical Telephone Record  [facility] Client [facility] Night - Client Client Site [place] Physician Eliezer Lofts - MD Contact Type Call Who Is Calling Patient / Member / Family / Caregiver Caller Name Hugo Phone Number [phone] Patient Name Jessica Hardin Patient DOB August 28, 1940 Call Type Message Only Information Provided Reason for Call Request for General Office Information Initial Comment Caller states she just left the facility and the office wanted to know the last date she took the 3rd shot. 06/05/2020 Additional Comment Provided office hours Disp. Time Disposition Final User 07/02/2020 5:10:10 PM General Information Provided Yes Chuck Hint Call Closed By: Chuck Hint Transaction Date/Time: 07/02/2020 5:07:08 PM (ET)   Documented vaccine in chart

## 2020-07-07 ENCOUNTER — Ambulatory Visit: Payer: PPO

## 2020-07-07 DIAGNOSIS — R2689 Other abnormalities of gait and mobility: Secondary | ICD-10-CM

## 2020-07-07 DIAGNOSIS — M6281 Muscle weakness (generalized): Secondary | ICD-10-CM | POA: Diagnosis not present

## 2020-07-07 DIAGNOSIS — R262 Difficulty in walking, not elsewhere classified: Secondary | ICD-10-CM

## 2020-07-07 NOTE — Therapy (Signed)
[place] MAIN [facility] SERVICES [address] [place], [place], [address] Phone: [phone]   Fax:  [phone]  Physical Therapy Treatment  Patient Details  Name: Jessica Hardin MRN: [id number] Date of Birth: 1940-08-27 Referring Provider (PT): Jessica Hardin   Encounter Date: 07/07/2020   PT End of Session - 07/07/20 1100    Visit Number 16    Number of Visits 17    Date for PT Re-Evaluation 06/15/20    PT Start Time 1018    PT Stop Time 1058    PT Time Calculation (min) 40 min    Equipment Utilized During Treatment Gait belt    Activity Tolerance Patient tolerated treatment well;No increased pain    Behavior During Therapy WFL for tasks assessed/performed           Past Medical History:  Diagnosis Date  . Allergy   . Anxiety   . Asthma   . GERD (gastroesophageal reflux disease)   . History of chicken pox   . IBS (irritable bowel syndrome)   . Migraine   . Urinary tract infection     Past Surgical History:  Procedure Laterality Date  . ABDOMINAL HYSTERECTOMY    . CATARACT EXTRACTION, BILATERAL    . TUBAL LIGATION      There were no vitals filed for this visit.   Subjective Assessment - 07/07/20 1059    Subjective The patient reports that she continues to be doing well.    Pertinent History Pt reports that she is having trouble with her balance. She states that it started approximately one year ago without any known trigger and has been progressive since that time. She has the most difficulty with head turns while walking. She denies dizziness or vertigo..  She reports recent problems with her memory    Currently in Pain? No/denies    Pain Score 0-No pain          NEUROMUSCULAR RE-EDUCATION   Bosu step ups f/l x10 each intermittent UE support required Rockerboard balance with dart throwing x2 Sit to stands to airex pad with yellow ball self toss x20 Hurdle step overs- 2 hurdles set up  f/l x10 each with 2.5# on  patient's ankles Airex Tandem Balance EC 30"x2 each Airex SLS 30"x2 each side    Kore Balance Tux racer bronze race Kore Balance Pop Dot Kor Balance Scale Patient able to perform without UE support, challenged with weight shifting.                              PT Education - 07/07/20 1100    Education Details balance    Person(s) Educated Patient    Methods Explanation    Comprehension Verbalized understanding            PT Short Term Goals - 06/07/20 1208      PT SHORT TERM GOAL #1   Title Pt will be independent with HEP in order to improve strength and balance in order to decrease fall risk and improve function at home and work.    Baseline 06/07/20  pt performing activities at home to help with balance, purchases cones to tap    Time 4    Period Weeks    Status Partially Met    Target Date 05/18/20             PT Long Term Goals - 06/09/20 1109  PT LONG TERM GOAL #1   Title Pt will improve BERG by at least 3 points in order to demonstrate clinically significant improvement in balance.      Baseline 06/07/20 will assess next visit, 06/09/20  Jessica Hardin 54/56, was 55/56 on IPT    Time 8    Period Weeks    Status Achieved      PT LONG TERM GOAL #2   Title Patient will increase 10 meter walk test to >1.37m/s as to improve gait speed for better community ambulation and to reduce fall risk.    Baseline .98 m/sec, 06/09/20  1.25 m/s    Time 8    Period Weeks    Status Achieved      PT LONG TERM GOAL #3   Title Patient will reduce timed up and go to <11 seconds to reduce fall risk and demonstrate improved transfer/gait ability.    Baseline 17.54 sec, 06/09/20  8.11    Time 8    Period Weeks    Status Achieved      PT LONG TERM GOAL #4   Title Patient (> 24 years old) will complete five times sit to stand test in < 15 seconds indicating an increased LE strength and improved balance.    Baseline 20.51 sec, 06/09/20 10.53 seconds    Time 8     Period Weeks    Status Achieved                 Plan - 07/07/20 1100    Clinical Impression Statement Patient with heavy ankle strategies with SLS on uneven surface, patient was able to maintain balance between 20-30 seconds bilaterally depending on the trial.  The patient continues to be progressing well.  The patient continues to benefit from skilled PT services to further improve balance for improved quality of life.    Stability/Clinical Decision Making Stable/Uncomplicated    Rehab Potential Good    PT Frequency 2x / week    PT Duration 8 weeks    PT Treatment/Interventions ADLs/Self Care Home Management;Aquatic Therapy;Biofeedback;Canalith Repostioning;Cryotherapy;Iontophoresis 4mg /ml Dexamethasone;Electrical Stimulation;Moist Heat;Traction;Ultrasound;DME Instruction;Gait training;Stair training;Functional mobility training;Therapeutic activities;Therapeutic exercise;Balance training;Neuromuscular re-education;Cognitive remediation;Patient/family education;Manual techniques;Dry needling;Energy conservation;Vestibular    PT Next Visit Plan Continue with balance training    PT Home Exercise Plan Semitandem balance with horiz head turns, full tandem static balance    Consulted and Agree with Plan of Care Patient           Patient will benefit from skilled therapeutic intervention in order to improve the following deficits and impairments:  Decreased balance,Difficulty walking,Decreased strength,Pain  Visit Diagnosis: Muscle weakness (generalized)  Other abnormalities of gait and mobility  Difficulty in walking, not elsewhere classified     Problem List Patient Active Problem List   Diagnosis Date Noted  . Memory loss 07/02/2020  . Pain of right breast 04/13/2020  . Acute pain of both shoulders 01/23/2020  . Essential hypertension 05/14/2019  . Medication side effect, initial encounter 03/20/2019  . Arthralgia 01/21/2019  . Breast mass, right 04/29/2018  .  Generalized abdominal pain 02/19/2018  . GAD (generalized anxiety disorder) 10/30/2017  . MDD (major depressive disorder), single episode, mild ([place]) 10/30/2017  . Chronic insomnia 10/30/2017  . Post-nasal drip 05/04/2017  . Tinnitus of right ear 05/04/2017  . Allergic rhinitis 10/05/2016  . Epidermoid cyst of finger of left hand 07/27/2016  . Osteopenia 11/18/2015  . Chronic constipation 07/20/2015  . Internal hemorrhoid, bleeding 07/20/2015  . 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  Counseling regarding end of life decision making 09/08/2014  . Childhood asthma 08/13/2014  . Gastroesophageal reflux disease without esophagitis 08/13/2014  . Irritable bowel syndrome with constipation 08/13/2014  . Other seasonal allergic rhinitis 08/13/2014  . Hx of migraines 08/13/2014  . Family history of colon cancer 08/13/2014  . Family history of uterine cancer 08/13/2014  . Family history of breast cancer 08/13/2014    Hal Morales PT, DPT 07/07/2020, 11:04 AM  [place] MAIN [facility] SERVICES [address] [place], [place], [address] Phone: [phone]   Fax:  [phone]  Name: Jessica Hardin MRN: [id number] Date of Birth: Jan 08, 1941

## 2020-07-12 ENCOUNTER — Other Ambulatory Visit: Payer: Self-pay

## 2020-07-12 ENCOUNTER — Ambulatory Visit: Payer: PPO

## 2020-07-12 DIAGNOSIS — M6281 Muscle weakness (generalized): Secondary | ICD-10-CM | POA: Diagnosis not present

## 2020-07-12 DIAGNOSIS — R262 Difficulty in walking, not elsewhere classified: Secondary | ICD-10-CM

## 2020-07-12 DIAGNOSIS — R2689 Other abnormalities of gait and mobility: Secondary | ICD-10-CM

## 2020-07-12 NOTE — Therapy (Signed)
[place] MAIN [facility] SERVICES [address] [place], [place], [address] Phone: [phone]   Fax:  [phone]  Physical Therapy Treatment/Re-Certification/Discharge Summary  Patient Details  Name: Jessica Hardin MRN: [id number] Date of Birth: Jun 05, 1941 Referring Provider (PT): Gurney Maxin   Encounter Date: 07/12/2020   PT End of Session - 07/12/20 1017    Visit Number 17    Number of Visits 17    Date for PT Re-Evaluation 06/15/20    PT Start Time 8676    PT Stop Time 1100    PT Time Calculation (min) 45 min    Equipment Utilized During Treatment Gait belt    Activity Tolerance Patient tolerated treatment well;No increased pain    Behavior During Therapy WFL for tasks assessed/performed           Past Medical History:  Diagnosis Date  . Allergy   . Anxiety   . Asthma   . GERD (gastroesophageal reflux disease)   . History of chicken pox   . IBS (irritable bowel syndrome)   . Migraine   . Urinary tract infection     Past Surgical History:  Procedure Laterality Date  . ABDOMINAL HYSTERECTOMY    . CATARACT EXTRACTION, BILATERAL    . TUBAL LIGATION      There were no vitals filed for this visit.   Subjective Assessment - 07/12/20 1017    Subjective The patient reports she is better does not feel as off balance as she did.    Pertinent History Pt reports that she is having trouble with her balance. She states that it started approximately one year ago without any known trigger and has been progressive since that time. She has the most difficulty with head turns while walking. She denies dizziness or vertigo..  She reports recent problems with her memory    Currently in Pain? No/denies    Pain Score 0-No pain           Updated goals.     NEUROMUSCULAR RE-EDUCATION   Bosu step ups f/l x10 each intermittent UE support required Rockerboard balance with small green bal toss x1 min each f/b and s/s static balance Sit  to stands to airex pad with yellow ball self toss x20 Airex SLS 30"x2 each side     Kore Balance Maze 3 levels  Patient able to perform without UE support, challenged with need to reduce  weight shift to complete activity   issued sit to stand, tandem balance, sls and cone step overs f/r/l for HEP.  Handout given and explained to patient.                          PT Education - 07/12/20 1035    Education Details HEP, balance, progress towards goals    Person(s) Educated Patient    Methods Explanation    Comprehension Verbalized understanding            PT Short Term Goals - 07/12/20 1025      PT SHORT TERM GOAL #1   Title Pt will be independent with HEP in order to improve strength and balance in order to decrease fall risk and improve function at home and work.    Baseline 06/07/20  pt performing activities at home to help with balance, purchases cones to tap, 07/12/20  discussed with patient importance of maintaining a regular HEP routine.  patient currently performing HEP 2x a week and  coming into PT 2x/week.  The patient advised to perform exercises 3-4x/week at a minimum.  Reviewe HEP with patient.    Time 4    Period Weeks    Status Achieved    Target Date 05/18/20             PT Long Term Goals - 07/12/20 1014      PT LONG TERM GOAL #1   Title Berg    Baseline 06/07/20 will assess next visit, 06/09/20  [name] 81/56, was 55/56 on IPT    Time 8    Period Weeks    Status Achieved      PT LONG TERM GOAL #2   Title Patient will increase 10 meter walk test to >1.69m/s as to improve gait speed for better community ambulation and to reduce fall risk.    Baseline .98 m/sec, 06/09/20  1.25 m/s, 07/12/20 1.40 m/s    Time 8    Period Weeks    Status Achieved      PT LONG TERM GOAL #3   Title Patient will reduce timed up and go to <11 seconds to reduce fall risk and demonstrate improved transfer/gait ability.    Baseline 17.54 sec, 06/09/20  8.11,  07/12/20 5.98 seconds    Time 8    Period Weeks    Status Achieved      PT LONG TERM GOAL #4   Title Patient (> 48 years old) will complete five times sit to stand test in < 15 seconds indicating an increased LE strength and improved balance.    Baseline 20.51 sec, 06/09/20 10.53 seconds, 07/12/20 10.06 seconds    Time 8    Period Weeks    Status Achieved                 Plan - 07/12/20 1029    Clinical Impression Statement The patient has achieved all therapy goals and has continued to improved in balance, reduced fall risk and measures that assess safe community ambulation. Patient no longer requires skilled PT services at this time and benefits from continued HEP which was discussed and reviewed with the patient.    Stability/Clinical Decision Making Stable/Uncomplicated    Rehab Potential Good    PT Frequency 2x / week    PT Duration 8 weeks    PT Treatment/Interventions ADLs/Self Care Home Management;Aquatic Therapy;Biofeedback;Canalith Repostioning;Cryotherapy;Iontophoresis 4mg /ml Dexamethasone;Electrical Stimulation;Moist Heat;Traction;Ultrasound;DME Instruction;Gait training;Stair training;Functional mobility training;Therapeutic activities;Therapeutic exercise;Balance training;Neuromuscular re-education;Cognitive remediation;Patient/family education;Manual techniques;Dry needling;Energy conservation;Vestibular    PT Next Visit Plan Continue with balance training    PT Home Exercise Plan Semitandem balance with horiz head turns, full tandem static balance    Consulted and Agree with Plan of Care Patient           Patient will benefit from skilled therapeutic intervention in order to improve the following deficits and impairments:  Decreased balance,Difficulty walking,Decreased strength,Pain  Visit Diagnosis: Muscle weakness (generalized)  Other abnormalities of gait and mobility  Difficulty in walking, not elsewhere classified     Problem List Patient Active  Problem List   Diagnosis Date Noted  . Memory loss 07/02/2020  . Pain of right breast 04/13/2020  . Acute pain of both shoulders 01/23/2020  . Essential hypertension 05/14/2019  . Medication side effect, initial encounter 03/20/2019  . Arthralgia 01/21/2019  . Breast mass, right 04/29/2018  . Generalized abdominal pain 02/19/2018  . GAD (generalized anxiety disorder) 10/30/2017  . MDD (major depressive disorder), single episode, mild ([place]) 10/30/2017  .  Chronic insomnia 10/30/2017  . Post-nasal drip 05/04/2017  . Tinnitus of right ear 05/04/2017  . Allergic rhinitis 10/05/2016  . Epidermoid cyst of finger of left hand 07/27/2016  . Osteopenia 11/18/2015  . Chronic constipation 07/20/2015  . Internal hemorrhoid, bleeding 07/20/2015  . 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 Counseling regarding end of life decision making 09/08/2014  . Childhood asthma 08/13/2014  . Gastroesophageal reflux disease without esophagitis 08/13/2014  . Irritable bowel syndrome with constipation 08/13/2014  . Other seasonal allergic rhinitis 08/13/2014  . Hx of migraines 08/13/2014  . Family history of colon cancer 08/13/2014  . Family history of uterine cancer 08/13/2014  . Family history of breast cancer 08/13/2014    Hal Morales PT, DPT 07/12/2020, 11:06 AM  [place] MAIN [facility] SERVICES [place], [place], [address] Phone: [phone]   Fax:  [phone]  Name: Jessica Hardin MRN: [id number] Date of Birth: [birth year]

## 2020-07-14 ENCOUNTER — Ambulatory Visit: Payer: PPO

## 2020-07-19 ENCOUNTER — Ambulatory Visit: Payer: PPO

## 2020-07-21 ENCOUNTER — Ambulatory Visit: Payer: PPO

## 2020-07-22 DIAGNOSIS — G3184 Mild cognitive impairment, so stated: Secondary | ICD-10-CM | POA: Diagnosis not present

## 2020-07-28 ENCOUNTER — Other Ambulatory Visit: Payer: Self-pay

## 2020-07-28 ENCOUNTER — Other Ambulatory Visit: Payer: Self-pay | Admitting: Psychiatry

## 2020-07-28 DIAGNOSIS — G3184 Mild cognitive impairment, so stated: Secondary | ICD-10-CM

## 2020-08-04 DIAGNOSIS — Z713 Dietary counseling and surveillance: Secondary | ICD-10-CM | POA: Diagnosis not present

## 2020-08-04 DIAGNOSIS — R42 Dizziness and giddiness: Secondary | ICD-10-CM | POA: Diagnosis not present

## 2020-08-04 DIAGNOSIS — I10 Essential (primary) hypertension: Secondary | ICD-10-CM | POA: Diagnosis not present

## 2020-08-04 DIAGNOSIS — R82998 Other abnormal findings in urine: Secondary | ICD-10-CM | POA: Diagnosis not present

## 2020-08-04 DIAGNOSIS — F419 Anxiety disorder, unspecified: Secondary | ICD-10-CM | POA: Diagnosis not present

## 2020-08-05 ENCOUNTER — Ambulatory Visit (HOSPITAL_COMMUNITY): Payer: PPO

## 2020-08-05 ENCOUNTER — Encounter (HOSPITAL_COMMUNITY): Payer: Self-pay

## 2020-08-06 ENCOUNTER — Ambulatory Visit (INDEPENDENT_AMBULATORY_CARE_PROVIDER_SITE_OTHER): Payer: PPO | Admitting: Family Medicine

## 2020-08-06 ENCOUNTER — Other Ambulatory Visit: Payer: Self-pay

## 2020-08-06 VITALS — BP 132/78 | HR 78 | Temp 97.4°F | Ht 58.5 in | Wt 123.8 lb

## 2020-08-06 DIAGNOSIS — F411 Generalized anxiety disorder: Secondary | ICD-10-CM

## 2020-08-06 DIAGNOSIS — I10 Essential (primary) hypertension: Secondary | ICD-10-CM

## 2020-08-06 NOTE — Assessment & Plan Note (Addendum)
Encouraged pt to take gabapentin as recommended by psychiatry. 100 mg BID.Marland Kitchen. if helping with sleep can hold trazodone.  Also only use atarax prn to avoid over sedation.   Encouraged husband and wife to consider marriage counseling, finding things to do together, go on walk together to talk... wife/pt feel that she needs to be able to express her feeling to him and talk about it, but he does not talk to her.. encouraged them to " meet in the middle", he does not want to talk but can always listen.

## 2020-08-06 NOTE — Progress Notes (Signed)
Patient ID: Jessica Hardin, female    DOB: [birth year], 80 y.o.   MRN: [id number]  This visit was conducted in person.  BP 132/78   Pulse 78   Temp (!) 97.4 F (36.3 C) (Temporal)   Ht 4' 10.5" (1.486 m)   Wt 123 lb 12 oz (56.1 kg)   SpO2 97%   BMI 25.42 kg/m    CC:  Chief Complaint  Patient presents with  . Follow-up    UC visit 08/04/20 for dizziness     Subjective:   HPI: Jessica Hardin is a 80 y.o. female presenting on 08/06/2020 for Follow-up (UC visit 08/04/20 for dizziness )   Seen at Urgent Care on 2/16 with dizziness, poor balance. ( reviewed note in detail) BP was elevated but had not taken meds.  She had been out of atarax she uses for anxiety. Glucose was low at 64.. had been eating poorly. BP improved on regular medicaitons BP Readings from Last 3 Encounters:  08/06/20 132/78  07/02/20 (!) 142/70  04/13/20 (!) 150/80   She feels  Dizziness is better but still   She feels anxiety is better now.   She has completed PT for balance... did not help much.      Relevant past medical, surgical, family and social history reviewed and updated as indicated. Interim medical history since our last visit reviewed. Allergies and medications reviewed and updated. Outpatient Medications Prior to Visit  Medication Sig Dispense Refill  . Azelastine HCl 0.15 % SOLN Place 2 sprays into both nostrils daily.    Marland Kitchen gabapentin (NEURONTIN) 100 MG capsule TAKE ONE CAPSULE BY MOUTH TWICE DAILY FOR TWO WEEKS. THEN INCREASE TO TAKE TWO CAPSULES BY MOUTH TWICE DAILY AND CONTINUE    . hydrOXYzine (ATARAX/VISTARIL) 10 MG tablet TAKE 1 TABLET BY MOUTH THREE TIMES DAILY AS NEEDED FOR ANXIETY 30 tablet 5  . latanoprost (XALATAN) 0.005 % ophthalmic solution Place 1 drop into both eyes at bedtime.    . meloxicam (MOBIC) 7.5 MG tablet Take 1 tablet (7.5 mg total) by mouth daily as needed for pain. 30 tablet 0  . Multiple Vitamin (MULTIVITAMIN) capsule Take 1 capsule by mouth daily.     .  Olopatadine HCl 0.6 % SOLN Place 1-2 sprays into the nose in the morning and at bedtime.    . traZODone (DESYREL) 50 MG tablet Take 0.5-1 tablets (25-50 mg total) by mouth at bedtime as needed for sleep. 30 tablet 5  . amLODipine (NORVASC) 2.5 MG tablet Take 1 tablet (2.5 mg total) by mouth daily. (Patient not taking: No sig reported) 30 tablet 3   No facility-administered medications prior to visit.     Per HPI unless specifically indicated in ROS section below Review of Systems  Constitutional: Negative for fatigue and fever.  HENT: Negative for ear pain.   Eyes: Negative for pain.  Respiratory: Negative for chest tightness and shortness of breath.   Cardiovascular: Negative for chest pain, palpitations and leg swelling.  Gastrointestinal: Negative for abdominal pain.  Genitourinary: Negative for dysuria.   Objective:  BP 132/78   Pulse 78   Temp (!) 97.4 F (36.3 C) (Temporal)   Ht 4' 10.5" (1.486 m)   Wt 123 lb 12 oz (56.1 kg)   SpO2 97%   BMI 25.42 kg/m   Wt Readings from Last 3 Encounters:  08/06/20 123 lb 12 oz (56.1 kg)  07/02/20 124 lb (56.2 kg)  04/13/20 122 lb 4 oz (55.5 kg)  Physical Exam Constitutional:      General: She is not in acute distress.Vital signs are normal.     Appearance: Normal appearance. She is well-developed and well-nourished. She is not ill-appearing or toxic-appearing.  HENT:     Head: Normocephalic.     Right Ear: Hearing, tympanic membrane, ear canal and external ear normal. Tympanic membrane is not erythematous, retracted or bulging.     Left Ear: Hearing, tympanic membrane, ear canal and external ear normal. Tympanic membrane is not erythematous, retracted or bulging.     Nose: No mucosal edema or rhinorrhea.     Right Sinus: No maxillary sinus tenderness or frontal sinus tenderness.     Left Sinus: No maxillary sinus tenderness or frontal sinus tenderness.     Mouth/Throat:     Mouth: Oropharynx is clear and moist and mucous  membranes are normal.     Pharynx: Uvula midline.  Eyes:     General: Lids are normal. Lids are everted, no foreign bodies appreciated.     Extraocular Movements: EOM normal.     Conjunctiva/sclera: Conjunctivae normal.     Pupils: Pupils are equal, round, and reactive to light.  Neck:     Thyroid: No thyroid mass or thyromegaly.     Vascular: No carotid bruit.     Trachea: Trachea normal.  Cardiovascular:     Rate and Rhythm: Normal rate and regular rhythm.     Pulses: Normal pulses and intact distal pulses.     Heart sounds: Normal heart sounds, S1 normal and S2 normal. No murmur heard. No friction rub. No gallop.   Pulmonary:     Effort: Pulmonary effort is normal. No tachypnea or respiratory distress.     Breath sounds: Normal breath sounds. No decreased breath sounds, wheezing, rhonchi or rales.  Abdominal:     General: Bowel sounds are normal.     Palpations: Abdomen is soft.     Tenderness: There is no abdominal tenderness.  Musculoskeletal:     Cervical back: Normal range of motion and neck supple.  Skin:    General: Skin is warm, dry and intact.     Findings: No rash.  Neurological:     Mental Status: She is alert.  Psychiatric:        Mood and Affect: Mood is not anxious or depressed.        Speech: Speech normal.        Behavior: Behavior normal. Behavior is cooperative.        Thought Content: Thought content normal.        Cognition and Memory: Cognition and memory normal.        Judgment: Judgment normal.       Results for orders placed or performed during the hospital encounter of 07/21/19  Novel Coronavirus, NAA ([facility])   Specimen: Nasopharyngeal(NP) swabs in vial transport medium   NASOPHARYNGE  Result Value Ref Range   SARS-CoV-2, NAA Not Detected Not Detected    This visit occurred during the SARS-CoV-2 public health emergency.  Safety protocols were in place, including screening questions prior to the visit, additional usage of staff PPE, and  extensive cleaning of exam room while observing appropriate contact time as indicated for disinfecting solutions.   COVID 19 screen:  No recent travel or known exposure to COVID19 The patient denies respiratory symptoms of COVID 19 at this time. The importance of social distancing was discussed today.   Assessment and Plan Problem List Items Addressed This Visit  Essential hypertension    Encouraged pt to take BP med daily.  BP better controlled when taking it.      GAD (generalized anxiety disorder) - Primary    Encouraged pt to take gabapentin as recommended by psychiatry. 100 mg BID.Marland Kitchen. if helping with sleep can hold trazodone.  Also only use atarax prn to avoid over sedation.   Encouraged husband and wife to consider marriage counseling, finding things to do together, go on walk together to talk... wife/pt feel that she needs to be able to express her feeling to him and talk about it, but he does not talk to her.. encouraged them to " meet in the middle", he does not want to talk but can always listen.             Eliezer Lofts, MD

## 2020-08-06 NOTE — Patient Instructions (Signed)
Take gabapentin 1 able twice daily.  Use atarax only as needed for panic attack.  Use trazodone at night only if gabapentin ids not helping with sleep.

## 2020-08-06 NOTE — Assessment & Plan Note (Signed)
Encouraged pt to take BP med daily.  BP better controlled when taking it.

## 2020-08-09 ENCOUNTER — Telehealth: Payer: Self-pay | Admitting: Family Medicine

## 2020-08-09 ENCOUNTER — Telehealth: Payer: Self-pay

## 2020-08-09 ENCOUNTER — Other Ambulatory Visit: Payer: Self-pay

## 2020-08-09 NOTE — Telephone Encounter (Signed)
Per pt, she was mistaken about taking it TID and was only taking the venlafaxine once daily. Advised pt and her husband on medications. Pt also wanted to write all her medications down and read them back. Advised a med list would be printed. They would like to pick up the list. Advised if any other questions or concerns to contact this office. Pt and her husband verbalized understanding.   Removed gabapentin from pt list. Updated all other medications with pt and her husband. Printed med list and placed at front for pick up.

## 2020-08-09 NOTE — Progress Notes (Signed)
Did not order amoxicillin. Medications were reconciled from pharmacy sending them in.

## 2020-08-09 NOTE — Telephone Encounter (Signed)
Per Dr. Varney Biles last note... she was supposed to take venlfaxine  XL 150 mg ONCE DAILY not TID as listed in below note. If she is truly using TID that is WAy too much. Speak with husband given pt's dementia/pseudodementia.  Have her use  venlafaxine 150 mg XL, can use trazodone for sleep as she is, stop gabapentin( if taking) and only use hydroxyzine prn for panic attacks. If that is still " too much" she needs to let Dr. Nicolasa Ducking know. Print her out a new med list and mail to her.

## 2020-08-09 NOTE — Addendum Note (Signed)
Addended by: Randall An on: 08/09/2020 03:41 PM   Modules accepted: Orders

## 2020-08-09 NOTE — Telephone Encounter (Signed)
Patient called and wanted to know what medication was the best to take. The medication was Venlafaxine and Hydroxyzine. Informed patient that from our records, we do not have Venlafaxine on her medication list. Instructed patient that she should contact the doctor who prescribed her this medication. Patient verbalized understanding.

## 2020-08-09 NOTE — Telephone Encounter (Signed)
Pt and her husband, Shanon Brow, came to the clinic with the two medications and wanted to know which medication to take. Shanon Brow reported that Dr. Nicolasa Ducking prescribed the Venlafaxine and the Hydroxyzine was prescribed when pt went to [facility] on 2/16. He is not sure which med the pt should be taking. He reports Dr. Nicolasa Ducking is not aware of the venlafaxine being prescribed and the PA at the UC is not aware of the hydroxyzine. Pt reports the venlafaxine is too strong for her. Advised a msg would be sent to PCP and this office would f/u. Advised to hold both meds until this office f/u with her. Pt and her husband verbalized understanding.   Added Venlafaxine to medication list which was 150mg , TID as needed for anxiety.

## 2020-08-09 NOTE — Telephone Encounter (Signed)
Opened in error

## 2020-08-11 ENCOUNTER — Other Ambulatory Visit: Payer: Self-pay

## 2020-08-11 ENCOUNTER — Ambulatory Visit
Admission: RE | Admit: 2020-08-11 | Discharge: 2020-08-11 | Disposition: A | Discharge status: Home / Self Care | Payer: PPO | Source: Ambulatory Visit | Attending: Psychiatry | Admitting: Psychiatry

## 2020-08-11 DIAGNOSIS — G3184 Mild cognitive impairment, so stated: Secondary | ICD-10-CM | POA: Diagnosis not present

## 2020-08-11 DIAGNOSIS — Z0189 Encounter for other specified special examinations: Secondary | ICD-10-CM | POA: Diagnosis not present

## 2020-08-11 DIAGNOSIS — R2689 Other abnormalities of gait and mobility: Secondary | ICD-10-CM | POA: Diagnosis not present

## 2020-08-11 MED ORDER — GADOBUTROL 1 MMOL/ML IV SOLN
5.0000 mL | Freq: Once | INTRAVENOUS | Status: AC | PRN
  Administered 2020-08-11: 5 mL via INTRAVENOUS

## 2020-08-20 DIAGNOSIS — F322 Major depressive disorder, single episode, severe without psychotic features: Secondary | ICD-10-CM | POA: Diagnosis not present

## 2020-08-20 DIAGNOSIS — F411 Generalized anxiety disorder: Secondary | ICD-10-CM | POA: Diagnosis not present

## 2020-08-20 DIAGNOSIS — F5105 Insomnia due to other mental disorder: Secondary | ICD-10-CM | POA: Diagnosis not present

## 2020-08-24 ENCOUNTER — Other Ambulatory Visit: Payer: Self-pay | Admitting: *Deleted

## 2020-08-24 NOTE — Addendum Note (Signed)
Addended by: Carter Kitten on: 08/24/2020 10:18 AM   Modules accepted: Orders

## 2020-09-09 DIAGNOSIS — F322 Major depressive disorder, single episode, severe without psychotic features: Secondary | ICD-10-CM | POA: Diagnosis not present

## 2020-09-09 DIAGNOSIS — F411 Generalized anxiety disorder: Secondary | ICD-10-CM | POA: Diagnosis not present

## 2020-09-09 DIAGNOSIS — F5105 Insomnia due to other mental disorder: Secondary | ICD-10-CM | POA: Diagnosis not present

## 2020-09-10 ENCOUNTER — Telehealth: Payer: Self-pay | Admitting: Family Medicine

## 2020-09-10 ENCOUNTER — Encounter: Payer: Self-pay | Admitting: *Deleted

## 2020-09-10 DIAGNOSIS — F0280 Dementia in other diseases classified elsewhere without behavioral disturbance: Secondary | ICD-10-CM | POA: Insufficient documentation

## 2020-09-10 DIAGNOSIS — G309 Alzheimer's disease, unspecified: Secondary | ICD-10-CM | POA: Insufficient documentation

## 2020-09-10 NOTE — Telephone Encounter (Signed)
I am not sure what happened... probably auto refill... pharmacy should refund?  Please get most update med list ( if pt confusion.. see most recent OV note from psychiatry ( signed and sent to scan on 09/10/2020))

## 2020-09-10 NOTE — Telephone Encounter (Signed)
Patient and husband came into office and expressed concerns as a new refill for trazadone was called in for patient. Patient was told to discontinue medication from a different physician. Worried because they had to pay for it and unsure why it was sent in in the first place.  No record of request for trazadone being requested from pt or pharmacy. Please advise.

## 2020-09-13 NOTE — Telephone Encounter (Signed)
I called [facility] to have them cancel the Trazodone refills in their system.  While on the phone I went over her current medication list to make sure they had everything up to date.  They can't take the Trazodone back but the can give them a refund which would be $2.98.  Mr. Koike notified by telephone that the Trazodone was probably a automatic refill but I have spoken with [facility] and they have cancelled any further refills on the Trazodone.  I also advised him of the refund option if he wanted to do that.  He was okay not getting a refund since it was only $2.98.

## 2020-10-06 DIAGNOSIS — F322 Major depressive disorder, single episode, severe without psychotic features: Secondary | ICD-10-CM | POA: Diagnosis not present

## 2020-10-06 DIAGNOSIS — F411 Generalized anxiety disorder: Secondary | ICD-10-CM | POA: Diagnosis not present

## 2020-10-06 DIAGNOSIS — F5105 Insomnia due to other mental disorder: Secondary | ICD-10-CM | POA: Diagnosis not present

## 2020-11-16 ENCOUNTER — Telehealth: Payer: Self-pay | Admitting: Family Medicine

## 2020-11-16 MED ORDER — AMLODIPINE BESYLATE 2.5 MG PO TABS: 2.5000 mg | ORAL_TABLET | Freq: Every day | ORAL | 3 refills | Status: DC

## 2020-11-16 NOTE — Telephone Encounter (Signed)
Pt husband came in office stating that the pt need refill on medication Amlodipine. He went to urgent care and they said they did not do refillls he would have to got to primary doctor to get refill. Pt huisband went to [facility] yesterday and they only gave her three talbets for emergency. Pt husband would like to be called when medicine is sent in.

## 2020-11-16 NOTE — Telephone Encounter (Signed)
Called and spoke with the patient and she is taking the amlodipine. Patient has stable blood pressures on this medication. Patient was informed that there is a refill for her at the pharmacy. I spoke with Dr. Diona Browner due to the confusion on whether or not she was taking the medication. Dr. Diona Browner stated that it was okay to fill for a year supply. Patient stated she was appreciative and will pick up her Rx this afternoon.

## 2020-12-08 DIAGNOSIS — F411 Generalized anxiety disorder: Secondary | ICD-10-CM | POA: Diagnosis not present

## 2020-12-08 DIAGNOSIS — F5105 Insomnia due to other mental disorder: Secondary | ICD-10-CM | POA: Diagnosis not present

## 2020-12-08 DIAGNOSIS — F322 Major depressive disorder, single episode, severe without psychotic features: Secondary | ICD-10-CM | POA: Diagnosis not present

## 2020-12-23 ENCOUNTER — Telehealth: Payer: Self-pay | Admitting: Family Medicine

## 2020-12-23 DIAGNOSIS — I10 Essential (primary) hypertension: Secondary | ICD-10-CM

## 2020-12-23 NOTE — Telephone Encounter (Signed)
-----   Message from Ellamae Sia sent at 12/06/2020 11:36 AM EDT ----- Regarding: Lab orders for Friday, 7.8.22  AWV lab orders, please.

## 2020-12-24 ENCOUNTER — Other Ambulatory Visit (INDEPENDENT_AMBULATORY_CARE_PROVIDER_SITE_OTHER): Payer: PPO

## 2020-12-24 ENCOUNTER — Other Ambulatory Visit: Payer: Self-pay

## 2020-12-24 DIAGNOSIS — I10 Essential (primary) hypertension: Secondary | ICD-10-CM | POA: Diagnosis not present

## 2020-12-24 LAB — COMPREHENSIVE METABOLIC PANEL
ALT: 18 U/L (ref 0–35)
AST: 25 U/L (ref 0–37)
Albumin: 4.1 g/dL (ref 3.5–5.2)
Alkaline Phosphatase: 74 U/L (ref 39–117)
BUN: 19 mg/dL (ref 6–23)
CO2: 31 mEq/L (ref 19–32)
Calcium: 9.4 mg/dL (ref 8.4–10.5)
Chloride: 103 mEq/L (ref 96–112)
Creatinine, Ser: 1.02 mg/dL (ref 0.40–1.20)
GFR: 51.67 mL/min — ABNORMAL LOW (ref >60.00)
Glucose, Bld: 92 mg/dL (ref 70–99)
Potassium: 3.8 mEq/L (ref 3.5–5.1)
Sodium: 141 mEq/L (ref 135–145)
Total Bilirubin: 0.4 mg/dL (ref 0.2–1.2)
Total Protein: 7.5 g/dL (ref 6.0–8.3)

## 2020-12-24 LAB — LIPID PANEL
Cholesterol: 221 mg/dL — ABNORMAL HIGH (ref 0–200)
HDL: 84.4 mg/dL (ref >39.00)
LDL Cholesterol: 127 mg/dL — ABNORMAL HIGH (ref 0–99)
NonHDL: 137.08
Total CHOL/HDL Ratio: 3
Triglycerides: 52 mg/dL (ref 0.0–149.0)
VLDL: 10.4 mg/dL (ref 0.0–40.0)

## 2020-12-24 NOTE — Progress Notes (Signed)
No critical labs need to be addressed urgently. We will discuss labs in detail at upcoming office visit.   

## 2020-12-31 ENCOUNTER — Ambulatory Visit (INDEPENDENT_AMBULATORY_CARE_PROVIDER_SITE_OTHER): Payer: PPO | Admitting: Family Medicine

## 2020-12-31 ENCOUNTER — Other Ambulatory Visit: Payer: Self-pay

## 2020-12-31 ENCOUNTER — Encounter: Payer: Self-pay | Admitting: Family Medicine

## 2020-12-31 VITALS — BP 138/82 | HR 75 | Temp 97.9°F | Ht 58.25 in | Wt 131.0 lb

## 2020-12-31 DIAGNOSIS — Z23 Encounter for immunization: Secondary | ICD-10-CM | POA: Diagnosis not present

## 2020-12-31 DIAGNOSIS — G8929 Other chronic pain: Secondary | ICD-10-CM | POA: Diagnosis not present

## 2020-12-31 DIAGNOSIS — M25562 Pain in left knee: Secondary | ICD-10-CM

## 2020-12-31 DIAGNOSIS — Z0000 Encounter for general adult medical examination without abnormal findings: Secondary | ICD-10-CM

## 2020-12-31 DIAGNOSIS — G309 Alzheimer's disease, unspecified: Secondary | ICD-10-CM

## 2020-12-31 DIAGNOSIS — I10 Essential (primary) hypertension: Secondary | ICD-10-CM

## 2020-12-31 DIAGNOSIS — F0280 Dementia in other diseases classified elsewhere without behavioral disturbance: Secondary | ICD-10-CM | POA: Diagnosis not present

## 2020-12-31 DIAGNOSIS — F320 Major depressive disorder, single episode, mild: Secondary | ICD-10-CM

## 2020-12-31 DIAGNOSIS — J302 Other seasonal allergic rhinitis: Secondary | ICD-10-CM

## 2020-12-31 DIAGNOSIS — E7800 Pure hypercholesterolemia, unspecified: Secondary | ICD-10-CM | POA: Diagnosis not present

## 2020-12-31 DIAGNOSIS — M25561 Pain in right knee: Secondary | ICD-10-CM

## 2020-12-31 MED ORDER — ATORVASTATIN CALCIUM 10 MG PO TABS: 10.0000 mg | ORAL_TABLET | Freq: Every day | ORAL | 11 refills | Status: DC

## 2020-12-31 NOTE — Progress Notes (Signed)
Patient ID: Jessica Hardin, female    DOB: 1940-11-18, 80 y.o.   MRN: [id number]  This visit was conducted in person.  BP 138/82   Pulse 75   Temp 97.9 F (36.6 C) (Temporal)   Ht 4' 10.25" (1.48 m)   Wt 131 lb (59.4 kg)   SpO2 97%   BMI 27.14 kg/m    CC:  Chief Complaint  Patient presents with   Annual Exam    Subjective:   HPI: The patient presents for annual medicare wellness, complete physical and review of chronic health problems. He/She also has the following acute concerns today:  I have personally reviewed the Medicare Annual Wellness questionnaire and have noted 1. The patient's medical and social history 2. Their use of alcohol, tobacco or illicit drugs 3. Their current medications and supplements 4. The patient's functional ability including ADL's, fall risks, home safety risks and hearing or visual             impairment. 5. Diet and physical activities 6. Evidence for depression or mood disorders 7.         Updated provider list Cognitive evaluation was performed and recorded on pt medicare questionnaire form. The patients weight, height, BMI and visual acuity have been recorded in the chart   I have made referrals, counseling and provided education to the patient based review of the above and I have provided the pt with a written personalized care plan for preventive services.   Documentation of this information was scanned into the electronic record under the media tab.   Advance directives and end of life planning reviewed in detail with patient and documented in EMR. Patient given handout on advance care directives if needed. HCPOA and living will updated if needed.  Screening hearing and vision: done  Fall Risk  12/31/2020 12/16/2019 12/03/2018 01/15/2018 01/30/2017  Falls in the past year? 0 0 0 No No  Comment - - - pt does not remember any falls but says she might have -  Number falls in past yr: 0 - 0 - -  Injury with Fall? 0 - 0 - -  Risk for fall  due to : - - - Impaired balance/gait -   Flowsheet Row Office Visit from 12/31/2020 in [place] [facility]  PHQ-2 Total Score 0      MDD, GAD, chronic insomnia: Followed by psychiatry. Reviewed Dr. Waylan Boga last [place].   Alzheimer's disease followed by neurology, Dr. Melrose Nakayama: On donezepil    Reviewed MrI brain from 08/11/2020  IMPRESSION: 1. No acute intracranial abnormality. 2. Slight progression of mild cerebral atrophy. 3. Minimal chronic small vessel ischemic disease.  Hypertension:    Good control on amlodipine BP Readings from Last 3 Encounters:  12/31/20 138/82  08/06/20 132/78  07/02/20 (!) 142/70  Using medication without problems or lightheadedness: none Chest pain with exertion: none Edema:none Short of breath: none Average home BPs: Other issues:  Diet healthy  Body mass index is 27.14 kg/m.  Wt Readings from Last 3 Encounters:  12/31/20 131 lb (59.4 kg)  08/06/20 123 lb 12 oz (56.1 kg)  07/02/20 124 lb (56.2 kg)    Exercise: walking off and on.  Reviewed labs in detail with pt  Lab Results  Component Value Date   CHOL 221 (H) 12/24/2020   HDL 84.40 12/24/2020   LDLCALC 127 (H) 12/24/2020   TRIG 52.0 12/24/2020   CHOLHDL 3 12/24/2020  Relevant past medical, surgical, family and social history reviewed and updated as indicated. Interim medical history since our last visit reviewed. Allergies and medications reviewed and updated. Outpatient Medications Prior to Visit  Medication Sig Dispense Refill   amLODipine (NORVASC) 2.5 MG tablet Take 1 tablet (2.5 mg total) by mouth daily. 90 tablet 3   Azelastine HCl 0.15 % SOLN Place 2 sprays into both nostrils daily.     donepezil (ARICEPT) 5 MG tablet Take 5 mg by mouth at bedtime.     hydrOXYzine (ATARAX/VISTARIL) 10 MG tablet TAKE 1 TABLET BY MOUTH THREE TIMES DAILY AS NEEDED FOR ANXIETY 30 tablet 5   latanoprost (XALATAN) 0.005 % ophthalmic solution Place 1 drop into both eyes at  bedtime.     meloxicam (MOBIC) 7.5 MG tablet Take 1 tablet (7.5 mg total) by mouth daily as needed for pain. 30 tablet 0   mirtazapine (REMERON) 15 MG tablet Take 15 mg by mouth at bedtime.     Multiple Vitamin (MULTIVITAMIN) capsule Take 1 capsule by mouth daily.      Olopatadine HCl 0.6 % SOLN Place 1-2 sprays into the nose in the morning and at bedtime.     traZODone (DESYREL) 50 MG tablet Take 25-50 mg by mouth at bedtime as needed.     venlafaxine XR (EFFEXOR-XR) 150 MG 24 hr capsule Take 150 mg by mouth daily.     No facility-administered medications prior to visit.     Per HPI unless specifically indicated in ROS section below Review of Systems  Constitutional:  Negative for fatigue and fever.  HENT:  Negative for congestion.   Eyes:  Negative for pain.  Respiratory:  Negative for cough and shortness of breath.   Cardiovascular:  Negative for chest pain, palpitations and leg swelling.  Gastrointestinal:  Negative for abdominal pain.  Genitourinary:  Negative for dysuria and vaginal bleeding.  Musculoskeletal:  Negative for back pain.  Neurological:  Negative for syncope, light-headedness and headaches.  Psychiatric/Behavioral:  Negative for dysphoric mood.   Objective:  BP 138/82   Pulse 75   Temp 97.9 F (36.6 C) (Temporal)   Ht 4' 10.25" (1.48 m)   Wt 131 lb (59.4 kg)   SpO2 97%   BMI 27.14 kg/m   Wt Readings from Last 3 Encounters:  12/31/20 131 lb (59.4 kg)  08/06/20 123 lb 12 oz (56.1 kg)  07/02/20 124 lb (56.2 kg)      Physical Exam Constitutional:      General: She is not in acute distress.    Appearance: Normal appearance. She is well-developed. She is not ill-appearing or toxic-appearing.  HENT:     Head: Normocephalic.     Right Ear: Hearing, tympanic membrane, ear canal and external ear normal. Tympanic membrane is not erythematous, retracted or bulging.     Left Ear: Hearing, tympanic membrane, ear canal and external ear normal. Tympanic membrane is  not erythematous, retracted or bulging.     Nose: No mucosal edema or rhinorrhea.     Right Sinus: No maxillary sinus tenderness or frontal sinus tenderness.     Left Sinus: No maxillary sinus tenderness or frontal sinus tenderness.     Mouth/Throat:     Pharynx: Uvula midline.  Eyes:     General: Lids are normal. Lids are everted, no foreign bodies appreciated.     Conjunctiva/sclera: Conjunctivae normal.     Pupils: Pupils are equal, round, and reactive to light.  Neck:     Thyroid: No  thyroid mass or thyromegaly.     Vascular: No carotid bruit.     Trachea: Trachea normal.  Cardiovascular:     Rate and Rhythm: Normal rate and regular rhythm.     Pulses: Normal pulses.     Heart sounds: Normal heart sounds, S1 normal and S2 normal. No murmur heard.   No friction rub. No gallop.  Pulmonary:     Effort: Pulmonary effort is normal. No tachypnea or respiratory distress.     Breath sounds: Normal breath sounds. No decreased breath sounds, wheezing, rhonchi or rales.  Abdominal:     General: Bowel sounds are normal.     Palpations: Abdomen is soft.     Tenderness: There is no abdominal tenderness.  Musculoskeletal:     Cervical back: Normal range of motion and neck supple.  Skin:    General: Skin is warm and dry.     Findings: No rash.  Neurological:     Mental Status: She is alert.  Psychiatric:        Mood and Affect: Mood is not anxious or depressed.        Speech: Speech normal.        Behavior: Behavior normal. Behavior is cooperative.        Thought Content: Thought content normal.        Judgment: Judgment normal.      Results for orders placed or performed in visit on 12/24/20  Comprehensive metabolic panel  Result Value Ref Range   Sodium 141 135 - 145 mEq/L   Potassium 3.8 3.5 - 5.1 mEq/L   Chloride 103 96 - 112 mEq/L   CO2 31 19 - 32 mEq/L   Glucose, Bld 92 70 - 99 mg/dL   BUN 19 6 - 23 mg/dL   Creatinine, Ser 1.02 0.40 - 1.20 mg/dL   Total Bilirubin 0.4 0.2  - 1.2 mg/dL   Alkaline Phosphatase 74 39 - 117 U/L   AST 25 0 - 37 U/L   ALT 18 0 - 35 U/L   Total Protein 7.5 6.0 - 8.3 g/dL   Albumin 4.1 3.5 - 5.2 g/dL   GFR 51.67 (L) >60.00 mL/min   Calcium 9.4 8.4 - 10.5 mg/dL  Lipid panel  Result Value Ref Range   Cholesterol 221 (H) 0 - 200 mg/dL   Triglycerides 52.0 0.0 - 149.0 mg/dL   HDL 84.40 >39.00 mg/dL   VLDL 10.4 0.0 - 40.0 mg/dL   LDL Cholesterol 127 (H) 0 - 99 mg/dL   Total CHOL/HDL Ratio 3    NonHDL 137.08     This visit occurred during the SARS-CoV-2 public health emergency.  Safety protocols were in place, including screening questions prior to the visit, additional usage of staff PPE, and extensive cleaning of exam room while observing appropriate contact time as indicated for disinfecting solutions.   COVID 19 screen:  No recent travel or known exposure to COVID19 The patient denies respiratory symptoms of COVID 19 at this time. The importance of social distancing was discussed today.   Assessment and Plan   The patient's preventative maintenance and recommended screening tests for an annual wellness exam were reviewed in full today. Brought up to date unless services declined.  Counselled on the importance of diet, exercise, and its role in overall health and mortality. The patient's FH and SH was reviewed, including their home life, tobacco status, and drug and alcohol status.    COVID x 3 Consider shingrix, due for PNA vaccine  Last mammo 01/2018,  Sister with breast cancer, Due Colonoscopy: colonoscopy nml 2008  Dr. Tiffany Kocher. Repeat was due in 5 years.  Since then saw Dr. Delano Metz, [facility]. 2009 sigmoid nml! Brother with colon cancer.  No indicated  PAP/DVE: TAH, not indicated DXA: 11/2015 osteopenia, repeat in 5 years.Marland Kitchen scheudled Nonsmoker  Problem List Items Addressed This Visit     Allergic rhinitis     Can restart flonase 2 sprays per nostril.      Alzheimer disease [facility])      Alzheimer's disease  followed by neurology, Dr. Melrose Nakayama: On donezepil       Bilateral knee pain    Can use over the counter Voltaren gel  up to four times daily on knees for pain.       Essential hypertension    Stable, chronic.  Continue current medication.    amlodipine 2.5 mg daily.      Relevant Medications   atorvastatin (LIPITOR) 10 MG tablet   Hypercholesteremia    Inadequate control Work decreasing animal fat in det.. eating more lean meats, less fried foods, fatty.  Start atorvastatin 10 mg daily to lower bad cholesterol and decrease risk of heart attack and stroke.      Relevant Medications   atorvastatin (LIPITOR) 10 MG tablet   MDD (major depressive disorder), single episode, mild (HCC)    Stable, chronic.  Continue current medication.    Followed by psychiatry.       Other Visit Diagnoses     Need for vaccination against Streptococcus pneumoniae    -  Primary   Relevant Orders   Pneumococcal conjugate vaccine 20-valent (Prevnar 20) (Completed)   Medicare annual wellness visit, subsequent            Eliezer Lofts, MD

## 2020-12-31 NOTE — Assessment & Plan Note (Signed)
Stable, chronic.  Continue current medication.    Followed by psychiatry.

## 2020-12-31 NOTE — Patient Instructions (Addendum)
Can restart flonase 2 sprays per nostril.  Work on regular 3 times a week, indoor.  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 eating more lean meats, less fried foods, fatty.  Start atorvastatin 10 mg daily to lower bad cholesterol and decrease risk of heart attack and stroke.  Consider shingrix vaccine.  Can use over the counter Voltaren gel  up to four times daily on knees for pain.

## 2020-12-31 NOTE — Assessment & Plan Note (Signed)
Can restart flonase 2 sprays per nostril.

## 2020-12-31 NOTE — Assessment & Plan Note (Signed)
Stable, chronic.  Continue current medication.    amlodipine 2.5 mg daily.

## 2021-01-03 DIAGNOSIS — Z961 Presence of intraocular lens: Secondary | ICD-10-CM | POA: Diagnosis not present

## 2021-01-18 ENCOUNTER — Other Ambulatory Visit: Payer: Self-pay

## 2021-01-18 ENCOUNTER — Emergency Department: Payer: PPO

## 2021-01-18 ENCOUNTER — Telehealth: Payer: Self-pay

## 2021-01-18 ENCOUNTER — Emergency Department
Admission: EM | Admit: 2021-01-18 | Discharge: 2021-01-19 | Disposition: A | Discharge status: Home / Self Care | Payer: PPO | Attending: Emergency Medicine | Admitting: Emergency Medicine

## 2021-01-18 DIAGNOSIS — G309 Alzheimer's disease, unspecified: Secondary | ICD-10-CM | POA: Insufficient documentation

## 2021-01-18 DIAGNOSIS — Z79899 Other long term (current) drug therapy: Secondary | ICD-10-CM | POA: Diagnosis not present

## 2021-01-18 DIAGNOSIS — J45909 Unspecified asthma, uncomplicated: Secondary | ICD-10-CM | POA: Insufficient documentation

## 2021-01-18 DIAGNOSIS — R42 Dizziness and giddiness: Secondary | ICD-10-CM | POA: Diagnosis not present

## 2021-01-18 DIAGNOSIS — I10 Essential (primary) hypertension: Secondary | ICD-10-CM | POA: Diagnosis not present

## 2021-01-18 DIAGNOSIS — R519 Headache, unspecified: Secondary | ICD-10-CM | POA: Diagnosis not present

## 2021-01-18 LAB — CBC
HCT: 39.9 % (ref 36.0–46.0)
Hemoglobin: 13.1 g/dL (ref 12.0–15.0)
MCH: 31.7 pg (ref 26.0–34.0)
MCHC: 32.8 g/dL (ref 30.0–36.0)
MCV: 96.6 fL (ref 80.0–100.0)
Platelets: 180 10*3/uL (ref 150–400)
RBC: 4.13 MIL/uL (ref 3.87–5.11)
RDW: 12.9 % (ref 11.5–15.5)
WBC: 4.7 10*3/uL (ref 4.0–10.5)
nRBC: 0 % (ref 0.0–0.2)

## 2021-01-18 LAB — BASIC METABOLIC PANEL
Anion gap: 5 (ref 5–15)
BUN: 12 mg/dL (ref 8–23)
CO2: 31 mmol/L (ref 22–32)
Calcium: 9.5 mg/dL (ref 8.9–10.3)
Chloride: 104 mmol/L (ref 98–111)
Creatinine, Ser: 0.84 mg/dL (ref 0.44–1.00)
GFR, Estimated: >60 mL/min (ref >60)
Glucose, Bld: 91 mg/dL (ref 70–99)
Potassium: 3.6 mmol/L (ref 3.5–5.1)
Sodium: 140 mmol/L (ref 135–145)

## 2021-01-18 LAB — TROPONIN I (HIGH SENSITIVITY)
Troponin I (High Sensitivity): 18 ng/L — ABNORMAL HIGH (ref <18)
Troponin I (High Sensitivity): 17 ng/L (ref <18)

## 2021-01-18 NOTE — ED Notes (Signed)
Pt ambulatory to RR att unassisted.

## 2021-01-18 NOTE — Telephone Encounter (Signed)
[facility] - Client TELEPHONE ADVICE RECORD [facility] Patient Name: Brinleigh OR RIDGE Gender: Female DOB: [birth year] Age: 80 Y 39 M 11 D Return Phone Number: [phone] (Primary) Address: City/ State/ Zip: [place] [address] Client [facility] - Client Client Site [facility] - Day Physician Eliezer Lofts - MD Contact Type Call Who Is Calling Patient / Member / Family / Caregiver Call Type Triage / Clinical Relationship To Patient Self Return Phone Number [phone] (Primary) Chief Complaint Dizziness Reason for Call Symptomatic / Request for Health Information Initial Comment Transferred from office, no appts open today. PT has been dizzy and BP has been up. BP 159/??. PT did not have the bottome number. [place] Not Listed ER Translation No Nurse Assessment Nurse: Donna Christen, RN, Legrand Como Date/Time Eilene Ghazi Time): 01/18/2021 3:11:09 PM Confirm and document reason for call. If symptomatic, describe symptoms. ---Transferred from office, no appts open today. PT has been dizzy and BP has been up. SBP 159 and now 168. Feeling dizzy and out of it. Started yesterday. Does the patient have any new or worsening symptoms? ---Yes Will a triage be completed? ---Yes Related visit to physician within the last 2 weeks? ---No Does the PT have any chronic conditions? (i.e. diabetes, asthma, this includes High risk factors for pregnancy, etc.) ---Yes List chronic conditions. ---HTN Is this a behavioral health or substance abuse call? ---No Guidelines Guideline Title Affirmed Question Affirmed Notes Nurse Date/Time (Eastern Time) Blood Pressure - High [id number] Systolic BP >= 0000000 OR Diastolic >= [id number] AND [id number] cardiac or neurologic symptoms (e.g., chest pain, difficulty breathing, unsteady gait, blurred vision) Darleen Crocker 01/18/2021 3:12:05 PM PLEASE NOTE: All timestamps contained within  this report are represented as [place] Standard Time. CONFIDENTIALTY NOTICE: This fax transmission is intended only for the addressee. It contains information that is legally privileged, confidential or otherwise protected from use or disclosure. If you are not the intended recipient, you are strictly prohibited from reviewing, disclosing, copying using or disseminating any of this information or taking any action in reliance on or regarding this information. If you have received this fax in error, please notify us immediately by telephone so that we can arrange for its return to [place]. Phone: [phone], Toll-Free: [phone], Fax: [phone] Page: 2 of 2 Call Id: [id number] [place]. Time Eilene Ghazi Time) Disposition Final User 01/18/2021 3:13:49 PM Go to ED Now Yes Donna Christen, RN, Gerome Sam Disagree/Comply Comply Caller Understands Yes PreDisposition InappropriateToAsk Care Advice Given Per Guideline GO TO ED NOW: CARE ADVICE given per High Blood Pressure (Adult) guideline. Referrals GO TO FACILITY OTHER - SPECIFY

## 2021-01-18 NOTE — ED Triage Notes (Signed)
Pt presents to ER stating she went to UC today d/t her blood pressure being high when she checked it at home.  Pt states her BP's were around [id number] systolic. Pt denies any stroke like symptoms at this time.  Pt states she takes 2.5 mg amlodipine at home and took her it this morning.  Pt A&Ox4 at this time.

## 2021-01-18 NOTE — ED Provider Notes (Signed)
[facility] Emergency Department Provider Note  ____________________________________________   Event Date/Time   First MD Initiated Contact with Patient 01/18/21 2144     (approximate)  I have reviewed the triage vital signs and the nursing notes.   HISTORY  Chief Complaint Hypertension    HPI Jessica Hardin is a 80 y.o. female with dementia who comes in with concerns for elevated blood pressures.  Patient reports over the past few months her blood pressures were trending up higher but over the past month its been even higher and she was concerned.  She does report some dizziness and feeling a little off balance but states has been going on for months now.  Denies any headache or dizziness at rest.  Denies that it was an acute change today.  Patient still able to ambulate without any assistance.  Denies any headaches.  Denies any chest pain, shortness of breath, leg swelling.  On review of records patient had MRI back in February that was reassuring.  She does report that she might of had some of her similar symptoms then.          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/2017   Chronic insomnia 10/30/2017   Allergic rhinitis 10/05/2016   Osteopenia 11/18/2015   Chronic constipation 07/20/2015   Internal hemorrhoid, bleeding 07/20/2015   Counseling regarding end of life decision making 09/08/2014   Gastroesophageal reflux disease  without esophagitis 08/13/2014   Irritable bowel syndrome with constipation 08/13/2014   Hx of migraines 08/13/2014   Family history of colon cancer 08/13/2014   Family history of uterine cancer 08/13/2014   Family history of breast cancer 08/13/2014    Past Surgical History:  Procedure Laterality Date   ABDOMINAL HYSTERECTOMY     CATARACT EXTRACTION, BILATERAL     TUBAL LIGATION      Prior to Admission medications   Medication Sig Start Date End Date Taking? Authorizing Provider  amLODipine (NORVASC) 2.5 MG tablet Take 1 tablet (2.5 mg total) by mouth daily. 11/16/20   Bedsole, Amy E, MD  atorvastatin (LIPITOR) 10 MG tablet Take 1 tablet (10 mg total) by mouth daily. 12/31/20   Bedsole, Amy E, MD  Azelastine HCl 0.15 % SOLN Place 2 sprays into both nostrils daily. 07/01/19   [provider]  donepezil (ARICEPT) 5 MG tablet Take 5 mg by mouth at bedtime.    [provider]  hydrOXYzine (ATARAX/VISTARIL) 10 MG tablet TAKE 1 TABLET BY MOUTH THREE TIMES DAILY AS NEEDED FOR ANXIETY 10/28/19   Bedsole, Amy E, MD  latanoprost (XALATAN) 0.005 % ophthalmic solution Place 1 drop into both eyes at bedtime.    [provider]  meloxicam (MOBIC) 7.5 MG tablet Take 1 tablet (7.5 mg total) by mouth daily as needed for pain. 01/23/20   Bedsole, Amy E, MD  mirtazapine (REMERON) 15 MG tablet Take 15 mg by mouth at  bedtime.    [provider]  Multiple Vitamin (MULTIVITAMIN) capsule Take 1 capsule by mouth daily.     [provider]  Olopatadine HCl 0.6 % SOLN Place 1-2 sprays into the nose in the morning and at bedtime.    [provider]  traZODone (DESYREL) 50 MG tablet Take 25-50 mg by mouth at bedtime as needed. 09/09/20   [provider]  venlafaxine XR (EFFEXOR-XR) 150 MG 24 hr capsule Take 150 mg by mouth daily. 05/27/20   [provider]    Allergies Shellfish allergy  Family History  Problem Relation Age of Onset   Arthritis  Mother    Hypertension Mother    Arthritis Sister    Hypertension Sister    Colon cancer Brother 28   Breast cancer Sister    Breast cancer Sister    Uterine cancer Sister 45   Multiple sclerosis Daughter     Social History Social History   Tobacco Use   Smoking status: Never   Smokeless tobacco: Never  Vaping Use   Vaping Use: Never used  Substance Use Topics   Alcohol use: No    Alcohol/week: 0.0 standard drinks   Drug use: No      Review of Systems Constitutional: No fever/chills Eyes: No visual changes. ENT: No sore throat. Cardiovascular: Denies chest pain. Respiratory: Denies shortness of breath. Gastrointestinal: No abdominal pain.  No nausea, no vomiting.  No diarrhea.  No constipation. Genitourinary: Negative for dysuria. Musculoskeletal: Negative for back pain. Skin: Negative for rash. Neurological: Negative for headaches, focal weakness or numbness.  Feeling off balance All other ROS negative ____________________________________________   PHYSICAL EXAM:  VITAL SIGNS: ED Triage Vitals  Enc Vitals Group     BP 01/18/21 1938 (!) 155/93     Pulse Rate 01/18/21 1938 66     Resp 01/18/21 1938 18     Temp 01/18/21 1938 98.3 F (36.8 C)     Temp Source 01/18/21 1938 Oral     SpO2 01/18/21 1938 97 %     Weight 01/18/21 1939 135 lb (61.2 kg)     Height 01/18/21 1939 '4\' 11"'$  (1.499 m)     Head Circumference --      Peak Flow --      Pain Score 01/18/21 1939 0     Pain Loc --      Pain Edu? --      Excl. in [place]? --     Constitutional: Alert and oriented. Well appearing and in no acute distress. Eyes: Conjunctivae are normal. EOMI. Head: Atraumatic. Nose: No congestion/rhinnorhea. Mouth/Throat: Mucous membranes are moist.   Neck: No stridor. Trachea Midline. FROM Cardiovascular: Normal rate, regular rhythm. Grossly normal heart sounds.  Good peripheral circulation. Respiratory: Normal respiratory effort.  No retractions. Lungs  CTAB. Gastrointestinal: Soft and nontender. No distention. No abdominal bruits.  Musculoskeletal: No lower extremity tenderness nor edema.  No joint effusions. Neurologic:  Normal speech and language. No gross focal neurologic deficits are appreciated.  Equal strength in arms and legs.  Finger-to-nose intact bilaterally patient is able to ambulate with wedge heels on without any significant ataxia.  States that she just feels a little off balance. Skin:  Skin is warm, dry and intact. No rash noted. Psychiatric: Mood and affect are normal. Speech and behavior are normal. GU: Deferred   ____________________________________________   LABS (all labs ordered are listed, but only abnormal results are displayed)  Labs Reviewed  TROPONIN I (HIGH SENSITIVITY) -  Abnormal; Notable for the following components:      Result Value   Troponin I (High Sensitivity) 18 (*)    All other components within normal limits  CBC  BASIC METABOLIC PANEL  TROPONIN I (HIGH SENSITIVITY)   ____________________________________________   ED ECG REPORT I, Vanessa [place], the attending physician, personally viewed and interpreted this ECG.  Normal sinus no st elevation, twi 5/v6 normal intervals  ____________________________________________  RADIOLOGY Robert Bellow, personally viewed and evaluated these images (plain radiographs) as part of my medical decision making, as well as reviewing the written report by the radiologist.  ED MD interpretation:  ct head negative   Official radiology report(s): CT HEAD WO CONTRAST (5MM)  Result Date: 01/18/2021 CLINICAL DATA:  Dizziness. EXAM: CT HEAD WITHOUT CONTRAST TECHNIQUE: Contiguous axial images were obtained from the base of the skull through the vertex without intravenous contrast. COMPARISON:  August 04, 2009 FINDINGS: Brain: There is mild cerebral atrophy with widening of the extra-axial spaces and ventricular dilatation. There are areas of decreased attenuation  within the white matter tracts of the supratentorial brain, consistent with microvascular disease changes. Vascular: No hyperdense vessel or unexpected calcification. Skull: Normal. Negative for fracture or focal lesion. Sinuses/Orbits: No acute finding. Other: None. IMPRESSION: 1. Generalized cerebral atrophy. 2. No acute intracranial abnormality. Electronically Signed   By: Virgina Norfolk M.D.   On: 01/18/2021 22:41    ____________________________________________   PROCEDURES  Procedure(s) performed (including Critical Care):  Procedures   ____________________________________________   INITIAL IMPRESSION / ASSESSMENT AND PLAN / ED COURSE  Jessica Hardin was evaluated in Emergency Department on 01/18/2021 for the symptoms described in the history of present illness. She was evaluated in the context of the global COVID-19 pandemic, which necessitated consideration that the patient might be at risk for infection with the SARS-CoV-2 virus that causes COVID-19. Institutional protocols and algorithms that pertain to the evaluation of patients at risk for COVID-19 are in a state of rapid change based on information released by regulatory bodies including the CDC and federal and state organizations. These policies and algorithms were followed during the patient's care in the ED.    Patient is a 80 year old who comes in with concern for elevated blood pressures.  Patient reports they have been elevated for the past few months but getting worse over the past few weeks.  Patient denies any headaches or dizziness at rest.  States that she has a little bit of difficulties with walking but that is been going on for weeks to months stated that she felt little off balance.  CT head was ordered to make sure no evidence of intercranial hemorrhage or hydrocephalus.  However her neuro exam is completely intact she denies dizziness at rest and she is ambulatory without any ataxia so I have low suspicion for acute  stroke given the off-balance this has been going on for months.  Patient also had prior MRI that is been reassuring.  This was after patient was seen for dizziness back in February 2022 so I suspect that the symptoms have been going on since this reassuring MRI.  Patient is also wearing wedge heels and still ambulates very well.  Labs ordered to evaluate for Electra MIs, AKI, ACS.  Labs are reassuring.  CT scan is reassuring.  Initial troponin was slightly elevated but on repeat was downtrending.  Patient denies any chest pain.  Unlikely to be ACS.  Her blood pressure is slightly elevated.  I recommended increasing  her amlodipine and taking her blood pressure once the morning once at night and following up with her PCP for recheck.  Patient expressed understanding.  Husband is at bedside who agrees that this is not a sudden change for her and agrees with holding off on additional imaging at this time and can follow-up with PCP due to reassuring work-up at this time           ____________________________________________   FINAL CLINICAL IMPRESSION(S) / ED DIAGNOSES   Final diagnoses:  Hypertension, unspecified type      MEDICATIONS GIVEN DURING THIS VISIT:  Medications - No data to display   ED Discharge Orders     None        Note:  This document was prepared using Dragon voice recognition software and may include unintentional dictation errors.    Vanessa [place], MD 01/18/21 [phone]

## 2021-01-18 NOTE — Telephone Encounter (Signed)
Please see note below access nurse note; pt going to [facility] in [place].

## 2021-01-18 NOTE — Telephone Encounter (Addendum)
Noted. Can we call to check on her? See if she went to UC? Get her in for follow up with Dr. Diona Browner upon her return?  BP Readings from Last 3 Encounters:  12/31/20 138/82  08/06/20 132/78  07/02/20 (!) 142/70

## 2021-01-18 NOTE — Discharge Instructions (Addendum)
You can increase your amlodipine to 5 mg once in the morning for the next 5 days.  Record your blood pressures for your primary care doctor.  Return to the ER for any other concerns.

## 2021-01-18 NOTE — Telephone Encounter (Signed)
I spoke with pt; pt said starting on 01/17/21 BP started going up for no known reason; on 01/17/21 pts BP 159/?. Now BP 171/94 P 79. Pt has been taking amlodipine 2.5 mg in AM and pt has not missed taking med. Pt is lightheaded on and off; the room does not spin. No H/A,CP,SOB,or vision changes.no weakness in extremities.  No available appts at [facility] today or tomorrow. Mr Delorey will take pt to [facility] now for eval.sending note to DR Diona Browner who is out of office, Gentry Fitz NP and Butch Penny CMA.

## 2021-01-19 NOTE — Telephone Encounter (Signed)
Jessica Hardin was seen at [facility] ER.  Dr. Diona Browner has no available appointments next week.  Please advise.

## 2021-01-19 NOTE — Telephone Encounter (Signed)
ED notes reviewed, labs and CT reassuring. Advised in ED to increase amlodipine, monitor BP and follow up with PCP.   Will send to PCP to determine when she'd like to see her for follow up.

## 2021-01-20 NOTE — Telephone Encounter (Signed)
Mrs. Barreras notified as instructed by telephone.  Appointment scheduled for 02/03/2021 at 10:20 am with Dr. Diona Browner.

## 2021-01-20 NOTE — Telephone Encounter (Signed)
Left message for Jessica Hardin to return my call.

## 2021-01-20 NOTE — Telephone Encounter (Signed)
Have her monitor her BP over next 2 weeks... call if  BP persistently > 140/90.  Make follow up appt 2 weeks.

## 2021-02-03 ENCOUNTER — Other Ambulatory Visit: Payer: Self-pay

## 2021-02-03 ENCOUNTER — Ambulatory Visit (INDEPENDENT_AMBULATORY_CARE_PROVIDER_SITE_OTHER): Payer: PPO | Admitting: Family Medicine

## 2021-02-03 VITALS — BP 134/70 | HR 62 | Temp 97.9°F | Ht 58.25 in | Wt 131.0 lb

## 2021-02-03 DIAGNOSIS — I10 Essential (primary) hypertension: Secondary | ICD-10-CM | POA: Diagnosis not present

## 2021-02-03 NOTE — Progress Notes (Signed)
Patient ID: Jessica Hardin, female    DOB: [birth year], 80 y.o.   MRN: [id number]  This visit was conducted in person.  BP 134/70   Pulse 62   Temp 97.9 F (36.6 C) (Temporal)   Ht 4' 10.25" (1.48 m)   Wt 131 lb (59.4 kg)   SpO2 97%   BMI 27.14 kg/m    CC: Chief Complaint  Patient presents with   Follow-up    ER Visit for elevated blood pressure    Subjective:   HPI: Jessica Hardin is a 80 y.o. female presenting on 02/03/2021 for Follow-up (ER Visit for elevated blood pressure)  Reviewed ER visit from 01/18/2021 She had noted trending up BPs    In ER BP was 155/93 Initial troponin slightly high then trending down ( no chest pain), bmet and cbc  EKG reviewed:normal sinus, no ST elevation Head CT: Generalized cerebral atrophy. 2. No acute intracranial abnormality. She was told to increase  amlodipine 5 mg.. took for for a few days, now back to 2.5 mg daily.  Today she reports no headache, no dizziness. No chest pain, NO SOB.  BP Readings from Last 3 Encounters:  02/03/21 134/70  01/18/21 (!) 179/86  12/31/20 138/82     Relevant past medical, surgical, family and social history reviewed and updated as indicated. Interim medical history since our last visit reviewed. Allergies and medications reviewed and updated. Outpatient Medications Prior to Visit  Medication Sig Dispense Refill   amLODipine (NORVASC) 2.5 MG tablet Take 1 tablet (2.5 mg total) by mouth daily. 90 tablet 3   atorvastatin (LIPITOR) 10 MG tablet Take 1 tablet (10 mg total) by mouth daily. 30 tablet 11   Azelastine HCl 0.15 % SOLN Place 2 sprays into both nostrils daily.     donepezil (ARICEPT) 5 MG tablet Take 5 mg by mouth at bedtime.     hydrOXYzine (ATARAX/VISTARIL) 10 MG tablet TAKE 1 TABLET BY MOUTH THREE TIMES DAILY AS NEEDED FOR ANXIETY 30 tablet 5   latanoprost (XALATAN) 0.005 % ophthalmic solution Place 1 drop into both eyes at bedtime.     meloxicam (MOBIC) 7.5 MG tablet Take 1 tablet (7.5  mg total) by mouth daily as needed for pain. 30 tablet 0   mirtazapine (REMERON) 15 MG tablet Take 15 mg by mouth at bedtime.     Multiple Vitamin (MULTIVITAMIN) capsule Take 1 capsule by mouth daily.      Olopatadine HCl 0.6 % SOLN Place 1-2 sprays into the nose in the morning and at bedtime.     traZODone (DESYREL) 50 MG tablet Take 25-50 mg by mouth at bedtime as needed.     venlafaxine XR (EFFEXOR-XR) 75 MG 24 hr capsule Take 75 mg by mouth every morning.     No facility-administered medications prior to visit.     Per HPI unless specifically indicated in ROS section below Review of Systems  Constitutional:  Negative for fatigue and fever.  HENT:  Negative for ear pain.   Eyes:  Negative for pain.  Respiratory:  Negative for chest tightness and shortness of breath.   Cardiovascular:  Negative for chest pain, palpitations and leg swelling.  Gastrointestinal:  Negative for abdominal pain.  Genitourinary:  Negative for dysuria.  Objective:  BP 134/70   Pulse 62   Temp 97.9 F (36.6 C) (Temporal)   Ht 4' 10.25" (1.48 m)   Wt 131 lb (59.4 kg)   SpO2 97%   BMI 27.14 kg/m  Wt Readings from Last 3 Encounters:  02/03/21 131 lb (59.4 kg)  01/18/21 135 lb (61.2 kg)  12/31/20 131 lb (59.4 kg)      Physical Exam Constitutional:      General: She is not in acute distress.    Appearance: Normal appearance. She is well-developed. She is not ill-appearing or toxic-appearing.  HENT:     Head: Normocephalic.     Right Ear: Hearing, tympanic membrane, ear canal and external ear normal. Tympanic membrane is not erythematous, retracted or bulging.     Left Ear: Hearing, tympanic membrane, ear canal and external ear normal. Tympanic membrane is not erythematous, retracted or bulging.     Nose: No mucosal edema or rhinorrhea.     Right Sinus: No maxillary sinus tenderness or frontal sinus tenderness.     Left Sinus: No maxillary sinus tenderness or frontal sinus tenderness.      Mouth/Throat:     Pharynx: Uvula midline.  Eyes:     General: Lids are normal. Lids are everted, no foreign bodies appreciated.     Conjunctiva/sclera: Conjunctivae normal.     Pupils: Pupils are equal, round, and reactive to light.  Neck:     Thyroid: No thyroid mass or thyromegaly.     Vascular: No carotid bruit.     Trachea: Trachea normal.  Cardiovascular:     Rate and Rhythm: Normal rate and regular rhythm.     Pulses: Normal pulses.     Heart sounds: Normal heart sounds, S1 normal and S2 normal. No murmur heard.   No friction rub. No gallop.  Pulmonary:     Effort: Pulmonary effort is normal. No tachypnea or respiratory distress.     Breath sounds: Normal breath sounds. No decreased breath sounds, wheezing, rhonchi or rales.  Abdominal:     General: Bowel sounds are normal.     Palpations: Abdomen is soft.     Tenderness: There is no abdominal tenderness.  Musculoskeletal:     Cervical back: Normal range of motion and neck supple.  Skin:    General: Skin is warm and dry.     Findings: No rash.  Neurological:     Mental Status: She is alert.  Psychiatric:        Mood and Affect: Mood is not anxious or depressed.        Speech: Speech normal.        Behavior: Behavior normal. Behavior is cooperative.        Thought Content: Thought content normal.        Judgment: Judgment normal.      Results for orders placed or performed during the hospital encounter of 01/18/21  CBC  Result Value Ref Range   WBC 4.7 4.0 - 10.5 K/uL   RBC 4.13 3.87 - 5.11 MIL/uL   Hemoglobin 13.1 12.0 - 15.0 g/dL   HCT 39.9 36.0 - 46.0 %   MCV 96.6 80.0 - 100.0 fL   MCH 31.7 26.0 - 34.0 pg   MCHC 32.8 30.0 - 36.0 g/dL   RDW 12.9 11.5 - 15.5 %   Platelets 180 150 - 400 K/uL   nRBC 0.0 0.0 - 0.2 %  Basic metabolic panel  Result Value Ref Range   Sodium 140 135 - 145 mmol/L   Potassium 3.6 3.5 - 5.1 mmol/L   Chloride 104 98 - 111 mmol/L   CO2 31 22 - 32 mmol/L   Glucose, Bld 91 70 - 99  mg/dL   BUN  12 8 - 23 mg/dL   Creatinine, Ser 0.84 0.44 - 1.00 mg/dL   Calcium 9.5 8.9 - 10.3 mg/dL   GFR, Estimated >60 >60 mL/min   Anion gap 5 5 - 15  Troponin I (High Sensitivity)  Result Value Ref Range   Troponin I (High Sensitivity) 18 (H) <18 ng/L  Troponin I (High Sensitivity)  Result Value Ref Range   Troponin I (High Sensitivity) 17 <18 ng/L    This visit occurred during the SARS-CoV-2 public health emergency.  Safety protocols were in place, including screening questions prior to the visit, additional usage of staff PPE, and extensive cleaning of exam room while observing appropriate contact time as indicated for disinfecting solutions.   COVID 19 screen:  No recent travel or known exposure to COVID19 The patient denies respiratory symptoms of COVID 19 at this time. The importance of social distancing was discussed today.   Assessment and Plan  Problem List Items Addressed This Visit     Essential hypertension - Primary    Continue amlodipine 2.5 mg daily ( one tablet) for now.  If BP spikes > 150/90 for several days.. can increase to 5 mg daily ( 2 tablets)        Eliezer Lofts, MD

## 2021-02-03 NOTE — Patient Instructions (Addendum)
Continue amlodipine 2.5 mg daily ( one tablet) for now.  If BP spikes > 150/90 for several days.. can increase to 5 mg daily ( 2 tablets).

## 2021-02-03 NOTE — Assessment & Plan Note (Signed)
Continue amlodipine 2.5 mg daily ( one tablet) for now.  If BP spikes > 150/90 for several days.. can increase to 5 mg daily ( 2 tablets)

## 2021-02-15 ENCOUNTER — Telehealth: Payer: Self-pay

## 2021-02-15 ENCOUNTER — Telehealth: Payer: Self-pay | Admitting: Family Medicine

## 2021-02-15 NOTE — Telephone Encounter (Signed)
Please see other phone note 02/15/21.

## 2021-02-15 NOTE — Telephone Encounter (Signed)
Pt left 2 voice messages between 12:47 and 12:52. Pt wants to go back on amlodipine 5 mg daily. Pt v/m has that BP usually 120-70; BP today 147/81 and once was 135 / Something. Per pts vitals flow sheet systolic has not been [id number] since 12/16/19. Pt said she has felt lightheaded on and off. Per 02/03/21 visit pt to continue 2.5 mg (one tab po daily. ) if pts BP is consistently > 150/90 for several days can increase amlodipine to 5 mg (two tabs po daily). Per note pt is to be recked in 5 months. Pt wants to know if she has to wait to go on amlodipine 5 mg daily. Pt request cb. [facility] [address].sending note to DR Diona Browner and Butch Penny CMA and sending teams to [place].

## 2021-02-15 NOTE — Telephone Encounter (Signed)
Pt   is here in lobby no appt. Scheduled but she is stating she been dizzy latelt and would like to double up on her blood pressure meds.([place])Amlodipine

## 2021-02-15 NOTE — Telephone Encounter (Signed)
Borderline elevation. Okay to have her take 2 tabs of amlodipine 2.5 mg daily for a total of 5 mg daily... call with BP measurements once daily over the rest of the week.

## 2021-02-16 ENCOUNTER — Telehealth: Payer: Self-pay

## 2021-02-16 MED ORDER — AMLODIPINE BESYLATE 2.5 MG PO TABS
5.0000 mg | ORAL_TABLET | Freq: Every day | ORAL | 1 refills | Status: DC
Start: 2021-02-16 — End: 2021-07-21

## 2021-02-16 NOTE — Telephone Encounter (Signed)
Ms. Rohlik notified as instructed by telephone.  Patient states understanding.  Refill sent to [facility] as requested.

## 2021-02-16 NOTE — Telephone Encounter (Signed)
Dizziness and confusion. Pt said her children will be there at [place] and she will see if one of them will take pt to ED. Pt already has appt with Dr Diona Browner on 02/17/21 at 10:40. Sending note to DR Diona Browner and Butch Penny CMA. Will send teams to Butch Penny also.

## 2021-02-16 NOTE — Telephone Encounter (Signed)
Jessica Hardin - Jessica TELEPHONE ADVICE RECORD [facility] Patient Name: Jessica Hardin Gender: Female DOB: 1941-02-21 Age: 80 Y 31 M 9 D Return Phone Number: Address: City/ State/ Zip: [place] [address] Jessica Hardin - Jessica Jessica Site [place] [facility] Physician Eliezer Lofts - MD Contact Type Call Who Is Calling Patient / Member / Family / Caregiver Call Type Triage / Clinical Relationship To Patient Self Return Phone Number Please choose phone number Chief Complaint CONFUSION - new onset Reason for Call Symptomatic / Request for Health Information Initial Comment Caller states she has Sx of dizziness, confusion. Jessica [place] Translation No Nurse Assessment Nurse: Hassell Done, RN, Threasa Beards Date/Time Eilene Ghazi Time): 02/16/2021 3:07:18 PM Confirm and document reason for call. If symptomatic, describe symptoms. ---Caller states she has dizziness and confusion. Caller feels as if she is out of it. Does the patient have any new Jessica worsening symptoms? ---Yes Will a triage be completed? ---Yes Related visit to physician within the last 2 weeks? ---No Does the PT have any chronic conditions? (i.e. diabetes, asthma, this includes High risk factors for pregnancy, etc.) ---No Is this a behavioral health Jessica substance abuse call? ---No Guidelines Guideline Title Affirmed Question Affirmed Notes Nurse Date/Time (Eastern Time) Confusion - Delirium Patient sounds very sick Jessica weak to the triager Hassell Done, RN, [facility] 02/16/2021 3:09:35 PM Disp. Time Eilene Ghazi Time) Disposition Final User 02/16/2021 3:05:23 PM Send to Urgent Mellody Dance 02/16/2021 3:14:14 PM Go to ED Now (Jessica PCP triage) Yes Hassell Done, RN, Donnajean Lopes Disagree/Comply Comply PLEASE NOTE: All timestamps contained within this report are represented as [place] Standard Time. CONFIDENTIALTY NOTICE:  This fax transmission is intended only for the addressee. It contains information that is legally privileged, confidential Jessica otherwise protected from use Jessica disclosure. If you are not the intended recipient, you are strictly prohibited from reviewing, disclosing, copying using Jessica disseminating any of this information Jessica taking any action in reliance on Jessica regarding this information. If you have received this fax in error, please notify us immediately by telephone so that we can arrange for its return to [place]. Phone: [phone], Toll-Free: [phone], Fax: [phone] Page: 2 of 2 Call Id: [id number] Kerman Understands Yes PreDisposition Call Doctor Care Advice Given Per Guideline GO TO ED NOW (Jessica PCP TRIAGE): * IF NO PCP (PRIMARY CARE PROVIDER) SECOND-LEVEL TRIAGE: You need to be seen within the next hour. Go to the [facility] at _____________ [place] as soon as you can. ANOTHER ADULT SHOULD DRIVE: * It is better and safer if another adult drives instead of you. CARE ADVICE given per Confusion-Delirium (Adult) guideline. Referrals GO TO FACILITY OTHER - SPECIFY

## 2021-02-16 NOTE — Telephone Encounter (Signed)
Pts husband (DPR signed) said that he saw on computer where pt had appt with Dr Diona Browner on 02/17/21 but did not see the time. I advised 10:40 AM.but pt was advised by access nurse with new symptom of confusion and pt stating she did not feel well along with dizziness pt was going to see if child could take pt to ED today when child gets to pts home around 5 PM. Mr Foyle voiced understanding and will see what they can do. Sending note to DR Diona Browner and Butch Penny CMA.

## 2021-02-17 ENCOUNTER — Ambulatory Visit (INDEPENDENT_AMBULATORY_CARE_PROVIDER_SITE_OTHER): Payer: PPO | Admitting: Family Medicine

## 2021-02-17 ENCOUNTER — Encounter: Payer: Self-pay | Admitting: Family Medicine

## 2021-02-17 ENCOUNTER — Other Ambulatory Visit: Payer: Self-pay

## 2021-02-17 VITALS — BP 142/80 | HR 72 | Temp 97.6°F | Ht 58.25 in | Wt 131.0 lb

## 2021-02-17 DIAGNOSIS — R42 Dizziness and giddiness: Secondary | ICD-10-CM

## 2021-02-17 DIAGNOSIS — I10 Essential (primary) hypertension: Secondary | ICD-10-CM | POA: Diagnosis not present

## 2021-02-17 NOTE — Progress Notes (Signed)
Patient ID: Jessica Hardin, female    DOB: [birth year], 80 y.o.   MRN: [id number]  This visit was conducted in person.  BP (!) 142/80 Comment: She has not taken her bp medication yet  Pulse 72   Temp 97.6 F (36.4 C) (Temporal)   Ht 4' 10.25" (1.48 m)   Wt 131 lb (59.4 kg)   SpO2 97%   BMI 27.14 kg/m    CC: Chief Complaint  Patient presents with   Dizziness    Subjective:   HPI: Jessica Hardin is a 80 y.o. female presenting on 02/17/2021 for Dizziness  She has has been having worsening dizziness.Marland Kitchen  describes as feeling off balance,swaying from side to side with walking. No room spinning.  No orthostatic component.  Onging x few months.  Trigger with tuning and moving, moving head worsening.  No associated SOB, headache, no new neuro symptoms.   Gradually worsening.  She has not taken BP med yet today. BP Readings from Last 3 Encounters:  02/17/21 (!) 142/80  02/03/21 134/70  01/18/21 (!) 179/86     Allergies well controlled.  No new meds , no change in medications.  8/2 nml cbc, BMET nml     Relevant past medical, surgical, family and social history reviewed and updated as indicated. Interim medical history since our last visit reviewed. Allergies and medications reviewed and updated. Outpatient Medications Prior to Visit  Medication Sig Dispense Refill   amLODipine (NORVASC) 2.5 MG tablet Take 2 tablets (5 mg total) by mouth daily. 180 tablet 1   atorvastatin (LIPITOR) 10 MG tablet Take 1 tablet (10 mg total) by mouth daily. 30 tablet 11   Azelastine HCl 0.15 % SOLN Place 2 sprays into both nostrils daily.     donepezil (ARICEPT) 5 MG tablet Take 5 mg by mouth at bedtime.     hydrOXYzine (ATARAX/VISTARIL) 10 MG tablet TAKE 1 TABLET BY MOUTH THREE TIMES DAILY AS NEEDED FOR ANXIETY 30 tablet 5   latanoprost (XALATAN) 0.005 % ophthalmic solution Place 1 drop into both eyes at bedtime.     meloxicam (MOBIC) 7.5 MG tablet Take 1 tablet (7.5 mg total) by mouth  daily as needed for pain. 30 tablet 0   mirtazapine (REMERON) 15 MG tablet Take 15 mg by mouth at bedtime.     Multiple Vitamin (MULTIVITAMIN) capsule Take 1 capsule by mouth daily.      Olopatadine HCl 0.6 % SOLN Place 1-2 sprays into the nose in the morning and at bedtime.     traZODone (DESYREL) 50 MG tablet Take 25-50 mg by mouth at bedtime as needed.     venlafaxine XR (EFFEXOR-XR) 75 MG 24 hr capsule Take 75 mg by mouth every morning.     No facility-administered medications prior to visit.     Per HPI unless specifically indicated in ROS section below Review of Systems  Constitutional:  Negative for fatigue and fever.  HENT:  Negative for ear pain.   Eyes:  Negative for pain.  Respiratory:  Negative for chest tightness and shortness of breath.   Cardiovascular:  Negative for chest pain, palpitations and leg swelling.  Gastrointestinal:  Negative for abdominal pain.  Genitourinary:  Negative for dysuria.  Objective:  BP (!) 142/80 Comment: She has not taken her bp medication yet  Pulse 72   Temp 97.6 F (36.4 C) (Temporal)   Ht 4' 10.25" (1.48 m)   Wt 131 lb (59.4 kg)   SpO2 97%  BMI 27.14 kg/m   Wt Readings from Last 3 Encounters:  02/17/21 131 lb (59.4 kg)  02/03/21 131 lb (59.4 kg)  01/18/21 135 lb (61.2 kg)      Physical Exam    Results for orders placed or performed during the hospital encounter of 01/18/21  CBC  Result Value Ref Range   WBC 4.7 4.0 - 10.5 K/uL   RBC 4.13 3.87 - 5.11 MIL/uL   Hemoglobin 13.1 12.0 - 15.0 g/dL   HCT 39.9 36.0 - 46.0 %   MCV 96.6 80.0 - 100.0 fL   MCH 31.7 26.0 - 34.0 pg   MCHC 32.8 30.0 - 36.0 g/dL   RDW 12.9 11.5 - 15.5 %   Platelets 180 150 - 400 K/uL   nRBC 0.0 0.0 - 0.2 %  Basic metabolic panel  Result Value Ref Range   Sodium 140 135 - 145 mmol/L   Potassium 3.6 3.5 - 5.1 mmol/L   Chloride 104 98 - 111 mmol/L   CO2 31 22 - 32 mmol/L   Glucose, Bld 91 70 - 99 mg/dL   BUN 12 8 - 23 mg/dL   Creatinine, Ser 0.84  0.44 - 1.00 mg/dL   Calcium 9.5 8.9 - 10.3 mg/dL   GFR, Estimated >60 >60 mL/min   Anion gap 5 5 - 15  Troponin I (High Sensitivity)  Result Value Ref Range   Troponin I (High Sensitivity) 18 (H) <18 ng/L  Troponin I (High Sensitivity)  Result Value Ref Range   Troponin I (High Sensitivity) 17 <18 ng/L    This visit occurred during the SARS-CoV-2 public health emergency.  Safety protocols were in place, including screening questions prior to the visit, additional usage of staff PPE, and extensive cleaning of exam room while observing appropriate contact time as indicated for disinfecting solutions.   COVID 19 screen:  No recent travel or known exposure to COVID19 The patient denies respiratory symptoms of COVID 19 at this time. The importance of social distancing was discussed today.   Assessment and Plan  Problem List Items Addressed This Visit     Dizziness - Primary    Acute, recurrent, BPPV vs secondary to poorly controlled HTN Start desensitization exercise and improve BP control.  Follow up prn.      Essential hypertension    Inadequate control, chronic  Take 2 tabs of 2.5 mg amlodipine.. check blood pressure daily and call in 1 week with measurements.           Eliezer Lofts, MD

## 2021-02-17 NOTE — Patient Instructions (Addendum)
Take 2 tabs of 2.5 mg amlodipine.. check blood pressure daily and call in 1 week with measurements.  Start desensitization exercise.

## 2021-03-01 DIAGNOSIS — M25561 Pain in right knee: Secondary | ICD-10-CM | POA: Insufficient documentation

## 2021-03-01 DIAGNOSIS — M25562 Pain in left knee: Secondary | ICD-10-CM | POA: Insufficient documentation

## 2021-03-01 NOTE — Assessment & Plan Note (Signed)
Can use over the counter Voltaren gel  up to four times daily on knees for pain.

## 2021-03-01 NOTE — Assessment & Plan Note (Signed)
   Alzheimer's disease followed by neurology, Dr. Melrose Nakayama: On donezepil

## 2021-03-01 NOTE — Assessment & Plan Note (Signed)
Inadequate control Work decreasing animal fat in det.. eating more lean meats, less fried foods, fatty.  Start atorvastatin 10 mg daily to lower bad cholesterol and decrease risk of heart attack and stroke.

## 2021-03-07 DIAGNOSIS — F411 Generalized anxiety disorder: Secondary | ICD-10-CM | POA: Diagnosis not present

## 2021-03-07 DIAGNOSIS — F322 Major depressive disorder, single episode, severe without psychotic features: Secondary | ICD-10-CM | POA: Diagnosis not present

## 2021-03-07 DIAGNOSIS — F5105 Insomnia due to other mental disorder: Secondary | ICD-10-CM | POA: Diagnosis not present

## 2021-03-21 NOTE — Assessment & Plan Note (Signed)
Inadequate control, chronic  Take 2 tabs of 2.5 mg amlodipine.. check blood pressure daily and call in 1 week with measurements.

## 2021-03-21 NOTE — Assessment & Plan Note (Addendum)
Acute, recurrent, BPPV vs secondary to poorly controlled HTN Start desensitization exercise and improve BP control.  Follow up prn.

## 2021-03-23 ENCOUNTER — Telehealth: Payer: Self-pay | Admitting: Family Medicine

## 2021-03-23 DIAGNOSIS — I10 Essential (primary) hypertension: Secondary | ICD-10-CM

## 2021-03-23 NOTE — Telephone Encounter (Signed)
-----   Message from Ellamae Sia sent at 03/14/2021 10:55 AM EDT ----- Regarding: Lab orders for Tuesday, 10.18.22 Lab orders for a 2 month f/u appt,

## 2021-04-05 ENCOUNTER — Other Ambulatory Visit: Payer: PPO

## 2021-05-30 ENCOUNTER — Telehealth: Payer: Self-pay | Admitting: Family Medicine

## 2021-05-30 NOTE — Chronic Care Management (AMB) (Signed)
  Chronic Care Management   Note  05/30/2021 Name: [name] MRN: [id number] DOB: 21-Feb-1941  Jessica Hardin is a 80 y.o. year old female who is a primary care patient of Bedsole, Amy E, MD. I reached out to Jessica Hardin by phone today in response to a referral sent by Jessica Hardin's PCP, Jinny Sanders, MD.   Ms. Srinivasan was given information about Chronic Care Management services today including:  CCM service includes personalized support from designated clinical staff supervised by her physician, including individualized plan of care and coordination with other care providers 24/7 contact phone numbers for assistance for urgent and routine care needs. Service will only be billed when office clinical staff spend 20 minutes or more in a month to coordinate care. Only one practitioner may furnish and bill the service in a calendar month. The patient may stop CCM services at any time (effective at the end of the month) by phone call to the office staff.   Patient agreed to services and verbal consent obtained.   Follow up plan:   Tatjana Secretary/administrator

## 2021-06-08 ENCOUNTER — Other Ambulatory Visit: Payer: Self-pay | Admitting: Family Medicine

## 2021-06-08 DIAGNOSIS — Z1231 Encounter for screening mammogram for malignant neoplasm of breast: Secondary | ICD-10-CM

## 2021-07-01 DIAGNOSIS — F411 Generalized anxiety disorder: Secondary | ICD-10-CM | POA: Diagnosis not present

## 2021-07-01 DIAGNOSIS — F5105 Insomnia due to other mental disorder: Secondary | ICD-10-CM | POA: Diagnosis not present

## 2021-07-01 DIAGNOSIS — F322 Major depressive disorder, single episode, severe without psychotic features: Secondary | ICD-10-CM | POA: Diagnosis not present

## 2021-07-04 DIAGNOSIS — H40003 Preglaucoma, unspecified, bilateral: Secondary | ICD-10-CM | POA: Diagnosis not present

## 2021-07-05 ENCOUNTER — Telehealth: Payer: Self-pay

## 2021-07-05 NOTE — Progress Notes (Signed)
Chronic Care Management Pharmacy Assistant   Name: Jessica Hardin  MRN: [id number] DOB: [birth year]  Reason for Encounter: CCM (Initial Questions)   Recent office visits:  02/17/2021 - Eliezer Lofts, MD - Patient presented for dizziness. Change due to inadequate control of blood pressure - Amlodipine 2.5 - take 2 tabs daily, check blood pressure daily. Patient is to call in 1 week with readings.  02/03/2021 - Eliezer Lofts, MD - Patient presented for hypertension. If BP spikes > 150/90 for several days.. can increase to 5 mg daily ( 2 tablets). No other medication changes.   Recent consult visits:  03/07/2021 - [facility] - Psychiatry - Patient presented for major depressive disorder, anxiety and insomnia due to other mental disorder. No other information.  01/18/2021 - [facility] - Patient presented for high blood pressure and headache for the last few days. Patient was referred to ED. 01/03/2021 - [facility] - Ophthalmology - Patient presented for presence of intraocular lens. No other information.   Hospital visits:  Medication Reconciliation was completed by comparing discharge summary, patients EMR and Pharmacy list, and upon discussion with patient.  Admitted to the hospital on 01/18/2021 due to elevated blood pressure. Discharge date was 01/19/2021. Discharged from [facility].    Final Diagnoses: Hypertension  Medications that remain the same after Hospital Discharge:??  -All other medications will remain the same.    Medications: Outpatient Encounter Medications as of 07/05/2021  Medication Sig   amLODipine (NORVASC) 2.5 MG tablet Take 2 tablets (5 mg total) by mouth daily.   atorvastatin (LIPITOR) 10 MG tablet Take 1 tablet (10 mg total) by mouth daily.   Azelastine HCl 0.15 % SOLN Place 2 sprays into both nostrils daily.   donepezil (ARICEPT) 5 MG tablet Take 5 mg by mouth at bedtime.   hydrOXYzine (ATARAX/VISTARIL) 10 MG tablet TAKE 1  TABLET BY MOUTH THREE TIMES DAILY AS NEEDED FOR ANXIETY   latanoprost (XALATAN) 0.005 % ophthalmic solution Place 1 drop into both eyes at bedtime.   meloxicam (MOBIC) 7.5 MG tablet Take 1 tablet (7.5 mg total) by mouth daily as needed for pain.   mirtazapine (REMERON) 15 MG tablet Take 15 mg by mouth at bedtime.   Multiple Vitamin (MULTIVITAMIN) capsule Take 1 capsule by mouth daily.    Olopatadine HCl 0.6 % SOLN Place 1-2 sprays into the nose in the morning and at bedtime.   traZODone (DESYREL) 50 MG tablet Take 25-50 mg by mouth at bedtime as needed.   venlafaxine XR (EFFEXOR-XR) 75 MG 24 hr capsule Take 75 mg by mouth every morning.   No facility-administered encounter medications on file as of 07/05/2021.   No results found for: HGBA1C, MICROALBUR   BP Readings from Last 3 Encounters:  02/17/21 (!) 142/80  02/03/21 134/70  01/18/21 (!) 179/86   Patient contacted to review initial questions prior to visit with Charlene Brooke.  Have you seen any other providers since your last visit with PCP? Yes  Any changes in your medications or health? No  Any side effects from any medications? No  Do you have an symptoms or problems not managed by your medications? No  Any concerns about your health right now? No  Has your provider asked that you check blood pressure, blood sugar, or follow special diet at home? No   Do you get any type of exercise on a regular basis? No  Can you think of a goal you would like to  reach for your health? Yes Patient does not like taking so many medications. She would like to get off of some of them.   Do you have any problems getting your medications? No  Is there anything that you would like to discuss during the appointment? Yes - Patient does not like taking so many medications. She would like to get off of some of them.   Spoke with patient and reminded them to have all medications, supplements and any blood glucose and blood pressure readings  available for review with pharmacist, at their telephone visit on 07/12/2021 at 11:00.    Star Rating Drugs:  Medication:  Last Fill: Day Supply Atorvastatin 10 mg No fill date noted   Care Gaps: Annual wellness visit in last year? No 12/16/2019 Most Recent BP reading: 142/80 on 02/17/2021  Charlene Brooke, CPP notified  Marijean Niemann, [place] Clinical Pharmacy Assistant [phone]  Time Spent: 40 Minutes

## 2021-07-07 ENCOUNTER — Ambulatory Visit: Payer: PPO | Admitting: Family Medicine

## 2021-07-11 DIAGNOSIS — H40003 Preglaucoma, unspecified, bilateral: Secondary | ICD-10-CM | POA: Diagnosis not present

## 2021-07-11 NOTE — Progress Notes (Signed)
Chronic Care Management Pharmacy Note  07/14/2021 Name:  [name] MRN:  [id number] DOB:  18-Jun-1941  Summary: -Pt not taking atorvastatin or donepezil, cannot say why -Pt appears confused with medications, she asked me to repeat names/purpose of medications multiple times -Pt feels depression medications are not helping; she follows regularly with psychiatrist Dr Maryruth Bun -Pt is due for repeat DEXA (last 2017 showed osteopenia)  Recommendations/Changes made from today's visit: -Advised to restart atorvastatin and donepezil as prescribed - to be delivered this week from pharmacy -Utilize [facility] for medication synchronization, packaging and delivery -Advised pt to contact Dr Maryruth Bun for earlier appt -Recommend repeat DEXA scan this year  Plan: -[facility] Concierge will call patient 1 month for adherence review -Pharmacist follow up televisit scheduled for 3 months -PCP annual visit due July 2023; not yet scheduled   Subjective: Jessica Hardin is an 81 y.o. year old female who is a primary patient of Bedsole, Amy E, MD.  The CCM team was consulted for assistance with disease management and care coordination needs.    Engaged with patient by telephone for initial visit in response to provider referral for pharmacy case management and/or care coordination services.   Consent to Services:  The patient was given the following information about Chronic Care Management services today, agreed to services, and gave verbal consent: 1. CCM service includes personalized support from designated clinical staff supervised by the primary care provider, including individualized plan of care and coordination with other care providers 2. 24/7 contact phone numbers for assistance for urgent and routine care needs. 3. Service will only be billed when office clinical staff spend 20 minutes or more in a month to coordinate care. 4. Only one practitioner may furnish and bill the service in a  calendar month. 5.The patient may stop CCM services at any time (effective at the end of the month) by phone call to the office staff. 6. The patient will be responsible for cost sharing (co-pay) of up to 20% of the service fee (after annual deductible is met). Patient agreed to services and consent obtained.  Patient Care Team: Excell Seltzer, MD as PCP - General (Family Medicine) Defrancesco, Prentice Docker, MD as Referring Physician (Obstetrics and Gynecology) Lemar Livings Merrily Pew, MD (General Surgery) Sallee Lange, MD as Referring Physician (Ophthalmology) Kathyrn Sheriff, [facility] as Pharmacist (Pharmacist)   Recent office visits: 02/17/2021 - Kerby Nora, MD - Patient presented for dizziness. Increase amlodipine to 2 tablets (5 mg total).  02/03/2021 - Kerby Nora, MD - Patient presented for hypertension. If BP spikes > 150/90 for several days.. can increase amlodipine to 5 mg daily ( 2 tablets). No other medication changes.   Recent consult visits: 03/07/2021 - Caryn Section - Psychiatry - Patient presented for major depressive disorder, anxiety and insomnia due to other mental disorder. No other information.  01/18/2021 - [facility] - Patient presented for high blood pressure and headache for the last few days. Patient was referred to ED. 01/03/2021 - Viviann Spare Ding[facility]dein - Ophthalmology - Patient presented for presence of intraocular lens. No other information.   Hospital visits: Medication Reconciliation was completed by comparing discharge summary, patients EMR and Pharmacy list, and upon discussion with patient.   Admitted to the hospital on 01/18/2021 due to Hypertension Discharge date was 01/19/2021. Discharged from [facility].   -Labs, CT reassuring. F/U with PCP   Medications that remain the same after Hospital Discharge:??  -All other medications will remain  the same.     Objective:  Lab Results  Component Value Date   CREATININE 0.84 01/18/2021    BUN 12 01/18/2021   GFR 51.67 (L) 12/24/2020   GFRNONAA >60 01/18/2021   GFRAA >60 02/04/2018   NA 140 01/18/2021   K 3.6 01/18/2021   CALCIUM 9.5 01/18/2021   CO2 31 01/18/2021   GLUCOSE 91 01/18/2021    Lab Results  Component Value Date/Time   GFR 51.67 (L) 12/24/2020 08:05 AM   GFR 67.84 08/30/2018 09:26 AM    Last diabetic Eye exam: No results found for: HMDIABEYEEXA  Last diabetic Foot exam: No results found for: HMDIABFOOTEX   Lab Results  Component Value Date   CHOL 221 (H) 12/24/2020   HDL 84.40 12/24/2020   LDLCALC 127 (H) 12/24/2020   TRIG 52.0 12/24/2020   CHOLHDL 3 12/24/2020    Hepatic Function Latest Ref Rng & Units 12/24/2020 08/30/2018 02/04/2018  Total Protein 6.0 - 8.3 g/dL 7.5 7.5 7.3  Albumin 3.5 - 5.2 g/dL 4.1 4.2 4.0  AST 0 - 37 U/L 25 25 23   ALT 0 - 35 U/L 18 17 14   Alk Phosphatase 39 - 117 U/L 74 79 61  Total Bilirubin 0.2 - 1.2 mg/dL 0.4 0.3 0.6    Lab Results  Component Value Date/Time   TSH 0.96 02/01/2017 12:53 PM   TSH 0.66 02/09/2015 10:26 AM   FREET4 0.71 02/01/2017 12:53 PM    CBC Latest Ref Rng & Units 01/18/2021 02/04/2018 10/27/2017  WBC 4.0 - 10.5 K/uL 4.7 4.2 5.0  Hemoglobin 12.0 - 15.0 g/dL 13.1 12.6 12.7  Hematocrit 36.0 - 46.0 % 39.9 38.1 38.1  Platelets 150 - 400 K/uL 180 175 197    Lab Results  Component Value Date/Time   VD25OH 36.74 08/30/2018 09:26 AM   VD25OH 32.44 02/01/2017 12:53 PM    Clinical ASCVD: No  The ASCVD Risk score (Arnett DK, et al., 2019) failed to calculate for the following reasons:   The 2019 ASCVD risk score is only valid for ages 30 to 75    Depression screen PHQ 2/9 12/31/2020 12/16/2019 09/16/2019  Decreased Interest 0 0 1  Down, Depressed, Hopeless 0 1 2  PHQ - 2 Score 0 1 3  Altered sleeping 0 2 2  Tired, decreased energy 1 1 2   Change in appetite 0 2 2  Feeling bad or failure about yourself  0 0 0  Trouble concentrating 1 2 3   Moving slowly or fidgety/restless 0 1 1  Suicidal thoughts  0 0 0  PHQ-9 Score 2 9 13   Difficult doing work/chores Not difficult at all Somewhat difficult Somewhat difficult  Some recent data might be hidden    GAD 7 : Generalized Anxiety Score 09/16/2019 08/19/2019 08/08/2019 05/22/2019  Nervous, Anxious, on Edge 3 3 2 2   Control/stop worrying 2 2 0 2  Worry too much - different things 2 2 1 2   Trouble relaxing 2 2 1 1   Restless 1 0 0 0  Easily annoyed or irritable 0 1 1 1   Afraid - awful might happen 0 1 1 1   Total GAD 7 Score 10 11 6 9   Anxiety Difficulty - Somewhat difficult Somewhat difficult Somewhat difficult    Social History   Tobacco Use  Smoking Status Never  Smokeless Tobacco Never   BP Readings from Last 3 Encounters:  02/17/21 (!) 142/80  02/03/21 134/70  01/18/21 (!) 179/86   Pulse Readings from Last 3 Encounters:  02/17/21 72  02/03/21 62  01/18/21 60   Wt Readings from Last 3 Encounters:  02/17/21 131 lb (59.4 kg)  02/03/21 131 lb (59.4 kg)  01/18/21 135 lb (61.2 kg)   BMI Readings from Last 3 Encounters:  02/17/21 27.14 kg/m  02/03/21 27.14 kg/m  01/18/21 27.27 kg/m    Assessment/Interventions: Review of patient past medical history, allergies, medications, health status, including review of consultants reports, laboratory and other test data, was performed as part of comprehensive evaluation and provision of chronic care management services.   SDOH:  (Social Determinants of Health) assessments and interventions performed: Yes SDOH Interventions    Flowsheet Row Most Recent Value  SDOH Interventions   Food Insecurity Interventions Intervention Not Indicated  Financial Strain Interventions Intervention Not Indicated      SDOH Screenings   Alcohol Screen: Not on file  Depression (PHQ2-9): Low Risk    PHQ-2 Score: 2  Financial Resource Strain: Low Risk    Difficulty of Paying Living Expenses: Not very hard  Food Insecurity: No Food Insecurity   Worried About Charity fundraiser in the Last Year:  Never true   Ran Out of Food in the Last Year: Never true  Housing: Not on file  Physical Activity: Not on file  Social Connections: Not on file  Stress: Not on file  Tobacco Use: Low Risk    Smoking Tobacco Use: Never   Smokeless Tobacco Use: Never   Passive Exposure: Not on file  Transportation Needs: Not on file    [place]  Allergies  Allergen Reactions   Shellfish Allergy Hives and Other (See Comments)    CHEST PAIN AND ITCHINESS (INTERNAL--MOUTH THRU GI TRACT)  WITH NAUSEA    Medications Reviewed Today     Reviewed by Charlton Haws, [place] (Pharmacist) on 07/14/21 at [place] List Status: <None>   Medication Order Taking? Sig Documenting Provider Last Dose Status Informant  amLODipine (NORVASC) 2.5 MG tablet [id number] Yes Take 2 tablets (5 mg total) by mouth daily. Jinny Sanders, MD Taking Active   atorvastatin (LIPITOR) 10 MG tablet [id number] No Take 1 tablet (10 mg total) by mouth daily.  Patient not taking: Reported on 07/14/2021   Jinny Sanders, MD Not Taking Active   donepezil (ARICEPT) 5 MG tablet [id number] No Take 5 mg by mouth at bedtime.  Patient not taking: Reported on 07/12/2021   [provider] Not Taking Active Multiple Informants  latanoprost (XALATAN) 0.005 % ophthalmic solution [id number] Yes Place 1 drop into both eyes at bedtime. [provider] Taking Active Multiple Informants  mirtazapine (REMERON) 15 MG tablet [id number] Yes Take 7.5 mg by mouth at bedtime. [provider] Taking Active Multiple Informants  Multiple Vitamin (MULTIVITAMIN) capsule [id number] No Take 1 capsule by mouth daily.   Patient not taking: Reported on 07/12/2021   [provider] Not Taking Active Multiple Informants           Med Note Delsa Sale, [place] Aug 24, 2020 10:16 AM)    traZODone (DESYREL) 50 MG tablet [id number] Yes Take 25-50 mg by mouth at bedtime as needed. [provider] Taking Active Multiple Informants   venlafaxine XR (EFFEXOR-XR) 75 MG 24 hr capsule [id number] Yes Take 75 mg by mouth every morning. [provider] Taking Active Multiple Informants            Patient Active Problem List   Diagnosis Date Noted   Bilateral knee pain  03/01/2021   Dizziness 02/17/2021   Hypercholesteremia 12/31/2020   Alzheimer disease ([place]) 09/10/2020   Acute pain of both shoulders 01/23/2020   Essential hypertension 05/14/2019   Arthralgia 01/21/2019   GAD (generalized anxiety disorder) 10/30/2017   MDD (major depressive disorder), single episode, mild ([place]) 10/30/2017   Chronic insomnia 10/30/2017   Allergic rhinitis 10/05/2016   Osteopenia 11/18/2015   Chronic constipation 07/20/2015   Internal hemorrhoid, bleeding 07/20/2015   Counseling regarding end of life decision making 09/08/2014   Gastroesophageal reflux disease without esophagitis 08/13/2014   Irritable bowel syndrome with constipation 08/13/2014   Hx of migraines 08/13/2014   Family history of colon cancer 08/13/2014   Family history of uterine cancer 08/13/2014   Family history of breast cancer 08/13/2014    Immunization History  Administered Date(s) Administered   PFIZER(Purple Top)SARS-COV-2 Vaccination 09/06/2019, 09/30/2019, 06/05/2020   PNEUMOCOCCAL CONJUGATE-20 12/31/2020    Conditions to be addressed/monitored:  Hypertension, Hyperlipidemia, Depression, Anxiety, and Osteopenia  Care Plan : [place]  Updates made by Charlton Haws, [place] since 07/14/2021 12:00 AM     Problem: Hypertension, Hyperlipidemia, Depression, Anxiety, and Osteopenia   Priority: High     Long-Range Goal: Disease mgmt   Start Date: 07/14/2021  Expected End Date: 07/14/2022  This Visit's Progress: On track  Priority: High  Note:   Current Barriers:  Unable to independently monitor therapeutic efficacy Does not adhere to prescribed medication regimen  Pharmacist Clinical Goal(s):  Patient will achieve  adherence to monitoring guidelines and medication adherence to achieve therapeutic efficacy adhere to prescribed medication regimen as evidenced by pill packs through collaboration with PharmD and provider.   Interventions: 1:1 collaboration with Jinny Sanders, MD regarding development and update of comprehensive plan of care as evidenced by provider attestation and co-signature Inter-disciplinary care team collaboration (see longitudinal plan of care) Comprehensive medication review performed; medication list updated in electronic medical record  Hypertension (BP goal <140/90) -Controlled - pt endorses compliance with medication and home BP is at goal -Current home BP readings: 135/69, P 76 -Current treatment: Amlodipine 2.5 mg - 2 tab daily - Appropriate, Effective, Safe, Accessible -Denies hypotensive/hypertensive symptoms -Educated on BP goals and benefits of medications for prevention of heart attack, stroke and kidney damage; -Counseled to monitor BP at home periodically, document, and provide log at future appointments -Recommended to continue current medication  Hyperlipidemia: (LDL goal < 100) -Not ideally controlled - pt is not taking atorvastatin (she cannot find bottle); she cannot say why and does not recognize the drug name -Current treatment: Atorvastatin 10 mg daily- Appropriate, Query Effective (not taking), Safe, Accessible -Medications previously tried: n/a  -Educated on Cholesterol goals; Benefits of statin for ASCVD risk reduction; -Recommended to restart atorvastatin 10 mg - to be delivered this week from pharmacy  Depression/Anxiety (Goal: manage symptoms) -Not ideally controlled - per patient she is not sure medication is helping; she still feels down, depressed; she does report she is sleeping fairly well -PHQ9: 2 (12/2020) - minimal depression -GAD7: 10 (08/2018) - moderate anxiety -Connected with Dr Nicolasa Ducking for mental health support -Current  treatment: Venlafaxine ER 75 mg daily - Appropriate, Query Effective, Safe, Accessible Mirtazapine 15 mg -1/2 tab HS - Appropriate, Query Effective, Safe, Accessible Trazodone 50 mg - 1/2 tab HS - Appropriate, Effective, Safe, Accessible Hydroxyzine 10 mg PRN - not taking, removed from list -Medications previously tried/failed: sertraline, citalopram -Educated on Benefits of medication for symptom control -Advised pt to contact Dr Waylan Boga  office for sooner appt  Alzheimer's Disease (Goal: slow progression) -Not ideally controlled - pt is not taking donepezil, she does not recognized the drug name; short term memory does appear affected during phone conversation - patient asked for same drug names/purposes multiple times in short succession -Current treatment  Donepezil 5 mg HS -Medications previously tried: none -Discussed benefits of donepezil for slowing memory decline  -Recommended to restart donepezil - med to be delivered this week from pharmacy  Osteopenia (Goal prevent fractures) -Not ideally controlled - pt is due for repeat DEXA scan; she is not taking calcium or vitamin D -Last DEXA Scan: 11/18/2015   T-Score femoral neck: -1.6  T-Score lumbar spine: -1.3  10-year probability of major osteoporotic fracture: 5.5%  10-year probability of hip fracture: 1.2% -Patient is not a candidate for pharmacologic treatment -Current treatment  None -Medications previously tried: n/a  -Recommend [phone] units of vitamin D daily. Recommend 1200 mg of calcium daily from dietary and supplemental sources. -Recommend repeat DEXA scan this year  Health Maintenance -Vaccine gaps: Flu, Covid booster, Shingrix, TDAP -Pt reports she had flu shot at [facility] in the fall -Current therapy:  Latanoprost 0.005% eye drops HS -Patient is satisfied with current therapy and denies issues -Recommended to continue current medication  Patient Goals/Self-Care Activities Patient will:  - take medications as  prescribed as evidenced by patient report and record review focus on medication adherence by pill packs       Medication Assistance: None required.  Patient affirms current coverage meets needs.  Compliance/Adherence/Medication fill history: Care Gaps: DEXA scan (due 11/17/20) -recommended repeat scan this year (PCP due July 2023)  Star-Rating Drugs: Atorvastatin - LF 12/31/20 x 30 ds; PDC 0%  Patient's preferred pharmacy is:  [facility] - [place], [place] - [address] [place] [address] Phone: [phone] Fax: [phone]  Uses pill box? No -   Pt endorses unclear compliance - pt needed med names repeated multiple times and did not have atorvastatin or donepezil bottles at all  We discussed: Benefits of medication synchronization, packaging and delivery as well as enhanced pharmacist oversight with [facility]. Patient decided to: Utilize [facility] pharmacy for medication synchronization, packaging and delivery  Care Plan and Follow Up Patient Decision:  Patient agrees to Care Plan and Follow-up.  Plan: Telephone follow up appointment with care management team member scheduled for:  3 months  Charlene Brooke, PharmD, BCACP Clinical Pharmacist [facility] [phone]

## 2021-07-12 ENCOUNTER — Other Ambulatory Visit: Payer: Self-pay

## 2021-07-12 ENCOUNTER — Ambulatory Visit (INDEPENDENT_AMBULATORY_CARE_PROVIDER_SITE_OTHER): Payer: PPO | Admitting: Pharmacist

## 2021-07-12 ENCOUNTER — Telehealth: Payer: Self-pay

## 2021-07-12 DIAGNOSIS — I10 Essential (primary) hypertension: Secondary | ICD-10-CM

## 2021-07-12 DIAGNOSIS — F320 Major depressive disorder, single episode, mild: Secondary | ICD-10-CM

## 2021-07-12 DIAGNOSIS — E7800 Pure hypercholesterolemia, unspecified: Secondary | ICD-10-CM

## 2021-07-12 DIAGNOSIS — M8580 Other specified disorders of bone density and structure, unspecified site: Secondary | ICD-10-CM

## 2021-07-12 DIAGNOSIS — F0280 Dementia in other diseases classified elsewhere without behavioral disturbance: Secondary | ICD-10-CM

## 2021-07-12 NOTE — Progress Notes (Signed)
° ° °  Chronic Care Management Pharmacy Assistant   Name: Jessica Hardin  MRN: [id number] DOB: 17-Apr-1941  Reason for Encounter: CCM ([facility] Onboarding)   Patient onboarding form for [facility] has been completed and uploaded. Prescriptions have been requested to be transferred from [facility].  Charlene Brooke, CPP notified  Marijean Niemann, [place] Clinical Pharmacy Assistant [phone]  Time Spent: 40 Minutes

## 2021-07-13 MED ORDER — ATORVASTATIN CALCIUM 10 MG PO TABS: 10.0000 mg | ORAL_TABLET | Freq: Every day | ORAL | 1 refills | Status: DC

## 2021-07-14 ENCOUNTER — Telehealth: Payer: Self-pay

## 2021-07-14 NOTE — Progress Notes (Signed)
° ° °  Chronic Care Management Pharmacy Assistant   Name: Jessica Hardin  MRN: [id number] DOB: February 26, 1941  Reason for Encounter: CCM (Trazodone and Donepezil - Dr. Ginger Carne)   Called Dr. Waylan Boga office to request refills for Trazodone and Donepezil be sent to [facility].  Charlene Brooke, CPP notified  Marijean Niemann, [place] Clinical Pharmacy Assistant [phone]  Time Spent: 10 Minutes

## 2021-07-14 NOTE — Patient Instructions (Addendum)
Visit Information  Phone number for Pharmacist: [phone]  Thank you for meeting with me to discuss your medications! I look forward to working with you to achieve your health care goals. Below is a summary of what we talked about during the visit:   Goals Addressed             This Visit's Progress    Manage My Medicine       Timeframe:  Long-Range Goal Priority:  High Start Date:      07/14/21                       Expected End Date:  07/14/22                     Follow Up Date April 2023   - call for medicine refill 2 or 3 days before it runs out - call if I am sick and can't take my medicine - keep a list of all the medicines I take; vitamins and herbals too -Utilize [facility] for medication synchronization, packaging and delivery    Why is this important?   These steps will help you keep on track with your medicines.   Notes:         Care Plan : [place]  Updates made by Charlton Haws, RPH since 07/14/2021 12:00 AM     Problem: Hypertension, Hyperlipidemia, Depression, Anxiety, and Osteopenia   Priority: High     Long-Range Goal: Disease mgmt   Start Date: 07/14/2021  Expected End Date: 07/14/2022  This Visit's Progress: On track  Priority: High  Note:   Current Barriers:  Unable to independently monitor therapeutic efficacy Does not adhere to prescribed medication regimen  Pharmacist Clinical Goal(s):  Patient will achieve adherence to monitoring guidelines and medication adherence to achieve therapeutic efficacy adhere to prescribed medication regimen as evidenced by pill packs through collaboration with PharmD and provider.   Interventions: 1:1 collaboration with Jinny Sanders, MD regarding development and update of comprehensive plan of care as evidenced by provider attestation and co-signature Inter-disciplinary care team collaboration (see longitudinal plan of care) Comprehensive medication review performed; medication  list updated in electronic medical record  Hypertension (BP goal <140/90) -Controlled - pt endorses compliance with medication and home BP is at goal -Current home BP readings: 135/69, P 76 -Current treatment: Amlodipine 2.5 mg - 2 tab daily - Appropriate, Effective, Safe, Accessible -Denies hypotensive/hypertensive symptoms -Educated on BP goals and benefits of medications for prevention of heart attack, stroke and kidney damage; -Counseled to monitor BP at home periodically, document, and provide log at future appointments -Recommended to continue current medication  Hyperlipidemia: (LDL goal < 100) -Not ideally controlled - pt is not taking atorvastatin (she cannot find bottle); she cannot say why and does not recognize the drug name -Current treatment: Atorvastatin 10 mg daily- Appropriate, Query Effective (not taking), Safe, Accessible -Medications previously tried: n/a  -Educated on Cholesterol goals; Benefits of statin for ASCVD risk reduction; -Recommended to restart atorvastatin 10 mg - to be delivered this week from pharmacy  Depression/Anxiety (Goal: manage symptoms) -Not ideally controlled - per patient she is not sure medication is helping; she still feels down, depressed; she does report she is sleeping fairly well -PHQ9: 2 (12/2020) - minimal depression -GAD7: 10 (08/2018) - moderate anxiety -Connected with Dr Nicolasa Ducking for mental health support -Current treatment: Venlafaxine ER 75 mg daily - Appropriate, Query Effective, Safe, Accessible  Mirtazapine 15 mg -1/2 tab HS - Appropriate, Query Effective, Safe, Accessible Trazodone 50 mg - 1/2 tab HS - Appropriate, Effective, Safe, Accessible Hydroxyzine 10 mg PRN - not taking, removed from list -Medications previously tried/failed: sertraline, citalopram -Educated on Benefits of medication for symptom control -Advised pt to contact Dr Waylan Boga office for sooner appt  Alzheimer's Disease (Goal: slow progression) -Not ideally  controlled - pt is not taking donepezil, she does not recognized the drug name; short term memory does appear affected during phone conversation - patient asked for same drug names/purposes multiple times in short succession -Current treatment  Donepezil 5 mg HS -Medications previously tried: none -Discussed benefits of donepezil for slowing memory decline  -Recommended to restart donepezil - med to be delivered this week from pharmacy  Osteopenia (Goal prevent fractures) -Not ideally controlled - pt is due for repeat DEXA scan; she is not taking calcium or vitamin D -Last DEXA Scan: 11/18/2015   T-Score femoral neck: -1.6  T-Score lumbar spine: -1.3  10-year probability of major osteoporotic fracture: 5.5%  10-year probability of hip fracture: 1.2% -Patient is not a candidate for pharmacologic treatment -Current treatment  None -Medications previously tried: n/a  -Recommend [phone] units of vitamin D daily. Recommend 1200 mg of calcium daily from dietary and supplemental sources. -Recommend repeat DEXA scan this year  Health Maintenance -Vaccine gaps: Flu, Covid booster, Shingrix, TDAP -Pt reports she had flu shot [facility] in the fall -Current therapy:  Latanoprost 0.005% eye drops HS -Patient is satisfied with current therapy and denies issues -Recommended to continue current medication  Patient Goals/Self-Care Activities Patient will:  - take medications as prescribed as evidenced by patient report and record review focus on medication adherence by pill packs       Ms. Brines was given information about Chronic Care Management services today including:  CCM service includes personalized support from designated clinical staff supervised by her physician, including individualized plan of care and coordination with other care providers 24/7 contact phone numbers for assistance for urgent and routine care needs. Standard insurance, coinsurance, copays and deductibles apply  for chronic care management only during months in which we provide at least 20 minutes of these services. Most insurances cover these services at 100%, however patients may be responsible for any copay, coinsurance and/or deductible if applicable. This service may help you avoid the need for more expensive face-to-face services. Only one practitioner may furnish and bill the service in a calendar month. The patient may stop CCM services at any time (effective at the end of the month) by phone call to the office staff.  Patient agreed to services and verbal consent obtained.   The patient verbalized understanding of instructions, educational materials, and care plan provided today and declined offer to receive copy of patient instructions, educational materials, and care plan.  Telephone follow up appointment with pharmacy team member scheduled for: 3 months  Charlene Brooke, PharmD, [facility] Clinical Pharmacist [facility] at [facility] [phone]

## 2021-07-19 DIAGNOSIS — E7800 Pure hypercholesterolemia, unspecified: Secondary | ICD-10-CM

## 2021-07-19 DIAGNOSIS — F0280 Dementia in other diseases classified elsewhere without behavioral disturbance: Secondary | ICD-10-CM | POA: Diagnosis not present

## 2021-07-19 DIAGNOSIS — G309 Alzheimer's disease, unspecified: Secondary | ICD-10-CM

## 2021-07-19 DIAGNOSIS — F320 Major depressive disorder, single episode, mild: Secondary | ICD-10-CM

## 2021-07-19 DIAGNOSIS — I10 Essential (primary) hypertension: Secondary | ICD-10-CM

## 2021-07-21 ENCOUNTER — Other Ambulatory Visit: Payer: Self-pay | Admitting: Family Medicine

## 2021-07-21 NOTE — Telephone Encounter (Signed)
Per CCM note in 06/2021 -Current home BP readings: 135/69, P 76  Continue current medications.  Refills sent.

## 2021-07-21 NOTE — Telephone Encounter (Signed)
Last office visit 02/17/2021 for dizziness and HTN.  Last refilled 02/16/2021 for #180 with no refills.   AVS States:   Return in about 2 weeks (around 03/03/2021) for  follow up dizziness and HTN..  Take 2 tabs of 2.5 mg amlodipine.. check blood pressure daily and call in 1 week with measurements.    I don't see where patient called with BP readings and no future appointments with PCP.  Refill?

## 2021-07-25 ENCOUNTER — Telehealth: Payer: Self-pay | Admitting: Family Medicine

## 2021-07-25 NOTE — Telephone Encounter (Signed)
Pt and her daughter called wanting to know if it would be okay for her mother to start taking Ageless Brain  Pt said that it was okay to call her daughter back

## 2021-07-26 NOTE — Telephone Encounter (Signed)
Jessica Hardin (daughter) notified by telephone that it is okay for Jessica Hardin to take Ageless Brain per Dr. Diona Browner.

## 2021-07-26 NOTE — Telephone Encounter (Signed)
Okay to use.

## 2021-08-11 ENCOUNTER — Emergency Department: Payer: PPO

## 2021-08-11 ENCOUNTER — Emergency Department
Admission: EM | Admit: 2021-08-11 | Discharge: 2021-08-11 | Disposition: A | Discharge status: Home / Self Care | Payer: PPO | Attending: Emergency Medicine | Admitting: Emergency Medicine

## 2021-08-11 ENCOUNTER — Other Ambulatory Visit: Payer: Self-pay

## 2021-08-11 ENCOUNTER — Telehealth: Payer: Self-pay

## 2021-08-11 DIAGNOSIS — R002 Palpitations: Secondary | ICD-10-CM | POA: Diagnosis not present

## 2021-08-11 DIAGNOSIS — I10 Essential (primary) hypertension: Secondary | ICD-10-CM | POA: Insufficient documentation

## 2021-08-11 DIAGNOSIS — I517 Cardiomegaly: Secondary | ICD-10-CM | POA: Diagnosis not present

## 2021-08-11 LAB — BASIC METABOLIC PANEL
Anion gap: 9 (ref 5–15)
BUN: 12 mg/dL (ref 8–23)
CO2: 27 mmol/L (ref 22–32)
Calcium: 9.2 mg/dL (ref 8.9–10.3)
Chloride: 105 mmol/L (ref 98–111)
Creatinine, Ser: 0.98 mg/dL (ref 0.44–1.00)
GFR, Estimated: 58 mL/min — ABNORMAL LOW (ref >60)
Glucose, Bld: 116 mg/dL — ABNORMAL HIGH (ref 70–99)
Potassium: 3.8 mmol/L (ref 3.5–5.1)
Sodium: 141 mmol/L (ref 135–145)

## 2021-08-11 LAB — CBC
HCT: 39.6 % (ref 36.0–46.0)
Hemoglobin: 12.3 g/dL (ref 12.0–15.0)
MCH: 29.2 pg (ref 26.0–34.0)
MCHC: 31.1 g/dL (ref 30.0–36.0)
MCV: 94.1 fL (ref 80.0–100.0)
Platelets: 190 10*3/uL (ref 150–400)
RBC: 4.21 MIL/uL (ref 3.87–5.11)
RDW: 13.2 % (ref 11.5–15.5)
WBC: 3.7 10*3/uL — ABNORMAL LOW (ref 4.0–10.5)
nRBC: 0 % (ref 0.0–0.2)

## 2021-08-11 LAB — TROPONIN I (HIGH SENSITIVITY)
Troponin I (High Sensitivity): 9 ng/L (ref <18)
Troponin I (High Sensitivity): 9 ng/L (ref <18)

## 2021-08-11 NOTE — ED Triage Notes (Signed)
Patient to ER via POV with complaints of a "pulsing sensation" in her chest, reports not feeling right. Denies chest pain. Denies shortness of breath.  States she has been seen here previously for the same.

## 2021-08-11 NOTE — Telephone Encounter (Signed)
Agree with dispo to ED.

## 2021-08-11 NOTE — Telephone Encounter (Signed)
I spoke with pt's husband (DPR signed) he said each day pt does not feel good and he does not know anything about the call to the nurse (Access nurse)earlier. Mr Tate took pts BP now and BP is 128/74 P 77 while sitting. I spoke with pt and symptoms started this morning earlier. Pt said also earlier this morning had tightness in middle of chest; lasted 30 -45 mins. Tight feeling did not go anywhere except her chest. Pt said head and chest does not feel heavy but "feels funny". Pt said she walks normal and pt is slow in movement and shakey but pt can pick up something such as a cup.pt said now has lightheaded feeling. No H/A,no SOB, pt said no blurred or double vision. Pt said she takes longer to say what she wants to say; pt has to think before she can speak and pt has to speak slower than usual. Pt is afraid something is going on and now she has a anxious feeling in her chest. Not CP but "funny feeling".  Pt said the only way she knows how to describe feeling in her head is a "funny feeling". No slurred speech. Pt said she has not felt like this before.No available appts at [facility] today. Pt declines 911  and pt said that her husband will take pt to [facility] ED now. Sending note to Dr Diona Browner and Butch Penny CMA. Will also teams Butch Penny.

## 2021-08-11 NOTE — ED Provider Notes (Signed)
[facility] Provider Note    Event Date/Time   First MD Initiated Contact with Patient 08/11/21 1539     (approximate)   History   Palpitations   HPI  Jessica Hardin is a 81 y.o. female history of hypertension and anxiety  Throughout the course the day today she is felt a feeling of a "lowness" she describes it in her chest.  She reports its not a pain is not shortness of breath is not really been a discomfort for just a strange feeling of feeling "low" in the chest.  No headache no nausea no vomiting.  Eating and drinking normally walking normally.  She just reports she just feels a little bit low.  She cannot really describe it otherwise.  No pain or discomfort with urination  Reviewed her previous notes from August 2022   She reports she had something sort of similar last year was seen in the ER and discharged  Physical Exam   Triage Vital Signs: ED Triage Vitals  Enc Vitals Group     BP 08/11/21 1527 96/86     Pulse Rate 08/11/21 1527 77     Resp 08/11/21 1527 19     Temp 08/11/21 1527 98.9 F (37.2 C)     Temp Source 08/11/21 1527 Oral     SpO2 08/11/21 1527 98 %     Weight 08/11/21 1525 125 lb (56.7 kg)     Height 08/11/21 1525 4\' 10"  (1.473 m)     Head Circumference --      Peak Flow --      Pain Score 08/11/21 1525 0     Pain Loc --      Pain Edu? --      Excl. in [place]? --     Most recent vital signs: Vitals:   08/11/21 1830 08/11/21 1900  BP: (!) 136/103 (!) 146/66  Pulse: 64 61  Resp: (!) 22 20  Temp:    SpO2: 100% 100%     General: Awake, no distress.  Very pleasant.  She and her husband both very pleasant CV:  Good peripheral perfusion.  No murmurs rubs or gallops.  Normal rate and rhythm Resp:  Normal effort.  Clear lungs bilaterally.  Speaks full clear sentences normal oxygen saturation on room air Abd:  No distention.  No pain or discomfort any quadrant Other:  No lower extremity edema.   ED Results / Procedures  / Treatments   Labs (all labs ordered are listed, but only abnormal results are displayed) Labs Reviewed  BASIC METABOLIC PANEL - Abnormal; Notable for the following components:      Result Value   Glucose, Bld 116 (*)    GFR, Estimated 58 (*)    All other components within normal limits  CBC - Abnormal; Notable for the following components:   WBC 3.7 (*)    All other components within normal limits  TROPONIN I (HIGH SENSITIVITY)  TROPONIN I (HIGH SENSITIVITY)     EKG  EKG is reviewed inter by me at 1533 Sinus rhythm heart rate 70 QRS 100 QTc 400 Normal sinus rhythm, mild ST segment depression in V5 and V6.  Repeat EKG is reviewed by me at 1540 Heart rate 70 QRS 80 QTc 400 Normal sinus rhythm, T wave inversion V5 and V6.  Compared with previous from today no significant changes found  Compared both EKGs to previous EKG from January 18, 2021 and I see no significant changes in  her EKG or ST segments   RADIOLOGY  Chest x-ray personally reviewed by me, notable for mild cardiomegaly but no other acute finding     PROCEDURES:  Critical Care performed: No  Procedures   MEDICATIONS ORDERED IN ED: Medications - No data to display   IMPRESSION / MDM / Garza-[place] II / ED COURSE  I reviewed the triage vital signs and the nursing notes.                              Differential diagnosis is broad, but somewhat hard to entirely delineate symptomatology.  She describes a feeling of feeling low, she denies it feels like pain and it does not really feel like discomfort there is no shortness of breath this is a feeling of just feeling low in her chest.  Her EKG reassuring.  Given her age we will certainly further evaluate, obtain troponin, CBC metabolic panel.  I do not have any particular medication that I would immediately offer her at this time.  She appears euvolemic denies any infectious symptoms no fevers no abdominal pain.  Initial blood pressure demonstrates  hypotension, but blood pressure 500 systolic shortly thereafter when measured in the room   The patient is on the cardiac monitor to evaluate for evidence of arrhythmia and/or significant heart rate changes.  2 troponins are normal.  Chest x-ray normal.  Patient's symptomatology does not really include any dyspnea or chest pain.  No noted risk factors for PE, does not appear consistent with symptoms of a dissection.  Very reassuring.  No obvious infectious etiology.  Symptoms of "lowness" that she described in her chest somewhat hard to tack down may be representative palpitations but no noted arrhythmia here.  Discussed with the patient and recommended follow-up with primary care physician and discussed return precautions with her and her husband.  Both very agreeable with this plan.  Currently resting comfortably without distress  Return precautions and treatment recommendations and follow-up discussed with the patient who is agreeable with the plan.      FINAL CLINICAL IMPRESSION(S) / ED DIAGNOSES   Final diagnoses:  Palpitations     Rx / DC Orders   ED Discharge Orders     None        Note:  This document was prepared using Dragon voice recognition software and may include unintentional dictation errors.   Delman Kitten, MD 08/11/21 [phone]

## 2021-08-11 NOTE — Telephone Encounter (Signed)
[facility] - Client TELEPHONE ADVICE RECORD AccessNurse Patient Name: Breya OR RIDGE Gender: Female DOB: [birth year] Age: 81 Y 11 M 1 D Return Phone Number: [phone] (Primary), [phone] (Secondary) Address: City/ State/ Zip: [place]  [address] Client [facility] - Client Client Site [place] - Day Provider Eliezer Lofts - MD Contact Type Call Who Is Calling Patient / Member / Family / Caregiver Call Type Triage / Clinical Relationship To Patient Self Return Phone Number [phone] (Primary) Chief Complaint Dizziness Reason for Call Symptomatic / Request for [place] states she is from [place] and has a pt that is experiencing dizziness and is foggy headed. Translation No Nurse Assessment Nurse: Velta Addison, RN, [place] Date/Time (Eastern Time): 08/11/2021 1:55:22 PM Confirm and document reason for call. If symptomatic, describe symptoms. ---Caller states she is from [place] and has a pt that is experiencing dizziness and is foggy headed. Caller states she felt a little light headed yesterday but today it is worse. States she has had these in the past but today it is not getting any better. Does the patient have any new or worsening symptoms? ---Yes Will a triage be completed? ---Yes Related visit to physician within the last 2 weeks? ---No Does the PT have any chronic conditions? (i.e. diabetes, asthma, this includes High risk factors for pregnancy, etc.) ---No Is this a behavioral health or substance abuse call? ---No Guidelines Guideline Title Affirmed Question Affirmed Notes Nurse Date/Time (Eastern Time) Dizziness - Lightheadedness Sounds like a life-threatening emergency to the triager Velta Addison, [place], [place] 08/11/2021 1:57:37 PM Disp. Time Eilene Ghazi Time) Disposition Final User 08/11/2021 2:05:23 PM 911 Outcome Documentation Parrott, RN,  [place] Reason: Refused; PLEASE NOTE: All timestamps contained within this report are represented as [place] Standard Time. CONFIDENTIALTY NOTICE: This fax transmission is intended only for the addressee. It contains information that is legally privileged, confidential or otherwise protected from use or disclosure. If you are not the intended recipient, you are strictly prohibited from reviewing, disclosing, copying using or disseminating any of this information or taking any action in reliance on or regarding this information. If you have received this fax in error, please notify us immediately by telephone so that we can arrange for its return to [place]. Phone: [phone], Toll-Free: [phone], Fax: [phone] Page: 2 of 2 Call Id: [id number] 08/11/2021 2:03:06 PM Call EMS 911 Now Yes Velta Addison, RN, [place] Disagree/Comply Disagree Caller Understands Yes PreDisposition Call Doctor Care Advice Given Per Guideline CALL EMS 911 NOW: * Immediate medical attention is needed. You need to hang up and call 911 (or an ambulance). CARE ADVICE given per Dizziness (Adult) guideline. Comments User: Hamilton Capri, RN Date/Time (Eastern Time): 08/11/2021 2:05:06 PM Caller states if she gets up to move she is swaying forward or sideways. States she feels like she is going to fall. Denies passing out but states she is extremely dizzy. States she will sometimes take extra doses of medication and she does not feel that she has this time. Discussing with husband to see if she is going to call 911 or if she is going to wait to see if it goes away. Referrals GO TO FACILITY UNDECIDE

## 2021-08-18 ENCOUNTER — Ambulatory Visit (INDEPENDENT_AMBULATORY_CARE_PROVIDER_SITE_OTHER): Payer: PPO | Admitting: Family Medicine

## 2021-08-18 ENCOUNTER — Other Ambulatory Visit: Payer: Self-pay

## 2021-08-18 VITALS — BP 138/68 | HR 79 | Temp 97.8°F | Resp 16 | Ht <= 58 in | Wt 135.2 lb

## 2021-08-18 DIAGNOSIS — F320 Major depressive disorder, single episode, mild: Secondary | ICD-10-CM | POA: Diagnosis not present

## 2021-08-18 DIAGNOSIS — I10 Essential (primary) hypertension: Secondary | ICD-10-CM | POA: Diagnosis not present

## 2021-08-18 DIAGNOSIS — E7800 Pure hypercholesterolemia, unspecified: Secondary | ICD-10-CM

## 2021-08-18 DIAGNOSIS — F411 Generalized anxiety disorder: Secondary | ICD-10-CM

## 2021-08-18 DIAGNOSIS — F0280 Dementia in other diseases classified elsewhere without behavioral disturbance: Secondary | ICD-10-CM | POA: Diagnosis not present

## 2021-08-18 DIAGNOSIS — G309 Alzheimer's disease, unspecified: Secondary | ICD-10-CM

## 2021-08-18 MED ORDER — VENLAFAXINE HCL ER 37.5 MG PO CP24: 37.5000 mg | ORAL_CAPSULE | Freq: Every day | ORAL | 0 refills | Status: DC

## 2021-08-18 NOTE — Assessment & Plan Note (Signed)
Stable, chronic.  Continue current medication. ? ? ?Amlodipine 2.5 mg 2 tablets daily ?

## 2021-08-18 NOTE — Assessment & Plan Note (Signed)
Family does not believe she is currently taking this medication.  They would like to restart it to decrease her cardiovascular risk despite her age. ?

## 2021-08-18 NOTE — Assessment & Plan Note (Signed)
Chronic, inadequate control. ?

## 2021-08-18 NOTE — Assessment & Plan Note (Signed)
Given possible benefit of Aricept, we will consider restarting this in the future.  We will hold off for now. ?

## 2021-08-18 NOTE — Progress Notes (Signed)
? ? Patient ID: Jessica Hardin, female    DOB: 28-Oct-1940, 81 y.o.   MRN: [id number] ? ?This visit was conducted in person. ? ?BP 138/68   Pulse 79   Temp 97.8 ?F (36.6 ?C)   Resp 16   Ht 4\' 10"  (1.473 m)   Wt 135 lb 3.2 oz (61.3 kg)   SpO2 97%   BMI 28.26 kg/m?   ? ?CC:  ?Chief Complaint  ?Patient presents with  ? Discuss medication  ? ? ?Subjective:  ? ?HPI: ?Jessica Hardin is a 81 y.o. female presenting on 08/18/2021 for Discuss medication ? ? She is here with her daughter and husband today in the office. ?She remains confused about her medications. ? She has seen seen neurology in the past as well as psychiatry ? Dr. Nicolasa Ducking psychiatry.  Her daughter is very concerned about psychiatric medication side effects and would like to limit her medications if able. ? ? She has been feeling  dizzy off and on.  She had a recent ER visit on February 23,2023.  ER note reviewed in detail. ? ? Daughter is worried that she eats less she should... if she eats more she seems to feel better, less dizziness. ? ?Daughter and patient both report improvement in sleep on trazodone. ? ?She continues to have confusion and memory loss. ?   ? ?Relevant past medical, surgical, family and social history reviewed and updated as indicated. Interim medical history since our last visit reviewed. ?Allergies and medications reviewed and updated. ?Outpatient Medications Prior to Visit  ?Medication Sig Dispense Refill  ? amLODipine (NORVASC) 2.5 MG tablet Take 2 tablets by mouth once daily 180 tablet 3  ? atorvastatin (LIPITOR) 10 MG tablet Take 1 tablet (10 mg total) by mouth daily. (Patient not taking: Reported on 07/14/2021) 90 tablet 1  ? donepezil (ARICEPT) 5 MG tablet Take 5 mg by mouth at bedtime. (Patient not taking: Reported on 07/12/2021)    ? latanoprost (XALATAN) 0.005 % ophthalmic solution Place 1 drop into both eyes at bedtime.    ? mirtazapine (REMERON) 15 MG tablet Take 7.5 mg by mouth at bedtime.    ? Multiple Vitamin  (MULTIVITAMIN) capsule Take 1 capsule by mouth daily.  (Patient not taking: Reported on 07/12/2021)    ? traZODone (DESYREL) 50 MG tablet Take 25-50 mg by mouth at bedtime as needed.    ? venlafaxine XR (EFFEXOR-XR) 75 MG 24 hr capsule Take 75 mg by mouth every morning.    ? ?No facility-administered medications prior to visit.  ?  ? ?Per HPI unless specifically indicated in ROS section below ?Review of Systems  ?Constitutional:  Negative for fatigue and fever.  ?HENT:  Negative for ear pain.   ?Eyes:  Negative for pain.  ?Respiratory:  Negative for chest tightness and shortness of breath.   ?Cardiovascular:  Negative for chest pain, palpitations and leg swelling.  ?Gastrointestinal:  Negative for abdominal pain.  ?Genitourinary:  Negative for dysuria.  ?Objective:  ?BP 138/68   Pulse 79   Temp 97.8 ?F (36.6 ?C)   Resp 16   Ht 4\' 10"  (1.473 m)   Wt 135 lb 3.2 oz (61.3 kg)   SpO2 97%   BMI 28.26 kg/m?   ?Wt Readings from Last 3 Encounters:  ?08/18/21 135 lb 3.2 oz (61.3 kg)  ?08/11/21 125 lb (56.7 kg)  ?02/17/21 131 lb (59.4 kg)  ?  ?  ?Physical Exam ?Constitutional:   ?   General: She is  not in acute distress. ?   Appearance: Normal appearance. She is well-developed. She is not ill-appearing or toxic-appearing.  ?HENT:  ?   Head: Normocephalic.  ?   Right Ear: Hearing, tympanic membrane, ear canal and external ear normal. Tympanic membrane is not erythematous, retracted or bulging.  ?   Left Ear: Hearing, tympanic membrane, ear canal and external ear normal. Tympanic membrane is not erythematous, retracted or bulging.  ?   Nose: No mucosal edema or rhinorrhea.  ?   Right Sinus: No maxillary sinus tenderness or frontal sinus tenderness.  ?   Left Sinus: No maxillary sinus tenderness or frontal sinus tenderness.  ?   Mouth/Throat:  ?   Pharynx: Uvula midline.  ?Eyes:  ?   General: Lids are normal. Lids are everted, no foreign bodies appreciated.  ?   Conjunctiva/sclera: Conjunctivae normal.  ?   Pupils: Pupils  are equal, round, and reactive to light.  ?Neck:  ?   Thyroid: No thyroid mass or thyromegaly.  ?   Vascular: No carotid bruit.  ?   Trachea: Trachea normal.  ?Cardiovascular:  ?   Rate and Rhythm: Normal rate and regular rhythm.  ?   Pulses: Normal pulses.  ?   Heart sounds: Normal heart sounds, S1 normal and S2 normal. No murmur heard. ?  No friction rub. No gallop.  ?Pulmonary:  ?   Effort: Pulmonary effort is normal. No tachypnea or respiratory distress.  ?   Breath sounds: Normal breath sounds. No decreased breath sounds, wheezing, rhonchi or rales.  ?Abdominal:  ?   General: Bowel sounds are normal.  ?   Palpations: Abdomen is soft.  ?   Tenderness: There is no abdominal tenderness.  ?Musculoskeletal:  ?   Cervical back: Normal range of motion and neck supple.  ?Skin: ?   General: Skin is warm and dry.  ?   Findings: No rash.  ?Neurological:  ?   Mental Status: She is alert.  ?Psychiatric:     ?   Mood and Affect: Mood is not anxious or depressed.     ?   Speech: Speech normal.     ?   Behavior: Behavior normal. Behavior is cooperative.     ?   Thought Content: Thought content normal.     ?   Judgment: Judgment normal.  ? ?   ?Results for orders placed or performed during the hospital encounter of 08/11/21  ?Basic metabolic panel  ?Result Value Ref Range  ? Sodium 141 135 - 145 mmol/L  ? Potassium 3.8 3.5 - 5.1 mmol/L  ? Chloride 105 98 - 111 mmol/L  ? CO2 27 22 - 32 mmol/L  ? Glucose, Bld 116 (H) 70 - 99 mg/dL  ? BUN 12 8 - 23 mg/dL  ? Creatinine, Ser 0.98 0.44 - 1.00 mg/dL  ? Calcium 9.2 8.9 - 10.3 mg/dL  ? GFR, Estimated 58 (L) >60 mL/min  ? Anion gap 9 5 - 15  ?CBC  ?Result Value Ref Range  ? WBC 3.7 (L) 4.0 - 10.5 K/uL  ? RBC 4.21 3.87 - 5.11 MIL/uL  ? Hemoglobin 12.3 12.0 - 15.0 g/dL  ? HCT 39.6 36.0 - 46.0 %  ? MCV 94.1 80.0 - 100.0 fL  ? MCH 29.2 26.0 - 34.0 pg  ? MCHC 31.1 30.0 - 36.0 g/dL  ? RDW 13.2 11.5 - 15.5 %  ? Platelets 190 150 - 400 K/uL  ? nRBC 0.0 0.0 - 0.2 %  ?  Troponin I (High Sensitivity)   ?Result Value Ref Range  ? Troponin I (High Sensitivity) 9 <18 ng/L  ?Troponin I (High Sensitivity)  ?Result Value Ref Range  ? Troponin I (High Sensitivity) 9 <18 ng/L  ? ? ?This visit occurred during the SARS-CoV-2 public health emergency.  Safety protocols were in place, including screening questions prior to the visit, additional usage of staff PPE, and extensive cleaning of exam room while observing appropriate contact time as indicated for disinfecting solutions.  ? ?COVID 19 screen:  No recent travel or known exposure to [place] ?The patient denies respiratory symptoms of COVID 19 at this time. ?The importance of social distancing was discussed today.  ? ?Assessment and Plan ? ?  ?Problem List Items Addressed This Visit   ? ? Alzheimer disease ([place])  ?  Given possible benefit of Aricept, we will consider restarting this in the future.  We will hold off for now. ?  ?  ? Relevant Medications  ? venlafaxine XR (EFFEXOR-XR) 37.5 MG 24 hr capsule  ? Essential hypertension - Primary  ?  Stable, chronic.  Continue current medication. ? ? ?Amlodipine 2.5 mg 2 tablets daily ?  ?  ? GAD (generalized anxiety disorder)  ?  Chronic , inadequate control ? ?Given possible side effects we will try to reduce her medicine load.  Continue trazodone and Remeron at current doses.  We will wean off the venlafaxine gradually.  We will follow-up closely in 2 to 3 weeks for consideration of weaning off Remeron as well. ?  ?  ? Relevant Medications  ? venlafaxine XR (EFFEXOR-XR) 37.5 MG 24 hr capsule  ? Hypercholesteremia  ?  Family does not believe she is currently taking this medication.  They would like to restart it to decrease her cardiovascular risk despite her age. ?  ?  ? MDD (major depressive disorder), single episode, mild ([place])  ?  Chronic, inadequate control. ?  ?  ? Relevant Medications  ? venlafaxine XR (EFFEXOR-XR) 37.5 MG 24 hr capsule  ? ? ? ? ?Eliezer Lofts, MD  ? ?

## 2021-08-18 NOTE — Patient Instructions (Signed)
Wean off venlafaxine..... I will send in the lower dose to 37.5 mg capsules daily to use daily, x 1 week then every other day x 1 week then stop. ? Continue trazodone at night for sleep. ? Continue is mirtazapine ( remeron)  ? ?

## 2021-08-18 NOTE — Assessment & Plan Note (Signed)
Chronic , inadequate control ? ?Given possible side effects we will try to reduce her medicine load.  Continue trazodone and Remeron at current doses.  We will wean off the venlafaxine gradually.  We will follow-up closely in 2 to 3 weeks for consideration of weaning off Remeron as well. ?

## 2021-08-31 ENCOUNTER — Other Ambulatory Visit: Payer: Self-pay | Admitting: Family Medicine

## 2021-08-31 NOTE — Telephone Encounter (Signed)
See last office note regarding Effexor. Should medication be refills ?

## 2021-08-31 NOTE — Telephone Encounter (Signed)
Patient's husband called in re: refill for his wife, let him know the refill is being processed, but could take  48-72 hours to process ? ?Patient stated he would call the pharmacy tomorrow ?

## 2021-08-31 NOTE — Telephone Encounter (Signed)
?  Encourage patient to contact the pharmacy for refills or they can request refills through [facility] ? ?LAST APPOINTMENT DATE:  Please schedule appointment if longer than 1 year ? ?NEXT APPOINTMENT DATE: ? ?MEDICATION:venlafaxine XR (EFFEXOR-XR) 37.5 MG 24 hr capsule ? ?Is the patient out of medication?  ? ?[place], [place] ? ?Let patient know to contact pharmacy at the end of the day to make sure medication is ready. ? ?Please notify patient to allow 48-72 hours to process ? ?CLINICAL FILLS OUT ALL BELOW:  ? ?LAST REFILL: ? ?QTY: ? ?REFILL DATE: ? ? ? ?OTHER COMMENTS:  ? ? ?Okay for refill? ? ?Please advise ? ? ?  ?

## 2021-09-01 NOTE — Telephone Encounter (Signed)
Husband called again. Advised of below. He did not know anything about patient weaning off and he could not confirm this. As far as he knows their daughter is not fixing any medications for the patient. ? ?Please call daughter-Stacy to see if she is aware of this and to get more information. Thank you ?

## 2021-09-01 NOTE — Addendum Note (Signed)
Addended by: Kris Mouton on: 09/01/2021 10:42 AM ? ? Modules accepted: Orders ? ?

## 2021-09-01 NOTE — Telephone Encounter (Signed)
Patient's husband called back to check on this-patient took her last dose yesterday or the day before. Would like to please get a call today about this. Thank you. ?

## 2021-09-01 NOTE — Telephone Encounter (Signed)
Patient was to wean down off this medication and prescription  for #15 with weaning instructions was sent in at the last office visit. ?Has she weaned down?  If not, let me know ? ?It may be best to contact her daughter who sets up her medicine boxes. ?

## 2021-09-01 NOTE — Telephone Encounter (Signed)
There is not contact information for  pt's daughter  , called and spoke with Patient  she give me verbal okay to speck to her daughter. She is going to have her daughter to call  us back regarding her medication.because she didn't know the her number . Pt is going to sign a dpr when she come in for her appt given [place] permission to speck with her daughter regarding her care. ?

## 2021-09-02 ENCOUNTER — Telehealth: Payer: Self-pay | Admitting: Family Medicine

## 2021-09-02 MED ORDER — VENLAFAXINE HCL ER 37.5 MG PO CP24: 37.5000 mg | ORAL_CAPSULE | Freq: Every day | ORAL | 5 refills | Status: DC

## 2021-09-02 NOTE — Telephone Encounter (Signed)
No appt needed. This was just confusion about the venlafaxine. ? If they would like to continue low dose venlafaxine low dose long term insteead of eweaning off, that is fine. ? ? I will send in with refills. ? Let husband know. ?

## 2021-09-02 NOTE — Telephone Encounter (Signed)
Pt daughter called stating that when Dr Diona Browner lower the dosage of medication venlafaxine XR (EFFEXOR-XR) 37.5 MG 24 hr capsule she was doing much better. Pt daughter states now she is getting confused she's uncomfortable and feeling dizzy and doesn't feel like herself since Monday or Tuesday of this week. Please advise. ?

## 2021-09-02 NOTE — Addendum Note (Signed)
Addended by: Helene Shoe on: 09/02/2021 08:55 AM ? ? Modules accepted: Orders ? ?

## 2021-09-02 NOTE — Telephone Encounter (Signed)
[facility] - Client ?Nonclinical Telephone Record  ?[facility]? ?Client [facility] - Client ?Client Site [place] ?Provider Eliezer Lofts - MD ?Contact Type Call ?Who Is Calling Patient / Member / Family / Caregiver ?Caller Name Ronette Deter ?Caller Phone Number [phone] ?Call Type Message Only Information Provided ?Reason for Call Returning a Call from the Office ?Initial Comment Caller states her mother's doctor called her today and missed the call. ?Disp. Time Disposition Final User ?09/01/2021 5:18:43 PM General Information Provided Yes Rosario Jacks ?Call Closed By: Rosario Jacks ?Transaction Date/Time: 09/01/2021 5:16:56 PM (ET ?

## 2021-09-02 NOTE — Telephone Encounter (Signed)
Called patient and was advised that her husband is not at home. Patient requested that I call her daughter. ?Called her daughter and got her voicemail. Left a message on voicemail to call the office back. ?

## 2021-09-02 NOTE — Telephone Encounter (Signed)
I spoke with pts daughter Ronette Deter, Erline Levine said initially pt was feeling better on lower dose of Effexor and pt has been taking the weaning off dose as instructed. Erline Levine said that pt still has med but she does not know how many pills pt has left. Pt said that pts husband is functional and he fills pts med box.Erline Levine does not live with pt and does not see her daily. Erline Levine said starting on 08/29/21 pt seemed somewhat confused about what med she was to be taking. Pt knows who she is, who is around her, and where she is. No hallucinations, no dizziness. Erline Levine said pt is basically the same as when she saw Dr Diona Browner last week. Pt is no more forgetful than usual. No H/A, CP,SOB and no weakness in extremities. Pt is presently taking the amlodipine,atorvastatin,multi vitamin, venlafaxine and a ageless brain supplement that Dr Diona Browner approved. No SI/HI. Erline Levine said pt has regressed slightly and pt said she just doesn't quite feel like herself. Erline Levine said would schedule appt if needed but stacey said since just seen last wk she is not sure pt needs appt. [name] request cb after reviewed by Dr Diona Browner. Sending note to Dr Diona Browner and Butch Penny CMA. [address] ?

## 2021-09-05 NOTE — Telephone Encounter (Signed)
Patient's husband notified as instructed by telephone and verbalized understanding. Patient's husband stated that his wife is doing fine now and has an appointment scheduled with Dr. Diona Browner Friday 09/09/21. ?

## 2021-09-05 NOTE — Telephone Encounter (Signed)
Called and spoke to patient's husband. Patient's husband stated that his wife now is doing fine back on the medication. Patient's husband stated that she has an upcoming appointment scheduled with Dr. Diona Browner 09/09/21 and she plans on coming for the appointment. Advised patient's husband if his wife has any more problems before the appointment to call the office back.  ?

## 2021-09-05 NOTE — Telephone Encounter (Signed)
Tried to call patient's daughter back and got a message that call can not be completed at this time and to call again later. ?

## 2021-09-05 NOTE — Telephone Encounter (Signed)
Tried to call daughter and got a message that call can not be completed at this time and to call back again later. ?See other phone call on patient. ?

## 2021-09-06 NOTE — Telephone Encounter (Signed)
Noted  

## 2021-09-09 ENCOUNTER — Ambulatory Visit (INDEPENDENT_AMBULATORY_CARE_PROVIDER_SITE_OTHER): Payer: PPO | Admitting: Family Medicine

## 2021-09-09 ENCOUNTER — Encounter: Payer: Self-pay | Admitting: Family Medicine

## 2021-09-09 ENCOUNTER — Other Ambulatory Visit: Payer: Self-pay

## 2021-09-09 VITALS — BP 118/64 | HR 87 | Ht <= 58 in | Wt 134.0 lb

## 2021-09-09 DIAGNOSIS — G309 Alzheimer's disease, unspecified: Secondary | ICD-10-CM | POA: Diagnosis not present

## 2021-09-09 DIAGNOSIS — E7800 Pure hypercholesterolemia, unspecified: Secondary | ICD-10-CM

## 2021-09-09 DIAGNOSIS — I10 Essential (primary) hypertension: Secondary | ICD-10-CM | POA: Diagnosis not present

## 2021-09-09 DIAGNOSIS — F320 Major depressive disorder, single episode, mild: Secondary | ICD-10-CM | POA: Diagnosis not present

## 2021-09-09 DIAGNOSIS — F411 Generalized anxiety disorder: Secondary | ICD-10-CM

## 2021-09-09 DIAGNOSIS — R42 Dizziness and giddiness: Secondary | ICD-10-CM

## 2021-09-09 DIAGNOSIS — F0280 Dementia in other diseases classified elsewhere without behavioral disturbance: Secondary | ICD-10-CM | POA: Diagnosis not present

## 2021-09-09 DIAGNOSIS — F5104 Psychophysiologic insomnia: Secondary | ICD-10-CM | POA: Diagnosis not present

## 2021-09-09 NOTE — Assessment & Plan Note (Signed)
Resolved on lower doses of medication. ?

## 2021-09-09 NOTE — Assessment & Plan Note (Signed)
Chronic, well controlled on 2 tablets of amlodipine 2.5 mg daily. ?

## 2021-09-09 NOTE — Patient Instructions (Signed)
Continue medications as discussed. ?

## 2021-09-09 NOTE — Progress Notes (Signed)
? ? Patient ID: Jessica Hardin, female    DOB: 1941/03/30, 81 y.o.   MRN: [id number] ? ?This visit was conducted in person. ? ?BP 118/64   Pulse 87   Ht '4\' 10"'$  (1.473 m)   Wt 134 lb (60.8 kg)   SpO2 95%   BMI 28.01 kg/m?   ? ?CC:  ?Chief Complaint  ?Patient presents with  ? Follow-up  ?  Pt stated--anxiety, sleeping getting sleep.  ? ? ?Subjective:  ? ?HPI: ?Jessica Hardin is a 81 y.o. female presenting on 09/09/2021 for Follow-up (Pt stated--anxiety, sleeping getting sleep.) ?Husband at office visit today, providing most of history.  He brought her medication bottles and pill dispenser with them to the appointment today.  We threw away extra bottles of medication and found 1 bottle that was labled amlodipine and had remeron in it... This was discarded. ? ?At last OV  08/18/3021 ? Daughter was present and wanted to reduce psychiatric meds as she felt it was contributing to dizziness. Also skipping meals likely contributing to weakness and dizziness  ?Wt Readings from Last 3 Encounters:  ?09/09/21 134 lb (60.8 kg)  ?08/18/21 135 lb 3.2 oz (61.3 kg)  ?08/11/21 125 lb (56.7 kg)  ? ?Tried weaning off venlafaxine..  when off completely.. she felt poorly. ?Now on 37.5 mg daily . ? She is taking 1/2 tablet at night of remeron 15 mg  ( at 7.5 mg) ? She is not taking trazodone . ? She  sleeping well. Feeling well rested in AM. ? ?No further dizziness. ? ? She is eating, but still small amounts. ? ? PHQ9: 5 ?GAD7: 3 ? ? ?   ? ?Relevant past medical, surgical, family and social history reviewed and updated as indicated. Interim medical history since our last visit reviewed. ?Allergies and medications reviewed and updated. ?Outpatient Medications Prior to Visit  ?Medication Sig Dispense Refill  ? amLODipine (NORVASC) 2.5 MG tablet Take 2 tablets by mouth once daily 180 tablet 3  ? latanoprost (XALATAN) 0.005 % ophthalmic solution Place 1 drop into both eyes at bedtime.    ? Multiple Vitamin (MULTIVITAMIN) capsule Take 1  capsule by mouth daily.    ? traZODone (DESYREL) 50 MG tablet Take 25-50 mg by mouth at bedtime as needed.    ? venlafaxine XR (EFFEXOR-XR) 37.5 MG 24 hr capsule Take 1 capsule (37.5 mg total) by mouth daily with breakfast. 30 capsule 5  ? mirtazapine (REMERON) 15 MG tablet Take 7.5 mg by mouth at bedtime. (Patient not taking: Reported on 09/09/2021)    ? atorvastatin (LIPITOR) 10 MG tablet Take 1 tablet (10 mg total) by mouth daily. (Patient not taking: Reported on 07/14/2021) 90 tablet 1  ? ?No facility-administered medications prior to visit.  ?  ? ?Per HPI unless specifically indicated in ROS section below ?Review of Systems  ?Constitutional:  Negative for fatigue and fever.  ?HENT:  Negative for ear pain.   ?Eyes:  Negative for pain.  ?Respiratory:  Negative for chest tightness and shortness of breath.   ?Cardiovascular:  Negative for chest pain, palpitations and leg swelling.  ?Gastrointestinal:  Negative for abdominal pain.  ?Genitourinary:  Negative for dysuria.  ?Objective:  ?BP 118/64   Pulse 87   Ht '4\' 10"'$  (1.473 m)   Wt 134 lb (60.8 kg)   SpO2 95%   BMI 28.01 kg/m?   ?Wt Readings from Last 3 Encounters:  ?09/09/21 134 lb (60.8 kg)  ?08/18/21 135 lb 3.2 oz (61.3  kg)  ?08/11/21 125 lb (56.7 kg)  ?  ?  ?Physical Exam ?Constitutional:   ?   General: She is not in acute distress. ?   Appearance: Normal appearance. She is well-developed. She is not ill-appearing or toxic-appearing.  ?HENT:  ?   Head: Normocephalic.  ?   Right Ear: Hearing, tympanic membrane, ear canal and external ear normal. Tympanic membrane is not erythematous, retracted or bulging.  ?   Left Ear: Hearing, tympanic membrane, ear canal and external ear normal. Tympanic membrane is not erythematous, retracted or bulging.  ?   Nose: No mucosal edema or rhinorrhea.  ?   Right Sinus: No maxillary sinus tenderness or frontal sinus tenderness.  ?   Left Sinus: No maxillary sinus tenderness or frontal sinus tenderness.  ?   Mouth/Throat:  ?    Pharynx: Uvula midline.  ?Eyes:  ?   General: Lids are normal. Lids are everted, no foreign bodies appreciated.  ?   Conjunctiva/sclera: Conjunctivae normal.  ?   Pupils: Pupils are equal, round, and reactive to light.  ?Neck:  ?   Thyroid: No thyroid mass or thyromegaly.  ?   Vascular: No carotid bruit.  ?   Trachea: Trachea normal.  ?Cardiovascular:  ?   Rate and Rhythm: Normal rate and regular rhythm.  ?   Pulses: Normal pulses.  ?   Heart sounds: Normal heart sounds, S1 normal and S2 normal. No murmur heard. ?  No friction rub. No gallop.  ?Pulmonary:  ?   Effort: Pulmonary effort is normal. No tachypnea or respiratory distress.  ?   Breath sounds: Normal breath sounds. No decreased breath sounds, wheezing, rhonchi or rales.  ?Abdominal:  ?   General: Bowel sounds are normal.  ?   Palpations: Abdomen is soft.  ?   Tenderness: There is no abdominal tenderness.  ?Musculoskeletal:  ?   Cervical back: Normal range of motion and neck supple.  ?Skin: ?   General: Skin is warm and dry.  ?   Findings: No rash.  ?Neurological:  ?   Mental Status: She is alert.  ?Psychiatric:     ?   Mood and Affect: Mood is not anxious or depressed.     ?   Speech: Speech normal.     ?   Behavior: Behavior normal. Behavior is cooperative.     ?   Thought Content: Thought content normal.     ?   Judgment: Judgment normal.  ? ?   ?Results for orders placed or performed during the hospital encounter of 08/11/21  ?Basic metabolic panel  ?Result Value Ref Range  ? Sodium 141 135 - 145 mmol/L  ? Potassium 3.8 3.5 - 5.1 mmol/L  ? Chloride 105 98 - 111 mmol/L  ? CO2 27 22 - 32 mmol/L  ? Glucose, Bld 116 (H) 70 - 99 mg/dL  ? BUN 12 8 - 23 mg/dL  ? Creatinine, Ser 0.98 0.44 - 1.00 mg/dL  ? Calcium 9.2 8.9 - 10.3 mg/dL  ? GFR, Estimated 58 (L) >60 mL/min  ? Anion gap 9 5 - 15  ?CBC  ?Result Value Ref Range  ? WBC 3.7 (L) 4.0 - 10.5 K/uL  ? RBC 4.21 3.87 - 5.11 MIL/uL  ? Hemoglobin 12.3 12.0 - 15.0 g/dL  ? HCT 39.6 36.0 - 46.0 %  ? MCV 94.1 80.0 -  100.0 fL  ? MCH 29.2 26.0 - 34.0 pg  ? MCHC 31.1 30.0 - 36.0 g/dL  ?  RDW 13.2 11.5 - 15.5 %  ? Platelets 190 150 - 400 K/uL  ? nRBC 0.0 0.0 - 0.2 %  ?Troponin I (High Sensitivity)  ?Result Value Ref Range  ? Troponin I (High Sensitivity) 9 <18 ng/L  ?Troponin I (High Sensitivity)  ?Result Value Ref Range  ? Troponin I (High Sensitivity) 9 <18 ng/L  ? ? ?This visit occurred during the SARS-CoV-2 public health emergency.  Safety protocols were in place, including screening questions prior to the visit, additional usage of staff PPE, and extensive cleaning of exam room while observing appropriate contact time as indicated for disinfecting solutions.  ? ?COVID 19 screen:  No recent travel or known exposure to Jessica Hardin ?The patient denies respiratory symptoms of COVID 19 at this time. ?The importance of social distancing was discussed today.  ? ?Assessment and Plan ? ?  ?Problem List Items Addressed This Visit   ? ? Alzheimer disease ([place])  ?  Chronic ? ?Working on simplification of medication regimen given patient confusion.  Her husband comes to clinic today and appears to be on top of this.  We will keep the medications simple for now but discuss again re-starting a memory medication for prevention of progression such as Aricept. ?  ?  ? Chronic insomnia  ?  She is sleeping well at night without trazodone.  She is only using half a tablet of Remeron to help with sleep. ?  ?  ? Dizziness  ?  Resolved on lower doses of medication. ?  ?  ? Essential hypertension  ?  Chronic, well controlled on 2 tablets of amlodipine 2.5 mg daily. ?  ?  ? GAD (generalized anxiety disorder) - Primary  ? Hypercholesteremia  ?  Chronic, unclear control ? ?We have decided to currently hold the statin medication to simplify her medication regimen. ?  ?  ? MDD (major depressive disorder), single episode, mild ([place])  ?  Well-controlled on lower dose of venlafaxine.  Encouraged her to walk and get sunshine when pollen has decreased. ?  ?   ? ? ? ?Eliezer Lofts, MD  ? ?

## 2021-09-09 NOTE — Assessment & Plan Note (Signed)
She is sleeping well at night without trazodone.  She is only using half a tablet of Remeron to help with sleep. ?

## 2021-09-09 NOTE — Assessment & Plan Note (Signed)
Chronic, unclear control ? ?We have decided to currently hold the statin medication to simplify her medication regimen. ?

## 2021-09-09 NOTE — Assessment & Plan Note (Addendum)
Chronic ? ?Working on simplification of medication regimen given patient confusion.  Her husband comes to clinic today and appears to be on top of this.  We will keep the medications simple for now but discuss again re-starting a memory medication for prevention of progression such as Aricept. ?

## 2021-09-09 NOTE — Assessment & Plan Note (Signed)
Well-controlled on lower dose of venlafaxine.  Encouraged her to walk and get sunshine when pollen has decreased. ?

## 2021-09-28 ENCOUNTER — Telehealth: Payer: Self-pay | Admitting: Family Medicine

## 2021-09-28 NOTE — Telephone Encounter (Signed)
Mrs. Paulsen notified as instructed by telephone.  Patient states understanding.  Medication list updated. ?

## 2021-09-28 NOTE — Telephone Encounter (Signed)
Call ?I do not think mirtazapine is the cause of this issue.  She is on a very low-dose so can go ahead and stop it just in case. ?If the issue does not resolve when holding mirtazapine, she needs to follow-up with me. ?

## 2021-09-28 NOTE — Telephone Encounter (Signed)
Pt called stating that she thinks medication mirtazapine (REMERON) 15 MG tablet is making her think someone is in the room. Pt states that twice her husband had to go get her and put her back in bed. Please advise. ?

## 2021-10-07 ENCOUNTER — Telehealth: Payer: Self-pay

## 2021-10-07 NOTE — Progress Notes (Signed)
? ? ?  Chronic Care Management ?Pharmacy Assistant  ? ?Name: Jessica Hardin  MRN: [id number] DOB: Jan 23, 1941 ? ?Reason for Encounter: CCM Counsellor) ?  ?Medications: ?Outpatient Encounter Medications as of 10/07/2021  ?Medication Sig  ? amLODipine (NORVASC) 2.5 MG tablet Take 2 tablets by mouth once daily  ? latanoprost (XALATAN) 0.005 % ophthalmic solution Place 1 drop into both eyes at bedtime.  ? Multiple Vitamin (MULTIVITAMIN) capsule Take 1 capsule by mouth daily.  ? venlafaxine XR (EFFEXOR-XR) 37.5 MG 24 hr capsule Take 1 capsule (37.5 mg total) by mouth daily with breakfast.  ? ?No facility-administered encounter medications on file as of 10/07/2021.  ? ?[name] was contacted to remind of upcoming telephone visit with Charlene Brooke on 10/12/2021 at 3:00. Patient was reminded to have any blood glucose and blood pressure readings available for review at appointment.  ? ?Patient confirmed appointment. ? ?Are you having any problems with your medications? No  ? ?Do you have any concerns you like to discuss with the pharmacist? No ? ?Star Rating Drugs: ?Medication:  Last Fill: Day Supply ?No star rating drugs ? ?Charlene Brooke, CPP notified ? ?Marijean Niemann, RMA ?Clinical Pharmacy Assistant ?[phone] ? ? ? ? ? ?

## 2021-10-11 ENCOUNTER — Telehealth: Payer: PPO

## 2021-10-12 ENCOUNTER — Ambulatory Visit (INDEPENDENT_AMBULATORY_CARE_PROVIDER_SITE_OTHER): Payer: PPO | Admitting: Pharmacist

## 2021-10-12 DIAGNOSIS — I10 Essential (primary) hypertension: Secondary | ICD-10-CM

## 2021-10-12 DIAGNOSIS — F0280 Dementia in other diseases classified elsewhere without behavioral disturbance: Secondary | ICD-10-CM

## 2021-10-12 DIAGNOSIS — E7800 Pure hypercholesterolemia, unspecified: Secondary | ICD-10-CM

## 2021-10-12 DIAGNOSIS — F320 Major depressive disorder, single episode, mild: Secondary | ICD-10-CM

## 2021-10-12 DIAGNOSIS — M8580 Other specified disorders of bone density and structure, unspecified site: Secondary | ICD-10-CM

## 2021-10-12 NOTE — Progress Notes (Signed)
? ?Chronic Care Management ?Pharmacy Note ? ?10/12/2021 ?Name:  Jessica Hardin MRN:  [id number] DOB:  [birth year] ? ?Summary: CCM F/U visit ?-Pt reports compliance with medications as prescribed. She reports she IS still taking mirtazapine at night and does not endorse hallucinations currently ?-Pt is due for repeat DEXA (last 2017 showed osteopenia) ? ?Recommendations/Changes made from today's visit: ?-No med changes (added mirtazapine back to med list) ?-Recommend repeat DEXA scan this year ? ?Follow up Plan: ?-Pharmacist follow up televisit scheduled for 6 months ?-PCP annual 01/03/22 ? ? ?Subjective: ?Jessica Hardin is an 81 y.o. year old female who is a primary patient of Bedsole, Amy E, MD.  The CCM team was consulted for assistance with disease management and care coordination needs.   ? ?Engaged with patient by telephone for follow up visit in response to provider referral for pharmacy case management and/or care coordination services.  ? ?Consent to Services:  ?The patient was given information about Chronic Care Management services, agreed to services, and gave verbal consent prior to initiation of services.  Please see initial visit note for detailed documentation.  ? ?Patient Care Team: ?Jinny Sanders, MD as PCP - General (Family Medicine) ?Defrancesco, Alanda Slim, MD as Referring Physician (Obstetrics and Gynecology) ?Robert Bellow, MD (General Surgery) ?Estill Cotta, MD as Referring Physician (Ophthalmology) ?Kristina Bertone, Cleaster Corin, [facility] as Pharmacist (Pharmacist) ? ? ?Recent office visits: ?09/28/21 phone call - pt thinks mirtazapine is making her hallucinate. PCP does not think so but advised she can hold it, advised f/u if issue does not resolve. ? ?09/09/21 Dr Diona Browner OV: f/u anxiety, sleep. Stable on lower dose of venlafaxine. Hold off on statin to simplify regimen. D/C atorvastatin, trazodone to simplify regimen. Still consider restarting donepezil in future. ? ?08/18/21 Dr Diona Browner OV: discuss  meds - wean off venlafaxine. Consider restart donepezil in future. ? ?02/17/2021 - Eliezer Lofts, MD - Patient presented for dizziness. Increase amlodipine to 2 tablets (5 mg total). ?02/03/2021 - Eliezer Lofts, MD - Patient presented for hypertension. If BP spikes > 150/90 for several days.. can increase amlodipine to 5 mg daily ( 2 tablets). No other medication changes.  ? ?Recent consult visits: ?03/07/2021 - [facility] - Psychiatry - Patient presented for major depressive disorder, anxiety and insomnia due to other mental disorder. No other information.  ?01/18/2021 - [facility] - Patient presented for high blood pressure and headache for the last few days. Patient was referred to ED. ?01/03/2021 - [facility] - Ophthalmology - Patient presented for presence of intraocular lens. No other information.  ? ?Hospital visits: ?08/11/21 ED visit - palpitations. Workup reassuring. F/u with PCP. ? ? ?Objective: ? ?Lab Results  ?Component Value Date  ? CREATININE 0.98 08/11/2021  ? BUN 12 08/11/2021  ? GFR 51.67 (L) 12/24/2020  ? GFRNONAA 58 (L) 08/11/2021  ? GFRAA >60 02/04/2018  ? NA 141 08/11/2021  ? K 3.8 08/11/2021  ? CALCIUM 9.2 08/11/2021  ? CO2 27 08/11/2021  ? GLUCOSE 116 (H) 08/11/2021  ? ? ?Lab Results  ?Component Value Date/Time  ? GFR 51.67 (L) 12/24/2020 08:05 AM  ? GFR 67.84 08/30/2018 09:26 AM  ?  ?Last diabetic Eye exam: No results found for: HMDIABEYEEXA  ?Last diabetic Foot exam: No results found for: HMDIABFOOTEX  ? ?Lab Results  ?Component Value Date  ? CHOL 221 (H) 12/24/2020  ? HDL 84.40 12/24/2020  ? LDLCALC 127 (H) 12/24/2020  ? TRIG 52.0 12/24/2020  ? CHOLHDL  3 12/24/2020  ? ? ? ?  Latest Ref Rng & Units 12/24/2020  ?  8:05 AM 08/30/2018  ?  9:26 AM 02/04/2018  ?  6:03 PM  ?Hepatic Function  ?Total Protein 6.0 - 8.3 g/dL 7.5   7.5   7.3    ?Albumin 3.5 - 5.2 g/dL 4.1   4.2   4.0    ?AST 0 - 37 U/L 25   25   23     ?ALT 0 - 35 U/L 18   17   14     ?Alk Phosphatase 39 - 117 U/L 74   79   61     ?Total Bilirubin 0.2 - 1.2 mg/dL 0.4   0.3   0.6    ? ? ?Lab Results  ?Component Value Date/Time  ? TSH 0.96 02/01/2017 12:53 PM  ? TSH 0.66 02/09/2015 10:26 AM  ? FREET4 0.71 02/01/2017 12:53 PM  ? ? ? ?  Latest Ref Rng & Units 08/11/2021  ?  3:41 PM 01/18/2021  ?  7:49 PM 02/04/2018  ?  6:03 PM  ?CBC  ?WBC 4.0 - 10.5 K/uL 3.7   4.7   4.2    ?Hemoglobin 12.0 - 15.0 g/dL 12.3   13.1   12.6    ?Hematocrit 36.0 - 46.0 % 39.6   39.9   38.1    ?Platelets 150 - 400 K/uL 190   180   175    ? ? ?Lab Results  ?Component Value Date/Time  ? VD25OH 36.74 08/30/2018 09:26 AM  ? VD25OH 32.44 02/01/2017 12:53 PM  ? ? ?Clinical ASCVD: No  ?The ASCVD Risk score (Arnett DK, et al., 2019) failed to calculate for the following reasons: ?  The 2019 ASCVD risk score is only valid for ages 19 to 23   ? ? ?  09/09/2021  ?  9:51 AM 12/31/2020  ?  3:25 PM 12/16/2019  ? 12:16 PM  ?Depression screen PHQ 2/9  ?Decreased Interest 1 0 0  ?Down, Depressed, Hopeless 1 0 1  ?PHQ - 2 Score 2 0 1  ?Altered sleeping 1 0 2  ?Tired, decreased energy 1 1 1   ?Change in appetite 1 0 2  ?Feeling bad or failure about yourself  0 0 0  ?Trouble concentrating 0 1 2  ?Moving slowly or fidgety/restless 0 0 1  ?Suicidal thoughts 0 0 0  ?PHQ-9 Score 5 2 9   ?Difficult doing work/chores Somewhat difficult Not difficult at all Somewhat difficult  ?  ? ?  09/09/2021  ?  9:51 AM 09/16/2019  ?  1:06 PM 08/19/2019  ?  9:14 AM 08/08/2019  ?  3:21 PM  ?GAD 7 : Generalized Anxiety Score  ?Nervous, Anxious, on Edge 1 3 3 2   ?Control/stop worrying 1 2 2  0  ?Worry too much - different things 1 2 2 1   ?Trouble relaxing 2 2 2 1   ?Restless 0 1 0 0  ?Easily annoyed or irritable 0 0 1 1  ?Afraid - awful might happen 0 0 1 1  ?Total GAD 7 Score 5 10 11 6   ?Anxiety Difficulty   Somewhat difficult Somewhat difficult  ? ? ?Social History  ? ?Tobacco Use  ?Smoking Status Never  ?Smokeless Tobacco Never  ? ?BP Readings from Last 3 Encounters:  ?09/09/21 118/64  ?08/18/21 138/68  ?08/11/21 (!)  151/80  ? ?Pulse Readings from Last 3 Encounters:  ?09/09/21 87  ?08/18/21 79  ?08/11/21 65  ? ?Wt Readings from  Last 3 Encounters:  ?09/09/21 134 lb (60.8 kg)  ?08/18/21 135 lb 3.2 oz (61.3 kg)  ?08/11/21 125 lb (56.7 kg)  ? ?BMI Readings from Last 3 Encounters:  ?09/09/21 28.01 kg/m?  ?08/18/21 28.26 kg/m?  ?08/11/21 26.13 kg/m?  ? ? ?Assessment/Interventions: Review of patient past medical history, allergies, medications, health status, including review of consultants reports, laboratory and other test data, was performed as part of comprehensive evaluation and provision of chronic care management services.  ? ?SDOH:  (Social Determinants of Health) assessments and interventions performed: Yes ? ? ?SDOH Screenings  ? ?Alcohol Screen: Not on file  ?Depression (PHQ2-9): Medium Risk  ? PHQ-2 Score: 5  ?Financial Resource Strain: Low Risk   ? Difficulty of Paying Living Expenses: Not very hard  ?Food Insecurity: No Food Insecurity  ? Worried About Charity fundraiser in the Last Year: Never true  ? Ran Out of Food in the Last Year: Never true  ?Housing: Not on file  ?Physical Activity: Not on file  ?Social Connections: Not on file  ?Stress: Not on file  ?Tobacco Use: Low Risk   ? Smoking Tobacco Use: Never  ? Smokeless Tobacco Use: Never  ? Passive Exposure: Not on file  ?Transportation Needs: Not on file  ? ? ?[place] ? ?Allergies  ?Allergen Reactions  ? Shellfish Allergy Hives and Other (See Comments)  ?  CHEST PAIN AND ITCHINESS (INTERNAL--MOUTH THRU GI TRACT)  WITH NAUSEA  ? ? ?Medications Reviewed Today   ? ? Reviewed by Charlton Haws, [facility] (Pharmacist) on 10/12/21 at 1516  Med List Status: <None>  ? ?Medication Order Taking? Sig Documenting Provider Last Dose Status Informant  ?amLODipine (NORVASC) 2.5 MG tablet [id number] Yes Take 2 tablets by mouth once daily Bedsole, Amy E, MD Taking Active   ?latanoprost (XALATAN) 0.005 % ophthalmic solution [id number] Yes Place 1 drop into both eyes at bedtime.  [provider] Taking Active Multiple Informants  ?mirtazapine (REMERON) 15 MG tablet [id number] Yes Take 15 mg by mouth at bedtime. [provider] Taking Active   ?Multiple Vitamin (MULT

## 2021-10-12 NOTE — Patient Instructions (Signed)
Visit Information ? ?Phone number for Pharmacist: [phone] ? ? Goals Addressed   ?None ?  ? ? ?Care Plan : [place]  ?Updates made by Charlton Haws, [place] since 10/12/2021 12:00 AM  ?  ? ?Problem: Hypertension, Hyperlipidemia, Depression, Anxiety, and Osteopenia   ?Priority: High  ?  ? ?Long-Range Goal: Disease mgmt   ?Start Date: 07/14/2021  ?Expected End Date: 07/14/2022  ?This Visit's Progress: On track  ?Recent Progress: On track  ?Priority: High  ?Note:   ?Current Barriers:  ?Memory issues; desire for simplified medication regimen ? ?Pharmacist Clinical Goal(s):  ?Patient will contact provider office for questions/concerns as evidenced notation of same in electronic health record through collaboration with PharmD and provider.  ? ?Interventions: ?1:1 collaboration with Jinny Sanders, MD regarding development and update of comprehensive plan of care as evidenced by provider attestation and co-signature ?Inter-disciplinary care team collaboration (see longitudinal plan of care) ?Comprehensive medication review performed; medication list updated in electronic medical record ? ?Hypertension (BP goal <140/90) ?-Controlled - pt endorses compliance with medication and home BP is at goal ?-Current home BP readings: 135/69, P 76 ?-Current treatment: ?Amlodipine 2.5 mg - 2 tab daily - Appropriate, Effective, Safe, Accessible ?-Denies hypotensive/hypertensive symptoms ?-Educated on BP goals and benefits of medications for prevention of heart attack, stroke and kidney damage; ?-Counseled to monitor BP at home periodically, document, and provide log at future appointments ?-Recommended to continue current medication ? ?Hyperlipidemia: (LDL goal < 100) ?-Not ideally controlled - pt and PCP have agreed to hold off on atorvastatin in interest of simplifying medication regimen ?-Current treatment: ?None ?-Medications previously tried: atorvastatin  ?-Educated on Cholesterol goals; Benefits of statin for  ASCVD risk reduction; ? ?Depression/Anxiety (Goal: manage symptoms) ?-Not ideally controlled - per patient she is not sure medication is helping; she still feels down, depressed; she does report she is sleeping fairly well ?-PHQ9: 2 (12/2020) - minimal depression ?-GAD7: 10 (08/2018) - moderate anxiety ?-Connected with Dr Nicolasa Ducking for mental health support ?-Current treatment: ?Venlafaxine ER 37.5 mg daily - Appropriate, Effective, Safe, Accessible ?Mirtazapine 15 mg -1/2 tab HS - Appropriate, Effective, Safe, Accessible ?-Medications previously tried/failed: sertraline, citalopram, trazodone, hydroxyzine ?-Educated on Benefits of medication for symptom control ?-Recommend to continue current medication ? ?Alzheimer's Disease (Goal: slow progression) ?-Not ideally controlled - pt has been off donepezil for a while, has not wanted to add any medications due to desire to simplify regimen ?-Current treatment  ?None ?-Medications previously tried: donepezil ?-Discussed benefits of donepezil for slowing memory decline; consider restarting donepezil in future ? ?Osteopenia (Goal prevent fractures) ?-Not ideally controlled - pt is due for repeat DEXA scan; she is not taking calcium or vitamin D ?-Last DEXA Scan: 11/18/2015  ? T-Score femoral neck: -1.6 ? T-Score lumbar spine: -1.3 ? 10-year probability of major osteoporotic fracture: 5.5% ? 10-year probability of hip fracture: 1.2% ?-Patient is not a candidate for pharmacologic treatment ?-Current treatment  ?None ?-Medications previously tried: n/a  ?-Recommend [phone] units of vitamin D daily. Recommend 1200 mg of calcium daily from dietary and supplemental sources. ?-Recommend repeat DEXA scan this year ? ?Patient Goals/Self-Care Activities ?Patient will:  ?- take medications as prescribed as evidenced by patient report and record review ? ?  ?  ? ?The patient verbalized understanding of instructions, educational materials, and care plan provided today and declined offer to  receive copy of patient instructions, educational materials, and care plan.  ?Telephone follow up appointment with pharmacy team member  scheduled for: 6 months ? ?Charlene Brooke, PharmD, BCACP ?Clinical Pharmacist ?[facility] ?[phone] ?  ?

## 2021-10-16 DIAGNOSIS — E7800 Pure hypercholesterolemia, unspecified: Secondary | ICD-10-CM | POA: Diagnosis not present

## 2021-10-16 DIAGNOSIS — I10 Essential (primary) hypertension: Secondary | ICD-10-CM | POA: Diagnosis not present

## 2021-10-16 DIAGNOSIS — F320 Major depressive disorder, single episode, mild: Secondary | ICD-10-CM | POA: Diagnosis not present

## 2021-10-16 DIAGNOSIS — G309 Alzheimer's disease, unspecified: Secondary | ICD-10-CM | POA: Diagnosis not present

## 2021-10-16 DIAGNOSIS — F0280 Dementia in other diseases classified elsewhere without behavioral disturbance: Secondary | ICD-10-CM

## 2021-11-09 ENCOUNTER — Ambulatory Visit (INDEPENDENT_AMBULATORY_CARE_PROVIDER_SITE_OTHER): Payer: PPO

## 2021-11-09 ENCOUNTER — Ambulatory Visit
Admission: EM | Admit: 2021-11-09 | Discharge: 2021-11-09 | Disposition: A | Discharge status: Home / Self Care | Payer: PPO | Attending: Emergency Medicine | Admitting: Emergency Medicine

## 2021-11-09 ENCOUNTER — Encounter: Payer: Self-pay | Admitting: Emergency Medicine

## 2021-11-09 DIAGNOSIS — M25532 Pain in left wrist: Secondary | ICD-10-CM

## 2021-11-09 DIAGNOSIS — M67432 Ganglion, left wrist: Secondary | ICD-10-CM

## 2021-11-09 NOTE — Discharge Instructions (Addendum)
Your x-ray was negative for fracture.  This appears to be a ganglion cyst.  Ace wrap, Tylenol and ibuprofen together 3-4 times a day as needed for pain.  Follow-up with hand if this gets worse.

## 2021-11-09 NOTE — ED Triage Notes (Signed)
Patient c/o LFT hand pain x 1 week.  Patient denies fall or trauma.   Patient Patient endorses increased pain " when handing hand down".   Patient hasn't taken any medications for symptoms.

## 2021-11-09 NOTE — ED Provider Notes (Signed)
HPI  SUBJECTIVE:  Jessica Hardin is a right-handed 81 y.o. female who presents with 1 week of a mass on the dorsum of her left wrist that is getting bigger.  No trauma to the area, overlying erythema, distal numbness or tingling.  She states that her fingers "feel stiff".  No limitation of motion of the wrist.  No recent IV/blood draw.  She has never had symptoms like this before.  She tried an Ace wrap with improvement in her symptoms.  Symptoms worse with wrist movement.  She has a past medical history of hypertension, osteopenia, and states that her "memory is not good".  No history of chronic kidney disease, diabetes, DVT, PE, cancer.  PCP: [facility]    Past Medical History:  Diagnosis Date   Allergy    Anxiety    Asthma    GERD (gastroesophageal reflux disease)    History of chicken pox    IBS (irritable bowel syndrome)    Migraine    Urinary tract infection     Past Surgical History:  Procedure Laterality Date   ABDOMINAL HYSTERECTOMY     CATARACT EXTRACTION, BILATERAL     TUBAL LIGATION      Family History  Problem Relation Age of Onset   Arthritis Mother    Hypertension Mother    Arthritis Sister    Hypertension Sister    Colon cancer Brother 31   Breast cancer Sister    Breast cancer Sister    Uterine cancer Sister 78   Multiple sclerosis Daughter     Social History   Tobacco Use   Smoking status: Never   Smokeless tobacco: Never  Vaping Use   Vaping Use: Never used  Substance Use Topics   Alcohol use: No    Alcohol/week: 0.0 standard drinks   Drug use: No    No current facility-administered medications for this encounter.  Current Outpatient Medications:    amLODipine (NORVASC) 2.5 MG tablet, Take 2 tablets by mouth once daily, Disp: 180 tablet, Rfl: 3   mirtazapine (REMERON) 15 MG tablet, Take 15 mg by mouth at bedtime., Disp: , Rfl:    latanoprost (XALATAN) 0.005 % ophthalmic solution, Place 1 drop into both eyes at bedtime., Disp: , Rfl:     Multiple Vitamin (MULTIVITAMIN) capsule, Take 1 capsule by mouth daily., Disp: , Rfl:    venlafaxine XR (EFFEXOR-XR) 37.5 MG 24 hr capsule, Take 1 capsule (37.5 mg total) by mouth daily with breakfast., Disp: 30 capsule, Rfl: 5  Allergies  Allergen Reactions   Shellfish Allergy Hives and Other (See Comments)    CHEST PAIN AND ITCHINESS (INTERNAL--MOUTH THRU GI TRACT)  WITH NAUSEA     ROS  As noted in HPI.   Physical Exam  BP 134/81   Pulse 82   Temp 98.1 F (36.7 C)   Resp 18   SpO2 95%   Constitutional: Well developed, well nourished, no acute distress Eyes:  EOMI, conjunctiva normal bilaterally HENT: Normocephalic, atraumatic,mucus membranes moist Respiratory: Normal inspiratory effort Cardiovascular: Normal rate GI: nondistended skin: No rash, skin intact Musculoskeletal: Tender mobile mass dorsum mid left wrist underlying a vein.  No tender palpable cord in the vein.  No other tenderness over the wrist, hand.  RP 2+.  Sensation and motor intact in the median/radial/ulnar distribution.  Mild pain with flexion extension, no pain with radial/ulnar deviation, supination, pronation. Neurologic: Alert & oriented x 3, no focal neuro deficits Psychiatric: Speech and behavior appropriate   ED  Course   Medications - No data to display  Orders Placed This Encounter  Procedures   DG Wrist Complete Left    Standing Status:   Standing    Number of Occurrences:   1    Order Specific Question:   Reason for Exam (SYMPTOM  OR DIAGNOSIS REQUIRED)    Answer:   tender mass mid dorsum wrist rule out any acute changes   Apply ace wrap    Standing Status:   Standing    Number of Occurrences:   1    No results found for this or any previous visit (from the past 24 hour(s)). DG Wrist Complete Left  Result Date: 11/09/2021 CLINICAL DATA:  Wrist pain. EXAM: LEFT WRIST - COMPLETE 3+ VIEW COMPARISON:  None Available. FINDINGS: No distal radius or ulnar fracture. Radiocarpal joint is  intact. No carpal fracture. Mild sclerosis of the articular surfaces. No soft tissue abnormality. IMPRESSION: No fracture or dislocation. No acute osseous abnormality. Electronically Signed   By: Suzy Bouchard M.D.   On: 11/09/2021 12:19    ED Clinical Impression  1. Ganglion cyst of dorsum of left wrist   2. Acute pain of left wrist      ED Assessment/Plan  Patient has a past medical history of osteopenia and states that her memory is poor, so  will check a wrist x-ray to rule out any acute changes.  Suspect ganglion cyst.  Home with Ace wrap Tylenol ibuprofen, follow-up with [facility] or Dr. Jackqulyn Livings, hand specialist, if this does not get any better.    Reviewed imaging independently.  No acute changes.  See radiology report for full details.  Presentation consistent with ganglion cyst.  X-ray is normal.  Plan as above  Discussed imaging, MDM, treatment plan, and plan for follow-up with patient. patient agrees with plan.   No orders of the defined types were placed in this encounter.     *This clinic note was created using Dragon dictation software. Therefore, there may be occasional mistakes despite careful proofreading.  ?    Melynda Ripple, MD 11/11/21 [phone]

## 2021-12-16 ENCOUNTER — Encounter: Payer: Self-pay | Admitting: Family Medicine

## 2021-12-16 ENCOUNTER — Ambulatory Visit (INDEPENDENT_AMBULATORY_CARE_PROVIDER_SITE_OTHER): Payer: PPO | Admitting: Family Medicine

## 2021-12-16 VITALS — BP 110/70 | HR 80 | Temp 98.5°F | Ht <= 58 in | Wt 138.3 lb

## 2021-12-16 DIAGNOSIS — L299 Pruritus, unspecified: Secondary | ICD-10-CM | POA: Diagnosis not present

## 2021-12-16 LAB — CBC WITH DIFFERENTIAL/PLATELET
Basophils Absolute: 0 10*3/uL (ref 0.0–0.1)
Basophils Relative: 0.5 % (ref 0.0–3.0)
Eosinophils Absolute: 0.1 10*3/uL (ref 0.0–0.7)
Eosinophils Relative: 1.9 % (ref 0.0–5.0)
HCT: 37.8 % (ref 36.0–46.0)
Hemoglobin: 12 g/dL (ref 12.0–15.0)
Lymphocytes Relative: 41.5 % (ref 12.0–46.0)
Lymphs Abs: 1.2 10*3/uL (ref 0.7–4.0)
MCHC: 31.8 g/dL (ref 30.0–36.0)
MCV: 95.2 fl (ref 78.0–100.0)
Monocytes Absolute: 0.3 10*3/uL (ref 0.1–1.0)
Monocytes Relative: 9.1 % (ref 3.0–12.0)
Neutro Abs: 1.4 10*3/uL (ref 1.4–7.7)
Neutrophils Relative %: 47 % (ref 43.0–77.0)
Platelets: 183 10*3/uL (ref 150.0–400.0)
RBC: 3.97 Mil/uL (ref 3.87–5.11)
RDW: 13.7 % (ref 11.5–15.5)
WBC: 3 10*3/uL — ABNORMAL LOW (ref 4.0–10.5)

## 2021-12-16 LAB — COMPREHENSIVE METABOLIC PANEL
ALT: 13 U/L (ref 0–35)
AST: 19 U/L (ref 0–37)
Albumin: 3.8 g/dL (ref 3.5–5.2)
Alkaline Phosphatase: 71 U/L (ref 39–117)
BUN: 10 mg/dL (ref 6–23)
CO2: 32 mEq/L (ref 19–32)
Calcium: 9 mg/dL (ref 8.4–10.5)
Chloride: 106 mEq/L (ref 96–112)
Creatinine, Ser: 1.02 mg/dL (ref 0.40–1.20)
GFR: 51.32 mL/min — ABNORMAL LOW (ref >60.00)
Glucose, Bld: 116 mg/dL — ABNORMAL HIGH (ref 70–99)
Potassium: 3.7 mEq/L (ref 3.5–5.1)
Sodium: 142 mEq/L (ref 135–145)
Total Bilirubin: 0.4 mg/dL (ref 0.2–1.2)
Total Protein: 6.8 g/dL (ref 6.0–8.3)

## 2021-12-16 LAB — TSH: TSH: 1.52 u[IU]/mL (ref 0.35–5.50)

## 2021-12-16 NOTE — Assessment & Plan Note (Signed)
Acute, no clear etiology.  No new exposures.  No medication changes.  No rash.  She does have dry skin and I encouraged her to use moisturizing cream as opposed to lotion.  I will check labs to look for secondary causes including kidney, liver and thyroid disease.

## 2021-12-16 NOTE — Progress Notes (Signed)
Patient ID: Jessica Hardin, female    DOB: 07/01/1940, 81 y.o.   MRN: [id number]  This visit was conducted in person.  Pulse 80   Temp 98.5 F (36.9 C) (Oral)   Ht '4\' 10"'$  (1.473 m)   Wt 138 lb 5 oz (62.7 kg)   SpO2 95%   BMI 28.91 kg/m    BP Readings from Last 3 Encounters:  12/16/21 110/70  11/09/21 134/81  09/09/21 118/64    CC:  Chief Complaint  Patient presents with   Itching All Over    Subjective:   HPI: Jessica Hardin is a 81 y.o. female  with history of GAD presenting on 12/16/2021 for Itching All Over   She has noted itching all over x 2 weeks. No rash, no skin changes. Most on torso.   None on palms and soles.  No new medications. No  new OTC No new exposures, ie new soaps, detergents, no food.  No trips.  No one else in house with itching, rash.   She has tried using lotion       Relevant past medical, surgical, family and social history reviewed and updated as indicated. Interim medical history since our last visit reviewed. Allergies and medications reviewed and updated. Outpatient Medications Prior to Visit  Medication Sig Dispense Refill   amLODipine (NORVASC) 2.5 MG tablet Take 2 tablets by mouth once daily 180 tablet 3   latanoprost (XALATAN) 0.005 % ophthalmic solution Place 1 drop into both eyes at bedtime.     mirtazapine (REMERON) 15 MG tablet Take 15 mg by mouth at bedtime.     Multiple Vitamin (MULTIVITAMIN) capsule Take 1 capsule by mouth daily.     venlafaxine XR (EFFEXOR-XR) 37.5 MG 24 hr capsule Take 1 capsule (37.5 mg total) by mouth daily with breakfast. 30 capsule 5   No facility-administered medications prior to visit.     Per HPI unless specifically indicated in ROS section below Review of Systems  Constitutional:  Negative for fatigue and fever.  HENT:  Negative for congestion.   Eyes:  Negative for pain.  Respiratory:  Negative for cough and shortness of breath.   Cardiovascular:  Negative for chest pain,  palpitations and leg swelling.  Gastrointestinal:  Negative for abdominal pain.  Genitourinary:  Negative for dysuria and vaginal bleeding.  Musculoskeletal:  Negative for back pain.  Neurological:  Negative for syncope, light-headedness and headaches.  Psychiatric/Behavioral:  Negative for dysphoric mood.    Objective:  Pulse 80   Temp 98.5 F (36.9 C) (Oral)   Ht '4\' 10"'$  (1.473 m)   Wt 138 lb 5 oz (62.7 kg)   SpO2 95%   BMI 28.91 kg/m   Wt Readings from Last 3 Encounters:  12/16/21 138 lb 5 oz (62.7 kg)  09/09/21 134 lb (60.8 kg)  08/18/21 135 lb 3.2 oz (61.3 kg)      Physical Exam Constitutional:      General: She is not in acute distress.    Appearance: Normal appearance. She is well-developed. She is not ill-appearing or toxic-appearing.  HENT:     Head: Normocephalic.     Right Ear: Hearing, tympanic membrane, ear canal and external ear normal. Tympanic membrane is not erythematous, retracted or bulging.     Left Ear: Hearing, tympanic membrane, ear canal and external ear normal. Tympanic membrane is not erythematous, retracted or bulging.     Nose: No mucosal edema or rhinorrhea.     Right Sinus: No  maxillary sinus tenderness or frontal sinus tenderness.     Left Sinus: No maxillary sinus tenderness or frontal sinus tenderness.     Mouth/Throat:     Pharynx: Uvula midline.  Eyes:     General: Lids are normal. Lids are everted, no foreign bodies appreciated.     Conjunctiva/sclera: Conjunctivae normal.     Pupils: Pupils are equal, round, and reactive to light.  Neck:     Thyroid: No thyroid mass or thyromegaly.     Vascular: No carotid bruit.     Trachea: Trachea normal.  Cardiovascular:     Rate and Rhythm: Normal rate and regular rhythm.     Pulses: Normal pulses.     Heart sounds: Normal heart sounds, S1 normal and S2 normal. No murmur heard.    No friction rub. No gallop.  Pulmonary:     Effort: Pulmonary effort is normal. No tachypnea or respiratory  distress.     Breath sounds: Normal breath sounds. No decreased breath sounds, wheezing, rhonchi or rales.  Abdominal:     General: Bowel sounds are normal.     Palpations: Abdomen is soft.     Tenderness: There is no abdominal tenderness.  Musculoskeletal:     Cervical back: Normal range of motion and neck supple.  Skin:    General: Skin is warm and dry.     Findings: No rash.  Neurological:     Mental Status: She is alert.  Psychiatric:        Mood and Affect: Mood is not anxious or depressed.        Speech: Speech normal.        Behavior: Behavior normal. Behavior is cooperative.        Thought Content: Thought content normal.        Judgment: Judgment normal.       Results for orders placed or performed during the hospital encounter of 16/10/96  Basic metabolic panel  Result Value Ref Range   Sodium 141 135 - 145 mmol/L   Potassium 3.8 3.5 - 5.1 mmol/L   Chloride 105 98 - 111 mmol/L   CO2 27 22 - 32 mmol/L   Glucose, Bld 116 (H) 70 - 99 mg/dL   BUN 12 8 - 23 mg/dL   Creatinine, Ser 0.98 0.44 - 1.00 mg/dL   Calcium 9.2 8.9 - 10.3 mg/dL   GFR, Estimated 58 (L) >60 mL/min   Anion gap 9 5 - 15  CBC  Result Value Ref Range   WBC 3.7 (L) 4.0 - 10.5 K/uL   RBC 4.21 3.87 - 5.11 MIL/uL   Hemoglobin 12.3 12.0 - 15.0 g/dL   HCT 39.6 36.0 - 46.0 %   MCV 94.1 80.0 - 100.0 fL   MCH 29.2 26.0 - 34.0 pg   MCHC 31.1 30.0 - 36.0 g/dL   RDW 13.2 11.5 - 15.5 %   Platelets 190 150 - 400 K/uL   nRBC 0.0 0.0 - 0.2 %  Troponin I (High Sensitivity)  Result Value Ref Range   Troponin I (High Sensitivity) 9 <18 ng/L  Troponin I (High Sensitivity)  Result Value Ref Range   Troponin I (High Sensitivity) 9 <18 ng/L     COVID 19 screen:  No recent travel or known exposure to COVID19 The patient denies respiratory symptoms of COVID 19 at this time. The importance of social distancing was discussed today.   Assessment and Plan Problem List Items Addressed This Visit     Itching -  Primary    Acute, no clear etiology.  No new exposures.  No medication changes.  No rash.  She does have dry skin and I encouraged her to use moisturizing cream as opposed to lotion.  I will check labs to look for secondary causes including kidney, liver and thyroid disease.      Relevant Orders   Comprehensive metabolic panel   CBC with Differential/Platelet   TSH       Eliezer Lofts, MD

## 2021-12-16 NOTE — Patient Instructions (Signed)
  Please stop at the lab to have labs drawn. Increase water intake . Start a moisturizing cream (not lotion).. no dyes or fragrances such as  Cetaphil, Aveeno. Change to hypoallergenic detergent and soap.

## 2021-12-29 ENCOUNTER — Other Ambulatory Visit: Payer: PPO

## 2022-01-03 ENCOUNTER — Ambulatory Visit (INDEPENDENT_AMBULATORY_CARE_PROVIDER_SITE_OTHER): Payer: PPO

## 2022-01-03 ENCOUNTER — Encounter: Payer: PPO | Admitting: Family Medicine

## 2022-01-03 VITALS — Ht <= 58 in | Wt 138.0 lb

## 2022-01-03 DIAGNOSIS — Z0000 Encounter for general adult medical examination without abnormal findings: Secondary | ICD-10-CM | POA: Diagnosis not present

## 2022-01-03 NOTE — Patient Instructions (Addendum)
Jessica Hardin , Thank you for taking time to come for your Medicare Wellness Visit. I appreciate your ongoing commitment to your health goals. Please review the following plan we discussed and let me know if I can assist you in the future.   These are the goals we discussed:  Goals       Manage My Medicine      Timeframe:  Long-Range Goal Priority:  High Start Date:      07/14/21                       Expected End Date:  07/14/22                     Follow Up Date April 2023   - call for medicine refill 2 or 3 days before it runs out - call if I am sick and can't take my medicine - keep a list of all the medicines I take; vitamins and herbals too -Utilize [facility] for medication synchronization, packaging and delivery    Why is this important?   These steps will help you keep on track with your medicines.   Notes:       No Current goals (pt-stated)      sleep management      Starting 11/04/2015, I will continue to do deep breathing exercises whenever I feel stressed and when I unable to sleep at night.         This is a list of the screening recommended for you and due dates:  Health Maintenance  Topic Date Due   COVID-19 Vaccine (4 - Pfizer series) 01/19/2022*   Zoster (Shingles) Vaccine (1 of 2) 04/05/2022*   DEXA scan (bone density measurement)  01/04/2023*   Tetanus Vaccine  01/04/2023*   Flu Shot  01/17/2022   Mammogram  04/28/2022   Pneumonia Vaccine  Completed   HPV Vaccine  Aged Out  *Topic was postponed. The date shown is not the original due date.    Advanced directives: No  Conditions/risks identified: None  Next appointment: Follow up in one year for your annual wellness visit     Preventive Care 65 Years and Older, Female Preventive care refers to lifestyle choices and visits with your health care provider that can promote health and wellness. What does preventive care include? A yearly physical exam. This is also called an annual well  check. Dental exams once or twice a year. Routine eye exams. Ask your health care provider how often you should have your eyes checked. Personal lifestyle choices, including: Daily care of your teeth and gums. Regular physical activity. Eating a healthy diet. Avoiding tobacco and drug use. Limiting alcohol use. Practicing safe sex. Taking low-dose aspirin every day. Taking vitamin and mineral supplements as recommended by your health care provider. What happens during an annual well check? The services and screenings done by your health care provider during your annual well check will depend on your age, overall health, lifestyle risk factors, and family history of disease. Counseling  Your health care provider may ask you questions about your: Alcohol use. Tobacco use. Drug use. Emotional well-being. Home and relationship well-being. Sexual activity. Eating habits. History of falls. Memory and ability to understand (cognition). Work and work Statistician. Reproductive health. Screening  You may have the following tests or measurements: Height, weight, and BMI. Blood pressure. Lipid and cholesterol levels. These may be checked every 5 years, or more frequently if you  are over 31 years old. Skin check. Lung cancer screening. You may have this screening every year starting at age 50 if you have a 30-pack-year history of smoking and currently smoke or have quit within the past 15 years. Fecal occult blood test (FOBT) of the stool. You may have this test every year starting at age 71. Flexible sigmoidoscopy or colonoscopy. You may have a sigmoidoscopy every 5 years or a colonoscopy every 10 years starting at age 17. Hepatitis C blood test. Hepatitis B blood test. Sexually transmitted disease (STD) testing. Diabetes screening. This is done by checking your blood sugar (glucose) after you have not eaten for a while (fasting). You may have this done every 1-3 years. Bone density scan.  This is done to screen for osteoporosis. You may have this done starting at age 2. Mammogram. This may be done every 1-2 years. Talk to your health care provider about how often you should have regular mammograms. Talk with your health care provider about your test results, treatment options, and if necessary, the need for more tests. Vaccines  Your health care provider may recommend certain vaccines, such as: Influenza vaccine. This is recommended every year. Tetanus, diphtheria, and acellular pertussis (Tdap, Td) vaccine. You may need a Td booster every 10 years. Zoster vaccine. You may need this after age 77. Pneumococcal 13-valent conjugate (PCV13) vaccine. One dose is recommended after age 7. Pneumococcal polysaccharide (PPSV23) vaccine. One dose is recommended after age 3. Talk to your health care provider about which screenings and vaccines you need and how often you need them. This information is not intended to replace advice given to you by your health care provider. Make sure you discuss any questions you have with your health care provider. Document Released: 07/02/2015 Document Revised: 02/23/2016 Document Reviewed: 04/06/2015 Elsevier Interactive Patient Education  2017 [place] Prevention in the Home Falls can cause injuries. They can happen to people of all ages. There are many things you can do to make your home safe and to help prevent falls. What can I do on the outside of my home? Regularly fix the edges of walkways and driveways and fix any cracks. Remove anything that might make you trip as you walk through a door, such as a raised step or threshold. Trim any bushes or trees on the path to your home. Use bright outdoor lighting. Clear any walking paths of anything that might make someone trip, such as rocks or tools. Regularly check to see if handrails are loose or broken. Make sure that both sides of any steps have handrails. Any raised decks and porches  should have guardrails on the edges. Have any leaves, snow, or ice cleared regularly. Use sand or salt on walking paths during winter. Clean up any spills in your garage right away. This includes oil or grease spills. What can I do in the bathroom? Use night lights. Install grab bars by the toilet and in the tub and shower. Do not use towel bars as grab bars. Use non-skid mats or decals in the tub or shower. If you need to sit down in the shower, use a plastic, non-slip stool. Keep the floor dry. Clean up any water that spills on the floor as soon as it happens. Remove soap buildup in the tub or shower regularly. Attach bath mats securely with double-sided non-slip rug tape. Do not have throw rugs and other things on the floor that can make you trip. What can I do in  the bedroom? Use night lights. Make sure that you have a light by your bed that is easy to reach. Do not use any sheets or blankets that are too big for your bed. They should not hang down onto the floor. Have a firm chair that has side arms. You can use this for support while you get dressed. Do not have throw rugs and other things on the floor that can make you trip. What can I do in the kitchen? Clean up any spills right away. Avoid walking on wet floors. Keep items that you use a lot in easy-to-reach places. If you need to reach something above you, use a strong step stool that has a grab bar. Keep electrical cords out of the way. Do not use floor polish or wax that makes floors slippery. If you must use wax, use non-skid floor wax. Do not have throw rugs and other things on the floor that can make you trip. What can I do with my stairs? Do not leave any items on the stairs. Make sure that there are handrails on both sides of the stairs and use them. Fix handrails that are broken or loose. Make sure that handrails are as long as the stairways. Check any carpeting to make sure that it is firmly attached to the stairs.  Fix any carpet that is loose or worn. Avoid having throw rugs at the top or bottom of the stairs. If you do have throw rugs, attach them to the floor with carpet tape. Make sure that you have a light switch at the top of the stairs and the bottom of the stairs. If you do not have them, ask someone to add them for you. What else can I do to help prevent falls? Wear shoes that: Do not have high heels. Have rubber bottoms. Are comfortable and fit you well. Are closed at the toe. Do not wear sandals. If you use a stepladder: Make sure that it is fully opened. Do not climb a closed stepladder. Make sure that both sides of the stepladder are locked into place. Ask someone to hold it for you, if possible. Clearly mark and make sure that you can see: Any grab bars or handrails. First and last steps. Where the edge of each step is. Use tools that help you move around (mobility aids) if they are needed. These include: Canes. Walkers. Scooters. Crutches. Turn on the lights when you go into a dark area. Replace any light bulbs as soon as they burn out. Set up your furniture so you have a clear path. Avoid moving your furniture around. If any of your floors are uneven, fix them. If there are any pets around you, be aware of where they are. Review your medicines with your doctor. Some medicines can make you feel dizzy. This can increase your chance of falling. Ask your doctor what other things that you can do to help prevent falls. This information is not intended to replace advice given to you by your health care provider. Make sure you discuss any questions you have with your health care provider. Document Released: 04/01/2009 Document Revised: 11/11/2015 Document Reviewed: 07/10/2014 Elsevier Interactive Patient Education  2017 [employer].

## 2022-01-03 NOTE — Progress Notes (Signed)
Subjective:   Jessica Hardin is a 81 y.o. female who presents for Medicare Annual (Subsequent) preventive examination.  Review of Systems    Virtual Visit via Telephone Note  I connected with  Jessica Hardin on 01/03/22 at  3:00 PM EDT by telephone and verified that I am speaking with the correct person using two identifiers.  Location: Patient: Home Provider: Office Persons participating in the virtual visit: patient/Nurse Health Advisor   I discussed the limitations, risks, security and privacy concerns of performing an evaluation and management service by telephone and the availability of in person appointments. The patient expressed understanding and agreed to proceed.  Interactive audio and video telecommunications were attempted between this nurse and patient, however failed, due to patient having technical difficulties OR patient did not have access to video capability.  We continued and completed visit with audio only.  Some vital signs may be absent or patient reported.   Jessica Peaches, LPN  Cardiac Risk Factors include: advanced age (>37mn, >>90women);hypertension     Objective:    Today's Vitals   01/03/22 1500  Weight: 138 lb (62.6 kg)  Height: '4\' 10"'$  (1.473 m)   Body mass index is 28.84 kg/m.     01/03/2022    3:11 PM 08/11/2021    3:26 PM 01/18/2021    7:41 PM 04/20/2020   11:11 AM 02/04/2018    5:59 PM 10/27/2017    6:31 PM 04/27/2017    8:21 PM  Advanced Directives  Does Patient Have a Medical Advance Directive? No Yes No No No No No  Type of ASocial research officer, governmentLiving will       Does patient want to make changes to medical advance directive? No - Patient declined        Would patient like information on creating a medical advance directive? No - Patient declined    No - Patient declined  No - Patient declined    Current Medications (verified) Outpatient Encounter Medications as of 01/03/2022  Medication Sig   amLODipine  (NORVASC) 2.5 MG tablet Take 2 tablets by mouth once daily   latanoprost (XALATAN) 0.005 % ophthalmic solution Place 1 drop into both eyes at bedtime.   mirtazapine (REMERON) 15 MG tablet Take 15 mg by mouth at bedtime.   Multiple Vitamin (MULTIVITAMIN) capsule Take 1 capsule by mouth daily.   venlafaxine XR (EFFEXOR-XR) 37.5 MG 24 hr capsule Take 1 capsule (37.5 mg total) by mouth daily with breakfast.   No facility-administered encounter medications on file as of 01/03/2022.    Allergies (verified) Shellfish allergy   History: Past Medical History:  Diagnosis Date   Allergy    Anxiety    Asthma    GERD (gastroesophageal reflux disease)    History of chicken pox    IBS (irritable bowel syndrome)    Migraine    Urinary tract infection    Past Surgical History:  Procedure Laterality Date   ABDOMINAL HYSTERECTOMY     CATARACT EXTRACTION, BILATERAL     TUBAL LIGATION     Family History  Problem Relation Age of Onset   Arthritis Mother    Hypertension Mother    Arthritis Sister    Hypertension Sister    Colon cancer Brother [phone]  Breast cancer Sister    Breast cancer Sister    Uterine cancer Sister [phone]  Multiple sclerosis Daughter    Social History   Socioeconomic History   Marital  status: Married    Spouse name: Not on file   Number of children: 3   Years of education: Not on file   Highest education level: Associate degree: occupational, Hotel manager, or vocational program  Occupational History   Not on file  Tobacco Use   Smoking status: Never   Smokeless tobacco: Never  Vaping Use   Vaping Use: Never used  Substance and Sexual Activity   Alcohol use: No    Alcohol/week: 0.0 standard drinks of alcohol   Drug use: No   Sexual activity: Yes    Birth control/protection: Surgical  Other Topics Concern   Not on file  Social History Narrative   Lives at home with her husband   Right handed   Caffeine: none   Social Determinants of Health   Financial  Resource Strain: Low Risk  (01/03/2022)   Overall Financial Resource Strain (CARDIA)    Difficulty of Paying Living Expenses: Not hard at all  Food Insecurity: No Food Insecurity (01/03/2022)   Hunger Vital Sign    Worried About Running Out of Food in the Last Year: Never true    [place] in the Last Year: Never true  Transportation Needs: No Transportation Needs (01/03/2022)   PRAPARE - Hydrologist (Medical): No    Lack of Transportation (Non-Medical): No  Physical Activity: Insufficiently Active (01/03/2022)   Exercise Vital Sign    Days of Exercise per Week: 2 days    Minutes of Exercise per Session: 20 min  Stress: No Stress Concern Present (01/03/2022)   [place]    Feeling of Stress : Not at all  Social Connections: Home (01/03/2022)   Social Connection and Isolation Panel [NHANES]    Frequency of Communication with Friends and Family: More than three times a week    Frequency of Social Gatherings with Friends and Family: More than three times a week    Attends Religious Services: More than 4 times per year    Active Member of [employer] or Organizations: Yes    Attends Music therapist: More than 4 times per year    Marital Status: Married    Tobacco Counseling Counseling given: Not Answered   Clinical Intake:   Diabetic? No Information entered by :: Jessica Arbour LPN   Activities of Daily Living    01/03/2022    3:08 PM  In your present state of health, do you have any difficulty performing the following activities:  Hearing? 0  Vision? 0  Difficulty concentrating or making decisions? 1  Comment Husband assist  Walking or climbing stairs? 0  Dressing or bathing? 0  Doing errands, shopping? 0  Preparing Food and eating ? N  Using the Toilet? N  In the past six months, have you accidently leaked urine? N  Do you have problems with loss of  bowel control? N  Managing your Medications? N  Managing your Finances? N  Housekeeping or managing your Housekeeping? N    Patient Care Team: Jinny Sanders, MD as PCP - General (Family Medicine) Defrancesco, Alanda Slim, MD as Referring Physician (Obstetrics and Gynecology) Bary Castilla Forest Gleason, MD (General Surgery) Estill Cotta, MD as Referring Physician (Ophthalmology) Charlton Haws, [facility] as Pharmacist (Pharmacist)  Indicate any recent Medical Services you may have received from other than [facility] providers in the past year (date may be approximate).     Assessment:   This is  a routine wellness examination for [place].  Hearing/Vision screen Hearing Screening - Comments:: No hearing difficulty Vision Screening - Comments:: Wears glasses. Followed by Dr Jeni Salles  Dietary issues and exercise activities discussed: Exercise limited by: None identified   Goals Addressed               This Visit's Progress     No Current goals (pt-stated)         Depression Screen    01/03/2022    3:06 PM 09/09/2021    9:51 AM 12/31/2020    3:25 PM 12/16/2019   12:16 PM 09/16/2019    1:05 PM 08/19/2019    9:13 AM 07/01/2019    9:38 AM  PHQ 2/9 Scores  PHQ - 2 Score 0 2 0 '1 3 4 2  '$ PHQ- 9 Score  '5 2 9 13 11 8    '$ Fall Risk    01/03/2022    3:10 PM 12/31/2020    3:25 PM 12/16/2019   11:21 AM 12/03/2018   11:25 AM 01/15/2018    8:32 AM  Fall Risk   Falls in the past year? 0 0 0 0 No  Comment     pt does not remember any falls but says she might have  Number falls in past yr: 0 0  0   Injury with Fall? 0 0  0   Risk for fall due to : No Fall Risks    Impaired balance/gait    FALL RISK PREVENTION PERTAINING TO THE HOME:  Any stairs in or around the home? Yes  If so, are there any without handrails? No  Home free of loose throw rugs in walkways, pet beds, electrical cords, etc? Yes  Adequate lighting in your home to reduce risk of falls? Yes   ASSISTIVE DEVICES UTILIZED TO  PREVENT FALLS:  Life alert? No  Use of a cane, walker or w/c? No  Grab bars in the bathroom? No  Shower chair or bench in shower? No  Elevated toilet seat or a handicapped toilet? No   TIMED UP AND GO:  Was the test performed? No . Audio Visit  Cognitive Function:    06/13/2017   11:05 AM 11/04/2015    1:40 PM  MMSE - Mini Mental State Exam  Orientation to time 5 5  Orientation to Place 5 5  Registration 3 3  Attention/ Calculation 5 0  Recall 2 3  Language- name 2 objects 2 0  Language- repeat 1 1  Language- follow 3 step command 3 3  Language- read & follow direction 1 0  Write a sentence 1 0  Copy design 1 0  Total score 29 20        01/03/2022    3:11 PM  6CIT Screen  What Year? 4 points  What month? 3 points  What time? 0 points  Count back from 20 0 points  Months in reverse 0 points  Repeat phrase 4 points  Total Score 11 points    Immunizations Immunization History  Administered Date(s) Administered   PFIZER(Purple Top)SARS-COV-2 Vaccination 09/06/2019, 09/30/2019, 06/05/2020   PNEUMOCOCCAL CONJUGATE-20 12/31/2020       Pneumococcal vaccine status: Completed during today's visit.  Covid-19 vaccine status: Completed vaccines  Qualifies for Shingles Vaccine? Yes   Zostavax completed No   Shingrix Completed?: No.    Education has been provided regarding the importance of this vaccine. Patient has been advised to call insurance company to determine out of pocket expense if  they have not yet received this vaccine. Advised may also receive vaccine at local pharmacy or Health Dept. Verbalized acceptance and understanding.  Screening Tests Health Maintenance  Topic Date Due   COVID-19 Vaccine (4 - Pfizer series) 01/19/2022 (Originally 07/31/2020)   Zoster Vaccines- Shingrix (1 of 2) 04/05/2022 (Originally 09/07/1989)   DEXA SCAN  01/04/2023 (Originally 11/17/2020)   TETANUS/TDAP  01/04/2023 (Originally 09/08/1958)   INFLUENZA VACCINE  01/17/2022    MAMMOGRAM  04/28/2022   Pneumonia Vaccine 22+ Years old  Completed   HPV VACCINES  Aged Out    Health Maintenance  There are no preventive care reminders to display for this patient.   Colorectal cancer screening: No longer required.   Mammogram status: No longer required due to Age.    Lung Cancer Screening: (Low Dose CT Chest recommended if Age 74-80 years, 30 pack-year currently smoking OR have quit w/in 15years.) does not qualify.     Additional Screening:  Hepatitis C Screening: does not qualify; Completed   Vision Screening: Recommended annual ophthalmology exams for early detection of glaucoma and other disorders of the eye. Is the patient up to date with their annual eye exam?  Yes  Who is the provider or what is the name of the office in which the patient attends annual eye exams? Dr Jeni Salles If pt is not established with a provider, would they like to be referred to a provider to establish care? No .   Dental Screening: Recommended annual dental exams for proper oral hygiene  Community Resource Referral / Chronic Care Management:  CRR required this visit?  No   CCM required this visit?  No      Plan:     I have personally reviewed and noted the following in the patient's chart:   Medical and social history Use of alcohol, tobacco or illicit drugs  Current medications and supplements including opioid prescriptions.  Functional ability and status Nutritional status Physical activity Advanced directives List of other physicians Hospitalizations, surgeries, and ER visits in previous 12 months Vitals Screenings to include cognitive, depression, and falls Referrals and appointments  In addition, I have reviewed and discussed with patient certain preventive protocols, quality metrics, and best practice recommendations. A written personalized care plan for preventive services as well as general preventive health recommendations were provided to patient.      Jessica Peaches, LPN   06/19/7508   Nurse Notes: None

## 2022-01-31 DIAGNOSIS — H40003 Preglaucoma, unspecified, bilateral: Secondary | ICD-10-CM | POA: Diagnosis not present

## 2022-02-21 ENCOUNTER — Telehealth: Payer: Self-pay | Admitting: Family Medicine

## 2022-02-21 MED ORDER — VENLAFAXINE HCL ER 37.5 MG PO CP24: 37.5000 mg | ORAL_CAPSULE | Freq: Every day | ORAL | 5 refills | Status: DC

## 2022-02-21 NOTE — Telephone Encounter (Signed)
  Encourage patient to contact the pharmacy for refills or they can request refills through [facility]  Did the patient contact the pharmacy:  yes   LAST APPOINTMENT DATE:  Please schedule appointment if longer than 1 year  NEXT APPOINTMENT DATE:04/12/2022  MEDICATION:venlafaxine XR (EFFEXOR-XR) 37.5 MG 24 hr capsule  Is the patient out of medication? no  If not, how much is left?couple days left  Is this a 90 day supply: yes  PHARMACY: [place], [place] Phone:  [phone]  Fax:  [phone]      Let patient know to contact pharmacy at the end of the day to make sure medication is ready.  Please notify patient to allow 48-72 hours to process  CLINICAL FILLS OUT ALL BELOW:

## 2022-02-21 NOTE — Telephone Encounter (Signed)
Refill sent as requested. 

## 2022-02-21 NOTE — Addendum Note (Signed)
Addended by: Carter Kitten on: 02/21/2022 09:56 AM   Modules accepted: Orders

## 2022-02-28 ENCOUNTER — Other Ambulatory Visit: Payer: Self-pay

## 2022-02-28 ENCOUNTER — Emergency Department
Admission: EM | Admit: 2022-02-28 | Discharge: 2022-02-28 | Disposition: A | Discharge status: Home / Self Care | Payer: PPO | Attending: Emergency Medicine | Admitting: Emergency Medicine

## 2022-02-28 DIAGNOSIS — F0390 Unspecified dementia without behavioral disturbance: Secondary | ICD-10-CM | POA: Diagnosis not present

## 2022-02-28 DIAGNOSIS — J45909 Unspecified asthma, uncomplicated: Secondary | ICD-10-CM | POA: Insufficient documentation

## 2022-02-28 DIAGNOSIS — F419 Anxiety disorder, unspecified: Secondary | ICD-10-CM | POA: Insufficient documentation

## 2022-02-28 NOTE — ED Provider Notes (Signed)
[facility] Provider Note    Event Date/Time   First MD Initiated Contact with Patient 02/28/22 1620     (approximate)   History   Chief Complaint Anxiety   HPI  Jessica Hardin is a 81 y.o. female with past medical history of Alzheimer's dementia, asthma, GERD, migraines, and anxiety who presents to the ED complaining of anxiety.  Patient reports that she has been dealing with increasing anxiety and anxiousness for the past few months.  She states that she will frequently feel very shaky when she gets anxious, but she denies any associated chest pain, shortness of breath, lightheadedness, or near syncope.  She is not sure whether she has ever spoken with her primary care doctor about this, does state that she continues to take venlafaxine on a regular basis.  She denies any thoughts of harming herself or others, denies any auditory or visual hallucinations.     Physical Exam   Triage Vital Signs: ED Triage Vitals  Enc Vitals Group     BP 02/28/22 1515 (!) 143/77     Pulse Rate 02/28/22 1515 83     Resp 02/28/22 1515 16     Temp 02/28/22 1515 98.9 F (37.2 C)     Temp Source 02/28/22 1515 Oral     SpO2 02/28/22 1515 98 %     Weight 02/28/22 1510 135 lb (61.2 kg)     Height 02/28/22 1510 '4\' 10"'$  (1.473 m)     Head Circumference --      Peak Flow --      Pain Score 02/28/22 1510 0     Pain Loc --      Pain Edu? --      Excl. in [place]? --     Most recent vital signs: Vitals:   02/28/22 1515  BP: (!) 143/77  Pulse: 83  Resp: 16  Temp: 98.9 F (37.2 C)  SpO2: 98%    Constitutional: Alert and oriented. Eyes: Conjunctivae are normal. Head: Atraumatic. Nose: No congestion/rhinnorhea. Mouth/Throat: Mucous membranes are moist.  Cardiovascular: Normal rate, regular rhythm. Grossly normal heart sounds.  2+ radial pulses bilaterally. Respiratory: Normal respiratory effort.  No retractions. Lungs CTAB. Gastrointestinal: Soft and nontender. No  distention. Musculoskeletal: No lower extremity tenderness nor edema.  Neurologic:  Normal speech and language. No gross focal neurologic deficits are appreciated.    ED Results / Procedures / Treatments   Labs (all labs ordered are listed, but only abnormal results are displayed) Labs Reviewed - No data to display   PROCEDURES:  Critical Care performed: No  Procedures   MEDICATIONS ORDERED IN ED: Medications - No data to display   IMPRESSION / MDM / [place] / ED COURSE  I reviewed the triage vital signs and the nursing notes.                              81 y.o. female with past medical history of Alzheimer's dementia, asthma, GERD, migraines, and anxiety who presents to the ED with increasing anxiousness and feeling shaky when she is anxious for the past couple of months.  Patient's presentation is most consistent with acute, uncomplicated illness.  Differential diagnosis includes, but is not limited to, anxiety, depression, psychosis.  Patient is nontoxic-appearing and in no acute distress, vital signs are unremarkable.  She is currently asymptomatic and denies any chest pain or shortness of breath with her episodes of  anxiety.  She does report feeling shaky at times but does not currently feel shaky or have any other symptoms.  There is no indication for psychiatric evaluation at this time given she denies any suicidal or homicidal ideation.  She would benefit from outpatient psychiatry follow-up as well as PCP follow-up, was provided referral to follow-up with [facility].  Patient and spouse counseled to return to the ED for new or worsening symptoms, patient agrees with plan.      FINAL CLINICAL IMPRESSION(S) / ED DIAGNOSES   Final diagnoses:  Anxiety     Rx / DC Orders   ED Discharge Orders     None        Note:  This document was prepared using Dragon voice recognition software and may include unintentional dictation errors.   Blake Divine,  MD 02/28/22 1714

## 2022-02-28 NOTE — ED Notes (Signed)
53 yof with a c/c of anxiousness for the last couple of months.

## 2022-02-28 NOTE — ED Triage Notes (Signed)
Pt presents to ED from home C/O "anxiety problems." Pt denies CP, SOB. Pt reports anxiety has been going on for "a long time," but she has never seen a dr about it before.

## 2022-03-03 ENCOUNTER — Telehealth: Payer: Self-pay

## 2022-03-03 NOTE — Progress Notes (Signed)
    Chronic Care Management Pharmacy Assistant   Name: Jessica Hardin  MRN: [id number] DOB: 1940/11/17  Reason for Encounter: CCM (Hosptial Follow Up)   Medications: Outpatient Encounter Medications as of 03/03/2022  Medication Sig   amLODipine (NORVASC) 2.5 MG tablet Take 2 tablets by mouth once daily   latanoprost (XALATAN) 0.005 % ophthalmic solution Place 1 drop into both eyes at bedtime.   mirtazapine (REMERON) 15 MG tablet Take 15 mg by mouth at bedtime.   Multiple Vitamin (MULTIVITAMIN) capsule Take 1 capsule by mouth daily.   venlafaxine XR (EFFEXOR-XR) 37.5 MG 24 hr capsule Take 1 capsule (37.5 mg total) by mouth daily with breakfast.   No facility-administered encounter medications on file as of 03/03/2022.   Reviewed hospital notes for details of recent visit. Patient has been contacted by Transitions of Care team: No  Admitted to the ED on 02/28/2022. Discharge date was 02/28/2022.  [facility].   Discharge diagnosis (Principal Problem): Anxiety Patient was discharged to Home  Brief summary of hospital course: 81 y.o. female with past medical history of Alzheimer's dementia, asthma, GERD, migraines, and anxiety who presents to the ED with increasing anxiousness and feeling shaky when she is anxious for the past couple of months.   Patient's presentation is most consistent with acute, uncomplicated illness.   Differential diagnosis includes, but is not limited to, anxiety, depression, psychosis.   Patient is nontoxic-appearing and in no acute distress, vital signs are unremarkable.  She is currently asymptomatic and denies any chest pain or shortness of breath with her episodes of anxiety.  She does report feeling shaky at times but does not currently feel shaky or have any other symptoms.  There is no indication for psychiatric evaluation at this time given she denies any suicidal or homicidal ideation.  She would benefit from outpatient  psychiatry follow-up as well as PCP follow-up, was provided referral to follow-up with [facility].  Patient and spouse counseled to return to the ED for new or worsening symptoms, patient agrees with plan.  Medications that remain the same after Hospital Discharge:??  -All other medications will remain the same.    Next CCM appt: 04/12/2022  Other upcoming appts: PCP appointment on 03/07/2022  Charlene Brooke, PharmD notified and will determine if action is needed.

## 2022-03-07 ENCOUNTER — Encounter: Payer: Self-pay | Admitting: Family Medicine

## 2022-03-07 ENCOUNTER — Ambulatory Visit (INDEPENDENT_AMBULATORY_CARE_PROVIDER_SITE_OTHER): Payer: PPO | Admitting: Family Medicine

## 2022-03-07 VITALS — BP 120/70 | HR 72 | Temp 98.7°F | Ht <= 58 in | Wt 140.5 lb

## 2022-03-07 DIAGNOSIS — G309 Alzheimer's disease, unspecified: Secondary | ICD-10-CM

## 2022-03-07 DIAGNOSIS — F411 Generalized anxiety disorder: Secondary | ICD-10-CM | POA: Diagnosis not present

## 2022-03-07 DIAGNOSIS — F0280 Dementia in other diseases classified elsewhere without behavioral disturbance: Secondary | ICD-10-CM

## 2022-03-07 DIAGNOSIS — F5104 Psychophysiologic insomnia: Secondary | ICD-10-CM

## 2022-03-07 NOTE — Assessment & Plan Note (Signed)
Chronic stable per husband.   Not on memory med as trying to keep medication regimen simple.

## 2022-03-07 NOTE — Assessment & Plan Note (Addendum)
Sleeping very well with  Remeron at night.

## 2022-03-07 NOTE — Patient Instructions (Signed)
Try to do some daily exercise. Find a hobby to keep active during the day.

## 2022-03-07 NOTE — Assessment & Plan Note (Signed)
Chronic... discussed finding hobby, exercise, volunteering etc would likely help her with purpose and excess energy. No clear need for med change... continue low dose venlafaxine 37.5 mg daily and remeron 15 mg at bedtime.

## 2022-03-07 NOTE — Progress Notes (Signed)
Patient ID: Jessica Hardin, female    DOB: 05-Oct-1940, 81 y.o.   MRN: [id number]  This visit was conducted in person.  BP 120/70   Pulse 72   Temp 98.7 F (37.1 C) (Oral)   Ht '4\' 10"'$  (1.473 m)   Wt 140 lb 8 oz (63.7 kg)   SpO2 96%   BMI 29.36 kg/m    CC:  Chief Complaint  Patient presents with   Follow-up    ER Visit 02/28/22 for Anxiety    Subjective:   HPI: Jessica Hardin is a 81 y.o. female with history of Alzheimer's dementia, asthma, GERD, migraines and GAD presenting on 03/07/2022 for Follow-up (ER Visit 02/28/22 for Anxiety)   Reviewed ED visit from February 28, 2022 for shaky feeling and anxiety Normal lab evaluation.  No recommendations given except for follow-up with PCP and psychiatry.  Today she reports she is  " not any better". Feels jiuttery inside. She is eating well and drinking water. Naps a lot during the day... seems to sleep all night per husband who is at appt with her today.  She does not do much other than solitaire during the day. No exercise.  See GAD7 and PHQ9      Relevant past medical, surgical, family and social history reviewed and updated as indicated. Interim medical history since our last visit reviewed. Allergies and medications reviewed and updated. Outpatient Medications Prior to Visit  Medication Sig Dispense Refill   amLODipine (NORVASC) 2.5 MG tablet Take 2 tablets by mouth once daily 180 tablet 3   latanoprost (XALATAN) 0.005 % ophthalmic solution Place 1 drop into both eyes at bedtime.     mirtazapine (REMERON) 15 MG tablet Take 15 mg by mouth at bedtime.     Multiple Vitamin (MULTIVITAMIN) capsule Take 1 capsule by mouth daily.     venlafaxine XR (EFFEXOR-XR) 37.5 MG 24 hr capsule Take 1 capsule (37.5 mg total) by mouth daily with breakfast. 30 capsule 5   No facility-administered medications prior to visit.     Per HPI unless specifically indicated in ROS section below Review of Systems  Constitutional:  Negative  for fatigue and fever.  HENT:  Negative for ear pain.   Eyes:  Negative for pain.  Respiratory:  Negative for chest tightness and shortness of breath.   Cardiovascular:  Negative for chest pain, palpitations and leg swelling.  Gastrointestinal:  Negative for abdominal pain.  Genitourinary:  Negative for dysuria.   Objective:  BP 120/70   Pulse 72   Temp 98.7 F (37.1 C) (Oral)   Ht '4\' 10"'$  (1.473 m)   Wt 140 lb 8 oz (63.7 kg)   SpO2 96%   BMI 29.36 kg/m   Wt Readings from Last 3 Encounters:  03/07/22 140 lb 8 oz (63.7 kg)  02/28/22 135 lb (61.2 kg)  01/03/22 138 lb (62.6 kg)      Physical Exam Constitutional:      General: She is not in acute distress.    Appearance: Normal appearance. She is well-developed. She is not ill-appearing or toxic-appearing.  HENT:     Head: Normocephalic.     Right Ear: Hearing, tympanic membrane, ear canal and external ear normal. Tympanic membrane is not erythematous, retracted or bulging.     Left Ear: Hearing, tympanic membrane, ear canal and external ear normal. Tympanic membrane is not erythematous, retracted or bulging.     Nose: No mucosal edema or rhinorrhea.     Right  Sinus: No maxillary sinus tenderness or frontal sinus tenderness.     Left Sinus: No maxillary sinus tenderness or frontal sinus tenderness.     Mouth/Throat:     Pharynx: Uvula midline.  Eyes:     General: Lids are normal. Lids are everted, no foreign bodies appreciated.     Conjunctiva/sclera: Conjunctivae normal.     Pupils: Pupils are equal, round, and reactive to light.  Neck:     Thyroid: No thyroid mass or thyromegaly.     Vascular: No carotid bruit.     Trachea: Trachea normal.  Cardiovascular:     Rate and Rhythm: Normal rate and regular rhythm.     Pulses: Normal pulses.     Heart sounds: Normal heart sounds, S1 normal and S2 normal. No murmur heard.    No friction rub. No gallop.  Pulmonary:     Effort: Pulmonary effort is normal. No tachypnea or  respiratory distress.     Breath sounds: Normal breath sounds. No decreased breath sounds, wheezing, rhonchi or rales.  Abdominal:     General: Bowel sounds are normal.     Palpations: Abdomen is soft.     Tenderness: There is no abdominal tenderness.  Musculoskeletal:     Cervical back: Normal range of motion and neck supple.  Skin:    General: Skin is warm and dry.     Findings: No rash.  Neurological:     Mental Status: She is alert.  Psychiatric:        Mood and Affect: Mood is not anxious or depressed. Affect is blunt and flat.        Speech: Speech normal.        Behavior: Behavior is withdrawn. Behavior is cooperative.        Thought Content: Thought content normal.        Cognition and Memory: Cognition is impaired. Memory is impaired. She exhibits impaired recent memory and impaired remote memory.        Judgment: Judgment is inappropriate.       Results for orders placed or performed in visit on 12/16/21  Comprehensive metabolic panel  Result Value Ref Range   Sodium 142 135 - 145 mEq/L   Potassium 3.7 3.5 - 5.1 mEq/L   Chloride 106 96 - 112 mEq/L   CO2 32 19 - 32 mEq/L   Glucose, Bld 116 (H) 70 - 99 mg/dL   BUN 10 6 - 23 mg/dL   Creatinine, Ser 1.02 0.40 - 1.20 mg/dL   Total Bilirubin 0.4 0.2 - 1.2 mg/dL   Alkaline Phosphatase 71 39 - 117 U/L   AST 19 0 - 37 U/L   ALT 13 0 - 35 U/L   Total Protein 6.8 6.0 - 8.3 g/dL   Albumin 3.8 3.5 - 5.2 g/dL   GFR 51.32 (L) >60.00 mL/min   Calcium 9.0 8.4 - 10.5 mg/dL  CBC with Differential/Platelet  Result Value Ref Range   WBC 3.0 (L) 4.0 - 10.5 K/uL   RBC 3.97 3.87 - 5.11 Mil/uL   Hemoglobin 12.0 12.0 - 15.0 g/dL   HCT 37.8 36.0 - 46.0 %   MCV 95.2 78.0 - 100.0 fl   MCHC 31.8 30.0 - 36.0 g/dL   RDW 13.7 11.5 - 15.5 %   Platelets 183.0 150.0 - 400.0 K/uL   Neutrophils Relative % 47.0 43.0 - 77.0 %   Lymphocytes Relative 41.5 12.0 - 46.0 %   Monocytes Relative 9.1 3.0 - 12.0 %  Eosinophils Relative 1.9 0.0 - 5.0 %    Basophils Relative 0.5 0.0 - 3.0 %   Neutro Abs 1.4 1.4 - 7.7 K/uL   Lymphs Abs 1.2 0.7 - 4.0 K/uL   Monocytes Absolute 0.3 0.1 - 1.0 K/uL   Eosinophils Absolute 0.1 0.0 - 0.7 K/uL   Basophils Absolute 0.0 0.0 - 0.1 K/uL  TSH  Result Value Ref Range   TSH 1.52 0.35 - 5.50 uIU/mL     COVID 19 screen:  No recent travel or known exposure to COVID19 The patient denies respiratory symptoms of COVID 19 at this time. The importance of social distancing was discussed today.   Assessment and Plan Problem List Items Addressed This Visit     Alzheimer disease ([place]) - Primary    Chronic stable per husband.   Not on memory med as trying to keep medication regimen simple.      Chronic insomnia    Sleeping very well with  Remeron at night.      GAD (generalized anxiety disorder)    Chronic... discussed finding hobby, exercise, volunteering etc would likely help her with purpose and excess energy. No clear need for med change... continue low dose venlafaxine 37.5 mg daily and remeron 15 mg at bedtime.          Eliezer Lofts, MD

## 2022-03-22 ENCOUNTER — Telehealth: Payer: Self-pay | Admitting: Family Medicine

## 2022-03-22 NOTE — Telephone Encounter (Signed)
  Encourage patient to contact the pharmacy for refills or they can request refills through [facility]  Did the patient contact the pharmacy: Yes  LAST APPOINTMENT DATE: 03/07/2022  NEXT APPOINTMENT DATE: 05/25/2022  MEDICATION: amLODipine (NORVASC) 2.5 MG tablet  latanoprost (XALATAN) 0.005 % ophthalmic solution  venlafaxine XR (EFFEXOR-XR) 37.5 MG 24 hr capsule  Is the patient out of medication? Not yet  PHARMACY: [facility], [place]  Let patient know to contact pharmacy at the end of the day to make sure medication is ready.  Please notify patient to allow 48-72 hours to process

## 2022-03-22 NOTE — Telephone Encounter (Signed)
Per chart, patient should have available refills at [facility].  I called and spoke with pharmacist and they confirmed she has refills on all her medications.  Some are too early to refill but they will get ready the refills that are due.  Patient is aware to go to pharmacy to pick up refills tomorrow at [facility].

## 2022-03-22 NOTE — Telephone Encounter (Signed)
Pt called in requesting a call back regarding medication . Please advise [phone]

## 2022-03-22 NOTE — Telephone Encounter (Signed)
See refill request phone call also sent today.

## 2022-04-07 ENCOUNTER — Telehealth: Payer: Self-pay

## 2022-04-07 NOTE — Progress Notes (Signed)
    Chronic Care Management Pharmacy Assistant   Name: PANHIA KARL  MRN: [id number] DOB: Sep 28, 1940  Reason for Encounter: CCM (Appointment Reminder)  Medications: Outpatient Encounter Medications as of 04/07/2022  Medication Sig   amLODipine (NORVASC) 2.5 MG tablet Take 2 tablets by mouth once daily   latanoprost (XALATAN) 0.005 % ophthalmic solution Place 1 drop into both eyes at bedtime.   mirtazapine (REMERON) 15 MG tablet Take 15 mg by mouth at bedtime.   Multiple Vitamin (MULTIVITAMIN) capsule Take 1 capsule by mouth daily.   venlafaxine XR (EFFEXOR-XR) 37.5 MG 24 hr capsule Take 1 capsule (37.5 mg total) by mouth daily with breakfast.   No facility-administered encounter medications on file as of 04/07/2022.   [name] was contacted to remind of upcoming telephone visit with Charlene Brooke on 04/12/2022 at 3:00. Patient was reminded to have any blood glucose and blood pressure readings available for review at appointment.   Message was left reminding patient of appointment.   CCM referral has been placed prior to visit?  No   Star Rating Drugs: Medication:  Last Fill: Day Supply No star rating drugs  Charlene Brooke, CPP notified  Marijean Niemann, [place] Clinical Pharmacy Assistant [phone]

## 2022-04-12 ENCOUNTER — Telehealth: Payer: PPO

## 2022-04-12 NOTE — Progress Notes (Deleted)
Chronic Care Management Pharmacy Note  04/12/2022 Name:  Jessica Hardin MRN:  [id number] DOB:  [birth year]  Summary: CCM F/U visit -Pt reports compliance with medications as prescribed. She reports she IS still taking mirtazapine at night and does not endorse hallucinations currently -Pt is due for repeat DEXA (last 2017 showed osteopenia)  Recommendations/Changes made from today's visit: -No med changes (added mirtazapine back to med list) -Recommend repeat DEXA scan this year  Follow up Plan: -Pharmacist follow up televisit scheduled for 6 months -PCP CPE 05/25/22    Subjective: Jessica Hardin is an 81 y.o. year old female who is a primary patient of Bedsole, Amy E, MD.  The CCM team was consulted for assistance with disease management and care coordination needs.    Engaged with patient by telephone for follow up visit in response to provider referral for pharmacy case management and/or care coordination services.   Consent to Services:  The patient was given information about Chronic Care Management services, agreed to services, and gave verbal consent prior to initiation of services.  Please see initial visit note for detailed documentation.   Patient Care Team: Jinny Sanders, MD as PCP - General (Family Medicine) Defrancesco, Alanda Slim, MD as Referring Physician (Obstetrics and Gynecology) Bary Castilla Forest Gleason, MD (General Surgery) Estill Cotta, MD as Referring Physician (Ophthalmology) Charlton Haws, [facility] as Pharmacist (Pharmacist)   Recent office visits: 03/07/22 Dr Diona Browner OV: anxiety - encouraged finding hobby, exercise to help with excess energy. Continue low-dose venlafaxine and remeron.  12/16/21 Dr Diona Browner OV: itching - labs stable, no clear cause, encouraged moisturizing cream. Add zyrtec HS.  09/28/21 phone call - pt thinks mirtazapine is making her hallucinate. PCP does not think so but advised she can hold it, advised f/u if issue does not  resolve. 09/09/21 Dr Diona Browner OV: f/u anxiety, sleep. Stable on lower dose of venlafaxine. Hold off on statin to simplify regimen. D/C atorvastatin, trazodone to simplify regimen. Still consider restarting donepezil in future. 08/18/21 Dr Diona Browner OV: discuss meds - wean off venlafaxine. Consider restart donepezil in future. 02/17/2021 - Eliezer Lofts, MD - Patient presented for dizziness. Increase amlodipine to 2 tablets (5 mg total). 02/03/2021 - Eliezer Lofts, MD - Patient presented for hypertension. If BP spikes > 150/90 for several days.. can increase amlodipine to 5 mg daily ( 2 tablets). No other medication changes.   Recent consult visits: 11/09/21 Urgent Care - ganglion cyst - f/u with ortho or hand specialist. 03/07/2021 - [facility] - Psychiatry - Patient presented for major depressive disorder, anxiety and insomnia due to other mental disorder. No other information.  01/18/2021 - [facility] - Patient presented for high blood pressure and headache for the last few days. Patient was referred to ED. 01/03/2021 - [facility] - Ophthalmology - Patient presented for presence of intraocular lens. No other information.   Hospital visits: 02/28/22 ED visit [facility]): anxiety - outpatient psych and PCP f/u recommended.  08/11/21 ED visit - palpitations. Workup reassuring. F/u with PCP.   Objective:  Lab Results  Component Value Date   CREATININE 1.02 12/16/2021   BUN 10 12/16/2021   GFR 51.32 (L) 12/16/2021   GFRNONAA 58 (L) 08/11/2021   GFRAA >60 02/04/2018   NA 142 12/16/2021   K 3.7 12/16/2021   CALCIUM 9.0 12/16/2021   CO2 32 12/16/2021   GLUCOSE 116 (H) 12/16/2021    Lab Results  Component Value Date/Time   GFR 51.32 (L) 12/16/2021 09:19  AM   GFR 51.67 (L) 12/24/2020 08:05 AM    Last diabetic Eye exam: No results found for: "HMDIABEYEEXA"  Last diabetic Foot exam: No results found for: "HMDIABFOOTEX"   Lab Results  Component Value Date   CHOL 221 (H) 12/24/2020    HDL 84.40 12/24/2020   LDLCALC 127 (H) 12/24/2020   TRIG 52.0 12/24/2020   CHOLHDL 3 12/24/2020       Latest Ref Rng & Units 12/16/2021    9:19 AM 12/24/2020    8:05 AM 08/30/2018    9:26 AM  Hepatic Function  Total Protein 6.0 - 8.3 g/dL 6.8  7.5  7.5   Albumin 3.5 - 5.2 g/dL 3.8  4.1  4.2   AST 0 - 37 U/L 19  25  25    ALT 0 - 35 U/L 13  18  17    Alk Phosphatase 39 - 117 U/L 71  74  79   Total Bilirubin 0.2 - 1.2 mg/dL 0.4  0.4  0.3     Lab Results  Component Value Date/Time   TSH 1.52 12/16/2021 09:19 AM   TSH 0.96 02/01/2017 12:53 PM   FREET4 0.71 02/01/2017 12:53 PM       Latest Ref Rng & Units 12/16/2021    9:19 AM 08/11/2021    3:41 PM 01/18/2021    7:49 PM  CBC  WBC 4.0 - 10.5 K/uL 3.0  3.7  4.7   Hemoglobin 12.0 - 15.0 g/dL 12.0  12.3  13.1   Hematocrit 36.0 - 46.0 % 37.8  39.6  39.9   Platelets 150.0 - 400.0 K/uL 183.0  190  180     Lab Results  Component Value Date/Time   VD25OH 36.74 08/30/2018 09:26 AM   VD25OH 32.44 02/01/2017 12:53 PM    Clinical ASCVD: No  The ASCVD Risk score (Arnett DK, et al., 2019) failed to calculate for the following reasons:   The 2019 ASCVD risk score is only valid for ages 72 to 59       03/07/2022   11:42 AM 01/03/2022    3:06 PM 09/09/2021    9:51 AM  Depression screen PHQ 2/9  Decreased Interest 1 0 1  Down, Depressed, Hopeless 1 0 1  PHQ - 2 Score 2 0 2  Altered sleeping 0  1  Tired, decreased energy 2  1  Change in appetite 0  1  Feeling bad or failure about yourself  1  0  Trouble concentrating 2  0  Moving slowly or fidgety/restless 1  0  Suicidal thoughts 0  0  PHQ-9 Score 8  5  Difficult doing work/chores Somewhat difficult  Somewhat difficult       09/09/2021    9:51 AM 09/16/2019    1:06 PM 08/19/2019    9:14 AM 08/08/2019    3:21 PM  GAD 7 : Generalized Anxiety Score  Nervous, Anxious, on Edge 1 3 3 2   Control/stop worrying 1 2 2  0  Worry too much - different things 1 2 2 1   Trouble relaxing 2 2 2 1    Restless 0 1 0 0  Easily annoyed or irritable 0 0 1 1  Afraid - awful might happen 0 0 1 1  Total GAD 7 Score 5 10 11 6   Anxiety Difficulty   Somewhat difficult Somewhat difficult    Social History   Tobacco Use  Smoking Status Never  Smokeless Tobacco Never   BP Readings from Last 3 Encounters:  03/07/22 120/70  02/28/22 (!) 143/77  12/16/21 110/70   Pulse Readings from Last 3 Encounters:  03/07/22 72  02/28/22 83  12/16/21 80   Wt Readings from Last 3 Encounters:  03/07/22 140 lb 8 oz (63.7 kg)  02/28/22 135 lb (61.2 kg)  01/03/22 138 lb (62.6 kg)   BMI Readings from Last 3 Encounters:  03/07/22 29.36 kg/m  02/28/22 28.22 kg/m  01/03/22 28.84 kg/m    Assessment/Interventions: Review of patient past medical history, allergies, medications, health status, including review of consultants reports, laboratory and other test data, was performed as part of comprehensive evaluation and provision of chronic care management services.   SDOH:  (Social Determinants of Health) assessments and interventions performed: Yes SDOH Interventions    Flowsheet Row Clinical Support from 01/03/2022 in [place] at [place] Management from 07/12/2021 in [place] at [facility] Visit from 07/01/2019 in [place] at [place] Interventions Intervention Not Indicated Intervention Not Indicated --  Housing Interventions Intervention Not Indicated -- --  Transportation Interventions Intervention Not Indicated -- --  Depression Interventions/Treatment  -- -- Medication  Financial Strain Interventions Intervention Not Indicated Intervention Not Indicated --  Physical Activity Interventions Intervention Not Indicated -- --  Stress Interventions Intervention Not Indicated -- --  Social Connections Interventions Intervention Not Indicated -- --       SDOH Screenings   Food Insecurity: No Food  Insecurity (01/03/2022)  Housing: Low Risk  (01/03/2022)  Transportation Needs: No Transportation Needs (01/03/2022)  Alcohol Screen: Low Risk  (01/03/2022)  Depression (PHQ2-9): Medium Risk (03/07/2022)  Financial Resource Strain: Low Risk  (01/03/2022)  Physical Activity: Insufficiently Active (01/03/2022)  Social Connections: Socially Integrated (01/03/2022)  Stress: No Stress Concern Present (01/03/2022)  Tobacco Use: Low Risk  (03/07/2022)    [place]  Allergies  Allergen Reactions   Shellfish Allergy Hives and Other (See Comments)    CHEST PAIN AND ITCHINESS (INTERNAL--MOUTH THRU GI TRACT)  WITH NAUSEA    Medications Reviewed Today     Reviewed by Carter Kitten, CMA (Certified Medical Assistant) on 03/07/22 at [place] List Status: <None>   Medication Order Taking? Sig Documenting Provider Last Dose Status Informant  amLODipine (NORVASC) 2.5 MG tablet [id number]  Take 2 tablets by mouth once daily Bedsole, Amy E, MD  Active   latanoprost (XALATAN) 0.005 % ophthalmic solution [id number]  Place 1 drop into both eyes at bedtime. [provider]  Active Multiple Informants  mirtazapine (REMERON) 15 MG tablet [id number]  Take 15 mg by mouth at bedtime. [provider]  Active   Multiple Vitamin (MULTIVITAMIN) capsule [id number]  Take 1 capsule by mouth daily. [provider]  Active Multiple Informants           Med Note Delsa Sale, [place] Aug 24, 2020 10:16 AM)    venlafaxine XR (EFFEXOR-XR) 37.5 MG 24 hr capsule [id number]  Take 1 capsule (37.5 mg total) by mouth daily with breakfast. Jinny Sanders, MD  Active             Patient Active Problem List   Diagnosis Date Noted   Itching 12/16/2021   Bilateral knee pain 03/01/2021   Dizziness 02/17/2021   Hypercholesteremia 12/31/2020   Alzheimer disease ([place]) 09/10/2020   Acute pain of both shoulders 01/23/2020   Essential hypertension 05/14/2019   Arthralgia 01/21/2019   GAD (generalized anxiety  disorder)  10/30/2017   MDD (major depressive disorder), single episode, mild ([place]) 10/30/2017   Chronic insomnia 10/30/2017   Allergic rhinitis 10/05/2016   Osteopenia 11/18/2015   Chronic constipation 07/20/2015   Internal hemorrhoid, bleeding 07/20/2015   Counseling regarding end of life decision making 09/08/2014   Gastroesophageal reflux disease without esophagitis 08/13/2014   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     Medication Assistance: None required.  Patient affirms current coverage meets needs.  Compliance/Adherence/Medication fill history: Care Gaps: None  Star-Rating Drugs: None  Medication Access: Within the past 30 days, how often has patient missed a dose of medication? *** Is a pillbox or other method used to improve adherence? {YES/NO:21197} Factors that may affect medication adherence? {CHL DESC; BARRIERS:21522} Are meds synced by current pharmacy? {YES/NO:21197} Are meds delivered by current pharmacy? {YES/NO:21197} Does patient experience delays in picking up medications due to transportation concerns? {YES/NO:21197}  [facility] Reviewed: Is patient disadvantaged to use [facility]?: {YES/NO:21197} Current Rx insurance plan: *** Name and location of Current pharmacy:  [facility] [address] [place] [address] Phone: [phone] Fax:  [phone]  [facility] services reviewed with patient today?: {YES/NO:21197} Patient requests to transfer care to [facility]?: {YES/NO:21197} Reason patient declined to change pharmacies: {US patient preference:[id number]}   Care Plan and Follow Up Patient Decision:  Patient agrees to Care Plan and Follow-up.  Plan: Telephone follow up appointment with care management team member scheduled for:  6 months  Charlene Brooke, PharmD, BCACP Clinical Pharmacist [facility] [phone]    Current Barriers:  Memory issues; desire for simplified medication regimen  Pharmacist Clinical Goal(s):  Patient will contact provider office for questions/concerns as evidenced notation of same in electronic health record through collaboration with PharmD and provider.   Interventions: 1:1 collaboration with Jinny Sanders, MD regarding development and update of comprehensive plan of care as evidenced by provider attestation and co-signature Inter-disciplinary care team collaboration (see longitudinal plan of care) Comprehensive medication review performed; medication list updated in electronic medical record  Hypertension (BP goal <140/90) -Controlled - pt endorses compliance with medication and home BP is at goal -Current home BP readings: 135/69, P 76 -Current treatment: Amlodipine 2.5 mg - 2 tab daily - Appropriate, Effective, Safe, Accessible -Denies hypotensive/hypertensive symptoms -Educated on BP goals and benefits of medications for prevention of heart attack, stroke and kidney damage; -Counseled to monitor BP at home periodically, document, and provide log at future appointments -Recommended to continue current medication  Hyperlipidemia: (LDL goal < 100) -Not ideally controlled - pt and PCP have agreed to hold off on atorvastatin in interest of simplifying medication regimen -Current treatment: None -Medications previously tried: atorvastatin   -Educated on Cholesterol goals; Benefits of statin for ASCVD risk reduction;  Depression/Anxiety (Goal: manage symptoms) -Not ideally controlled - per patient she is not sure medication is helping; she still feels down, depressed; she does report she is sleeping fairly well -PHQ9: 2 (12/2020) - minimal  depression -GAD7: 10 (08/2018) - moderate anxiety -Connected with Dr Nicolasa Ducking for mental health support -Current treatment: Venlafaxine ER 37.5 mg daily - Appropriate, Effective, Safe, Accessible Mirtazapine 15 mg -1/2 tab HS - Appropriate, Effective, Safe, Accessible -Medications previously tried/failed: sertraline, citalopram, trazodone, hydroxyzine -Educated on Benefits of medication for symptom control -Recommend to continue current medication  Alzheimer's Disease (Goal: slow progression) -Not ideally controlled - pt has been off donepezil for a while, has not wanted to add any medications due to desire to simplify regimen -Current treatment  None -Medications previously tried: donepezil -Discussed benefits of donepezil for slowing memory decline; consider restarting donepezil in future  Osteopenia (Goal prevent fractures) -Not ideally controlled - pt is due for repeat DEXA scan; she is not taking calcium or vitamin D -Last DEXA Scan: 11/18/2015   T-Score femoral neck: -1.6  T-Score lumbar spine: -1.3  10-year probability of major osteoporotic fracture: 5.5%  10-year probability of hip fracture: 1.2% -Patient is not a candidate for pharmacologic treatment -Current treatment  None -Medications previously tried: n/a  -Recommend [phone] units of vitamin D daily. Recommend 1200 mg of calcium daily from dietary and supplemental sources. -Recommend repeat DEXA scan this year  Patient Goals/Self-Care Activities Patient will:  - take medications as prescribed as evidenced by patient report and record review

## 2022-04-13 ENCOUNTER — Ambulatory Visit: Payer: PPO

## 2022-04-13 ENCOUNTER — Telehealth: Payer: Self-pay | Admitting: Pharmacist

## 2022-04-13 NOTE — Progress Notes (Deleted)
Chronic Care Management Pharmacy Note  04/13/2022 Name:  Jessica Hardin MRN:  [id number] DOB:  [birth year]  Summary: CCM F/U visit -Pt reports compliance with medications as prescribed. She reports she IS still taking mirtazapine at night and does not endorse hallucinations currently -Pt is due for repeat DEXA (last 2017 showed osteopenia)  Recommendations/Changes made from today's visit: -No med changes (added mirtazapine back to med list) -Recommend repeat DEXA scan this year  Follow up Plan: -Pharmacist follow up televisit scheduled for 6 months -PCP CPE 05/25/22    Subjective: Jessica Hardin is an 81 y.o. year old female who is a primary patient of Bedsole, Amy E, MD.  The CCM team was consulted for assistance with disease management and care coordination needs.    Engaged with patient by telephone for follow up visit in response to provider referral for pharmacy case management and/or care coordination services.   Consent to Services:  The patient was given information about Chronic Care Management services, agreed to services, and gave verbal consent prior to initiation of services.  Please see initial visit note for detailed documentation.   Patient Care Team: Jinny Sanders, MD as PCP - General (Family Medicine) Defrancesco, Alanda Slim, MD as Referring Physician (Obstetrics and Gynecology) Bary Castilla Forest Gleason, MD (General Surgery) Estill Cotta, MD as Referring Physician (Ophthalmology) Charlton Haws, [facility] as Pharmacist (Pharmacist)   Recent office visits: 03/07/22 Dr Diona Browner OV: anxiety - encouraged finding hobby, exercise to help with excess energy. Continue low-dose venlafaxine and remeron.  12/16/21 Dr Diona Browner OV: itching - labs stable, no clear cause, encouraged moisturizing cream. Add zyrtec HS.  09/28/21 phone call - pt thinks mirtazapine is making her hallucinate. PCP does not think so but advised she can hold it, advised f/u if issue does not  resolve. 09/09/21 Dr Diona Browner OV: f/u anxiety, sleep. Stable on lower dose of venlafaxine. Hold off on statin to simplify regimen. D/C atorvastatin, trazodone to simplify regimen. Still consider restarting donepezil in future. 08/18/21 Dr Diona Browner OV: discuss meds - wean off venlafaxine. Consider restart donepezil in future. 02/17/2021 - Eliezer Lofts, MD - Patient presented for dizziness. Increase amlodipine to 2 tablets (5 mg total). 02/03/2021 - Eliezer Lofts, MD - Patient presented for hypertension. If BP spikes > 150/90 for several days.. can increase amlodipine to 5 mg daily ( 2 tablets). No other medication changes.   Recent consult visits: 11/09/21 Urgent Care - ganglion cyst - f/u with ortho or hand specialist. 03/07/2021 - [facility] - Psychiatry - Patient presented for major depressive disorder, anxiety and insomnia due to other mental disorder. No other information.  01/18/2021 - [facility] - Patient presented for high blood pressure and headache for the last few days. Patient was referred to ED. 01/03/2021 - [place] - Ophthalmology - Patient presented for presence of intraocular lens. No other information.   Hospital visits: 02/28/22 ED visit [facility]): anxiety - outpatient psych and PCP f/u recommended.  08/11/21 ED visit - palpitations. Workup reassuring. F/u with PCP.   Objective:  Lab Results  Component Value Date   CREATININE 1.02 12/16/2021   BUN 10 12/16/2021   GFR 51.32 (L) 12/16/2021   GFRNONAA 58 (L) 08/11/2021   GFRAA >60 02/04/2018   NA 142 12/16/2021   K 3.7 12/16/2021   CALCIUM 9.0 12/16/2021   CO2 32 12/16/2021   GLUCOSE 116 (H) 12/16/2021    Lab Results  Component Value Date/Time   GFR 51.32 (L) 12/16/2021 09:19  AM   GFR 51.67 (L) 12/24/2020 08:05 AM    Last diabetic Eye exam: No results found for: "HMDIABEYEEXA"  Last diabetic Foot exam: No results found for: "HMDIABFOOTEX"   Lab Results  Component Value Date   CHOL 221 (H) 12/24/2020    HDL 84.40 12/24/2020   LDLCALC 127 (H) 12/24/2020   TRIG 52.0 12/24/2020   CHOLHDL 3 12/24/2020       Latest Ref Rng & Units 12/16/2021    9:19 AM 12/24/2020    8:05 AM 08/30/2018    9:26 AM  Hepatic Function  Total Protein 6.0 - 8.3 g/dL 6.8  7.5  7.5   Albumin 3.5 - 5.2 g/dL 3.8  4.1  4.2   AST 0 - 37 U/L 19  25  25    ALT 0 - 35 U/L 13  18  17    Alk Phosphatase 39 - 117 U/L 71  74  79   Total Bilirubin 0.2 - 1.2 mg/dL 0.4  0.4  0.3     Lab Results  Component Value Date/Time   TSH 1.52 12/16/2021 09:19 AM   TSH 0.96 02/01/2017 12:53 PM   FREET4 0.71 02/01/2017 12:53 PM       Latest Ref Rng & Units 12/16/2021    9:19 AM 08/11/2021    3:41 PM 01/18/2021    7:49 PM  CBC  WBC 4.0 - 10.5 K/uL 3.0  3.7  4.7   Hemoglobin 12.0 - 15.0 g/dL 12.0  12.3  13.1   Hematocrit 36.0 - 46.0 % 37.8  39.6  39.9   Platelets 150.0 - 400.0 K/uL 183.0  190  180     Lab Results  Component Value Date/Time   VD25OH 36.74 08/30/2018 09:26 AM   VD25OH 32.44 02/01/2017 12:53 PM    Clinical ASCVD: No  The ASCVD Risk score (Arnett DK, et al., 2019) failed to calculate for the following reasons:   The 2019 ASCVD risk score is only valid for ages 49 to 65       03/07/2022   11:42 AM 01/03/2022    3:06 PM 09/09/2021    9:51 AM  Depression screen PHQ 2/9  Decreased Interest 1 0 1  Down, Depressed, Hopeless 1 0 1  PHQ - 2 Score 2 0 2  Altered sleeping 0  1  Tired, decreased energy 2  1  Change in appetite 0  1  Feeling bad or failure about yourself  1  0  Trouble concentrating 2  0  Moving slowly or fidgety/restless 1  0  Suicidal thoughts 0  0  PHQ-9 Score 8  5  Difficult doing work/chores Somewhat difficult  Somewhat difficult       09/09/2021    9:51 AM 09/16/2019    1:06 PM 08/19/2019    9:14 AM 08/08/2019    3:21 PM  GAD 7 : Generalized Anxiety Score  Nervous, Anxious, on Edge 1 3 3 2   Control/stop worrying 1 2 2  0  Worry too much - different things 1 2 2 1   Trouble relaxing 2 2 2 1    Restless 0 1 0 0  Easily annoyed or irritable 0 0 1 1  Afraid - awful might happen 0 0 1 1  Total GAD 7 Score 5 10 11 6   Anxiety Difficulty   Somewhat difficult Somewhat difficult    Social History   Tobacco Use  Smoking Status Never  Smokeless Tobacco Never   BP Readings from Last 3 Encounters:  03/07/22 120/70  02/28/22 (!) 143/77  12/16/21 110/70   Pulse Readings from Last 3 Encounters:  03/07/22 72  02/28/22 83  12/16/21 80   Wt Readings from Last 3 Encounters:  03/07/22 140 lb 8 oz (63.7 kg)  02/28/22 135 lb (61.2 kg)  01/03/22 138 lb (62.6 kg)   BMI Readings from Last 3 Encounters:  03/07/22 29.36 kg/m  02/28/22 28.22 kg/m  01/03/22 28.84 kg/m    Assessment/Interventions: Review of patient past medical history, allergies, medications, health status, including review of consultants reports, laboratory and other test data, was performed as part of comprehensive evaluation and provision of chronic care management services.   SDOH:  (Social Determinants of Health) assessments and interventions performed: Yes SDOH Interventions    Flowsheet Row Clinical Support from 01/03/2022 in [place] at [place] Management from 07/12/2021 in [place] at [facility] Visit from 07/01/2019 in [place] at [place] Interventions Intervention Not Indicated Intervention Not Indicated --  Housing Interventions Intervention Not Indicated -- --  Transportation Interventions Intervention Not Indicated -- --  Depression Interventions/Treatment  -- -- Medication  Financial Strain Interventions Intervention Not Indicated Intervention Not Indicated --  Physical Activity Interventions Intervention Not Indicated -- --  Stress Interventions Intervention Not Indicated -- --  Social Connections Interventions Intervention Not Indicated -- --       SDOH Screenings   Food Insecurity: No Food  Insecurity (01/03/2022)  Housing: Low Risk  (01/03/2022)  Transportation Needs: No Transportation Needs (01/03/2022)  Alcohol Screen: Low Risk  (01/03/2022)  Depression (PHQ2-9): Medium Risk (03/07/2022)  Financial Resource Strain: Low Risk  (01/03/2022)  Physical Activity: Insufficiently Active (01/03/2022)  Social Connections: Socially Integrated (01/03/2022)  Stress: No Stress Concern Present (01/03/2022)  Tobacco Use: Low Risk  (03/07/2022)    [place]  Allergies  Allergen Reactions   Shellfish Allergy Hives and Other (See Comments)    CHEST PAIN AND ITCHINESS (INTERNAL--MOUTH THRU GI TRACT)  WITH NAUSEA    Medications Reviewed Today     Reviewed by Carter Kitten, CMA (Certified Medical Assistant) on 03/07/22 at [facility] List Status: <None>   Medication Order Taking? Sig Documenting Provider Last Dose Status Informant  amLODipine (NORVASC) 2.5 MG tablet [id number]  Take 2 tablets by mouth once daily Bedsole, Amy E, MD  Active   latanoprost (XALATAN) 0.005 % ophthalmic solution [id number]  Place 1 drop into both eyes at bedtime. [provider]  Active Multiple Informants  mirtazapine (REMERON) 15 MG tablet [id number]  Take 15 mg by mouth at bedtime. [provider]  Active   Multiple Vitamin (MULTIVITAMIN) capsule [id number]  Take 1 capsule by mouth daily. [provider]  Active Multiple Informants           Med Note Delsa Sale, [place] Aug 24, 2020 10:16 AM)    venlafaxine XR (EFFEXOR-XR) 37.5 MG 24 hr capsule [id number]  Take 1 capsule (37.5 mg total) by mouth daily with breakfast. Jinny Sanders, MD  Active             Patient Active Problem List   Diagnosis Date Noted   Itching 12/16/2021   Bilateral knee pain 03/01/2021   Dizziness 02/17/2021   Hypercholesteremia 12/31/2020   Alzheimer disease ([place]) 09/10/2020   Acute pain of both shoulders 01/23/2020   Essential hypertension 05/14/2019   Arthralgia 01/21/2019   GAD (generalized anxiety  disorder)  10/30/2017   MDD (major depressive disorder), single episode, mild ([place]) 10/30/2017   Chronic insomnia 10/30/2017   Allergic rhinitis 10/05/2016   Osteopenia 11/18/2015   Chronic constipation 07/20/2015   Internal hemorrhoid, bleeding 07/20/2015   Counseling regarding end of life decision making 09/08/2014   Gastroesophageal reflux disease without esophagitis 08/13/2014   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     Medication Assistance: None required.  Patient affirms current coverage meets needs.  Compliance/Adherence/Medication fill history: Care Gaps: None  Star-Rating Drugs: None  Medication Access: Within the past 30 days, how often has patient missed a dose of medication? *** Is a pillbox or other method used to improve adherence? {YES/NO:21197} Factors that may affect medication adherence? {CHL DESC; BARRIERS:21522} Are meds synced by current pharmacy? {YES/NO:21197} Are meds delivered by current pharmacy? {YES/NO:21197} Does patient experience delays in picking up medications due to transportation concerns? {YES/NO:21197}  [facility] Reviewed: Is patient disadvantaged to use [facility]?: {YES/NO:21197} Current Rx insurance plan: *** Name and location of Current pharmacy:  [facility] [address], [place] - [place] [address] Phone: [phone] Fax:  [phone]  [facility] services reviewed with patient today?: {YES/NO:21197} Patient requests to transfer care to [facility]?: {YES/NO:21197} Reason patient declined to change pharmacies: {US patient preference:27474}   Care Plan and Follow Up Patient Decision:  Patient agrees to Care Plan and Follow-up.  Plan: Telephone follow up appointment with care management team member scheduled for:  6 months  Charlene Brooke, PharmD, BCACP Clinical Pharmacist [facility] [phone]    Current Barriers:  Memory issues; desire for simplified medication regimen  Pharmacist Clinical Goal(s):  Patient will contact provider office for questions/concerns as evidenced notation of same in electronic health record through collaboration with PharmD and provider.   Interventions: 1:1 collaboration with Jinny Sanders, MD regarding development and update of comprehensive plan of care as evidenced by provider attestation and co-signature Inter-disciplinary care team collaboration (see longitudinal plan of care) Comprehensive medication review performed; medication list updated in electronic medical record  Hypertension (BP goal <140/90) -Controlled - pt endorses compliance with medication and home BP is at goal -Current home BP readings: 135/69, P 76 -Current treatment: Amlodipine 2.5 mg - 2 tab daily - Appropriate, Effective, Safe, Accessible -Denies hypotensive/hypertensive symptoms -Educated on BP goals and benefits of medications for prevention of heart attack, stroke and kidney damage; -Counseled to monitor BP at home periodically, document, and provide log at future appointments -Recommended to continue current medication  Hyperlipidemia: (LDL goal < 100) -Not ideally controlled - pt and PCP have agreed to hold off on atorvastatin in interest of simplifying medication regimen -Current treatment: None -Medications previously tried: atorvastatin   -Educated on Cholesterol goals; Benefits of statin for ASCVD risk reduction;  Depression/Anxiety (Goal: manage symptoms) -Not ideally controlled - per patient she is not sure medication is helping; she still feels down, depressed; she does report she is sleeping fairly well -PHQ9: 2 (12/2020) - minimal  depression -GAD7: 10 (08/2018) - moderate anxiety -Connected with Dr Nicolasa Ducking for mental health support -Current treatment: Venlafaxine ER 37.5 mg daily - Appropriate, Effective, Safe, Accessible Mirtazapine 15 mg -1/2 tab HS - Appropriate, Effective, Safe, Accessible -Medications previously tried/failed: sertraline, citalopram, trazodone, hydroxyzine -Educated on Benefits of medication for symptom control -Recommend to continue current medication  Alzheimer's Disease (Goal: slow progression) -Not ideally controlled - pt has been off donepezil for a while, has not wanted to add any medications due to desire to simplify regimen -Current treatment  None -Medications previously tried: donepezil -Discussed benefits of donepezil for slowing memory decline; consider restarting donepezil in future  Osteopenia (Goal prevent fractures) -Not ideally controlled - pt is due for repeat DEXA scan; she is not taking calcium or vitamin D -Last DEXA Scan: 11/18/2015   T-Score femoral neck: -1.6  T-Score lumbar spine: -1.3  10-year probability of major osteoporotic fracture: 5.5%  10-year probability of hip fracture: 1.2% -Patient is not a candidate for pharmacologic treatment -Current treatment  None -Medications previously tried: n/a  -Recommend [phone] units of vitamin D daily. Recommend 1200 mg of calcium daily from dietary and supplemental sources. -Recommend repeat DEXA scan this year  Patient Goals/Self-Care Activities Patient will:  - take medications as prescribed as evidenced by patient report and record review

## 2022-04-13 NOTE — Telephone Encounter (Signed)
Spoke with patient 04/12/22 for phone appt, she was not able to verify medications over the phone. Changed to in-person appt for patient to bring in all her medication bottles on 04/13/22, but pt was confused and did not come in. Will reschedule in-person appt.

## 2022-04-17 NOTE — Telephone Encounter (Signed)
Patient has been rescheduled for in-office appointment with Charlene Brooke on 04/26/2022. Patient wanted her appointment as soon as possible.   Charlene Brooke, CPP notified  Marijean Niemann, [place] Clinical Pharmacy Assistant [phone]

## 2022-04-21 ENCOUNTER — Telehealth: Payer: Self-pay

## 2022-04-21 NOTE — Progress Notes (Signed)
    Chronic Care Management Pharmacy Assistant   Name: Jessica Hardin  MRN: [id number] DOB: [birth year]  Reason for Encounter: CCM (Appointment Reminder)  Medications: Outpatient Encounter Medications as of 04/21/2022  Medication Sig   amLODipine (NORVASC) 2.5 MG tablet Take 2 tablets by mouth once daily   latanoprost (XALATAN) 0.005 % ophthalmic solution Place 1 drop into both eyes at bedtime.   mirtazapine (REMERON) 15 MG tablet Take 15 mg by mouth at bedtime.   Multiple Vitamin (MULTIVITAMIN) capsule Take 1 capsule by mouth daily.   venlafaxine XR (EFFEXOR-XR) 37.5 MG 24 hr capsule Take 1 capsule (37.5 mg total) by mouth daily with breakfast.   No facility-administered encounter medications on file as of 04/21/2022.   [name] was contacted to remind of upcoming office visit with Charlene Brooke on 04/26/2022 at 8:45. Patient was reminded to have any blood glucose and blood pressure readings available for review at appointment.   Message was left reminding patient of appointment.  CCM referral has been placed prior to visit?  No    Star Rating Drugs: Medication:                Last Fill:         Day Supply No star rating drugs   Charlene Brooke, CPP notified   Marijean Niemann, [place] Clinical Pharmacy Assistant [phone]

## 2022-04-26 ENCOUNTER — Ambulatory Visit: Payer: PPO | Admitting: Pharmacist

## 2022-04-26 DIAGNOSIS — F0280 Dementia in other diseases classified elsewhere without behavioral disturbance: Secondary | ICD-10-CM

## 2022-04-26 DIAGNOSIS — F411 Generalized anxiety disorder: Secondary | ICD-10-CM

## 2022-04-26 DIAGNOSIS — F320 Major depressive disorder, single episode, mild: Secondary | ICD-10-CM

## 2022-04-26 DIAGNOSIS — I10 Essential (primary) hypertension: Secondary | ICD-10-CM

## 2022-04-26 DIAGNOSIS — E7800 Pure hypercholesterolemia, unspecified: Secondary | ICD-10-CM

## 2022-04-26 NOTE — Patient Instructions (Signed)
Visit Information  Phone number for Pharmacist: [phone]   Goals Addressed   None     Care Plan : [place]  Updates made by Charlton Haws, [place] since 04/26/2022 12:00 AM     Problem: Hypertension, Hyperlipidemia, Depression, Anxiety, and Osteopenia   Priority: High     Long-Range Goal: Disease mgmt   Start Date: 07/14/2021  Expected End Date: 07/14/2022  Recent Progress: On track  Priority: High  Note:   Current Barriers:  Memory issues  Pharmacist Clinical Goal(s):  Patient will contact provider office for questions/concerns as evidenced notation of same in electronic health record through collaboration with PharmD and provider.   Interventions: 1:1 collaboration with Jinny Sanders, MD regarding development and update of comprehensive plan of care as evidenced by provider attestation and co-signature Inter-disciplinary care team collaboration (see longitudinal plan of care) Comprehensive medication review performed; medication list updated in electronic medical record  Hypertension (BP goal <140/90) -Controlled - pt endorses compliance with medication and home BP is at goal -Current home BP readings: 135/69, P 76 -Current treatment: Amlodipine 2.5 mg - 2 tab daily - Appropriate, Effective, Safe, Accessible -Denies hypotensive/hypertensive symptoms -Educated on BP goals and benefits of medications for prevention of heart attack, stroke and kidney damage; -Counseled to monitor BP at home periodically, document, and provide log at future appointments -Recommended to continue current medication  Hyperlipidemia: (LDL goal < 100) -Not ideally controlled - pt and PCP have agreed to hold off on atorvastatin in interest of simplifying medication regimen -Current treatment: None -Medications previously tried: atorvastatin  -Educated on Cholesterol goals; Benefits of statin for ASCVD risk reduction;  Depression/Anxiety (Goal: manage symptoms) -Not  ideally controlled - pt went to ED recently for anxiety; it appears she was probably not taking mirtazapine at that time (based on fill dates); she now endorses compliance with 1/2 tab of mirtazapine at night and reports anxiety is a little better; she reports she is sleeping fine -PHQ9: 2 (12/2020) - minimal depression -GAD7: 10 (08/2018) - moderate anxiety -Connected with Dr Nicolasa Ducking for mental health support -Current treatment: Venlafaxine ER 37.5 mg daily - Appropriate, Effective, Safe, Accessible Mirtazapine 15 mg -1/2 tab HS - Appropriate, Effective, Safe, Accessible -Medications previously tried/failed: sertraline, citalopram, trazodone, hydroxyzine -Educated on Benefits of medication for symptom control -Recommend to continue current medication  Alzheimer's Disease (Goal: slow progression) -Not ideally controlled - pt has been off donepezil for a while, has not wanted to add any medications due to desire to simplify regimen -Current treatment  None -Medications previously tried: donepezil -Discussed benefits of donepezil for slowing memory decline; consider restarting donepezil in future  Osteopenia (Goal prevent fractures) -Not ideally controlled - pt is due for repeat DEXA scan; she is not taking calcium or vitamin D -Last DEXA Scan: 11/18/2015   T-Score femoral neck: -1.6  T-Score lumbar spine: -1.3  10-year probability of major osteoporotic fracture: 5.5%  10-year probability of hip fracture: 1.2% -Patient is not a candidate for pharmacologic treatment -Current treatment  None -Medications previously tried: n/a  -Recommend [phone] units of vitamin D daily. Recommend 1200 mg of calcium daily from dietary and supplemental sources. -Recommend repeat DEXA scan this year  Patient Goals/Self-Care Activities Patient will:  - take medications as prescribed as evidenced by patient report and record review      Print copy of patient instructions, educational materials, and care plan  provided in person.  Telephone follow up appointment with pharmacy team member scheduled for:PRN  Charlene Brooke, PharmD, BCACP Clinical Pharmacist [facility] [phone]

## 2022-04-26 NOTE — Progress Notes (Signed)
Chronic Care Management Pharmacy Note  04/26/2022 Name:  Jessica Hardin MRN:  [id number] DOB:  Nov 11, 1940  Summary: CCM F/U visit -Verified medications with patient in office, she brought bottles in -Anxiety: pt is taking 1/2 tablet of mirtazapine nightly and venlafaxine in AM; she reports anxiety is better   Recommendations/Changes made from today's visit: -No med changes  Follow up Plan: -Pharmacist follow up PRN -PCP CPE 05/25/22    Subjective: Jessica Hardin is an 81 y.o. year old female who is a primary patient of Bedsole, Amy E, MD.  The CCM team was consulted for assistance with disease management and care coordination needs.    Engaged with patient by telephone for follow up visit in response to provider referral for pharmacy case management and/or care coordination services.   Consent to Services:  The patient was given information about Chronic Care Management services, agreed to services, and gave verbal consent prior to initiation of services.  Please see initial visit note for detailed documentation.   Patient Care Team: Jinny Sanders, MD as PCP - General (Family Medicine) Defrancesco, Alanda Slim, MD as Referring Physician (Obstetrics and Gynecology) Bary Castilla [place] Gleason, MD (General Surgery) Estill Cotta, MD as Referring Physician (Ophthalmology) Charlton Haws, [facility] as Pharmacist (Pharmacist)   Recent office visits: 03/07/22 Dr Diona Browner OV: anxiety - encouraged finding hobby, exercise to help with excess energy. Continue low-dose venlafaxine and remeron.  12/16/21 Dr Diona Browner OV: itching - labs stable, no clear cause, encouraged moisturizing cream. Add zyrtec HS.  09/28/21 phone call - pt thinks mirtazapine is making her hallucinate. PCP does not think so but advised she can hold it, advised f/u if issue does not resolve. 09/09/21 Dr Diona Browner OV: f/u anxiety, sleep. Stable on lower dose of venlafaxine. Hold off on statin to simplify regimen. D/C  atorvastatin, trazodone to simplify regimen. Still consider restarting donepezil in future. 08/18/21 Dr Diona Browner OV: discuss meds - wean off venlafaxine. Consider restart donepezil in future. 02/17/2021 - Eliezer Lofts, MD - Patient presented for dizziness. Increase amlodipine to 2 tablets (5 mg total). 02/03/2021 - Eliezer Lofts, MD - Patient presented for hypertension. If BP spikes > 150/90 for several days.. can increase amlodipine to 5 mg daily ( 2 tablets). No other medication changes.   Recent consult visits: 11/09/21 Urgent Care - ganglion cyst - f/u with ortho or hand specialist. 03/07/2021 - [facility] - Psychiatry - Patient presented for major depressive disorder, anxiety and insomnia due to other mental disorder. No other information.  01/18/2021 - [facility] - Patient presented for high blood pressure and headache for the last few days. Patient was referred to ED. 01/03/2021 - [place] - Ophthalmology - Patient presented for presence of intraocular lens. No other information.   Hospital visits: 02/28/22 ED visit [facility]): anxiety - outpatient psych and PCP f/u recommended.  08/11/21 ED visit - palpitations. Workup reassuring. F/u with PCP.   Objective:  Lab Results  Component Value Date   CREATININE 1.02 12/16/2021   BUN 10 12/16/2021   GFR 51.32 (L) 12/16/2021   GFRNONAA 58 (L) 08/11/2021   GFRAA >60 02/04/2018   NA 142 12/16/2021   K 3.7 12/16/2021   CALCIUM 9.0 12/16/2021   CO2 32 12/16/2021   GLUCOSE 116 (H) 12/16/2021    Lab Results  Component Value Date/Time   GFR 51.32 (L) 12/16/2021 09:19 AM   GFR 51.67 (L) 12/24/2020 08:05 AM    Last diabetic Eye exam: No results found  for: "HMDIABEYEEXA"  Last diabetic Foot exam: No results found for: "HMDIABFOOTEX"   Lab Results  Component Value Date   CHOL 221 (H) 12/24/2020   HDL 84.40 12/24/2020   LDLCALC 127 (H) 12/24/2020   TRIG 52.0 12/24/2020   CHOLHDL 3 12/24/2020       Latest Ref Rng & Units  12/16/2021    9:19 AM 12/24/2020    8:05 AM 08/30/2018    9:26 AM  Hepatic Function  Total Protein 6.0 - 8.3 g/dL 6.8  7.5  7.5   Albumin 3.5 - 5.2 g/dL 3.8  4.1  4.2   AST 0 - 37 U/L _0 ALT 0 - 35 U/L _1 Alk Phosphatase 39 - 117 U/L 71  74  79   Total Bilirubin 0.2 - 1.2 mg/dL 0.4  0.4  0.3     Lab Results  Component Value Date/Time   TSH 1.52 12/16/2021 09:19 AM   TSH 0.96 02/01/2017 12:53 PM   FREET4 0.71 02/01/2017 12:53 PM       Latest Ref Rng & Units 12/16/2021    9:19 AM 08/11/2021    3:41 PM 01/18/2021    7:49 PM  CBC  WBC 4.0 - 10.5 K/uL 3.0  3.7  4.7   Hemoglobin 12.0 - 15.0 g/dL 12.0  12.3  13.1   Hematocrit 36.0 - 46.0 % 37.8  39.6  39.9   Platelets 150.0 - 400.0 K/uL 183.0  190  180     Lab Results  Component Value Date/Time   VD25OH 36.74 08/30/2018 09:26 AM   VD25OH 32.44 02/01/2017 12:53 PM    Clinical ASCVD: No  The ASCVD Risk score (Arnett DK, et al., 2019) failed to calculate for the following reasons:   The 2019 ASCVD risk score is only valid for ages 65 to 5       03/07/2022   11:42 AM 01/03/2022    3:06 PM 09/09/2021    9:51 AM  Depression screen PHQ 2/9  Decreased Interest 1 0 1  Down, Depressed, Hopeless 1 0 1  PHQ - 2 Score 2 0 2  Altered sleeping 0  1  Tired, decreased energy 2  1  Change in appetite 0  1  Feeling bad or failure about yourself  1  0  Trouble concentrating 2  0  Moving slowly or fidgety/restless 1  0  Suicidal thoughts 0  0  PHQ-9 Score 8  5  Difficult doing work/chores Somewhat difficult  Somewhat difficult       09/09/2021    9:51 AM 09/16/2019    1:06 PM 08/19/2019    9:14 AM 08/08/2019    3:21 PM  GAD 7 : Generalized Anxiety Score  Nervous, Anxious, on Edge _2 Control/stop worrying _3 0  Worry too much - different things _4 Trouble relaxing _5 Restless 0 1 0 0  Easily annoyed or irritable 0 0 1 1  Afraid - awful might happen 0 0 1 1  Total GAD 7 Score _6 Anxiety  Difficulty   Somewhat difficult Somewhat difficult    Social History   Tobacco Use  Smoking Status Never  Smokeless Tobacco Never   BP Readings from Last 3 Encounters:  03/07/22 120/70  02/28/22 (!) 143/77  12/16/21 110/70   Pulse Readings from Last 3 Encounters:  03/07/22  72  02/28/22 83  12/16/21 80   Wt Readings from Last 3 Encounters:  03/07/22 140 lb 8 oz (63.7 kg)  02/28/22 135 lb (61.2 kg)  01/03/22 138 lb (62.6 kg)   BMI Readings from Last 3 Encounters:  03/07/22 29.36 kg/m  02/28/22 28.22 kg/m  01/03/22 28.84 kg/m    Assessment/Interventions: Review of patient past medical history, allergies, medications, health status, including review of consultants reports, laboratory and other test data, was performed as part of comprehensive evaluation and provision of chronic care management services.   SDOH:  (Social Determinants of Health) assessments and interventions performed: Yes SDOH Interventions    Flowsheet Row Clinical Support from 01/03/2022 in [place] at [place] Management from 07/12/2021 in [place] at [facility] Visit from 07/01/2019 in [place] at [place] Interventions Intervention Not Indicated Intervention Not Indicated --  Housing Interventions Intervention Not Indicated -- --  Transportation Interventions Intervention Not Indicated -- --  Depression Interventions/Treatment  -- -- Medication  Financial Strain Interventions Intervention Not Indicated Intervention Not Indicated --  Physical Activity Interventions Intervention Not Indicated -- --  Stress Interventions Intervention Not Indicated -- --  Social Connections Interventions Intervention Not Indicated -- --       [place]: No Food Insecurity (01/03/2022)  Housing: Low Risk  (01/03/2022)  Transportation Needs: No Transportation Needs (01/03/2022)  Alcohol Screen: Low  Risk  (01/03/2022)  Depression (PHQ2-9): Medium Risk (03/07/2022)  Financial Resource Strain: Low Risk  (01/03/2022)  Physical Activity: Insufficiently Active (01/03/2022)  Social Connections: Socially Integrated (01/03/2022)  Stress: No Stress Concern Present (01/03/2022)  Tobacco Use: Low Risk  (03/07/2022)    [place]  Allergies  Allergen Reactions   Shellfish Allergy Hives and Other (See Comments)    CHEST PAIN AND ITCHINESS (INTERNAL--MOUTH THRU GI TRACT)  WITH NAUSEA    Medications Reviewed Today     Reviewed by Charlton Haws, [facility] (Pharmacist) on 04/26/22 at [place] List Status: <None>   Medication Order Taking? Sig Documenting Provider Last Dose Status Informant  amLODipine (NORVASC) 2.5 MG tablet [id number] Yes Take 2 tablets by mouth once daily Bedsole, Amy E, MD Taking Active   latanoprost (XALATAN) 0.005 % ophthalmic solution [id number] Yes Place 1 drop into both eyes at bedtime. [provider] Taking Active Multiple Informants  mirtazapine (REMERON) 15 MG tablet [id number] Yes Take 7.5 mg by mouth at bedtime. [provider] Taking Active   Multiple Vitamin (MULTIVITAMIN) capsule [id number] Yes Take 1 capsule by mouth daily. [provider] Taking Active Multiple Informants           Med Note Delsa Sale, [place] Aug 24, 2020 10:16 AM)    venlafaxine XR (EFFEXOR-XR) 37.5 MG 24 hr capsule [id number] Yes Take 1 capsule (37.5 mg total) by mouth daily with breakfast. Jinny Sanders, MD Taking Active             Patient Active Problem List   Diagnosis Date Noted   Itching 12/16/2021   Bilateral knee pain 03/01/2021   Dizziness 02/17/2021   Hypercholesteremia 12/31/2020   Alzheimer disease ([place]) 09/10/2020   Acute pain of both shoulders 01/23/2020   Essential hypertension 05/14/2019   Arthralgia 01/21/2019   GAD (generalized anxiety disorder) 10/30/2017   MDD (major depressive disorder), single episode, mild ([place]) 10/30/2017    Chronic insomnia 10/30/2017   Allergic rhinitis  10/05/2016   Osteopenia 11/18/2015   Chronic constipation 07/20/2015   Internal hemorrhoid, bleeding 07/20/2015   Counseling regarding end of life decision making 09/08/2014   Gastroesophageal reflux disease without esophagitis 08/13/2014   Irritable bowel syndrome with constipation 08/13/2014   Hx of migraines 08/13/2014   Family history of colon cancer 08/13/2014   Family history of uterine cancer 08/13/2014   Family history of breast cancer 08/13/2014    Immunization History  Administered Date(s) Administered   PFIZER(Purple Top)SARS-COV-2 Vaccination 09/06/2019, 09/30/2019, 06/05/2020   PNEUMOCOCCAL CONJUGATE-20 12/31/2020    Conditions to be addressed/monitored:  Hypertension, Hyperlipidemia, Depression, Anxiety, and Osteopenia  Care Plan : [place]  Updates made by Charlton Haws, [place] since 04/26/2022 12:00 AM     Problem: Hypertension, Hyperlipidemia, Depression, Anxiety, and Osteopenia   Priority: High     Long-Range Goal: Disease mgmt   Start Date: 07/14/2021  Expected End Date: 07/14/2022  Recent Progress: On track  Priority: High  Note:   Current Barriers:  Memory issues  Pharmacist Clinical Goal(s):  Patient will contact provider office for questions/concerns as evidenced notation of same in electronic health record through collaboration with PharmD and provider.   Interventions: 1:1 collaboration with Jinny Sanders, MD regarding development and update of comprehensive plan of care as evidenced by provider attestation and co-signature Inter-disciplinary care team collaboration (see longitudinal plan of care) Comprehensive medication review performed; medication list updated in electronic medical record  Hypertension (BP goal <140/90) -Controlled - pt endorses compliance with medication and home BP is at goal -Current home BP readings: 135/69, P 76 -Current treatment: Amlodipine 2.5 mg - 2  tab daily - Appropriate, Effective, Safe, Accessible -Denies hypotensive/hypertensive symptoms -Educated on BP goals and benefits of medications for prevention of heart attack, stroke and kidney damage; -Counseled to monitor BP at home periodically, document, and provide log at future appointments -Recommended to continue current medication  Hyperlipidemia: (LDL goal < 100) -Not ideally controlled - pt and PCP have agreed to hold off on atorvastatin in interest of simplifying medication regimen -Current treatment: None -Medications previously tried: atorvastatin  -Educated on Cholesterol goals; Benefits of statin for ASCVD risk reduction;  Depression/Anxiety (Goal: manage symptoms) -Not ideally controlled - pt went to ED recently for anxiety; it appears she was probably not taking mirtazapine at that time (based on fill dates); she now endorses compliance with 1/2 tab of mirtazapine at night and reports anxiety is a little better; she reports she is sleeping fine -PHQ9: 2 (12/2020) - minimal depression -GAD7: 10 (08/2018) - moderate anxiety -Connected with Dr Nicolasa Ducking for mental health support -Current treatment: Venlafaxine ER 37.5 mg daily - Appropriate, Effective, Safe, Accessible Mirtazapine 15 mg -1/2 tab HS - Appropriate, Effective, Safe, Accessible -Medications previously tried/failed: sertraline, citalopram, trazodone, hydroxyzine -Educated on Benefits of medication for symptom control -Recommend to continue current medication  Alzheimer's Disease (Goal: slow progression) -Not ideally controlled - pt has been off donepezil for a while, has not wanted to add any medications due to desire to simplify regimen -Current treatment  None -Medications previously tried: donepezil -Discussed benefits of donepezil for slowing memory decline; consider restarting donepezil in future  Osteopenia (Goal prevent fractures) -Not ideally controlled - pt is due for repeat DEXA scan; she is not taking  calcium or vitamin D -Last DEXA Scan: 11/18/2015   T-Score femoral neck: -1.6  T-Score lumbar spine: -1.3  10-year probability of major osteoporotic fracture: 5.5%  10-year probability of hip fracture: 1.2% -  Patient is not a candidate for pharmacologic treatment -Current treatment  None -Medications previously tried: n/a  -Recommend [phone] units of vitamin D daily. Recommend 1200 mg of calcium daily from dietary and supplemental sources. -Recommend repeat DEXA scan this year  Patient Goals/Self-Care Activities Patient will:  - take medications as prescribed as evidenced by patient report and record review     Medication Assistance: None required.  Patient affirms current coverage meets needs.  Compliance/Adherence/Medication fill history: Care Gaps: None  Star-Rating Drugs: None  Medication Access: Within the past 30 days, how often has patient missed a dose of medication? 0 Is a pillbox or other method used to improve adherence? Yes  Factors that may affect medication adherence?  Memory issues Are meds synced by current pharmacy? No  Are meds delivered by current pharmacy? No  Does patient experience delays in picking up medications due to transportation concerns? No   Upstream Services Reviewed: Is patient disadvantaged to use [facility]?: No  Current Rx insurance plan: HTA Name and location of Current pharmacy:  [facility], [place] [address] [place] [address] Phone: [phone] Fax: [phone]  [facility] services reviewed with patient today?: No  Patient requests to transfer care to Up[facility] Pharmacy?: No  Reason patient declined to change pharmacies: Too few medications to benefit from sync/delivery  Care Plan and Follow Up Patient Decision:  Patient agrees to Care Plan and Follow-up.  Plan: The patient has been provided with contact information for the care management team and has been advised to  call with any health related questions or concerns.   Charlene Brooke, PharmD, BCACP Clinical Pharmacist [facility] at [facility] [phone]

## 2022-05-04 ENCOUNTER — Ambulatory Visit
Admission: EM | Admit: 2022-05-04 | Discharge: 2022-05-04 | Disposition: A | Discharge status: Home / Self Care | Payer: PPO | Attending: Urgent Care | Admitting: Urgent Care

## 2022-05-04 DIAGNOSIS — J029 Acute pharyngitis, unspecified: Secondary | ICD-10-CM

## 2022-05-04 LAB — POCT RAPID STREP A (OFFICE): Rapid Strep A Screen: NEGATIVE

## 2022-05-04 NOTE — ED Triage Notes (Signed)
Pt. Presents to UC w/ c/o nausea and dizziness that started this morning. Pt. Has hx of vertigo.

## 2022-05-04 NOTE — ED Provider Notes (Signed)
Jessica Hardin    CSN: [id number] Arrival date & time: 05/04/22  1307      History   Chief Complaint Chief Complaint  Patient presents with   Dizziness   Nausea    HPI Jessica Hardin is a 81 y.o. female.    Dizziness   Accompanied by her husband.  She presents to UC with complaint of sore throat that is accompanied by swollen lymph node on the right side of her neck.  She also reports some nausea and dizziness.  Primary concern today is the sore throat and swollen lymph node.  She endorses a history of vertigo.  Past Medical History:  Diagnosis Date   Allergy    Anxiety    Asthma    GERD (gastroesophageal reflux disease)    History of chicken pox    IBS (irritable bowel syndrome)    Migraine    Urinary tract infection     Patient Active Problem List   Diagnosis Date Noted   Itching 12/16/2021   Bilateral knee pain 03/01/2021   Dizziness 02/17/2021   Hypercholesteremia 12/31/2020   Alzheimer disease ([place]) 09/10/2020   Acute pain of both shoulders 01/23/2020   Essential hypertension 05/14/2019   Arthralgia 01/21/2019   GAD (generalized anxiety disorder) 10/30/2017   MDD (major depressive disorder), single episode, mild ([facility]) 10/30/2017   Chronic insomnia 10/30/2017   Allergic rhinitis 10/05/2016   Osteopenia 11/18/2015   Chronic constipation 07/20/2015   Internal hemorrhoid, bleeding 07/20/2015   Counseling regarding end of life decision making 09/08/2014   Gastroesophageal reflux disease without esophagitis 08/13/2014   Irritable bowel syndrome with constipation 08/13/2014   Hx of migraines 08/13/2014   Family history of colon cancer 08/13/2014   Family history of uterine cancer 08/13/2014   Family history of breast cancer 08/13/2014    Past Surgical History:  Procedure Laterality Date   ABDOMINAL HYSTERECTOMY     CATARACT EXTRACTION, BILATERAL     TUBAL LIGATION      OB History   No obstetric history on file.      Home  Medications    Prior to Admission medications   Medication Sig Start Date End Date Taking? Authorizing Provider  amLODipine (NORVASC) 2.5 MG tablet Take 2 tablets by mouth once daily 07/21/21   Bedsole, Amy E, MD  latanoprost (XALATAN) 0.005 % ophthalmic solution Place 1 drop into both eyes at bedtime.    [provider]  mirtazapine (REMERON) 15 MG tablet Take 7.5 mg by mouth at bedtime.    [provider]  Multiple Vitamin (MULTIVITAMIN) capsule Take 1 capsule by mouth daily.    [provider]  venlafaxine XR (EFFEXOR-XR) 37.5 MG 24 hr capsule Take 1 capsule (37.5 mg total) by mouth daily with breakfast. 02/21/22   Jinny Sanders, MD    Family History Family History  Problem Relation Age of Onset   Arthritis Mother    Hypertension Mother    Arthritis Sister    Hypertension Sister    Colon cancer Brother 28   Breast cancer Sister    Breast cancer Sister    Uterine cancer Sister 78   Multiple sclerosis Daughter     Social History Social History   Tobacco Use   Smoking status: Never   Smokeless tobacco: Never  Vaping Use   Vaping Use: Never used  Substance Use Topics   Alcohol use: No    Alcohol/week: 0.0 standard drinks of alcohol   Drug use: No  Allergies   Shellfish allergy   Review of Systems Review of Systems  Neurological:  Positive for dizziness.     Physical Exam Triage Vital Signs ED Triage Vitals [05/04/22 1347]  Enc Vitals Group     BP (!) 142/90     Pulse Rate 79     Resp 18     Temp 98.2 F (36.8 C)     Temp src      SpO2 96 %     Weight      Height      Head Circumference      Peak Flow      Pain Score 0     Pain Loc      Pain Edu?      Excl. in [place]?    No data found.  Updated Vital Signs BP (!) 142/90   Pulse 79   Temp 98.2 F (36.8 C)   Resp 18   SpO2 96%   Visual Acuity Right Eye Distance:   Left Eye Distance:   Bilateral Distance:    Right Eye Near:   Left Eye Near:    Bilateral Near:      Physical Exam HENT:     Mouth/Throat:     Pharynx: Posterior oropharyngeal erythema present. No oropharyngeal exudate.  Eyes:     Conjunctiva/sclera: Conjunctivae normal.     Pupils: Pupils are equal, round, and reactive to light.  Cardiovascular:     Rate and Rhythm: Normal rate and regular rhythm.     Pulses: Normal pulses.     Heart sounds: Normal heart sounds.  Pulmonary:     Effort: Pulmonary effort is normal.     Breath sounds: Normal breath sounds.  Lymphadenopathy:     Cervical: Cervical adenopathy present.      UC Treatments / Results  Labs (all labs ordered are listed, but only abnormal results are displayed) Labs Reviewed - No data to display  EKG   Radiology No results found.  Procedures Procedures (including critical care time)  Medications Ordered in UC Medications - No data to display  Initial Impression / Assessment and Plan / UC Course  I have reviewed the triage vital signs and the nursing notes.  Pertinent labs & imaging results that were available during my care of the patient were reviewed by me and considered in my medical decision making (see chart for details).   Patient is afebrile here without recent antipyretics. Satting well on room air. Overall is well appearing, well hydrated, without respiratory distress. Pulmonary exam is unremarkable.  Lungs CTAB.  Mild pharyngeal erythema without peritonsillar exudates.  Rapid strep is negative.  Suspect viral pathogen as the source of her sore throat.  Explained the swollen lymph node was likely her body's immune response and it would resolve without treatment.  She declines treatment for the dizziness (with her husband) and says she will discuss it with her primary care provider.    Final Clinical Impressions(s) / UC Diagnoses   Final diagnoses:  None   Discharge Instructions   None    ED Prescriptions   None    PDMP not reviewed this encounter.   Rose Phi, [place] 05/04/22  1441

## 2022-05-04 NOTE — Discharge Instructions (Addendum)
Please follow-up with your primary care provider regarding treatment for your dizziness associated with vertigo.

## 2022-05-06 ENCOUNTER — Telehealth: Payer: Self-pay | Admitting: Family Medicine

## 2022-05-06 DIAGNOSIS — M8580 Other specified disorders of bone density and structure, unspecified site: Secondary | ICD-10-CM

## 2022-05-06 DIAGNOSIS — E7800 Pure hypercholesterolemia, unspecified: Secondary | ICD-10-CM

## 2022-05-06 NOTE — Telephone Encounter (Signed)
-----   Message from Velna Hatchet, RT sent at 05/03/2022 11:19 AM EST ----- Regarding: Thur 11/30 lab Patient is scheduled for cpx, please order future labs.  Thanks, Anda Kraft

## 2022-05-18 ENCOUNTER — Other Ambulatory Visit (INDEPENDENT_AMBULATORY_CARE_PROVIDER_SITE_OTHER): Payer: PPO

## 2022-05-18 DIAGNOSIS — E7800 Pure hypercholesterolemia, unspecified: Secondary | ICD-10-CM | POA: Diagnosis not present

## 2022-05-18 DIAGNOSIS — M8580 Other specified disorders of bone density and structure, unspecified site: Secondary | ICD-10-CM | POA: Diagnosis not present

## 2022-05-18 LAB — COMPREHENSIVE METABOLIC PANEL
ALT: 15 U/L (ref 0–35)
AST: 22 U/L (ref 0–37)
Albumin: 3.9 g/dL (ref 3.5–5.2)
Alkaline Phosphatase: 75 U/L (ref 39–117)
BUN: 13 mg/dL (ref 6–23)
CO2: 33 mEq/L — ABNORMAL HIGH (ref 19–32)
Calcium: 9 mg/dL (ref 8.4–10.5)
Chloride: 103 mEq/L (ref 96–112)
Creatinine, Ser: 0.99 mg/dL (ref 0.40–1.20)
GFR: 53.03 mL/min — ABNORMAL LOW (ref >60.00)
Glucose, Bld: 83 mg/dL (ref 70–99)
Potassium: 3.8 mEq/L (ref 3.5–5.1)
Sodium: 141 mEq/L (ref 135–145)
Total Bilirubin: 0.3 mg/dL (ref 0.2–1.2)
Total Protein: 7.4 g/dL (ref 6.0–8.3)

## 2022-05-18 LAB — VITAMIN D 25 HYDROXY (VIT D DEFICIENCY, FRACTURES): VITD: 25.7 ng/mL — ABNORMAL LOW (ref 30.00–100.00)

## 2022-05-18 LAB — LIPID PANEL
Cholesterol: 196 mg/dL (ref 0–200)
HDL: 70.8 mg/dL (ref >39.00)
LDL Cholesterol: 106 mg/dL — ABNORMAL HIGH (ref 0–99)
NonHDL: 125.26
Total CHOL/HDL Ratio: 3
Triglycerides: 96 mg/dL (ref 0.0–149.0)
VLDL: 19.2 mg/dL (ref 0.0–40.0)

## 2022-05-18 NOTE — Progress Notes (Signed)
No critical labs need to be addressed urgently. We will discuss labs in detail at upcoming office visit.   

## 2022-05-25 ENCOUNTER — Encounter: Payer: Self-pay | Admitting: Family Medicine

## 2022-05-25 ENCOUNTER — Encounter: Payer: PPO | Admitting: Family Medicine

## 2022-05-25 ENCOUNTER — Ambulatory Visit (INDEPENDENT_AMBULATORY_CARE_PROVIDER_SITE_OTHER): Payer: PPO | Admitting: Family Medicine

## 2022-05-25 VITALS — BP 120/60 | HR 79 | Temp 98.6°F | Ht <= 58 in | Wt 141.2 lb

## 2022-05-25 DIAGNOSIS — F320 Major depressive disorder, single episode, mild: Secondary | ICD-10-CM | POA: Diagnosis not present

## 2022-05-25 DIAGNOSIS — E559 Vitamin D deficiency, unspecified: Secondary | ICD-10-CM | POA: Diagnosis not present

## 2022-05-25 DIAGNOSIS — F411 Generalized anxiety disorder: Secondary | ICD-10-CM | POA: Diagnosis not present

## 2022-05-25 DIAGNOSIS — G309 Alzheimer's disease, unspecified: Secondary | ICD-10-CM | POA: Diagnosis not present

## 2022-05-25 DIAGNOSIS — F0280 Dementia in other diseases classified elsewhere without behavioral disturbance: Secondary | ICD-10-CM

## 2022-05-25 DIAGNOSIS — E7800 Pure hypercholesterolemia, unspecified: Secondary | ICD-10-CM | POA: Diagnosis not present

## 2022-05-25 DIAGNOSIS — I10 Essential (primary) hypertension: Secondary | ICD-10-CM

## 2022-05-25 DIAGNOSIS — N644 Mastodynia: Secondary | ICD-10-CM | POA: Diagnosis not present

## 2022-05-25 MED ORDER — VITAMIN D (ERGOCALCIFEROL) 1.25 MG (50000 UNIT) PO CAPS: 50000.0000 [IU] | ORAL_CAPSULE | ORAL | Status: AC

## 2022-05-25 NOTE — Progress Notes (Signed)
Patient ID: Jessica Hardin, female    DOB: 12/17/1940, 81 y.o.   MRN: [id number]  This visit was conducted in person.  BP 120/60   Pulse 79   Temp 98.6 F (37 C) (Oral)   Ht '4\' 10"'$  (1.473 m)   Wt 141 lb 4 oz (64.1 kg)   SpO2 98%   BMI 29.52 kg/m    CC: Chief Complaint  Patient presents with   Follow-up    Subjective:   HPI: Jessica Hardin is a 81 y.o. female presenting on 05/25/2022 for Follow-up  At last OV 02/2022:  GAD (generalized anxiety disorder)       Chronic... discussed finding hobby, exercise, volunteering etc would likely help her with purpose and excess energy. No clear need for med change... continue low dose venlafaxine 37.5 mg daily and remeron 15 mg at bedtime.         Today she reports  feeling of jitteriness in stomach she associates with anxiety.  No abd pain, no fever, no reflux. No Diarrhea or constipation.   Husband states she is not eating with the venlafaxine.  Skips breakfast.  Elevated Cholesterol:  Lab Results  Component Value Date   CHOL 196 05/18/2022   HDL 70.80 05/18/2022   LDLCALC 106 (H) 05/18/2022   TRIG 96.0 05/18/2022   CHOLHDL 3 05/18/2022  Using medications without problems:none Muscle aches: none Diet compliance: moderate Exercise:none Other complaints:  Hypertension:   Well controled on amlodipine 2.5 mg daily Using medication without problems or lightheadedness:  .none Chest pain with exertion:none Edema:none Short of breath:none Average home BPs: Other issues:  Right breast tenderness... has upcoming scheduled routine mammogram.     Wt Readings from Last 3 Encounters:  05/25/22 141 lb 4 oz (64.1 kg)  03/07/22 140 lb 8 oz (63.7 kg)  02/28/22 135 lb (61.2 kg)     Vit D def:  low  Relevant past medical, surgical, family and social history reviewed and updated as indicated. Interim medical history since our last visit reviewed. Allergies and medications reviewed and updated. Outpatient Medications Prior  to Visit  Medication Sig Dispense Refill   amLODipine (NORVASC) 2.5 MG tablet Take 2 tablets by mouth once daily 180 tablet 3   latanoprost (XALATAN) 0.005 % ophthalmic solution Place 1 drop into both eyes at bedtime.     mirtazapine (REMERON) 15 MG tablet Take 7.5 mg by mouth at bedtime.     Multiple Vitamin (MULTIVITAMIN) capsule Take 1 capsule by mouth daily.     venlafaxine XR (EFFEXOR-XR) 37.5 MG 24 hr capsule Take 1 capsule (37.5 mg total) by mouth daily with breakfast. 30 capsule 5   No facility-administered medications prior to visit.     Per HPI unless specifically indicated in ROS section below Review of Systems  Constitutional:  Positive for fatigue. Negative for fever and unexpected weight change.  HENT:  Negative for congestion, ear pain, sinus pressure, sneezing, sore throat and trouble swallowing.   Eyes:  Negative for pain and itching.  Respiratory:  Negative for cough, shortness of breath and wheezing.   Cardiovascular:  Negative for chest pain, palpitations and leg swelling.  Gastrointestinal:  Negative for abdominal pain, blood in stool, constipation, diarrhea and nausea.  Genitourinary:  Negative for difficulty urinating, dysuria, hematuria, menstrual problem and vaginal discharge.  Skin:  Negative for rash.  Neurological:  Negative for syncope, weakness, light-headedness, numbness and headaches.  Psychiatric/Behavioral:  Negative for confusion and dysphoric mood. The patient is nervous/anxious.  Objective:  BP 120/60   Pulse 79   Temp 98.6 F (37 C) (Oral)   Ht '4\' 10"'$  (1.473 m)   Wt 141 lb 4 oz (64.1 kg)   SpO2 98%   BMI 29.52 kg/m   Wt Readings from Last 3 Encounters:  05/25/22 141 lb 4 oz (64.1 kg)  03/07/22 140 lb 8 oz (63.7 kg)  02/28/22 135 lb (61.2 kg)      Physical Exam Vitals and nursing note reviewed.  Constitutional:      General: She is not in acute distress.    Appearance: Normal appearance. She is well-developed. She is not ill-appearing  or toxic-appearing.  HENT:     Head: Normocephalic.     Right Ear: Hearing, tympanic membrane, ear canal and external ear normal.     Left Ear: Hearing, tympanic membrane, ear canal and external ear normal.     Nose: Nose normal.  Eyes:     General: Lids are normal. Lids are everted, no foreign bodies appreciated.     Conjunctiva/sclera: Conjunctivae normal.     Pupils: Pupils are equal, round, and reactive to light.  Neck:     Thyroid: No thyroid mass or thyromegaly.     Vascular: No carotid bruit.     Trachea: Trachea normal.  Cardiovascular:     Rate and Rhythm: Normal rate and regular rhythm.     Heart sounds: Normal heart sounds, S1 normal and S2 normal. No murmur heard.    No gallop.  Pulmonary:     Effort: Pulmonary effort is normal. No respiratory distress.     Breath sounds: Normal breath sounds. No wheezing, rhonchi or rales.  Chest:  Breasts:    Left: Normal.       Comments: Diffuse tenderness and right lateral breast no mass palpated Abdominal:     General: Bowel sounds are normal. There is no distension or abdominal bruit.     Palpations: Abdomen is soft. There is no fluid wave or mass.     Tenderness: There is no abdominal tenderness. There is no guarding or rebound.     Hernia: No hernia is present.  Musculoskeletal:     Cervical back: Normal range of motion and neck supple.  Lymphadenopathy:     Cervical: No cervical adenopathy.  Skin:    General: Skin is warm and dry.     Findings: No rash.  Neurological:     Mental Status: She is alert.     Cranial Nerves: No cranial nerve deficit.     Sensory: No sensory deficit.  Psychiatric:        Mood and Affect: Mood is not anxious or depressed.        Speech: Speech normal.        Behavior: Behavior normal. Behavior is cooperative.        Judgment: Judgment normal.       Results for orders placed or performed in visit on 05/18/22  VITAMIN D 25 Hydroxy (Vit-D Deficiency, Fractures)  Result Value Ref Range    VITD 25.70 (L) 30.00 - 100.00 ng/mL  Comprehensive metabolic panel  Result Value Ref Range   Sodium 141 135 - 145 mEq/L   Potassium 3.8 3.5 - 5.1 mEq/L   Chloride 103 96 - 112 mEq/L   CO2 33 (H) 19 - 32 mEq/L   Glucose, Bld 83 70 - 99 mg/dL   BUN 13 6 - 23 mg/dL   Creatinine, Ser 0.99 0.40 - 1.20 mg/dL  Total Bilirubin 0.3 0.2 - 1.2 mg/dL   Alkaline Phosphatase 75 39 - 117 U/L   AST 22 0 - 37 U/L   ALT 15 0 - 35 U/L   Total Protein 7.4 6.0 - 8.3 g/dL   Albumin 3.9 3.5 - 5.2 g/dL   GFR 53.03 (L) >60.00 mL/min   Calcium 9.0 8.4 - 10.5 mg/dL  Lipid panel  Result Value Ref Range   Cholesterol 196 0 - 200 mg/dL   Triglycerides 96.0 0.0 - 149.0 mg/dL   HDL 70.80 >39.00 mg/dL   VLDL 19.2 0.0 - 40.0 mg/dL   LDL Cholesterol 106 (H) 0 - 99 mg/dL   Total CHOL/HDL Ratio 3    NonHDL 125.26      COVID 19 screen:  No recent travel or known exposure to COVID19 The patient denies respiratory symptoms of COVID 19 at this time. The importance of social distancing was discussed today.   Assessment and Plan    Problem List Items Addressed This Visit     Alzheimer disease ([place])    Chronic stable per husband.  Not clearly wanting medication for dementia.       Breast pain, right    Refer for diagnostic mammogram and ultrasound      Relevant Orders   US BREAST LTD UNI RIGHT INC AXILLA   US BREAST LTD UNI LEFT INC AXILLA   MM DIAG BREAST TOMO BILATERAL   Essential hypertension    Stable, chronic.  Continue current medication.  Amlodipine 2.5 mg daily      GAD (generalized anxiety disorder) - Primary    Chronic, inadequate control The venlafaxine on an empty stomach could be causing some stomach upset but she feels her symptoms are more due to poorly controlled anxiety.  Her husband states that higher doses of venlafaxine caused other issues but cannot remember what they are. I encouraged her to work on behavioral techniques to decrease anxiety such as yoga, relaxation,  exercise.  She will try to take her venlafaxine on a full stomach.      Hypercholesteremia    Chronic, tolerable control for age.       MDD (major depressive disorder), single episode, mild (HCC)    Chronic, stable control on venlafaxine.      Vitamin D deficiency    Acute Replace vitamin D with 12-week course then follow-up with multivitamin daily or over-the-counter vitamin D.        Meds ordered this encounter  Medications   Vitamin D, Ergocalciferol, (DRISDOL) 1.25 MG (50000 UNIT) CAPS capsule    Sig: Take 1 capsule (50,000 Units total) by mouth every 7 (seven) days.    Dispense:  12 capsule    Refill:  00   Orders Placed This Encounter  Procedures   US BREAST LTD UNI RIGHT INC AXILLA    Standing Status:   Future    Standing Expiration Date:   05/26/2023    Order Specific Question:   Reason for Exam (SYMPTOM  OR DIAGNOSIS REQUIRED)    Answer:   right breast pain lateral    Order Specific Question:   Preferred imaging location?    Answer:   [facility]   US BREAST LTD UNI LEFT INC AXILLA    Standing Status:   Future    Standing Expiration Date:   05/26/2023    Order Specific Question:   Reason for Exam (SYMPTOM  OR DIAGNOSIS REQUIRED)    Answer:   right breast pain lateral  Order Specific Question:   Preferred imaging location?    Answer:   [facility]   MM DIAG BREAST TOMO BILATERAL    PLEASE SIGN    Standing Status:   Future    Standing Expiration Date:   05/26/2023    Order Specific Question:   Reason for Exam (SYMPTOM  OR DIAGNOSIS REQUIRED)    Answer:   right breast tenderness lateral    Order Specific Question:   Preferred imaging location?    Answer:   [facility]     Eliezer Lofts, MD

## 2022-05-25 NOTE — Assessment & Plan Note (Signed)
Stable, chronic.  Continue current medication.  Amlodipine 2.5 mg daily

## 2022-05-25 NOTE — Assessment & Plan Note (Signed)
Chronic, inadequate control The venlafaxine on an empty stomach could be causing some stomach upset but she feels her symptoms are more due to poorly controlled anxiety.  Her husband states that higher doses of venlafaxine caused other issues but cannot remember what they are. I encouraged her to work on behavioral techniques to decrease anxiety such as yoga, relaxation, exercise.  She will try to take her venlafaxine on a full stomach.

## 2022-05-25 NOTE — Patient Instructions (Addendum)
Take capsule  ( venlafaxine ) only with a meal at the same time every day.  If stomach jitters not improving.. call or come in for increase in capsule dose (venlafaxine) Replace vitamin D with 12-week course then follow-up with multivitamin daily or over-the-counter vitamin D.

## 2022-05-25 NOTE — Assessment & Plan Note (Signed)
Chronic stable per husband.  Not clearly wanting medication for dementia.

## 2022-06-08 DIAGNOSIS — E559 Vitamin D deficiency, unspecified: Secondary | ICD-10-CM | POA: Insufficient documentation

## 2022-06-08 NOTE — Assessment & Plan Note (Signed)
Chronic, stable control on venlafaxine.

## 2022-06-08 NOTE — Assessment & Plan Note (Signed)
Chronic, tolerable control for age.

## 2022-06-08 NOTE — Assessment & Plan Note (Signed)
Refer for diagnostic mammogram and ultrasound

## 2022-06-08 NOTE — Assessment & Plan Note (Signed)
Acute Replace vitamin D with 12-week course then follow-up with multivitamin daily or over-the-counter vitamin D.

## 2022-06-22 ENCOUNTER — Encounter: Payer: PPO | Admitting: Family Medicine

## 2022-06-23 ENCOUNTER — Ambulatory Visit
Admission: RE | Admit: 2022-06-23 | Discharge: 2022-06-23 | Disposition: A | Discharge status: Home / Self Care | Payer: PPO | Source: Ambulatory Visit | Attending: Family Medicine | Admitting: Family Medicine

## 2022-06-23 DIAGNOSIS — N644 Mastodynia: Secondary | ICD-10-CM | POA: Insufficient documentation

## 2022-06-28 ENCOUNTER — Other Ambulatory Visit: Payer: Self-pay

## 2022-06-28 ENCOUNTER — Emergency Department
Admission: EM | Admit: 2022-06-28 | Discharge: 2022-06-28 | Disposition: A | Discharge status: Home / Self Care | Payer: PPO | Attending: Emergency Medicine | Admitting: Emergency Medicine

## 2022-06-28 DIAGNOSIS — R531 Weakness: Secondary | ICD-10-CM | POA: Insufficient documentation

## 2022-06-28 DIAGNOSIS — Z1152 Encounter for screening for COVID-19: Secondary | ICD-10-CM | POA: Diagnosis not present

## 2022-06-28 DIAGNOSIS — R413 Other amnesia: Secondary | ICD-10-CM | POA: Diagnosis not present

## 2022-06-28 DIAGNOSIS — F419 Anxiety disorder, unspecified: Secondary | ICD-10-CM | POA: Diagnosis not present

## 2022-06-28 DIAGNOSIS — R5383 Other fatigue: Secondary | ICD-10-CM | POA: Diagnosis present

## 2022-06-28 DIAGNOSIS — G309 Alzheimer's disease, unspecified: Secondary | ICD-10-CM | POA: Diagnosis not present

## 2022-06-28 LAB — RESP PANEL BY RT-PCR (RSV, FLU A&B, COVID)  RVPGX2
Influenza A by PCR: NEGATIVE
Influenza B by PCR: NEGATIVE
Resp Syncytial Virus by PCR: NEGATIVE
SARS Coronavirus 2 by RT PCR: NEGATIVE

## 2022-06-28 NOTE — ED Notes (Signed)
Pt Dc to home. Dc instructions reviewed with all questions answered. Pt voices understanding. Pt ambulatory out of dept with steady gait 

## 2022-06-28 NOTE — Discharge Instructions (Signed)
Call Dr. Rometta Emery office to make an appointment for follow-up within the next few weeks if possible.  At that time you can discuss the medications that you are on and whether you need any further workup or change in your medications for your symptoms.  Return to the ER immediately for new, worsening, or persistent severe symptoms including any worsened weakness or fatigue, dizziness, fever, vomiting, acute pain, urinary symptoms, confusion, difficulty walking or speaking, vision changes, worsening anxiety, thoughts of depression or wanting to harm yourself, or any other new or worsening symptoms that concern you.

## 2022-06-28 NOTE — ED Triage Notes (Signed)
Pt states that she feels "low" reports that all over her body feels bad, states that she hasn't been around anyone who has been sick that she is aware of, pt denies cough or congestion, pt states that she just got back from trip yesterday

## 2022-06-28 NOTE — ED Provider Notes (Signed)
[facility] Provider Note    Event Date/Time   First MD Initiated Contact with Patient 06/28/22 1659     (approximate)   History   Weakness   HPI  Jessica Hardin is a 82 y.o. female with a history of Alzheimer's, anxiety, asthma, GERD, IBS, and migraine who presents for symptoms of fatigue and anxiety.  Initially the patient states that she could not tell me why she came in.  She states that she does not remember what was bothering her.  Her husband was able to offer additional history.  He states that the patient often sleeps late into the day and then wakes up feeling anxious.  She reports increased memory loss over the last several months and increased anxiety.  She has sometimes missed her medications because of how late she has slept.  However none of these symptoms are acute or have changed recently.  The patient cannot specifically identify why she felt the need to come to the ED today but denies any acute symptoms at this time.  I reviewed the past medical records.  The patient most recently saw her PMD Dr. Diona Browner on 12/7 for follow-up for generalized anxiety disorder.  The patient is on Remeron and Effexor.  Lifestyle changes were discussed at that visit.  The patient and her husband state that there has not been any significant change since that PMD visit and they are scheduled for the next follow-up in July.   Physical Exam   Triage Vital Signs: ED Triage Vitals  Enc Vitals Group     BP 06/28/22 1455 (!) 165/92     Pulse Rate 06/28/22 1455 76     Resp 06/28/22 1455 18     Temp 06/28/22 1455 99.4 F (37.4 C)     Temp Source 06/28/22 1455 Oral     SpO2 06/28/22 1455 94 %     Weight 06/28/22 1458 120 lb (54.4 kg)     Height 06/28/22 1458 5' (1.524 m)     Head Circumference --      Peak Flow --      Pain Score --      Pain Loc --      Pain Edu? --      Excl. in [place]? --     Most recent vital signs: Vitals:   06/28/22 1455  BP: (!) 165/92   Pulse: 76  Resp: 18  Temp: 99.4 F (37.4 C)  SpO2: 94%     General: Alert and oriented, comfortable appearing. CV:  Good peripheral perfusion.  Resp:  Normal effort.  Abd:  No distention.  Other:  Motor intact in all extremities.  Normal gait.   ED Results / Procedures / Treatments   Labs (all labs ordered are listed, but only abnormal results are displayed) Labs Reviewed  RESP PANEL BY RT-PCR (RSV, FLU A&B, COVID)  [id number]     EKG    RADIOLOGY    PROCEDURES:  Critical Care performed: No  Procedures   MEDICATIONS ORDERED IN ED: Medications - No data to display   IMPRESSION / MDM / [place] / ED COURSE  I reviewed the triage vital signs and the nursing notes.  82 year old female with history of Alzheimer's, anxiety, and other PMH as noted above presents reporting subacute to chronic fatigue and memory loss.  She cannot identify any specific reason for coming to the ED today.  I had an extensive discussion with the patient and her husband  and they confirmed that the patient has chronic issues that were discussed at her most recent PMD visit and no significant change since that time.  She denies any fever, acute weakness, difficulty breathing, GI symptoms, or any other acute symptoms at this time.  On exam she is slightly hypertensive.  Other vital signs are normal.  The patient is well-appearing with no focal findings.  Differential diagnosis includes, but is not limited to, Alzheimer's or other dementia, generalized anxiety.  Patient's presentation is most consistent with exacerbation of chronic illness.  At this time there is no evidence of any acute medical process or an emergency medical condition.  I counseled the patient and her husband extensively.  I recommended that they follow-up with Dr. Diona Browner and determine if the patient may need any changes to her medication or possible referral to neurology or to therapy.  The patient feels well and  would like to go home.  Her husband agrees with this plan.  I gave strict return precautions and they expressed understanding.   FINAL CLINICAL IMPRESSION(S) / ED DIAGNOSES   Final diagnoses:  Memory loss  Anxiety     Rx / DC Orders   ED Discharge Orders     None        Note:  This document was prepared using Dragon voice recognition software and may include unintentional dictation errors.    Arta Silence, MD 06/28/22 [phone]

## 2022-06-28 NOTE — ED Provider Triage Note (Signed)
Emergency Medicine Provider Triage Evaluation Note  RAELEA GOSSE , a 82 y.o. female  was evaluated in triage.  Pt complains of feeling "low". Feels bad all over.  Got back from a trip yesterday.   Review of Systems  Positive:  Negative: No fever, chills, n/v, cough or congestion  Physical Exam  BP (!) 165/92 (BP Location: Right Arm)   Pulse 76   Temp 99.4 F (37.4 C) (Oral)   Resp 18   SpO2 94%  Gen:   Awake, no distress   Resp:  Normal effort .  Lungs clear bilaterally MSK:   Moves extremities without difficulty  Other:    Medical Decision Making  Medically screening exam initiated at 2:57 PM.  Appropriate orders placed.  LIGIA DUGUAY was informed that the remainder of the evaluation will be completed by another provider, this initial triage assessment does not replace that evaluation, and the importance of remaining in the ED until their evaluation is complete.     Johnn Hai, PA-C 06/28/22 1500

## 2022-07-07 ENCOUNTER — Telehealth: Payer: Self-pay

## 2022-07-07 NOTE — Telephone Encounter (Signed)
-----  Message from [place], [facility] sent at 07/05/2022  4:38 PM EST ----- Regarding: RE: Hospital F/U Please call patient - see how she is doing, does she want to schedule an earlier appt with Dr Diona Browner to discuss the recent ED visit? Next appt will be July otherwise.   ----- Message ----- From: Marijean Niemann, CMA Sent: 07/04/2022   8:57 AM EST To: Charlton Haws, [place] Subject: Hospital F/U                                   Patient is actively enrolled in Care Management and Care Coordination with PharmD: Yes  Admitted to the ED on 06/28/2022. Discharge date was 06/28/2022  Discharged from [facility].   Discharge diagnosis (Principal Problem): Memory Loss Patient was discharged to: Home  Brief summary of hospital course: 82 year old female with history of Alzheimer's, anxiety, and other PMH as noted above presents reporting subacute to chronic fatigue and memory loss.  She cannot identify any specific reason for coming to the ED today.  I had an extensive discussion with the patient and her husband and they confirmed that the patient has chronic issues that were discussed at her most recent PMD visit and no significant change since that time.  She denies any fever, acute weakness, difficulty breathing, GI symptoms, or any other acute symptoms at this time.  On exam she is slightly hypertensive.  Other vital signs are normal.  The patient is well-appearing with no focal findings. Differential diagnosis includes, but is not limited to, Alzheimer's or other dementia, generalized anxiety.   Patient's presentation is most consistent with exacerbation of chronic illness.   At this time there is no evidence of any acute medical process or an emergency medical condition.  I counseled the patient and her husband extensively.  I recommended that they follow-up with Dr. Diona Browner and determine if the patient may need any changes to her medication or possible referral to neurology or  to therapy.   The patient feels well and would like to go home.  Her husband agrees with this plan.  I gave strict return precautions and they expressed understanding.  Medications that remain the same after Hospital Discharge:??  -All other medications will remain the same.    Has patient been contacted by Transitions of Care team? No Has patient seen PCP/specialist for hospital follow up (summarize OV if yes): No  Other upcoming appts: No Scheduled  Charlene Brooke, PharmD notified  Marijean Niemann, [place] Assistant [phone]  '

## 2022-07-07 NOTE — Progress Notes (Signed)
Called patient; no answer; left message.  Lindsey Foltanski, PharmD notified  Jeptha Hinnenkamp, RMA Clinical Pharmacy Assistant [phone]   

## 2022-07-10 ENCOUNTER — Telehealth: Payer: Self-pay

## 2022-07-10 NOTE — Telephone Encounter (Signed)
     Patient  visit on 06/28/2022  at [facility] was for Memory loss.  Have you been able to follow up with your primary care physician? No.  Patient's spouse stated they would follow up if needed.  The patient was or was not able to obtain any needed medicine or equipment. No medications prescribed.  Are there diet recommendations that you are having difficulty following?No  Patient expresses understanding of discharge instructions and education provided has no other needs at this time.    [place] [facility] Care Guide   [email or web address]  ?? [phone]   Website: [email or web address]  [email or web address]

## 2022-07-17 ENCOUNTER — Other Ambulatory Visit: Payer: Self-pay | Admitting: Family Medicine

## 2022-08-01 ENCOUNTER — Other Ambulatory Visit: Payer: Self-pay | Admitting: Family Medicine

## 2022-08-01 DIAGNOSIS — H40003 Preglaucoma, unspecified, bilateral: Secondary | ICD-10-CM | POA: Diagnosis not present

## 2022-08-08 DIAGNOSIS — H40003 Preglaucoma, unspecified, bilateral: Secondary | ICD-10-CM | POA: Diagnosis not present

## 2022-11-29 ENCOUNTER — Ambulatory Visit (INDEPENDENT_AMBULATORY_CARE_PROVIDER_SITE_OTHER): Payer: PPO | Admitting: Family Medicine

## 2022-11-29 ENCOUNTER — Ambulatory Visit: Payer: PPO | Admitting: Family Medicine

## 2022-11-29 ENCOUNTER — Encounter: Payer: Self-pay | Admitting: Family Medicine

## 2022-11-29 VITALS — BP 126/62 | HR 83 | Temp 97.9°F | Ht <= 58 in | Wt 135.1 lb

## 2022-11-29 DIAGNOSIS — R5383 Other fatigue: Secondary | ICD-10-CM

## 2022-11-29 NOTE — Progress Notes (Signed)
Jessica Hardin T. Jessica Vanderslice, MD, CAQ Sports Medicine [facility] [address] [place], [address]  Phone: [phone]  FAX: [phone]  Jessica Hardin - 82 y.o. female  MRN [id number]  Date of Birth: Sep 24, 1940  Date: 11/29/2022  PCP: Jessica Seltzer, MD  Referral: Jessica Seltzer, MD  Chief Complaint  Patient presents with   Dizziness   Memory Loss   Subjective:   [name] is a 82 y.o. very pleasant female patient with Body mass index is 28.24 kg/m. who presents with the following:  Missed appointment with Dr. Ermalene Hardin this morning.  I am not exactly clear what happened, but it sounds as if the front office may have just rescheduled her for me later in the day.  Not really having any dizzy spells.  The notes indicate that she has been having dizziness, but when I discussed with her she is not really having any dizziness.  2 teeth broke off in the back R side.  Does not feel all that well.  She plans to go to the dentist.  Not eating much in the day.  Feels out of it.  Her husband thinks she does get tired somewhat during the day  Certain things she will remember, and some things she will not remember.  She does have known Alzheimer's disease  Will not exercise.  Not much - not much.  Minimal walking and minimal exercise  Anxiety is basically doing ok.   Likes oxtail, rice, plantains.  Not eating much.     Review of Systems is noted in the HPI, as appropriate  Objective:   BP 126/62 (BP Location: Left Arm, Patient Position: Sitting, Cuff Size: Normal)   Pulse 83   Temp 97.9 F (36.6 C) (Temporal)   Ht 4\' 10"  (1.473 m)   Wt 135 lb 2 oz (61.3 kg)   SpO2 96%   BMI 28.24 kg/m   GEN: No acute distress; alert,appropriate. PULM: Breathing comfortably in no respiratory distress PSYCH: Normally interactive.  CV: RRR, no m/g/r  PULM: Normal respiratory rate, no accessory muscle use. No wheezes, crackles or rhonchi    Laboratory and Imaging Data: Lab Review:     Latest Ref Rng & Units 12/16/2021    9:19 AM 08/11/2021    3:41 PM 01/18/2021    7:49 PM  CBC EXTENDED  WBC 4.0 - 10.5 K/uL 3.0  3.7  4.7   RBC 3.87 - 5.11 Mil/uL 3.97  4.21  4.13   Hemoglobin 12.0 - 15.0 g/dL 69.6  29.5  28.4   HCT 36.0 - 46.0 % 37.8  39.6  39.9   Platelets 150.0 - 400.0 K/uL 183.0  190  180   NEUT# 1.4 - 7.7 K/uL 1.4     Lymph# 0.7 - 4.0 K/uL 1.2          Latest Ref Rng & Units 05/18/2022    7:57 AM 12/16/2021    9:19 AM 08/11/2021    3:41 PM  BMP  Glucose 70 - 99 mg/dL 83  132  440   BUN 6 - 23 mg/dL 13  10  12    Creatinine 0.40 - 1.20 mg/dL 1.02  7.25  3.66   Sodium 135 - 145 mEq/L 141  142  141   Potassium 3.5 - 5.1 mEq/L 3.8  3.7  3.8   Chloride 96 - 112 mEq/L 103  106  105   CO2 19 - 32 mEq/L 33  32  27   Calcium 8.4 - 10.5 mg/dL 9.0  9.0  9.2        Latest Ref Rng & Units 05/18/2022    7:57 AM 12/16/2021    9:19 AM 12/24/2020    8:05 AM  Hepatic Function  Total Protein 6.0 - 8.3 g/dL 7.4  6.8  7.5   Albumin 3.5 - 5.2 g/dL 3.9  3.8  4.1   AST 0 - 37 U/L 22  19  25    ALT 0 - 35 U/L 15  13  18    Alk Phosphatase 39 - 117 U/L 75  71  74   Total Bilirubin 0.2 - 1.2 mg/dL 0.3  0.4  0.4     Lab Results  Component Value Date   CHOL 196 05/18/2022   Lab Results  Component Value Date   HDL 70.80 05/18/2022   Lab Results  Component Value Date   LDLCALC 106 (H) 05/18/2022   Lab Results  Component Value Date   TRIG 96.0 05/18/2022   Lab Results  Component Value Date   CHOLHDL 3 05/18/2022   No results for input(s): "PSA" in the last 72 hours. No results found for: "HCVAB" Lab Results  Component Value Date   VD25OH 25.70 (L) 05/18/2022   VD25OH 36.74 08/30/2018   VD25OH 32.44 02/01/2017     No results found for: "HGBA1C" Lab Results  Component Value Date   LDLCALC 106 (H) 05/18/2022   CREATININE 0.99 05/18/2022     Assessment and Plan:     ICD-10-CM   1. Other fatigue  R53.83       She really has only minimal complaints about feeling tired sometimes.  She does not feel all that well in general, but there is really been nothing new or different.  I think that she inadvertently got placed on my schedule and somehow got canceled in this same day appointment with her primary care doctor.  She is in a follow-up with her in several weeks.  Disposition: No follow-ups on file.  Dragon [employer] One speech-to-text software was used for transcription in this dictation.  Possible transcriptional errors can occur using Animal nutritionist.   Signed,  Jessica Galea. James Lafalce, MD   Outpatient Encounter Medications as of 11/29/2022  Medication Sig   amLODipine (NORVASC) 2.5 MG tablet Take 2 tablets by mouth once daily   latanoprost (XALATAN) 0.005 % ophthalmic solution Place 1 drop into both eyes at bedtime.   mirtazapine (REMERON) 15 MG tablet Take 7.5 mg by mouth at bedtime.   Multiple Vitamin (MULTIVITAMIN) capsule Take 1 capsule by mouth daily.   venlafaxine XR (EFFEXOR-XR) 37.5 MG 24 hr capsule TAKE 1 CAPSULE BY MOUTH ONCE DAILY WITH BREAKFAST   Vitamin D, Ergocalciferol, (DRISDOL) 1.25 MG (50000 UNIT) CAPS capsule Take 1 capsule (50,000 Units total) by mouth every 7 (seven) days.   No facility-administered encounter medications on file as of 11/29/2022.

## 2022-12-12 ENCOUNTER — Telehealth: Payer: Self-pay | Admitting: *Deleted

## 2022-12-12 DIAGNOSIS — E559 Vitamin D deficiency, unspecified: Secondary | ICD-10-CM

## 2022-12-12 DIAGNOSIS — E7800 Pure hypercholesterolemia, unspecified: Secondary | ICD-10-CM

## 2022-12-12 NOTE — Telephone Encounter (Signed)
-----   Message from Alvina Chou sent at 12/12/2022 11:11 AM EDT ----- Regarding: Lab orders for Tuesday, 7.2.24 Patient is scheduled for CPX labs, please order future labs, Thanks , Camelia Eng

## 2022-12-19 ENCOUNTER — Other Ambulatory Visit (INDEPENDENT_AMBULATORY_CARE_PROVIDER_SITE_OTHER): Payer: PPO

## 2022-12-19 DIAGNOSIS — E7800 Pure hypercholesterolemia, unspecified: Secondary | ICD-10-CM

## 2022-12-19 DIAGNOSIS — E559 Vitamin D deficiency, unspecified: Secondary | ICD-10-CM

## 2022-12-19 LAB — LIPID PANEL
Cholesterol: 190 mg/dL (ref 0–200)
HDL: 66.9 mg/dL (ref >39.00)
LDL Cholesterol: 110 mg/dL — ABNORMAL HIGH (ref 0–99)
NonHDL: 123.46
Total CHOL/HDL Ratio: 3
Triglycerides: 65 mg/dL (ref 0.0–149.0)
VLDL: 13 mg/dL (ref 0.0–40.0)

## 2022-12-19 LAB — COMPREHENSIVE METABOLIC PANEL
ALT: 11 U/L (ref 0–35)
AST: 19 U/L (ref 0–37)
Albumin: 4 g/dL (ref 3.5–5.2)
Alkaline Phosphatase: 76 U/L (ref 39–117)
BUN: 10 mg/dL (ref 6–23)
CO2: 28 mEq/L (ref 19–32)
Calcium: 9.8 mg/dL (ref 8.4–10.5)
Chloride: 102 mEq/L (ref 96–112)
Creatinine, Ser: 0.91 mg/dL (ref 0.40–1.20)
GFR: 58.44 mL/min — ABNORMAL LOW (ref >60.00)
Glucose, Bld: 110 mg/dL — ABNORMAL HIGH (ref 70–99)
Potassium: 3.3 mEq/L — ABNORMAL LOW (ref 3.5–5.1)
Sodium: 139 mEq/L (ref 135–145)
Total Bilirubin: 0.5 mg/dL (ref 0.2–1.2)
Total Protein: 7.9 g/dL (ref 6.0–8.3)

## 2022-12-19 LAB — VITAMIN D 25 HYDROXY (VIT D DEFICIENCY, FRACTURES): VITD: 45.83 ng/mL (ref 30.00–100.00)

## 2022-12-19 NOTE — Progress Notes (Signed)
No critical labs need to be addressed urgently. We will discuss labs in detail at upcoming office visit.   

## 2022-12-26 ENCOUNTER — Ambulatory Visit (INDEPENDENT_AMBULATORY_CARE_PROVIDER_SITE_OTHER): Payer: PPO | Admitting: Family Medicine

## 2022-12-26 ENCOUNTER — Encounter: Payer: Self-pay | Admitting: Family Medicine

## 2022-12-26 ENCOUNTER — Ambulatory Visit: Payer: PPO | Admitting: Family Medicine

## 2022-12-26 VITALS — BP 120/64 | HR 76 | Temp 97.9°F | Ht 58.5 in | Wt 132.4 lb

## 2022-12-26 DIAGNOSIS — I10 Essential (primary) hypertension: Secondary | ICD-10-CM

## 2022-12-26 DIAGNOSIS — G309 Alzheimer's disease, unspecified: Secondary | ICD-10-CM

## 2022-12-26 DIAGNOSIS — Z0000 Encounter for general adult medical examination without abnormal findings: Secondary | ICD-10-CM | POA: Diagnosis not present

## 2022-12-26 DIAGNOSIS — F0280 Dementia in other diseases classified elsewhere without behavioral disturbance: Secondary | ICD-10-CM | POA: Diagnosis not present

## 2022-12-26 DIAGNOSIS — F320 Major depressive disorder, single episode, mild: Secondary | ICD-10-CM | POA: Diagnosis not present

## 2022-12-26 NOTE — Patient Instructions (Addendum)
Start daily walking 5-10 min a day.  Start Ensure or boost in additional to meals.

## 2022-12-26 NOTE — Progress Notes (Signed)
Patient ID: Jessica Hardin, female    DOB: [birth year], 82 y.o.   MRN: [id number]  This visit was conducted in person.  BP 120/64 (BP Location: Left Arm, Patient Position: Sitting, Cuff Size: Normal)   Pulse 76   Temp 97.9 F (36.6 C) (Temporal)   Ht 4' 10.5" (1.486 m)   Wt 132 lb 6 oz (60 kg)   SpO2 96%   BMI 27.20 kg/m    CC:  Chief Complaint  Patient presents with  . Annual Exam    Part 2 ([facility] 01/03/2022)    Subjective:   HPI: The patient presents for annual medicare wellness, complete physical and review of chronic health problems. He/She also has the following acute concerns today:  The patient saw a LPN or RN for medicare wellness visit.01/03/2022  Prevention and wellness was reviewed in detail. Note reviewed and important notes copied below.  Screening hearing and vision: done     12/26/2022    3:29 PM 01/03/2022    3:10 PM 12/31/2020    3:25 PM 12/16/2019   11:21 AM 12/03/2018   11:25 AM  Fall Risk   Falls in the past year? 0 0 0 0 0  Number falls in past yr: 0 0 0  0  Injury with Fall? 0 0 0  0  Risk for fall due to : No Fall Risks No Fall Risks     Follow up Falls evaluation completed       Flowsheet Row Office Visit from 03/07/2022 in [facility]  PHQ-2 Total Score 2      MDD, GAD, chronic insomnia: Followed by psychiatry. Reviewed Dr. Shelda Altes last OV.   Alzheimer's disease followed by neurology, Dr. Malvin Johns: On donezepil    Reviewed MrI brain from 08/11/2020  IMPRESSION: 1. No acute intracranial abnormality. 2. Slight progression of mild cerebral atrophy. 3. Minimal chronic small vessel ischemic disease.  Hypertension:    Good control on amlodipine BP Readings from Last 3 Encounters:  12/26/22 120/64  11/29/22 126/62  06/28/22 (!) 165/92  Using medication without problems or lightheadedness: none Chest pain with exertion: none Edema:none Short of breath: none Average home BPs: Other issues:  Diet  healthy  Body mass index is 27.2 kg/m.  Wt Readings from Last 3 Encounters:  12/26/22 132 lb 6 oz (60 kg)  11/29/22 135 lb 2 oz (61.3 kg)  06/28/22 120 lb (54.4 kg)    Exercise: walking off and on.  Reviewed labs in detail with pt  Lab Results  Component Value Date   CHOL 190 12/19/2022   HDL 66.90 12/19/2022   LDLCALC 110 (H) 12/19/2022   TRIG 65.0 12/19/2022   CHOLHDL 3 12/19/2022     Poor appetite, drinking some tea. Wt Readings from Last 3 Encounters:  12/26/22 132 lb 6 oz (60 kg)  11/29/22 135 lb 2 oz (61.3 kg)  06/28/22 120 lb (54.4 kg)         Relevant past medical, surgical, family and social history reviewed and updated as indicated. Interim medical history since our last visit reviewed. Allergies and medications reviewed and updated. Outpatient Medications Prior to Visit  Medication Sig Dispense Refill  . amLODipine (NORVASC) 2.5 MG tablet Take 2 tablets by mouth once daily 180 tablet 1  . latanoprost (XALATAN) 0.005 % ophthalmic solution Place 1 drop into both eyes at bedtime.    . mirtazapine (REMERON) 15 MG tablet Take 7.5 mg by mouth at bedtime.    Marland Kitchen  Multiple Vitamin (MULTIVITAMIN) capsule Take 1 capsule by mouth daily.    Marland Kitchen venlafaxine XR (EFFEXOR-XR) 37.5 MG 24 hr capsule TAKE 1 CAPSULE BY MOUTH ONCE DAILY WITH BREAKFAST 30 capsule 5  . Vitamin D, Ergocalciferol, (DRISDOL) 1.25 MG (50000 UNIT) CAPS capsule Take 1 capsule (50,000 Units total) by mouth every 7 (seven) days. 12 capsule 00   No facility-administered medications prior to visit.     Per HPI unless specifically indicated in ROS section below Review of Systems  Constitutional:  Negative for fatigue and fever.  HENT:  Negative for congestion.   Eyes:  Negative for pain.  Respiratory:  Negative for cough and shortness of breath.   Cardiovascular:  Negative for chest pain, palpitations and leg swelling.  Gastrointestinal:  Negative for abdominal pain.  Genitourinary:  Negative for dysuria and  vaginal bleeding.  Musculoskeletal:  Negative for back pain.  Neurological:  Negative for syncope, light-headedness and headaches.  Psychiatric/Behavioral:  Negative for dysphoric mood.    Objective:  BP 120/64 (BP Location: Left Arm, Patient Position: Sitting, Cuff Size: Normal)   Pulse 76   Temp 97.9 F (36.6 C) (Temporal)   Ht 4' 10.5" (1.486 m)   Wt 132 lb 6 oz (60 kg)   SpO2 96%   BMI 27.20 kg/m   Wt Readings from Last 3 Encounters:  12/26/22 132 lb 6 oz (60 kg)  11/29/22 135 lb 2 oz (61.3 kg)  06/28/22 120 lb (54.4 kg)      Physical Exam Constitutional:      General: She is not in acute distress.    Appearance: Normal appearance. She is well-developed. She is not ill-appearing or toxic-appearing.  HENT:     Head: Normocephalic.     Right Ear: Hearing, tympanic membrane, ear canal and external ear normal. Tympanic membrane is not erythematous, retracted or bulging.     Left Ear: Hearing, tympanic membrane, ear canal and external ear normal. Tympanic membrane is not erythematous, retracted or bulging.     Nose: No mucosal edema or rhinorrhea.     Right Sinus: No maxillary sinus tenderness or frontal sinus tenderness.     Left Sinus: No maxillary sinus tenderness or frontal sinus tenderness.     Mouth/Throat:     Pharynx: Uvula midline.  Eyes:     General: Lids are normal. Lids are everted, no foreign bodies appreciated.     Conjunctiva/sclera: Conjunctivae normal.     Pupils: Pupils are equal, round, and reactive to light.  Neck:     Thyroid: No thyroid mass or thyromegaly.     Vascular: No carotid bruit.     Trachea: Trachea normal.  Cardiovascular:     Rate and Rhythm: Normal rate and regular rhythm.     Pulses: Normal pulses.     Heart sounds: Normal heart sounds, S1 normal and S2 normal. No murmur heard.    No friction rub. No gallop.  Pulmonary:     Effort: Pulmonary effort is normal. No tachypnea or respiratory distress.     Breath sounds: Normal breath  sounds. No decreased breath sounds, wheezing, rhonchi or rales.  Abdominal:     General: Bowel sounds are normal.     Palpations: Abdomen is soft.     Tenderness: There is no abdominal tenderness.  Musculoskeletal:     Cervical back: Normal range of motion and neck supple.  Skin:    General: Skin is warm and dry.     Findings: No rash.  Neurological:  Mental Status: She is alert.  Psychiatric:        Mood and Affect: Mood is not anxious or depressed.        Speech: Speech normal.        Behavior: Behavior normal. Behavior is cooperative.        Thought Content: Thought content normal.        Judgment: Judgment normal.      Results for orders placed or performed in visit on 12/19/22  VITAMIN D 25 Hydroxy (Vit-D Deficiency, Fractures)  Result Value Ref Range   VITD 45.83 30.00 - 100.00 ng/mL  Lipid panel  Result Value Ref Range   Cholesterol 190 0 - 200 mg/dL   Triglycerides 74.2 0.0 - 149.0 mg/dL   HDL 59.56 >38.75 mg/dL   VLDL 64.3 0.0 - 32.9 mg/dL   LDL Cholesterol 518 (H) 0 - 99 mg/dL   Total CHOL/HDL Ratio 3    NonHDL 123.46   Comprehensive metabolic panel  Result Value Ref Range   Sodium 139 135 - 145 mEq/L   Potassium 3.3 (L) 3.5 - 5.1 mEq/L   Chloride 102 96 - 112 mEq/L   CO2 28 19 - 32 mEq/L   Glucose, Bld 110 (H) 70 - 99 mg/dL   BUN 10 6 - 23 mg/dL   Creatinine, Ser 8.41 0.40 - 1.20 mg/dL   Total Bilirubin 0.5 0.2 - 1.2 mg/dL   Alkaline Phosphatase 76 39 - 117 U/L   AST 19 0 - 37 U/L   ALT 11 0 - 35 U/L   Total Protein 7.9 6.0 - 8.3 g/dL   Albumin 4.0 3.5 - 5.2 g/dL   GFR 66.06 (L) >30.16 mL/min   Calcium 9.8 8.4 - 10.5 mg/dL    This visit occurred during the SARS-CoV-2 public health emergency.  Safety protocols were in place, including screening questions prior to the visit, additional usage of staff PPE, and extensive cleaning of exam room while observing appropriate contact time as indicated for disinfecting solutions.   COVID 19 screen:  No recent  travel or known exposure to COVID19 The patient denies respiratory symptoms of COVID 19 at this time. The importance of social distancing was discussed today.   Assessment and Plan   The patient's preventative maintenance and recommended screening tests for an annual wellness exam were reviewed in full today. Brought up to date unless services declined.  Counselled on the importance of diet, exercise, and its role in overall health and mortality. The patient's FH and SH was reviewed, including their home life, tobacco status, and drug and alcohol status.    COVID x 3 Consider shingrix, uptodate with PNA vaccine Last mammo 06/2022  Sister with breast cancer, Colonoscopy: colonoscopy nml 2008  Dr. Markham Jordan. Repeat was due in 5 years.  Since then saw Dr. Fernande Bras, [facility]. 2009 sigmoid nml! Brother with colon cancer.  No further indicated  PAP/DVE: TAH, not indicated DXA: 11/2015 osteopenia, repeat in 5 years.. due   Problem List Items Addressed This Visit   None     Kerby Nora, MD

## 2022-12-27 NOTE — Assessment & Plan Note (Signed)
Stable, chronic.  Continue current medication.   Amlodipine 2.5 mg daily 

## 2022-12-27 NOTE — Assessment & Plan Note (Signed)
Chronic stable per husband.  Not currently in air interested in medication to treat Evaluated by Dr. Malvin Johns neurology in the past

## 2022-12-27 NOTE — Assessment & Plan Note (Signed)
Chronic, stable control on venlafaxine. 

## 2023-01-08 ENCOUNTER — Other Ambulatory Visit: Payer: Self-pay | Admitting: Family Medicine

## 2023-01-09 ENCOUNTER — Ambulatory Visit (INDEPENDENT_AMBULATORY_CARE_PROVIDER_SITE_OTHER): Payer: PPO

## 2023-01-09 VITALS — Wt 132.0 lb

## 2023-01-09 DIAGNOSIS — Z0000 Encounter for general adult medical examination without abnormal findings: Secondary | ICD-10-CM

## 2023-01-09 NOTE — Progress Notes (Signed)
Subjective:   SHINIKA ESTELLE is a 82 y.o. female who presents for Medicare Annual (Subsequent) preventive examination.  Visit Complete: Virtual  I connected with  Olin Pia on 01/09/23 by a audio enabled telemedicine application and verified that I am speaking with the correct person using two identifiers.  Patient Location: Home  Provider Location: Office/Clinic  I discussed the limitations of evaluation and management by telemedicine. The patient expressed understanding and agreed to proceed.   Review of Systems     Cardiac Risk Factors include: advanced age (>82men, >77 women);hypertension;dyslipidemia     Objective:    Today's Vitals   01/09/23 1417  Weight: 132 lb (59.9 kg)   Body mass index is 27.12 kg/m.     01/09/2023    2:24 PM 06/28/2022    2:59 PM 02/28/2022    4:19 PM 01/03/2022    3:11 PM 08/11/2021    3:26 PM 01/18/2021    7:41 PM 04/20/2020   11:11 AM  Advanced Directives  Does Patient Have a Medical Advance Directive? No No No No Yes No No  Type of Agricultural consultant;Living will    Does patient want to make changes to medical advance directive?    No - Patient declined     Would patient like information on creating a medical advance directive? No - Patient declined No - Patient declined No - Patient declined No - Patient declined       Current Medications (verified) Outpatient Encounter Medications as of 01/09/2023  Medication Sig   amLODipine (NORVASC) 2.5 MG tablet Take 2 tablets by mouth once daily   latanoprost (XALATAN) 0.005 % ophthalmic solution Place 1 drop into both eyes at bedtime.   mirtazapine (REMERON) 15 MG tablet Take 7.5 mg by mouth at bedtime.   Multiple Vitamin (MULTIVITAMIN) capsule Take 1 capsule by mouth daily.   venlafaxine XR (EFFEXOR-XR) 37.5 MG 24 hr capsule TAKE 1 CAPSULE BY MOUTH ONCE DAILY WITH BREAKFAST   Vitamin D, Ergocalciferol, (DRISDOL) 1.25 MG (50000 UNIT) CAPS capsule Take 1  capsule (50,000 Units total) by mouth every 7 (seven) days.   No facility-administered encounter medications on file as of 01/09/2023.    Allergies (verified) Shellfish allergy   History: Past Medical History:  Diagnosis Date   Allergy    Anxiety    Asthma    GERD (gastroesophageal reflux disease)    History of chicken pox    IBS (irritable bowel syndrome)    Migraine    Urinary tract infection    Past Surgical History:  Procedure Laterality Date   ABDOMINAL HYSTERECTOMY     CATARACT EXTRACTION, BILATERAL     TUBAL LIGATION     Family History  Problem Relation Age of Onset   Arthritis Mother    Hypertension Mother    Arthritis Sister    Hypertension Sister    Colon cancer Brother 30   Breast cancer Sister    Breast cancer Sister    Uterine cancer Sister 11   Multiple sclerosis Daughter    Social History   Socioeconomic History   Marital status: Married    Spouse name: Not on file   Number of children: 3   Years of education: Not on file   Highest education level: Associate degree: occupational, Scientist, product/process development, or vocational program  Occupational History   Not on file  Tobacco Use   Smoking status: Never   Smokeless tobacco: Never  Vaping Use  Vaping status: Never Used  Substance and Sexual Activity   Alcohol use: No    Alcohol/week: 0.0 standard drinks of alcohol   Drug use: No   Sexual activity: Yes    Birth control/protection: Surgical  Other Topics Concern   Not on file  Social History Narrative   Lives at home with her husband   Right handed   Caffeine: none   Social Determinants of Health   Financial Resource Strain: Low Risk  (01/09/2023)   Overall Financial Resource Strain (CARDIA)    Difficulty of Paying Living Expenses: Not hard at all  Food Insecurity: No Food Insecurity (01/09/2023)   Hunger Vital Sign    Worried About Running Out of Food in the Last Year: Never true    Ran Out of Food in the Last Year: Never true  Transportation Needs:  No Transportation Needs (01/09/2023)   PRAPARE - Administrator, Civil Service (Medical): No    Lack of Transportation (Non-Medical): No  Physical Activity: Insufficiently Active (01/09/2023)   Exercise Vital Sign    Days of Exercise per Week: 2 days    Minutes of Exercise per Session: 20 min  Stress: No Stress Concern Present (01/09/2023)   [employer] of Occupational Health - Occupational Stress Questionnaire    Feeling of Stress : Not at all  Social Connections: Moderately Integrated (01/09/2023)   Social Connection and Isolation Panel [NHANES]    Frequency of Communication with Friends and Family: More than three times a week    Frequency of Social Gatherings with Friends and Family: Twice a week    Attends Religious Services: More than 4 times per year    Active Member of [employer] or Organizations: Yes    Attends Banker Meetings: Never    Marital Status: Widowed    Tobacco Counseling Counseling given: Not Answered   Clinical Intake:  Pre-visit preparation completed: Yes  Pain : No/denies pain     BMI - recorded: 27.12 Nutritional Status: BMI 25 -29 Overweight Nutritional Risks: None Diabetes: No  How often do you need to have someone help you when you read instructions, pamphlets, or other written materials from your doctor or pharmacy?: 1 - Never  Interpreter Needed?: No  Information entered by :: Lanier Ensign, LPN   Activities of Daily Living    01/09/2023    2:18 PM  In your present state of health, do you have any difficulty performing the following activities:  Hearing? 0  Vision? 0  Difficulty concentrating or making decisions? 0  Walking or climbing stairs? 0  Dressing or bathing? 0  Doing errands, shopping? 0  Preparing Food and eating ? N  Using the Toilet? N  In the past six months, have you accidently leaked urine? N  Do you have problems with loss of bowel control? N  Managing your Medications? N  Managing your  Finances? N  Housekeeping or managing your Housekeeping? N    Patient Care Team: Excell Seltzer, MD as PCP - General (Family Medicine) Defrancesco, Prentice Docker, MD as Referring Physician (Obstetrics and Gynecology) Lemar Livings Merrily Pew, MD (General Surgery) Sallee Lange, MD as Referring Physician (Ophthalmology) Kathyrn Sheriff, [facility] (Inactive) as Pharmacist (Pharmacist)  Indicate any recent Medical Services you may have received from other than [facility] providers in the past year (date may be approximate).     Assessment:   This is a routine wellness examination for Tolar.  Hearing/Vision screen Hearing Screening - Comments:: Denies any  hearing issues  Vision Screening - Comments:: Pt follows up with Dr Adele Schilder for annual eye exams   Dietary issues and exercise activities discussed:     Goals Addressed             This Visit's Progress    Patient Stated       Maintain good health       Depression Screen    01/09/2023    2:21 PM 12/26/2022    3:31 PM 11/29/2022    3:27 PM 03/07/2022   11:42 AM 01/03/2022    3:06 PM 09/09/2021    9:51 AM 12/31/2020    3:25 PM  PHQ 2/9 Scores  PHQ - 2 Score 1   2 0 2 0  PHQ- 9 Score    8  5 2   Exception Documentation  Medical reason Medical reason        Fall Risk    01/09/2023    2:25 PM 12/26/2022    3:29 PM 01/03/2022    3:10 PM 12/31/2020    3:25 PM 12/16/2019   11:21 AM  Fall Risk   Falls in the past year? 0 0 0 0 0  Number falls in past yr: 0 0 0 0   Injury with Fall? 0 0 0 0   Risk for fall due to : Impaired vision;Impaired balance/gait No Fall Risks No Fall Risks    Follow up Falls prevention discussed Falls evaluation completed       MEDICARE RISK AT HOME:  Medicare Risk at Home - 01/09/23 1425     Any stairs in or around the home? Yes    If so, are there any without handrails? No    Home free of loose throw rugs in walkways, pet beds, electrical cords, etc? Yes    Adequate lighting in your home to reduce risk of  falls? Yes    Life alert? No    Use of a cane, walker or w/c? No    Grab bars in the bathroom? No    Shower chair or bench in shower? No    Elevated toilet seat or a handicapped toilet? No             TIMED UP AND GO:  Was the test performed?  No    Cognitive Function:DECLINED COGNITION TESTING     06/13/2017   11:05 AM 11/04/2015    1:40 PM  MMSE - Mini Mental State Exam  Orientation to time 5 5  Orientation to Place 5 5  Registration 3 3  Attention/ Calculation 5 0  Recall 2 3  Language- name 2 objects 2 0  Language- repeat 1 1  Language- follow 3 step command 3 3  Language- read & follow direction 1 0  Write a sentence 1 0  Copy design 1 0  Total score 29 20        01/03/2022    3:11 PM  6CIT Screen  What Year? 4 points  What month? 3 points  What time? 0 points  Count back from 20 0 points  Months in reverse 0 points  Repeat phrase 4 points  Total Score 11 points    Immunizations Immunization History  Administered Date(s) Administered   PFIZER(Purple Top)SARS-COV-2 Vaccination 09/06/2019, 09/30/2019, 06/05/2020   PNEUMOCOCCAL CONJUGATE-20 12/31/2020      Flu Vaccine status: Up to date  Pneumococcal vaccine status: Up to date  Covid-19 vaccine status: Completed vaccines  Qualifies for Shingles Vaccine? Yes  Zostavax completed No   Shingrix Completed?: No.    Education has been provided regarding the importance of this vaccine. Patient has been advised to call insurance company to determine out of pocket expense if they have not yet received this vaccine. Advised may also receive vaccine at local pharmacy or Health Dept. Verbalized acceptance and understanding.  Screening Tests Health Maintenance  Topic Date Due   COVID-19 Vaccine (4 - 2023-24 season) 02/17/2022   Zoster Vaccines- Shingrix (1 of 2) 03/28/2023 (Originally 09/07/1990)   DEXA SCAN  01/09/2024 (Originally 11/17/2020)   INFLUENZA VACCINE  01/18/2023   Medicare Annual Wellness (AWV)   01/09/2024   MAMMOGRAM  06/23/2024   Pneumonia Vaccine 80+ Years old  Completed   HPV VACCINES  Aged Out   DTaP/Tdap/Td  Discontinued    Health Maintenance  Health Maintenance Due  Topic Date Due   COVID-19 Vaccine (4 - 2023-24 season) 02/17/2022    Colorectal cancer screening: No longer required.   Mammogram status: Completed 06/23/22. Repeat every year    Additional Screening:   Vision Screening: Recommended annual ophthalmology exams for early detection of glaucoma and other disorders of the eye. Is the patient up to date with their annual eye exam?  Yes  Who is the provider or what is the name of the office in which the patient attends annual eye exams? Dr Adele Schilder  If pt is not established with a provider, would they like to be referred to a provider to establish care? No .   Dental Screening: Recommended annual dental exams for proper oral hygiene   Community Resource Referral / Chronic Care Management: CRR required this visit?  No   CCM required this visit?  No     Plan:     I have personally reviewed and noted the following in the patient's chart:   Medical and social history Use of alcohol, tobacco or illicit drugs  Current medications and supplements including opioid prescriptions. Patient is not currently taking opioid prescriptions. Functional ability and status Nutritional status Physical activity Advanced directives List of other physicians Hospitalizations, surgeries, and ER visits in previous 12 months Vitals Screenings to include cognitive, depression, and falls Referrals and appointments  In addition, I have reviewed and discussed with patient certain preventive protocols, quality metrics, and best practice recommendations. A written personalized care plan for preventive services as well as general preventive health recommendations were provided to patient.     Marzella Schlein, LPN   [id number]/36/6440   After Visit Summary: (Declined) Due to  this being a telephonic visit, with patients personalized plan was offered to patient but patient Declined AVS at this time   Nurse Notes: pt declined cognition testing at this time pt was very knowledgable to questions asked

## 2023-01-09 NOTE — Patient Instructions (Signed)
Jessica Hardin , Thank you for taking time to come for your Medicare Wellness Visit. I appreciate your ongoing commitment to your health goals. Please review the following plan we discussed and let me know if I can assist you in the future.   These are the goals we discussed:  Goals       Manage My Medicine      Timeframe:  Long-Range Goal Priority:  High Start Date:      07/14/21                       Expected End Date:  07/14/22                     Follow Up Date April 2023   - call for medicine refill 2 or 3 days before it runs out - call if I am sick and can't take my medicine - keep a list of all the medicines I take; vitamins and herbals too -Utilize [facility] for medication synchronization, packaging and delivery    Why is this important?   These steps will help you keep on track with your medicines.   Notes:       No Current goals (pt-stated)      Patient Stated      Maintain good health      sleep management      Starting 11/04/2015, I will continue to do deep breathing exercises whenever I feel stressed and when I unable to sleep at night.         This is a list of the screening recommended for you and due dates:  Health Maintenance  Topic Date Due   DEXA scan (bone density measurement)  11/17/2020   COVID-19 Vaccine (4 - 2023-24 season) 02/17/2022   Zoster (Shingles) Vaccine (1 of 2) 03/28/2023*   Flu Shot  01/18/2023   Medicare Annual Wellness Visit  01/09/2024   Mammogram  06/23/2024   Pneumonia Vaccine  Completed   HPV Vaccine  Aged Out   DTaP/Tdap/Td vaccine  Discontinued  *Topic was postponed. The date shown is not the original due date.    Advanced directives: Advance directive discussed with you today. Even though you declined this today please call our office should you change your mind and we can give you the proper paperwork for you to fill out.  Conditions/risks identified: maintain good health   Next appointment: Follow up in one year for  your annual wellness visit    Preventive Care 65 Years and Older, Female Preventive care refers to lifestyle choices and visits with your health care provider that can promote health and wellness. What does preventive care include? A yearly physical exam. This is also called an annual well check. Dental exams once or twice a year. Routine eye exams. Ask your health care provider how often you should have your eyes checked. Personal lifestyle choices, including: Daily care of your teeth and gums. Regular physical activity. Eating a healthy diet. Avoiding tobacco and drug use. Limiting alcohol use. Practicing safe sex. Taking low-dose aspirin every day. Taking vitamin and mineral supplements as recommended by your health care provider. What happens during an annual well check? The services and screenings done by your health care provider during your annual well check will depend on your age, overall health, lifestyle risk factors, and family history of disease. Counseling  Your health care provider may ask you questions about your: Alcohol use. Tobacco use. Drug  use. Emotional well-being. Home and relationship well-being. Sexual activity. Eating habits. History of falls. Memory and ability to understand (cognition). Work and work Astronomer. Reproductive health. Screening  You may have the following tests or measurements: Height, weight, and BMI. Blood pressure. Lipid and cholesterol levels. These may be checked every 5 years, or more frequently if you are over 38 years old. Skin check. Lung cancer screening. You may have this screening every year starting at age 66 if you have a 30-pack-year history of smoking and currently smoke or have quit within the past 15 years. Fecal occult blood test (FOBT) of the stool. You may have this test every year starting at age 60. Flexible sigmoidoscopy or colonoscopy. You may have a sigmoidoscopy every 5 years or a colonoscopy every 10  years starting at age 51. Hepatitis C blood test. Hepatitis B blood test. Sexually transmitted disease (STD) testing. Diabetes screening. This is done by checking your blood sugar (glucose) after you have not eaten for a while (fasting). You may have this done every 1-3 years. Bone density scan. This is done to screen for osteoporosis. You may have this done starting at age 50. Mammogram. This may be done every 1-2 years. Talk to your health care provider about how often you should have regular mammograms. Talk with your health care provider about your test results, treatment options, and if necessary, the need for more tests. Vaccines  Your health care provider may recommend certain vaccines, such as: Influenza vaccine. This is recommended every year. Tetanus, diphtheria, and acellular pertussis (Tdap, Td) vaccine. You may need a Td booster every 10 years. Zoster vaccine. You may need this after age 51. Pneumococcal 13-valent conjugate (PCV13) vaccine. One dose is recommended after age 24. Pneumococcal polysaccharide (PPSV23) vaccine. One dose is recommended after age 70. Talk to your health care provider about which screenings and vaccines you need and how often you need them. This information is not intended to replace advice given to you by your health care provider. Make sure you discuss any questions you have with your health care provider. Document Released: 07/02/2015 Document Revised: 02/23/2016 Document Reviewed: 04/06/2015 Elsevier Interactive Patient Education  2017 [employer].  Fall Prevention in the Home Falls can cause injuries. They can happen to people of all ages. There are many things you can do to make your home safe and to help prevent falls. What can I do on the outside of my home? Regularly fix the edges of walkways and driveways and fix any cracks. Remove anything that might make you trip as you walk through a door, such as a raised step or threshold. Trim any  bushes or trees on the path to your home. Use bright outdoor lighting. Clear any walking paths of anything that might make someone trip, such as rocks or tools. Regularly check to see if handrails are loose or broken. Make sure that both sides of any steps have handrails. Any raised decks and porches should have guardrails on the edges. Have any leaves, snow, or ice cleared regularly. Use sand or salt on walking paths during winter. Clean up any spills in your garage right away. This includes oil or grease spills. What can I do in the bathroom? Use night lights. Install grab bars by the toilet and in the tub and shower. Do not use towel bars as grab bars. Use non-skid mats or decals in the tub or shower. If you need to sit down in the shower, use a plastic, non-slip  stool. Keep the floor dry. Clean up any water that spills on the floor as soon as it happens. Remove soap buildup in the tub or shower regularly. Attach bath mats securely with double-sided non-slip rug tape. Do not have throw rugs and other things on the floor that can make you trip. What can I do in the bedroom? Use night lights. Make sure that you have a light by your bed that is easy to reach. Do not use any sheets or blankets that are too big for your bed. They should not hang down onto the floor. Have a firm chair that has side arms. You can use this for support while you get dressed. Do not have throw rugs and other things on the floor that can make you trip. What can I do in the kitchen? Clean up any spills right away. Avoid walking on wet floors. Keep items that you use a lot in easy-to-reach places. If you need to reach something above you, use a strong step stool that has a grab bar. Keep electrical cords out of the way. Do not use floor polish or wax that makes floors slippery. If you must use wax, use non-skid floor wax. Do not have throw rugs and other things on the floor that can make you trip. What can I do  with my stairs? Do not leave any items on the stairs. Make sure that there are handrails on both sides of the stairs and use them. Fix handrails that are broken or loose. Make sure that handrails are as long as the stairways. Check any carpeting to make sure that it is firmly attached to the stairs. Fix any carpet that is loose or worn. Avoid having throw rugs at the top or bottom of the stairs. If you do have throw rugs, attach them to the floor with carpet tape. Make sure that you have a light switch at the top of the stairs and the bottom of the stairs. If you do not have them, ask someone to add them for you. What else can I do to help prevent falls? Wear shoes that: Do not have high heels. Have rubber bottoms. Are comfortable and fit you well. Are closed at the toe. Do not wear sandals. If you use a stepladder: Make sure that it is fully opened. Do not climb a closed stepladder. Make sure that both sides of the stepladder are locked into place. Ask someone to hold it for you, if possible. Clearly mark and make sure that you can see: Any grab bars or handrails. First and last steps. Where the edge of each step is. Use tools that help you move around (mobility aids) if they are needed. These include: Canes. Walkers. Scooters. Crutches. Turn on the lights when you go into a dark area. Replace any light bulbs as soon as they burn out. Set up your furniture so you have a clear path. Avoid moving your furniture around. If any of your floors are uneven, fix them. If there are any pets around you, be aware of where they are. Review your medicines with your doctor. Some medicines can make you feel dizzy. This can increase your chance of falling. Ask your doctor what other things that you can do to help prevent falls. This information is not intended to replace advice given to you by your health care provider. Make sure you discuss any questions you have with your health care  provider. Document Released: 04/01/2009 Document Revised: 11/11/2015 Document Reviewed:  07/10/2014 Elsevier Interactive Patient Education  2017 [employer].

## 2023-01-27 ENCOUNTER — Ambulatory Visit
Admission: EM | Admit: 2023-01-27 | Discharge: 2023-01-27 | Disposition: A | Discharge status: Home / Self Care | Payer: PPO | Attending: Emergency Medicine | Admitting: Emergency Medicine

## 2023-01-27 DIAGNOSIS — R5381 Other malaise: Secondary | ICD-10-CM | POA: Diagnosis not present

## 2023-01-27 DIAGNOSIS — R110 Nausea: Secondary | ICD-10-CM | POA: Diagnosis not present

## 2023-01-27 MED ORDER — ONDANSETRON 4 MG PO TBDP: 4.0000 mg | ORAL_TABLET | Freq: Three times a day (TID) | ORAL | 0 refills | Status: DC | PRN

## 2023-01-27 NOTE — ED Provider Notes (Signed)
Jessica Hardin    CSN: [id number] Arrival date & time: 01/27/23  1011      History   Chief Complaint Chief Complaint  Patient presents with   Nausea    HPI Jessica Hardin is a 82 y.o. female.  Accompanied by her husband, patient presents with nausea and generalized malaise x 1 to 2 months.  She denies fever, abdominal pain, vomiting, diarrhea, constipation, dysuria, hematuria, or other symptoms.  Her husband reports she has poor appetite and oral intake.  He has been trying to get her to drink Ensure.  Her medical history includes Alzheimer's, hypertension, IBS, GERD, migraine headache, anxiety, depression, chronic insomnia.  The history is provided by the patient, the spouse and medical records.    Past Medical History:  Diagnosis Date   Allergy    Anxiety    Asthma    GERD (gastroesophageal reflux disease)    History of chicken pox    IBS (irritable bowel syndrome)    Migraine    Urinary tract infection     Patient Active Problem List   Diagnosis Date Noted   Vitamin D deficiency 06/08/2022   Itching 12/16/2021   Bilateral knee pain 03/01/2021   Dizziness 02/17/2021   Hypercholesteremia 12/31/2020   Alzheimer disease (HCC) 09/10/2020   Breast pain, right 04/13/2020   Acute pain of both shoulders 01/23/2020   Essential hypertension 05/14/2019   Arthralgia 01/21/2019   GAD (generalized anxiety disorder) 10/30/2017   MDD (major depressive disorder), single episode, mild (HCC) 10/30/2017   Chronic insomnia 10/30/2017   Allergic rhinitis 10/05/2016   Osteopenia 11/18/2015   Chronic constipation 07/20/2015   Internal hemorrhoid, bleeding 07/20/2015   Counseling regarding end of life decision making 09/08/2014   Gastroesophageal reflux disease without esophagitis 08/13/2014   Irritable bowel syndrome with constipation 08/13/2014   Hx of migraines 08/13/2014   Family history of colon cancer 08/13/2014   Family history of uterine cancer 08/13/2014   Family  history of breast cancer 08/13/2014    Past Surgical History:  Procedure Laterality Date   ABDOMINAL HYSTERECTOMY     CATARACT EXTRACTION, BILATERAL     TUBAL LIGATION      OB History   No obstetric history on file.      Home Medications    Prior to Admission medications   Medication Sig Start Date End Date Taking? Authorizing Provider  ondansetron (ZOFRAN-ODT) 4 MG disintegrating tablet Take 1 tablet (4 mg total) by mouth every 8 (eight) hours as needed for nausea or vomiting. 01/27/23  Yes Mickie Bail, NP  amLODipine (NORVASC) 2.5 MG tablet Take 2 tablets by mouth once daily 01/09/23   Bedsole, Amy E, MD  latanoprost (XALATAN) 0.005 % ophthalmic solution Place 1 drop into both eyes at bedtime.    [provider]  mirtazapine (REMERON) 15 MG tablet Take 7.5 mg by mouth at bedtime.    [provider]  Multiple Vitamin (MULTIVITAMIN) capsule Take 1 capsule by mouth daily.    [provider]  venlafaxine XR (EFFEXOR-XR) 37.5 MG 24 hr capsule TAKE 1 CAPSULE BY MOUTH ONCE DAILY WITH BREAKFAST 08/01/22   Bedsole, Amy E, MD  Vitamin D, Ergocalciferol, (DRISDOL) 1.25 MG (50000 UNIT) CAPS capsule Take 1 capsule (50,000 Units total) by mouth every 7 (seven) days. 05/25/22   Excell Seltzer, MD    Family History Family History  Problem Relation Age of Onset   Arthritis Mother    Hypertension Mother    Arthritis  Sister    Hypertension Sister    Colon cancer Brother 65   Breast cancer Sister    Breast cancer Sister    Uterine cancer Sister 32   Multiple sclerosis Daughter     Social History Social History   Tobacco Use   Smoking status: Never   Smokeless tobacco: Never  Vaping Use   Vaping status: Never Used  Substance Use Topics   Alcohol use: No    Alcohol/week: 0.0 standard drinks of alcohol   Drug use: No     Allergies   Shellfish allergy   Review of Systems Review of Systems  Constitutional:  Positive for fatigue. Negative for chills  and fever.  Respiratory:  Negative for cough and shortness of breath.   Cardiovascular:  Negative for chest pain and palpitations.  Gastrointestinal:  Positive for nausea. Negative for abdominal pain, diarrhea and vomiting.  Genitourinary:  Negative for dysuria, flank pain and hematuria.  All other systems reviewed and are negative.    Physical Exam Triage Vital Signs ED Triage Vitals  Encounter Vitals Group     BP 01/27/23 1116 133/84     Systolic BP Percentile --      Diastolic BP Percentile --      Pulse Rate 01/27/23 1105 77     Resp 01/27/23 1105 18     Temp 01/27/23 1105 99.1 F (37.3 C)     Temp src --      SpO2 01/27/23 1105 96 %     Weight --      Height --      Head Circumference --      Peak Flow --      Pain Score 01/27/23 1109 0     Pain Loc --      Pain Education --      Exclude from Growth Chart --    No data found.  Updated Vital Signs BP 133/84   Pulse 77   Temp 99.1 F (37.3 C)   Resp 18   SpO2 96%   Visual Acuity Right Eye Distance:   Left Eye Distance:   Bilateral Distance:    Right Eye Near:   Left Eye Near:    Bilateral Near:     Physical Exam Vitals and nursing note reviewed.  Constitutional:      General: She is not in acute distress.    Appearance: She is well-developed.  HENT:     Right Ear: Tympanic membrane normal.     Left Ear: Tympanic membrane normal.     Nose: Nose normal.     Mouth/Throat:     Mouth: Mucous membranes are moist.     Pharynx: Oropharynx is clear.  Cardiovascular:     Rate and Rhythm: Normal rate and regular rhythm.     Heart sounds: Normal heart sounds.  Pulmonary:     Effort: Pulmonary effort is normal. No respiratory distress.     Breath sounds: Normal breath sounds.  Abdominal:     General: Bowel sounds are normal.     Palpations: Abdomen is soft.     Tenderness: There is no abdominal tenderness. There is no right CVA tenderness, left CVA tenderness, guarding or rebound.  Musculoskeletal:      Cervical back: Neck supple.  Skin:    General: Skin is warm and dry.  Neurological:     Mental Status: She is alert.      UC Treatments / Results  Labs (all labs ordered are listed,  but only abnormal results are displayed) Labs Reviewed - No data to display  EKG   Radiology No results found.  Procedures Procedures (including critical care time)  Medications Ordered in UC Medications - No data to display  Initial Impression / Assessment and Plan / UC Course  I have reviewed the triage vital signs and the nursing notes.  Pertinent labs & imaging results that were available during my care of the patient were reviewed by me and considered in my medical decision making (see chart for details).   Nausea without vomiting, generalized malaise.  Afebrile and vital signs are stable.  Exam is reassuring.  Treating nausea with Zofran.  Instructed patient and her husband to follow-up with her PCP on Monday.  Education provided on nausea ED precautions given.  They agree to plan of care.  Final Clinical Impressions(s) / UC Diagnoses   Final diagnoses:  Nausea without vomiting  Malaise     Discharge Instructions      Take the Zofran as directed for nausea.  Follow-up with your primary care provider on Monday.     ED Prescriptions     Medication Sig Dispense Auth. Provider   ondansetron (ZOFRAN-ODT) 4 MG disintegrating tablet Take 1 tablet (4 mg total) by mouth every 8 (eight) hours as needed for nausea or vomiting. 20 tablet Mickie Bail, NP      PDMP not reviewed this encounter.   Mickie Bail, NP 01/27/23 1140

## 2023-01-27 NOTE — ED Triage Notes (Addendum)
Patient to Urgent Care with complaints of generally feeling unwell. Unsure what symptoms she is having other than feeling "sick to her stomach". Husband reports she has not had a good appetite since her last pcp appointment and has poor oral intake. Drinking tea, a few ensures and a few bites on meals.

## 2023-01-27 NOTE — Discharge Instructions (Addendum)
Take the Zofran as directed for nausea.  Follow-up with your primary care provider on Monday.

## 2023-01-29 ENCOUNTER — Other Ambulatory Visit: Payer: Self-pay | Admitting: Family Medicine

## 2023-02-06 DIAGNOSIS — Z961 Presence of intraocular lens: Secondary | ICD-10-CM | POA: Diagnosis not present

## 2023-02-06 DIAGNOSIS — H401134 Primary open-angle glaucoma, bilateral, indeterminate stage: Secondary | ICD-10-CM | POA: Diagnosis not present

## 2023-03-01 ENCOUNTER — Encounter: Payer: Self-pay | Admitting: Internal Medicine

## 2023-03-01 ENCOUNTER — Ambulatory Visit (INDEPENDENT_AMBULATORY_CARE_PROVIDER_SITE_OTHER): Payer: PPO | Admitting: Internal Medicine

## 2023-03-01 VITALS — BP 124/74 | HR 91 | Temp 97.5°F | Ht 58.5 in | Wt 134.0 lb

## 2023-03-01 DIAGNOSIS — R270 Ataxia, unspecified: Secondary | ICD-10-CM

## 2023-03-01 DIAGNOSIS — G309 Alzheimer's disease, unspecified: Secondary | ICD-10-CM | POA: Diagnosis not present

## 2023-03-01 DIAGNOSIS — F0280 Dementia in other diseases classified elsewhere without behavioral disturbance: Secondary | ICD-10-CM | POA: Diagnosis not present

## 2023-03-01 LAB — COMPREHENSIVE METABOLIC PANEL
ALT: 12 U/L (ref 0–35)
AST: 18 U/L (ref 0–37)
Albumin: 3.6 g/dL (ref 3.5–5.2)
Alkaline Phosphatase: 80 U/L (ref 39–117)
BUN: 13 mg/dL (ref 6–23)
CO2: 33 meq/L — ABNORMAL HIGH (ref 19–32)
Calcium: 9.6 mg/dL (ref 8.4–10.5)
Chloride: 102 meq/L (ref 96–112)
Creatinine, Ser: 0.95 mg/dL (ref 0.40–1.20)
GFR: 55.42 mL/min — ABNORMAL LOW (ref >60.00)
Glucose, Bld: 83 mg/dL (ref 70–99)
Potassium: 4.1 meq/L (ref 3.5–5.1)
Sodium: 140 meq/L (ref 135–145)
Total Bilirubin: 0.4 mg/dL (ref 0.2–1.2)
Total Protein: 7 g/dL (ref 6.0–8.3)

## 2023-03-01 LAB — CBC
HCT: 40.1 % (ref 36.0–46.0)
Hemoglobin: 12.6 g/dL (ref 12.0–15.0)
MCHC: 31.5 g/dL (ref 30.0–36.0)
MCV: 96.9 fl (ref 78.0–100.0)
Platelets: 217 10*3/uL (ref 150.0–400.0)
RBC: 4.14 Mil/uL (ref 3.87–5.11)
RDW: 13.7 % (ref 11.5–15.5)
WBC: 3.4 10*3/uL — ABNORMAL LOW (ref 4.0–10.5)

## 2023-03-01 LAB — TSH: TSH: 1.13 u[IU]/mL (ref 0.35–5.50)

## 2023-03-01 LAB — VITAMIN B12: Vitamin B-12: 378 pg/mL (ref 211–911)

## 2023-03-01 NOTE — Progress Notes (Signed)
Subjective:    Patient ID: Jessica Hardin, female    DOB: 03-25-1941, 82 y.o.   MRN: [id number]  HPI Here due to problems with memory and balance  Still having problems with memory Forgets about doing things--forgets what she has put in when cooking Husband and daughter drive her Not clear if that has progressed  Husband now here---he doesn't really notice a recent change She doesn't do much cooking or housework now No help with ADLs  Trouble with balance No falls--but will veers off to the side ?slight numbness in feet Will sit down at times--to let tingling go away  Current Outpatient Medications on File Prior to Visit  Medication Sig Dispense Refill   amLODipine (NORVASC) 2.5 MG tablet Take 2 tablets by mouth once daily 180 tablet 1   latanoprost (XALATAN) 0.005 % ophthalmic solution Place 1 drop into both eyes at bedtime.     mirtazapine (REMERON) 15 MG tablet Take 7.5 mg by mouth at bedtime.     Multiple Vitamin (MULTIVITAMIN) capsule Take 1 capsule by mouth daily.     ondansetron (ZOFRAN-ODT) 4 MG disintegrating tablet Take 1 tablet (4 mg total) by mouth every 8 (eight) hours as needed for nausea or vomiting. 20 tablet 0   venlafaxine XR (EFFEXOR-XR) 37.5 MG 24 hr capsule TAKE 1 CAPSULE BY MOUTH ONCE DAILY WITH BREAKFAST 30 capsule 5   Vitamin D, Ergocalciferol, (DRISDOL) 1.25 MG (50000 UNIT) CAPS capsule Take 1 capsule (50,000 Units total) by mouth every 7 (seven) days. 12 capsule 00   No current facility-administered medications on file prior to visit.    Allergies  Allergen Reactions   Shellfish Allergy Hives and Other (See Comments)    CHEST PAIN AND ITCHINESS (INTERNAL--MOUTH THRU GI TRACT)  WITH NAUSEA    Past Medical History:  Diagnosis Date   Allergy    Anxiety    Asthma    GERD (gastroesophageal reflux disease)    History of chicken pox    IBS (irritable bowel syndrome)    Migraine    Urinary tract infection     Past Surgical History:  Procedure  Laterality Date   ABDOMINAL HYSTERECTOMY     CATARACT EXTRACTION, BILATERAL     TUBAL LIGATION      Family History  Problem Relation Age of Onset   Arthritis Mother    Hypertension Mother    Arthritis Sister    Hypertension Sister    Colon cancer Brother 37   Breast cancer Sister    Breast cancer Sister    Uterine cancer Sister 35   Multiple sclerosis Daughter     Social History   Socioeconomic History   Marital status: Married    Spouse name: Not on file   Number of children: 3   Years of education: Not on file   Highest education level: Associate degree: occupational, Scientist, product/process development, or vocational program  Occupational History   Not on file  Tobacco Use   Smoking status: Never   Smokeless tobacco: Never  Vaping Use   Vaping status: Never Used  Substance and Sexual Activity   Alcohol use: No    Alcohol/week: 0.0 standard drinks of alcohol   Drug use: No   Sexual activity: Yes    Birth control/protection: Surgical  Other Topics Concern   Not on file  Social History Narrative   Lives at home with her husband   Right handed   Caffeine: none   Social Determinants of Corporate investment banker  Strain: Low Risk  (01/09/2023)   Overall Financial Resource Strain (CARDIA)    Difficulty of Paying Living Expenses: Not hard at all  Food Insecurity: No Food Insecurity (01/09/2023)   Hunger Vital Sign    Worried About Running Out of Food in the Last Year: Never true    Ran Out of Food in the Last Year: Never true  Transportation Needs: No Transportation Needs (01/09/2023)   PRAPARE - Administrator, Civil Service (Medical): No    Lack of Transportation (Non-Medical): No  Physical Activity: Insufficiently Active (01/09/2023)   Exercise Vital Sign    Days of Exercise per Week: 2 days    Minutes of Exercise per Session: 20 min  Stress: No Stress Concern Present (01/09/2023)   [employer] of Occupational Health - Occupational Stress Questionnaire    Feeling of  Stress : Not at all  Social Connections: Moderately Integrated (01/09/2023)   Social Connection and Isolation Panel [NHANES]    Frequency of Communication with Friends and Family: More than three times a week    Frequency of Social Gatherings with Friends and Family: Twice a week    Attends Religious Services: More than 4 times per year    Active Member of [employer] or Organizations: Yes    Attends Banker Meetings: Never    Marital Status: Widowed  Intimate Partner Violence: Not At Risk (01/09/2023)   Humiliation, Afraid, Rape, and Kick questionnaire    Fear of Current or Ex-Partner: No    Emotionally Abused: No    Physically Abused: No    Sexually Abused: No   Review of Systems Variable with eating Weight fairly stable     Objective:   Physical Exam Neurological:     Comments: Mildly ataxic gait ROmberg absent            Assessment & Plan:

## 2023-03-01 NOTE — Addendum Note (Signed)
Addended by: Tillman Abide I on: 03/01/2023 12:22 PM   Modules accepted: Orders

## 2023-03-01 NOTE — Assessment & Plan Note (Addendum)
Husband is vague about this ("she just doesn't exercise") Will check labs--especially B12 Set back up with neurology--but husband doesn't want to proceed unless Dr Ermalene Searing thinks this is needed

## 2023-03-01 NOTE — Assessment & Plan Note (Signed)
Seems to have fairly stable memory loss and functional status per husband Should get back to neurologist for reevaluation

## 2023-03-07 ENCOUNTER — Telehealth: Payer: Self-pay | Admitting: Family Medicine

## 2023-03-07 NOTE — Telephone Encounter (Signed)
See result note from 03/01/2023 labs.

## 2023-03-07 NOTE — Telephone Encounter (Signed)
Patient husband called in returning a call they received. Thank you!

## 2023-03-21 ENCOUNTER — Ambulatory Visit: Payer: PPO | Admitting: Family Medicine

## 2023-03-21 ENCOUNTER — Encounter: Payer: Self-pay | Admitting: Family Medicine

## 2023-03-21 VITALS — BP 122/60 | HR 74 | Temp 97.8°F | Ht 58.5 in | Wt 131.0 lb

## 2023-03-21 DIAGNOSIS — G309 Alzheimer's disease, unspecified: Secondary | ICD-10-CM | POA: Diagnosis not present

## 2023-03-21 DIAGNOSIS — F320 Major depressive disorder, single episode, mild: Secondary | ICD-10-CM

## 2023-03-21 DIAGNOSIS — F411 Generalized anxiety disorder: Secondary | ICD-10-CM

## 2023-03-21 DIAGNOSIS — F0280 Dementia in other diseases classified elsewhere without behavioral disturbance: Secondary | ICD-10-CM | POA: Diagnosis not present

## 2023-03-21 NOTE — Assessment & Plan Note (Signed)
Chronic with acute worsening.  Appears mood issues are likely contributing to her ability to function with memory loss.  Discussed medication change in detail with the husband but he is hesitant. Encouraged them to try increasing venlafaxine to 75 mg p.o. daily.  Encouraged her to take this with food and possibly split the dose to avoid any upset stomach she may have had in the past.  Follow-up for reevaluation in 4 weeks.

## 2023-03-21 NOTE — Progress Notes (Signed)
Patient ID: Jessica Hardin, female    DOB: 05-Jan-1941, 82 y.o.   MRN: [id number]  This visit was conducted in person.  BP 122/60 (BP Location: Left Arm, Patient Position: Sitting, Cuff Size: Normal)   Pulse 74   Temp 97.8 F (36.6 C) (Temporal)   Ht 4' 10.5" (1.486 m)   Wt 131 lb (59.4 kg)   SpO2 98%   BMI 26.91 kg/m    CC:  Chief Complaint  Patient presents with   Medical Management of Chronic Issues    Subjective:   HPI: Jessica Hardin is a 82 y.o. female presenting on 03/21/2023 for Medical Management of Chronic Issues  Recent office visit acutely with Dr. Alphonsus Hardin. Patient had reported dizziness worsening but husband felt no change noted. Labs evaluated and showed white blood cell count 3.4, platelets and hemoglobin normal, individual blood cell lines within normal range Normal sodium, potassium, kidney and liver function.   Thyroid in normal range B12 normal  Patient does have a history of  Jessica Hardin. Seen in past by Dr. Malvin Hardin.  Not on memory medication to keep med regimen simple per husband request.    She presents today tearful and upset.  She called 911 last night given she was worried someone was in the house.  She does not remember causing the police.   Husband report good and bads.    She occ napping during the day. Sleeping 8-9 hours a night. No trouble falling asleep   She taking venlafaxine 37.5 mg daily and mirtazapine for mood.  Followed in past by psychiatry.  Wt Readings from Last 3 Encounters:  03/21/23 131 lb (59.4 kg)  03/01/23 134 lb (60.8 kg)  01/09/23 132 lb (59.9 kg)    Relevant past medical, surgical, family and social history reviewed and updated as indicated. Interim medical history since our last visit reviewed. Allergies and medications reviewed and updated. Outpatient Medications Prior to Visit  Medication Sig Dispense Refill   amLODipine (NORVASC) 2.5 MG tablet Take 2 tablets by mouth once daily 180 tablet 1    latanoprost (XALATAN) 0.005 % ophthalmic solution Place 1 drop into both eyes at bedtime.     mirtazapine (REMERON) 15 MG tablet Take 7.5 mg by mouth at bedtime.     Multiple Vitamin (MULTIVITAMIN) capsule Take 1 capsule by mouth daily.     ondansetron (ZOFRAN-ODT) 4 MG disintegrating tablet Take 1 tablet (4 mg total) by mouth every 8 (eight) hours as needed for nausea or vomiting. 20 tablet 0   venlafaxine XR (EFFEXOR-XR) 37.5 MG 24 hr capsule TAKE 1 CAPSULE BY MOUTH ONCE DAILY WITH BREAKFAST 30 capsule 5   Vitamin D, Ergocalciferol, (DRISDOL) 1.25 MG (50000 UNIT) CAPS capsule Take 1 capsule (50,000 Units total) by mouth every 7 (seven) days. 12 capsule 00   No facility-administered medications prior to visit.     Per HPI unless specifically indicated in ROS section below Review of Systems  Constitutional:  Negative for fatigue and fever.  HENT:  Negative for congestion.   Eyes:  Negative for pain.  Respiratory:  Negative for cough and shortness of breath.   Cardiovascular:  Negative for chest pain, palpitations and leg swelling.  Gastrointestinal:  Negative for abdominal pain.  Genitourinary:  Negative for dysuria and vaginal bleeding.  Musculoskeletal:  Negative for back pain.  Neurological:  Negative for syncope, light-headedness and headaches.  Psychiatric/Behavioral:  Negative for dysphoric mood.    Objective:  BP 122/60 (BP Location: Left Arm,  Patient Position: Sitting, Cuff Size: Normal)   Pulse 74   Temp 97.8 F (36.6 C) (Temporal)   Ht 4' 10.5" (1.486 m)   Wt 131 lb (59.4 kg)   SpO2 98%   BMI 26.91 kg/m   Wt Readings from Last 3 Encounters:  03/21/23 131 lb (59.4 kg)  03/01/23 134 lb (60.8 kg)  01/09/23 132 lb (59.9 kg)      Physical Exam Constitutional:      General: She is not in acute distress.    Appearance: Normal appearance. She is well-developed. She is not ill-appearing or toxic-appearing.  HENT:     Head: Normocephalic.     Right Ear: Hearing, tympanic  membrane, ear canal and external ear normal. Tympanic membrane is not erythematous, retracted or bulging.     Left Ear: Hearing, tympanic membrane, ear canal and external ear normal. Tympanic membrane is not erythematous, retracted or bulging.     Nose: No mucosal edema or rhinorrhea.     Right Sinus: No maxillary sinus tenderness or frontal sinus tenderness.     Left Sinus: No maxillary sinus tenderness or frontal sinus tenderness.     Mouth/Throat:     Mouth: Oropharynx is clear and moist and mucous membranes are normal.     Pharynx: Uvula midline.  Eyes:     General: Lids are normal. Lids are everted, no foreign bodies appreciated.     Extraocular Movements: EOM normal.     Conjunctiva/sclera: Conjunctivae normal.     Pupils: Pupils are equal, round, and reactive to light.  Neck:     Thyroid: No thyroid mass or thyromegaly.     Vascular: No carotid bruit.     Trachea: Trachea normal.  Cardiovascular:     Rate and Rhythm: Normal rate and regular rhythm.     Pulses: Normal pulses.     Heart sounds: Normal heart sounds, S1 normal and S2 normal. No murmur heard.    No friction rub. No gallop.  Pulmonary:     Effort: Pulmonary effort is normal. No tachypnea or respiratory distress.     Breath sounds: Normal breath sounds. No decreased breath sounds, wheezing, rhonchi or rales.  Abdominal:     General: Bowel sounds are normal.     Palpations: Abdomen is soft.     Tenderness: There is no abdominal tenderness.  Musculoskeletal:     Cervical back: Normal range of motion and neck supple.  Skin:    General: Skin is warm, dry and intact.     Findings: No rash.  Neurological:     Mental Status: She is alert and oriented to person, place, and time.     GCS: GCS eye subscore is 4. GCS verbal subscore is 5. GCS motor subscore is 6.     Cranial Nerves: No cranial nerve deficit.     Sensory: No sensory deficit.     Motor: No abnormal muscle tone.     Coordination: Coordination normal.      Gait: Gait normal.     Deep Tendon Reflexes: Reflexes are normal and symmetric.     Comments: Nml cerebellar exam   No papilledema  Psychiatric:        Mood and Affect: Mood and affect normal. Mood is not anxious or depressed. Affect is tearful.        Speech: Speech is tangential.        Behavior: Behavior is slowed. Behavior is cooperative.        Thought Content: Thought  content is delusional. Thought content does not include homicidal or suicidal ideation. Thought content does not include homicidal or suicidal plan.        Cognition and Memory: Cognition is impaired. Memory is impaired. She exhibits impaired recent memory and impaired remote memory.        Judgment: Judgment normal.       Results for orders placed or performed in visit on 03/01/23  Vitamin B12  Result Value Ref Range   Vitamin B-12 378 211 - 911 pg/mL  TSH  Result Value Ref Range   TSH 1.13 0.35 - 5.50 uIU/mL  Comprehensive metabolic panel  Result Value Ref Range   Sodium 140 135 - 145 mEq/L   Potassium 4.1 3.5 - 5.1 mEq/L   Chloride 102 96 - 112 mEq/L   CO2 33 (H) 19 - 32 mEq/L   Glucose, Bld 83 70 - 99 mg/dL   BUN 13 6 - 23 mg/dL   Creatinine, Ser 8.29 0.40 - 1.20 mg/dL   Total Bilirubin 0.4 0.2 - 1.2 mg/dL   Alkaline Phosphatase 80 39 - 117 U/L   AST 18 0 - 37 U/L   ALT 12 0 - 35 U/L   Total Protein 7.0 6.0 - 8.3 g/dL   Albumin 3.6 3.5 - 5.2 g/dL   GFR 56.21 (L) >30.86 mL/min   Calcium 9.6 8.4 - 10.5 mg/dL  CBC  Result Value Ref Range   WBC 3.4 (L) 4.0 - 10.5 K/uL   RBC 4.14 3.87 - 5.11 Mil/uL   Platelets 217.0 150.0 - 400.0 K/uL   Hemoglobin 12.6 12.0 - 15.0 g/dL   HCT 57.8 46.9 - 62.9 %   MCV 96.9 78.0 - 100.0 fl   MCHC 31.5 30.0 - 36.0 g/dL   RDW 52.8 41.3 - 24.4 %    Assessment and Plan  MDD (major depressive disorder), single episode, mild (HCC) Assessment & Plan: Chronic with acute worsening.  Appears mood issues are likely contributing to her ability to function with memory loss.   Discussed medication change in detail with the husband but he is hesitant. Encouraged them to try increasing venlafaxine to 75 mg p.o. daily.  Encouraged her to take this with food and possibly split the dose to avoid any upset stomach she may have had in the past.  Follow-up for reevaluation in 4 weeks.   GAD (generalized anxiety disorder)  Alzheimer disease (HCC) Assessment & Plan: Chronic, likely contributing to anxiety and paranoia. Patient's husband does not want to have her return to neurology as he does not feel that there is anything that they will be able to do to help.  She is currently taking Prevagen but we discussed no clear evidence of safety be helpful.  I again encouraged them to consider restarting Aricept or Namenda to slow progression of Jessica.  They will discuss as a family and consider.     Return in about 4 weeks (around 04/18/2023) for follow up mood.   Kerby Nora, MD

## 2023-03-21 NOTE — Assessment & Plan Note (Signed)
Chronic, likely contributing to anxiety and paranoia. Patient's husband does not want to have her return to neurology as he does not feel that there is anything that they will be able to do to help.  She is currently taking Prevagen but we discussed no clear evidence of safety be helpful.  I again encouraged them to consider restarting Aricept or Namenda to slow progression of Alzheimer's.  They will discuss as a family and consider.

## 2023-03-21 NOTE — Patient Instructions (Signed)
Increase dose of venlafaxine to 2  tablets on full stomach.  Continue other medicaitons. Discuss with daughter change of prevagen to Aricept o r Namenda for memory as the prescriptions have been shown to be more effective in slowing memory loss.

## 2023-04-10 ENCOUNTER — Telehealth: Payer: Self-pay | Admitting: Family Medicine

## 2023-04-10 NOTE — Telephone Encounter (Signed)
Pt called in and stated that she missed a phone call from [facility] and would like a return call when ever possible

## 2023-04-26 ENCOUNTER — Encounter: Payer: Self-pay | Admitting: Family Medicine

## 2023-04-26 ENCOUNTER — Ambulatory Visit: Payer: PPO | Admitting: Family Medicine

## 2023-04-26 VITALS — BP 120/70 | HR 71 | Temp 97.6°F | Ht 58.5 in | Wt 134.0 lb

## 2023-04-26 DIAGNOSIS — G309 Alzheimer's disease, unspecified: Secondary | ICD-10-CM | POA: Diagnosis not present

## 2023-04-26 DIAGNOSIS — R42 Dizziness and giddiness: Secondary | ICD-10-CM | POA: Diagnosis not present

## 2023-04-26 DIAGNOSIS — F05 Delirium due to known physiological condition: Secondary | ICD-10-CM

## 2023-04-26 DIAGNOSIS — F0280 Dementia in other diseases classified elsewhere without behavioral disturbance: Secondary | ICD-10-CM

## 2023-04-26 LAB — COMPREHENSIVE METABOLIC PANEL
ALT: 13 U/L (ref 0–35)
AST: 18 U/L (ref 0–37)
Albumin: 3.8 g/dL (ref 3.5–5.2)
Alkaline Phosphatase: 82 U/L (ref 39–117)
BUN: 14 mg/dL (ref 6–23)
CO2: 32 meq/L (ref 19–32)
Calcium: 9.5 mg/dL (ref 8.4–10.5)
Chloride: 103 meq/L (ref 96–112)
Creatinine, Ser: 0.97 mg/dL (ref 0.40–1.20)
GFR: 53.99 mL/min — ABNORMAL LOW (ref >60.00)
Glucose, Bld: 80 mg/dL (ref 70–99)
Potassium: 4.5 meq/L (ref 3.5–5.1)
Sodium: 140 meq/L (ref 135–145)
Total Bilirubin: 0.5 mg/dL (ref 0.2–1.2)
Total Protein: 7.1 g/dL (ref 6.0–8.3)

## 2023-04-26 LAB — TSH: TSH: 0.84 u[IU]/mL (ref 0.35–5.50)

## 2023-04-26 LAB — CBC WITH DIFFERENTIAL/PLATELET
Basophils Absolute: 0 10*3/uL (ref 0.0–0.1)
Basophils Relative: 0.6 % (ref 0.0–3.0)
Eosinophils Absolute: 0 10*3/uL (ref 0.0–0.7)
Eosinophils Relative: 0.7 % (ref 0.0–5.0)
HCT: 39.6 % (ref 36.0–46.0)
Hemoglobin: 12.6 g/dL (ref 12.0–15.0)
Lymphocytes Relative: 35.6 % (ref 12.0–46.0)
Lymphs Abs: 1.4 10*3/uL (ref 0.7–4.0)
MCHC: 31.9 g/dL (ref 30.0–36.0)
MCV: 97.2 fL (ref 78.0–100.0)
Monocytes Absolute: 0.3 10*3/uL (ref 0.1–1.0)
Monocytes Relative: 8.2 % (ref 3.0–12.0)
Neutro Abs: 2.2 10*3/uL (ref 1.4–7.7)
Neutrophils Relative %: 54.9 % (ref 43.0–77.0)
Platelets: 237 10*3/uL (ref 150.0–400.0)
RBC: 4.07 Mil/uL (ref 3.87–5.11)
RDW: 13.9 % (ref 11.5–15.5)
WBC: 4 10*3/uL (ref 4.0–10.5)

## 2023-04-26 LAB — VITAMIN B12: Vitamin B-12: 374 pg/mL (ref 211–911)

## 2023-04-26 MED ORDER — DONEPEZIL HCL 5 MG PO TABS: 5.0000 mg | ORAL_TABLET | Freq: Every day | ORAL | 3 refills | Status: DC

## 2023-04-26 NOTE — Assessment & Plan Note (Addendum)
Eval with labs but most likely due to ETD  issues.  Start flonase 2 sprays per nostril daily.  Encouraged patient not to wear high heels, to wear flats and use cane to avoid falls.

## 2023-04-26 NOTE — Progress Notes (Signed)
Patient ID: Jessica Hardin, female    DOB: 1941/01/30, 82 y.o.   MRN: [id number]  This visit was conducted in person.  BP 120/70 (BP Location: Left Arm, Patient Position: Sitting, Cuff Size: Normal)   Pulse 71   Temp 97.6 F (36.4 C) (Temporal)   Ht 4' 10.5" (1.486 m)   Wt 134 lb (60.8 kg)   SpO2 96%   BMI 27.53 kg/m    CC:  Chief Complaint  Patient presents with   Dizziness   Shaking    Subjective:   HPI: Jessica Hardin is a 82 y.o. female presenting on 04/26/2023 for Dizziness and Shaking  Patient with moderate Alzheimer's dementia, generalized anxiety and major depressive disorder.  She has issues with chronic dizziness and shaking as symptoms of her anxiety and her irregular eating habits.  She has very limited p.o. intake. Today she presents with similar issues but per husband she has been more confused lately.   Husband accompanies her today in office.  Now in last week he notes increase in confusion...  At night when she is upstairs and she is down stairs.Marland KitchenMarland KitchenShe will  come down and ask who he is, does not remember, thinks they need to go to [place].Marland Kitchen spell  for 10-15 min, then she is back to baseline, but does not remeber   She has had some weight gain! More consistently drinking BOOST Wt Readings from Last 3 Encounters:  04/26/23 134 lb (60.8 kg)  03/21/23 131 lb (59.4 kg)  03/01/23 134 lb (60.8 kg)      At last office visit per husband's higher doses of venlafaxine caused side effects. She was instructed to try to take venlafaxine on a full stomach. She is taking very low-dose mirtazapine 7.5 mg daily..   Labs for vit def and CMET, CBC were normal in 02/2023  Relevant past medical, surgical, family and social history reviewed and updated as indicated. Interim medical history since our last visit reviewed. Allergies and medications reviewed and updated. Outpatient Medications Prior to Visit  Medication Sig Dispense Refill   amLODipine (NORVASC) 2.5 MG tablet Take  2 tablets by mouth once daily 180 tablet 1   latanoprost (XALATAN) 0.005 % ophthalmic solution Place 1 drop into both eyes at bedtime.     mirtazapine (REMERON) 15 MG tablet Take 7.5 mg by mouth at bedtime.     Multiple Vitamin (MULTIVITAMIN) capsule Take 1 capsule by mouth daily.     ondansetron (ZOFRAN-ODT) 4 MG disintegrating tablet Take 1 tablet (4 mg total) by mouth every 8 (eight) hours as needed for nausea or vomiting. 20 tablet 0   venlafaxine XR (EFFEXOR-XR) 37.5 MG 24 hr capsule TAKE 1 CAPSULE BY MOUTH ONCE DAILY WITH BREAKFAST 30 capsule 5   Vitamin D, Ergocalciferol, (DRISDOL) 1.25 MG (50000 UNIT) CAPS capsule Take 1 capsule (50,000 Units total) by mouth every 7 (seven) days. 12 capsule 00   No facility-administered medications prior to visit.     Per HPI unless specifically indicated in ROS section below Review of Systems  Constitutional:  Negative for fatigue and fever.  HENT:  Negative for congestion.   Eyes:  Negative for pain.  Respiratory:  Negative for cough and shortness of breath.   Cardiovascular:  Negative for chest pain, palpitations and leg swelling.  Gastrointestinal:  Negative for abdominal pain.  Genitourinary:  Negative for dysuria and vaginal bleeding.  Musculoskeletal:  Negative for back pain.  Neurological:  Negative for syncope, light-headedness and headaches.  Psychiatric/Behavioral:  Negative for dysphoric mood.    Objective:  BP 120/70 (BP Location: Left Arm, Patient Position: Sitting, Cuff Size: Normal)   Pulse 71   Temp 97.6 F (36.4 C) (Temporal)   Ht 4' 10.5" (1.486 m)   Wt 134 lb (60.8 kg)   SpO2 96%   BMI 27.53 kg/m   Wt Readings from Last 3 Encounters:  04/26/23 134 lb (60.8 kg)  03/21/23 131 lb (59.4 kg)  03/01/23 134 lb (60.8 kg)      Physical Exam Constitutional:      General: She is not in acute distress.    Appearance: Normal appearance. She is well-developed. She is not ill-appearing or toxic-appearing.  HENT:     Head:  Normocephalic.     Right Ear: Hearing, tympanic membrane, ear canal and external ear normal. Tympanic membrane is not erythematous, retracted or bulging.     Left Ear: Hearing, tympanic membrane, ear canal and external ear normal. Tympanic membrane is not erythematous, retracted or bulging.     Nose: No mucosal edema or rhinorrhea.     Right Sinus: No maxillary sinus tenderness or frontal sinus tenderness.     Left Sinus: No maxillary sinus tenderness or frontal sinus tenderness.     Mouth/Throat:     Mouth: Oropharynx is clear and moist and mucous membranes are normal.     Pharynx: Uvula midline.  Eyes:     General: Lids are normal. Lids are everted, no foreign bodies appreciated.     Extraocular Movements: EOM normal.     Conjunctiva/sclera: Conjunctivae normal.     Pupils: Pupils are equal, round, and reactive to light.  Neck:     Thyroid: No thyroid mass or thyromegaly.     Vascular: No carotid bruit.     Trachea: Trachea normal.  Cardiovascular:     Rate and Rhythm: Normal rate and regular rhythm.     Pulses: Normal pulses.     Heart sounds: Normal heart sounds, S1 normal and S2 normal. No murmur heard.    No friction rub. No gallop.  Pulmonary:     Effort: Pulmonary effort is normal. No tachypnea or respiratory distress.     Breath sounds: Normal breath sounds. No decreased breath sounds, wheezing, rhonchi or rales.  Abdominal:     General: Bowel sounds are normal.     Palpations: Abdomen is soft.     Tenderness: There is no abdominal tenderness.  Musculoskeletal:     Cervical back: Normal range of motion and neck supple.  Skin:    General: Skin is warm, dry and intact.     Findings: No rash.  Neurological:     Mental Status: She is alert. She is disoriented.     GCS: GCS eye subscore is 4. GCS verbal subscore is 5. GCS motor subscore is 6.     Cranial Nerves: No cranial nerve deficit.     Sensory: No sensory deficit.     Motor: No abnormal muscle tone.      Coordination: Coordination normal.     Gait: Gait normal.     Deep Tendon Reflexes: Reflexes are normal and symmetric.     Comments: Nml cerebellar exam   No papilledema  MMSE 15/30  Psychiatric:        Attention and Perception: She is inattentive.        Mood and Affect: Mood and affect normal. Mood is not anxious or depressed. Affect is flat.  Speech: Speech normal.        Behavior: Behavior normal. Behavior is cooperative.        Cognition and Memory: Memory is impaired. She exhibits impaired recent memory and impaired remote memory.        Judgment: Judgment is inappropriate.       Results for orders placed or performed in visit on 03/01/23  Vitamin B12  Result Value Ref Range   Vitamin B-12 378 211 - 911 pg/mL  TSH  Result Value Ref Range   TSH 1.13 0.35 - 5.50 uIU/mL  Comprehensive metabolic panel  Result Value Ref Range   Sodium 140 135 - 145 mEq/L   Potassium 4.1 3.5 - 5.1 mEq/L   Chloride 102 96 - 112 mEq/L   CO2 33 (H) 19 - 32 mEq/L   Glucose, Bld 83 70 - 99 mg/dL   BUN 13 6 - 23 mg/dL   Creatinine, Ser 9.62 0.40 - 1.20 mg/dL   Total Bilirubin 0.4 0.2 - 1.2 mg/dL   Alkaline Phosphatase 80 39 - 117 U/L   AST 18 0 - 37 U/L   ALT 12 0 - 35 U/L   Total Protein 7.0 6.0 - 8.3 g/dL   Albumin 3.6 3.5 - 5.2 g/dL   GFR 95.28 (L) >41.32 mL/min   Calcium 9.6 8.4 - 10.5 mg/dL  CBC  Result Value Ref Range   WBC 3.4 (L) 4.0 - 10.5 K/uL   RBC 4.14 3.87 - 5.11 Mil/uL   Platelets 217.0 150.0 - 400.0 K/uL   Hemoglobin 12.6 12.0 - 15.0 g/dL   HCT 44.0 10.2 - 72.5 %   MCV 96.9 78.0 - 100.0 fl   MCHC 31.5 30.0 - 36.0 g/dL   RDW 36.6 44.0 - 34.7 %    Assessment and Plan  Alzheimer disease [facility]) Assessment & Plan:  MMSE significant decline in last year MMSE now 15/30   Symptoms at night most likely due to sundowning.   Encouraged pt to start Aricept.. if tolerate will increase to 10 mg daily.  Again  encouraged  to consider follow up  with  Neuro.     Sundowning Assessment & Plan:  Acute.. will eval with labs for possible new change contributing to recent worsening confusion.  She is not agitated  at the time of confusion per husband and states mood is doing fairly well.  Encouarged redirection and orientation when spells occur.   Vertigo Assessment & Plan:  Eval with labs but most likely due to ETD  issues.  Start flonase 2 sprays per nostril daily.  Encouraged patient not to wear high heels, to wear flats and use cane to avoid falls.  Orders: -     Comprehensive metabolic panel -     TSH -     CBC with Differential/Platelet -     Vitamin B12  Other orders -     Donepezil HCl; Take 1 tablet (5 mg total) by mouth at bedtime.  Dispense: 90 tablet; Refill: 3    No follow-ups on file.   Kerby Nora, MD

## 2023-04-26 NOTE — Assessment & Plan Note (Signed)
Acute.. will eval with labs for possible new change contributing to recent worsening confusion.  She is not agitated  at the time of confusion per husband and states mood is doing fairly well.  Encouarged redirection and orientation when spells occur.

## 2023-04-26 NOTE — Patient Instructions (Addendum)
Start donepezil daily for Alzhiemer disease.  Use cane and wear flats.  Please stop at the lab to have labs drawn.  Can try flonase 2 spray per nostril daily.

## 2023-04-26 NOTE — Assessment & Plan Note (Addendum)
MMSE significant decline in last year MMSE now 15/30   Symptoms at night most likely due to sundowning.   Encouraged pt to start Aricept.. if tolerate will increase to 10 mg daily.  Again  encouraged  to consider follow up  with Neuro.

## 2023-06-28 ENCOUNTER — Encounter: Payer: Self-pay | Admitting: Family Medicine

## 2023-06-28 ENCOUNTER — Ambulatory Visit (INDEPENDENT_AMBULATORY_CARE_PROVIDER_SITE_OTHER): Payer: PPO | Admitting: Family Medicine

## 2023-06-28 VITALS — BP 136/70 | HR 68 | Temp 98.9°F | Ht 58.5 in | Wt 132.0 lb

## 2023-06-28 DIAGNOSIS — G309 Alzheimer's disease, unspecified: Secondary | ICD-10-CM | POA: Diagnosis not present

## 2023-06-28 DIAGNOSIS — F0280 Dementia in other diseases classified elsewhere without behavioral disturbance: Secondary | ICD-10-CM

## 2023-06-28 DIAGNOSIS — I10 Essential (primary) hypertension: Secondary | ICD-10-CM

## 2023-06-28 NOTE — Progress Notes (Signed)
 Patient ID: Jessica Hardin, female    DOB: 01-Jun-1941, 83 y.o.   MRN: [id number]  This visit was conducted in person.  BP 136/70   Pulse 68   Temp 98.9 F (37.2 C) (Oral)   Ht 4' 10.5 (1.486 m)   Wt 132 lb (59.9 kg)   SpO2 98%   BMI 27.12 kg/m    CC:  Chief Complaint  Patient presents with   Medical Management of Chronic Issues    Here for 6 mo HTN f/u.    Subjective:   HPI: Jessica Hardin is a 83 y.o. female presenting on 06/28/2023 for Medical Management of Chronic Issues (Here for 6 mo HTN f/u.)  Hypertension:   Chronic, stable control on low dose amlodipine   BP Readings from Last 3 Encounters:  06/28/23 136/70  04/26/23 120/70  03/21/23 122/60  Using medication without problems or lightheadedness:  none Chest pain with exertion: none Edema:none Short of breath:none Average home BPs: not checking  Other issues:  Patient with moderate Alzheimer's dementia, generalized anxiety and major depressive disorder.  She has issues with chronic dizziness and shaking as symptoms of her anxiety and her irregular eating habits.  She has very limited p.o. intake. She states today she is feeling well overall... denies dizziness.  She is wearing sneakers instead of high heels in office today for the first time.    Husband accompanies her today in office. Symptoms of dementia are stable... no new changes.  She is eating well. No hallucinations lately.  Inconsistently drinking  ENDURE Wt Readings from Last 3 Encounters:  06/28/23 132 lb (59.9 kg)  04/26/23 134 lb (60.8 kg)  03/21/23 131 lb (59.4 kg)      Relevant past medical, surgical, family and social history reviewed and updated as indicated. Interim medical history since our last visit reviewed. Allergies and medications reviewed and updated. Outpatient Medications Prior to Visit  Medication Sig Dispense Refill   amLODipine  (NORVASC ) 2.5 MG tablet Take 2 tablets by mouth once daily 180 tablet 1   donepezil  (ARICEPT )  5 MG tablet Take 1 tablet (5 mg total) by mouth at bedtime. 90 tablet 3   latanoprost (XALATAN) 0.005 % ophthalmic solution Place 1 drop into both eyes at bedtime.     mirtazapine  (REMERON ) 15 MG tablet Take 7.5 mg by mouth at bedtime.     Multiple Vitamin (MULTIVITAMIN) capsule Take 1 capsule by mouth daily.     ondansetron  (ZOFRAN -ODT) 4 MG disintegrating tablet Take 1 tablet (4 mg total) by mouth every 8 (eight) hours as needed for nausea or vomiting. 20 tablet 0   venlafaxine  XR (EFFEXOR -XR) 37.5 MG 24 hr capsule TAKE 1 CAPSULE BY MOUTH ONCE DAILY WITH BREAKFAST 30 capsule 5   Vitamin D , Ergocalciferol , (DRISDOL ) 1.25 MG (50000 UNIT) CAPS capsule Take 1 capsule (50,000 Units total) by mouth every 7 (seven) days. 12 capsule 00   No facility-administered medications prior to visit.     Per HPI unless specifically indicated in ROS section below Review of Systems  Constitutional:  Negative for fatigue and fever.  HENT:  Negative for congestion.   Eyes:  Negative for pain.  Respiratory:  Negative for cough and shortness of breath.   Cardiovascular:  Negative for chest pain, palpitations and leg swelling.  Gastrointestinal:  Negative for abdominal pain.  Genitourinary:  Negative for dysuria and vaginal bleeding.  Musculoskeletal:  Negative for back pain.  Neurological:  Negative for syncope, light-headedness and headaches.  Psychiatric/Behavioral:  Negative for dysphoric mood.    Objective:  BP 136/70   Pulse 68   Temp 98.9 F (37.2 C) (Oral)   Ht 4' 10.5 (1.486 m)   Wt 132 lb (59.9 kg)   SpO2 98%   BMI 27.12 kg/m   Wt Readings from Last 3 Encounters:  06/28/23 132 lb (59.9 kg)  04/26/23 134 lb (60.8 kg)  03/21/23 131 lb (59.4 kg)      Physical Exam Constitutional:      General: She is not in acute distress.    Appearance: Normal appearance. She is well-developed. She is not ill-appearing or toxic-appearing.  HENT:     Head: Normocephalic.     Right Ear: Hearing, tympanic  membrane, ear canal and external ear normal. Tympanic membrane is not erythematous, retracted or bulging.     Left Ear: Hearing, tympanic membrane, ear canal and external ear normal. Tympanic membrane is not erythematous, retracted or bulging.     Nose: No mucosal edema or rhinorrhea.     Right Sinus: No maxillary sinus tenderness or frontal sinus tenderness.     Left Sinus: No maxillary sinus tenderness or frontal sinus tenderness.     Mouth/Throat:     Pharynx: Uvula midline.  Eyes:     General: Lids are normal. Lids are everted, no foreign bodies appreciated.     Conjunctiva/sclera: Conjunctivae normal.     Pupils: Pupils are equal, round, and reactive to light.  Neck:     Thyroid : No thyroid  mass or thyromegaly.     Vascular: No carotid bruit.     Trachea: Trachea normal.  Cardiovascular:     Rate and Rhythm: Normal rate and regular rhythm.     Pulses: Normal pulses.     Heart sounds: Normal heart sounds, S1 normal and S2 normal. No murmur heard.    No friction rub. No gallop.  Pulmonary:     Effort: Pulmonary effort is normal. No tachypnea or respiratory distress.     Breath sounds: Normal breath sounds. No decreased breath sounds, wheezing, rhonchi or rales.  Abdominal:     General: Bowel sounds are normal.     Palpations: Abdomen is soft.     Tenderness: There is no abdominal tenderness.  Musculoskeletal:     Cervical back: Normal range of motion and neck supple.  Skin:    General: Skin is warm and dry.     Findings: No rash.  Neurological:     Mental Status: She is alert. Mental status is at baseline. She is disoriented.     GCS: GCS eye subscore is 4. GCS verbal subscore is 5. GCS motor subscore is 6.     Cranial Nerves: No cranial nerve deficit.     Sensory: No sensory deficit.     Motor: Motor function is intact. No abnormal muscle tone.     Coordination: Coordination normal.     Gait: Gait normal.     Deep Tendon Reflexes: Reflexes are normal and symmetric.      Comments: Nml cerebellar exam     Psychiatric:        Attention and Perception: She is inattentive.        Mood and Affect: Mood is not anxious or depressed. Affect is flat.        Speech: Speech normal.        Behavior: Behavior normal. Behavior is cooperative.        Cognition and Memory: Memory is impaired. She exhibits impaired recent memory  and impaired remote memory.        Judgment: Judgment is inappropriate.       Results for orders placed or performed in visit on 04/26/23  Comprehensive metabolic panel   Collection Time: 04/26/23 10:12 AM  Result Value Ref Range   Sodium 140 135 - 145 mEq/L   Potassium 4.5 3.5 - 5.1 mEq/L   Chloride 103 96 - 112 mEq/L   CO2 32 19 - 32 mEq/L   Glucose, Bld 80 70 - 99 mg/dL   BUN 14 6 - 23 mg/dL   Creatinine, Ser 9.02 0.40 - 1.20 mg/dL   Total Bilirubin 0.5 0.2 - 1.2 mg/dL   Alkaline Phosphatase 82 39 - 117 U/L   AST 18 0 - 37 U/L   ALT 13 0 - 35 U/L   Total Protein 7.1 6.0 - 8.3 g/dL   Albumin 3.8 3.5 - 5.2 g/dL   GFR 46.00 (L) >39.99 mL/min   Calcium  9.5 8.4 - 10.5 mg/dL  TSH   Collection Time: 04/26/23 10:12 AM  Result Value Ref Range   TSH 0.84 0.35 - 5.50 uIU/mL  CBC with Differential/Platelet   Collection Time: 04/26/23 10:12 AM  Result Value Ref Range   WBC 4.0 4.0 - 10.5 K/uL   RBC 4.07 3.87 - 5.11 Mil/uL   Hemoglobin 12.6 12.0 - 15.0 g/dL   HCT 60.3 63.9 - 53.9 %   MCV 97.2 78.0 - 100.0 fl   MCHC 31.9 30.0 - 36.0 g/dL   RDW 86.0 88.4 - 84.4 %   Platelets 237.0 150.0 - 400.0 K/uL   Neutrophils Relative % 54.9 43.0 - 77.0 %   Lymphocytes Relative 35.6 12.0 - 46.0 %   Monocytes Relative 8.2 3.0 - 12.0 %   Eosinophils Relative 0.7 0.0 - 5.0 %   Basophils Relative 0.6 0.0 - 3.0 %   Neutro Abs 2.2 1.4 - 7.7 K/uL   Lymphs Abs 1.4 0.7 - 4.0 K/uL   Monocytes Absolute 0.3 0.1 - 1.0 K/uL   Eosinophils Absolute 0.0 0.0 - 0.7 K/uL   Basophils Absolute 0.0 0.0 - 0.1 K/uL  Vitamin B12   Collection Time: 04/26/23 10:12 AM   Result Value Ref Range   Vitamin B-12 374 211 - 911 pg/mL    Assessment and Plan  Essential hypertension Assessment & Plan: Stable, chronic.  Continue current medication.  Amlodipine  2.5 mg daily   Alzheimer disease (HCC) Assessment & Plan:  Chronic, stable control per pt and husband.  Doing well overall.  Aricept  5 mg po at bedtime.        Return in about 6 months (around 12/26/2023) for phone AMW,  fasting labs then CPE with me.   Greig Ring, MD

## 2023-06-28 NOTE — Assessment & Plan Note (Signed)
Stable, chronic.  Continue current medication.   Amlodipine 2.5 mg daily 

## 2023-06-28 NOTE — Assessment & Plan Note (Signed)
 Chronic, stable control per pt and husband.  Doing well overall.  Aricept 5 mg po at bedtime.

## 2023-07-23 ENCOUNTER — Other Ambulatory Visit: Payer: Self-pay | Admitting: Family Medicine

## 2023-07-23 NOTE — Telephone Encounter (Signed)
Refill and Medication list states patient takes 2 tablets of the Amlodipine  2. 5 mg daily.  Your last office note states 2.5 mg daily.  Please advise.

## 2023-07-26 ENCOUNTER — Telehealth: Payer: Self-pay

## 2023-07-26 NOTE — Telephone Encounter (Signed)
 Copied from CRM [id number]. Topic: Clinical - Prescription Issue >> Jul 26, 2023 12:02 PM Isabell A wrote: Reason for CRM: Spouse calling for clarification on instructions for amLODipine  (NORVASC ) 2.5 MG tablet - patient used to take 2 tables, now it states takes one. >> Jul 26, 2023  3:11 PM Tiffany H wrote: Patient's husband called to follow up on request to speak with an RN about adjusted Amlodipine  prescription.

## 2023-07-26 NOTE — Telephone Encounter (Signed)
 Copied from CRM [id number]. Topic: Clinical - Prescription Issue >> Jul 26, 2023 12:02 PM Isabell A wrote: Reason for CRM: Spouse calling for clarification on instructions for amLODipine  (NORVASC ) 2.5 MG tablet - patient used to take 2 tables, now it states takes one.

## 2023-07-27 ENCOUNTER — Ambulatory Visit: Payer: Self-pay | Admitting: Family Medicine

## 2023-07-27 MED ORDER — AMLODIPINE BESYLATE 5 MG PO TABS: 5.0000 mg | ORAL_TABLET | Freq: Every day | ORAL | 3 refills | Status: AC

## 2023-07-27 NOTE — Telephone Encounter (Signed)
 Left message for Mr. Ruggiero to return to office.  Dr. Avelina that they told her at the last office visit that patient was taking one tablet daily.  I can resend prescription for 2.5 mg to take 2 tablets daily or we can change her to amlodipine  5 mg so she will only have to take one tablet daily.  I ask that they return call to office so we can find out their preference.

## 2023-07-27 NOTE — Telephone Encounter (Signed)
 Spoke with Jessica Hardin and let him know Dr. Avelina misunderstood at last office visit and thought she was only taking amlodipine  2.5 mg daily so that is why the refill was sent in that way.  I advised to go head and double up on the 2.5 mg he just picked up from the pharmacy until they are gone.  He is then agreeable to changing to 5 mg tablet so patient only has to take 1 tablet daily.  Prescription for Amlodipine  5 mg sent to [facility] on [address]

## 2023-07-27 NOTE — Telephone Encounter (Signed)
 Copied from CRM [id number]. Topic: Clinical - Prescription Issue >> Jul 27, 2023 10:40 AM Leila C wrote: Reason for CRM: Patient's spouse Alm called yesterday and has not heard back from the nurse to clarify patient's amLODipine  (NORVASC ) 2.5 MG tablet take 1 tablet daily, but before was taking 2 pills daily. Please advise [phone]. Per CAL, MA is with patient and will call back.

## 2023-07-27 NOTE — Telephone Encounter (Signed)
 Chief Complaint: Change in medication Symptoms: n/a Frequency: n/a Pertinent Negatives: Patient denies n/a Disposition: [] ED /[] Urgent Care (no appt availability in office) / [] Appointment(In office/virtual)/ []  [place] [facility]/ [] Home Care/ [] Refused Recommended Disposition /[]  Mobile Bus/ []  Follow-up with PCP Additional Notes: Pt husband alm is callback again and is wanting a f/u as soon as possible to confirm if there was a change in the prescription for the amlodipine  (NORVASC ) 2.5 MG tablet take 1 tablet daily, but before was taking 2 pills daily. Please advise [phone].    Copied from CRM [id number]. Topic: Clinical - Prescription Issue >> Jul 27, 2023  2:14 PM Joanell B wrote: Reason for CRM: Pt husband alm is callback again and is wanting a f/u as soon as possible to confirm if there was a change in the prescription for the amlodipine  (NORVASC ) 2.5 MG tablet take 1 tablet daily, but before was taking 2 pills daily. Please advise [phone].   Reason for Disposition  [1] Caller has NON-URGENT medicine question about med that PCP prescribed AND [2] triager unable to answer question  Answer Assessment - Initial Assessment Questions 1. NAME of MEDICINE: What medicine(s) are you calling about?     Norvasc  2. QUESTION: What is your question? (e.g., double dose of medicine, side effect)     Patient inquiring if there has been change in medication - patient states they were taking 2 pills and now only taking 1 pill daily.  3. PRESCRIBER: Who prescribed the medicine? Reason: if prescribed by specialist, call should be referred to that group.     Dr. Avelina 4. SYMPTOMS: Do you have any symptoms? If Yes, ask: What symptoms are you having?  How bad are the symptoms (e.g., mild, moderate, severe)     N/a  Protocols used: Medication Question Call-A-AH

## 2023-07-27 NOTE — Addendum Note (Signed)
 Addended by: Wyn Heater on: 07/27/2023 04:57 PM   Modules accepted: Orders

## 2023-07-27 NOTE — Telephone Encounter (Signed)
Noted.  See phone note.  

## 2023-07-27 NOTE — Telephone Encounter (Signed)
 Please resend the prescription for amlodipine  2.5 mg 2 tablets daily.  I misunderstood what patient was taking at home at last office visit.  She just needs to continue which she is currently taking.  You can offer to send in 5 mg tablets size so she can take 1 tablet a day if they would prefer.

## 2023-08-13 ENCOUNTER — Other Ambulatory Visit: Payer: Self-pay | Admitting: Family Medicine

## 2023-08-21 DIAGNOSIS — Z961 Presence of intraocular lens: Secondary | ICD-10-CM | POA: Diagnosis not present

## 2023-08-21 DIAGNOSIS — H40003 Preglaucoma, unspecified, bilateral: Secondary | ICD-10-CM | POA: Diagnosis not present

## 2023-08-21 DIAGNOSIS — H40009 Preglaucoma, unspecified, unspecified eye: Secondary | ICD-10-CM | POA: Diagnosis not present

## 2023-08-21 DIAGNOSIS — H401134 Primary open-angle glaucoma, bilateral, indeterminate stage: Secondary | ICD-10-CM | POA: Diagnosis not present

## 2023-09-20 ENCOUNTER — Emergency Department
Admission: EM | Admit: 2023-09-20 | Discharge: 2023-09-20 | Disposition: A | Discharge status: Home / Self Care | Attending: Emergency Medicine | Admitting: Emergency Medicine

## 2023-09-20 ENCOUNTER — Other Ambulatory Visit: Payer: Self-pay

## 2023-09-20 ENCOUNTER — Emergency Department

## 2023-09-20 ENCOUNTER — Ambulatory Visit: Payer: Self-pay

## 2023-09-20 DIAGNOSIS — I10 Essential (primary) hypertension: Secondary | ICD-10-CM | POA: Insufficient documentation

## 2023-09-20 DIAGNOSIS — R42 Dizziness and giddiness: Secondary | ICD-10-CM | POA: Diagnosis not present

## 2023-09-20 DIAGNOSIS — G309 Alzheimer's disease, unspecified: Secondary | ICD-10-CM | POA: Diagnosis not present

## 2023-09-20 LAB — CBC WITH DIFFERENTIAL/PLATELET
Abs Immature Granulocytes: 0.01 10*3/uL (ref 0.00–0.07)
Basophils Absolute: 0 10*3/uL (ref 0.0–0.1)
Basophils Relative: 0 %
Eosinophils Absolute: 0 10*3/uL (ref 0.0–0.5)
Eosinophils Relative: 1 %
HCT: 40 % (ref 36.0–46.0)
Hemoglobin: 12.8 g/dL (ref 12.0–15.0)
Immature Granulocytes: 0 %
Lymphocytes Relative: 39 %
Lymphs Abs: 1.7 10*3/uL (ref 0.7–4.0)
MCH: 31.4 pg (ref 26.0–34.0)
MCHC: 32 g/dL (ref 30.0–36.0)
MCV: 98 fL (ref 80.0–100.0)
Monocytes Absolute: 0.3 10*3/uL (ref 0.1–1.0)
Monocytes Relative: 7 %
Neutro Abs: 2.3 10*3/uL (ref 1.7–7.7)
Neutrophils Relative %: 53 %
Platelets: 200 10*3/uL (ref 150–400)
RBC: 4.08 MIL/uL (ref 3.87–5.11)
RDW: 13.4 % (ref 11.5–15.5)
WBC: 4.4 10*3/uL (ref 4.0–10.5)
nRBC: 0 % (ref 0.0–0.2)

## 2023-09-20 LAB — BASIC METABOLIC PANEL WITH GFR
Anion gap: 5 (ref 5–15)
BUN: 10 mg/dL (ref 8–23)
CO2: 28 mmol/L (ref 22–32)
Calcium: 9.2 mg/dL (ref 8.9–10.3)
Chloride: 104 mmol/L (ref 98–111)
Creatinine, Ser: 0.77 mg/dL (ref 0.44–1.00)
GFR, Estimated: >60 mL/min (ref >60)
Glucose, Bld: 87 mg/dL (ref 70–99)
Potassium: 3.7 mmol/L (ref 3.5–5.1)
Sodium: 137 mmol/L (ref 135–145)

## 2023-09-20 NOTE — Discharge Instructions (Signed)
 You were seen in the ER today for evaluation after an episode of dizziness.  I am glad you are feeling better.  Your testing here was overall reassuring.  Follow with your primary care doctor for further evaluation.  Return to the ER for new or worsening symptoms.

## 2023-09-20 NOTE — ED Notes (Signed)
 Pt is unsure why they came to the ED (has memory impairment at baseline) Pt keeps stating that they get frustrated because they forget things when asked why they came tot he ER today. When prompted that being forgetful doesn't count as an emergency (as this is pt's baseline) pt states that they don't know why they are here. Pt's husband says that they had a normal morning and went and walked around per their usual and there was nothing out of the ordinary, but when they got home pt wanted to call their MD. Pt spoke with the RN and the husband says that the next thing they knew fire/EMS was there checking the pt out and was told they needed to do blood work, etc and they they would need to go to the ER for that. Pt is A&Ox3.

## 2023-09-20 NOTE — ED Provider Triage Note (Signed)
 Emergency Medicine Provider Triage Evaluation Note  Jessica Hardin , a 83 y.o. female  was evaluated in triage.  Pt complains of dizziness and "off balance" for a few weeks.  Worse with  getting up. No medication taken.  Hx of alzheimer  Review of Systems  Positive: dizziness Negative: No n,v,d.   Physical Exam  There were no vitals taken for this visit. Gen:   Awake, no distress  answers questions  Resp:  Normal effort  MSK:   Moves extremities without difficulty  Other:    Medical Decision Making  Medically screening exam initiated at 2:55 PM.  Appropriate orders placed.  TERREN HABERLE was informed that the remainder of the evaluation will be completed by another provider, this initial triage assessment does not replace that evaluation, and the importance of remaining in the ED until their evaluation is complete.     Tommi Rumps, PA-C 09/20/23 [phone]

## 2023-09-20 NOTE — ED Triage Notes (Signed)
 Patient arrives POV with husband c/o vertigo and "off balance" when walking for the past few weeks. Patient reports if she "gets up suddenly" she gets dizziness. Patient has hx of vertigo; states she did not take any meclizine for her symptoms. Patient is a&o x4.

## 2023-09-20 NOTE — ED Provider Notes (Signed)
 [facility] Provider Note    Event Date/Time   First MD Initiated Contact with Patient 09/20/23 1835     (approximate)   History   Dizziness   HPI  Jessica Hardin is a 83 year old female with history of HTN, Alzheimer's disease, vertigo presenting to the emergency department for evaluation of dizziness. Accompanied by husband who provides collateral history.  He reports that patient was behaving at her baseline earlier today.  She did state that she started to feel unwell and called into their doctor's office.  Patient does not remember how she felt, but telephone encounter in her chart reports that patient was reporting dizziness and inability to stand.  In the setting of this, EMS was called.  Patient denies any current dizziness or lightheadedness.  No falls or syncope.  No pain anywhere.  Family does report a history of vertigo and I do see this noted by her primary care doctor prior visits.  Has been feels the patient is at her baseline.  She was able to ambulate from the waiting room into her current room with stable gait.      Physical Exam   Triage Vital Signs: ED Triage Vitals  Encounter Vitals Group     BP 09/20/23 1501 (!) 159/78     Systolic BP Percentile --      Diastolic BP Percentile --      Pulse Rate 09/20/23 1501 65     Resp 09/20/23 1501 20     Temp 09/20/23 1501 98.6 F (37 C)     Temp Source 09/20/23 1501 Oral     SpO2 09/20/23 1501 100 %     Weight 09/20/23 1500 132 lb 4.4 oz (60 kg)     Height 09/20/23 1500 4\' 10"  (1.473 m)     Head Circumference --      Peak Flow --      Pain Score 09/20/23 1458 0     Pain Loc --      Pain Education --      Exclude from Growth Chart --     Most recent vital signs: Vitals:   09/20/23 1834 09/20/23 1920  BP: (!) 161/83 (!) 163/80  Pulse: 73 61  Resp: 16 13  Temp:  98.5 F (36.9 C)  SpO2: 94% 100%     General: Awake, interactive  CV:  Regular rate, good peripheral perfusion.   Resp:  Unlabored respirations, lungs clear to auscultation Abd:  Nondistended, soft, nontender Neuro:  Keenly aware though occasional confusion about situation, normal extraocular movements, no visual field cut, normal facial symmetry, 5 out of 5 strength in bilateral upper and lower extremities, normal finger-to-nose testing, normal sensation, no aphasia, dysarthria   ED Results / Procedures / Treatments   Labs (all labs ordered are listed, but only abnormal results are displayed) Labs Reviewed  CBC WITH DIFFERENTIAL/PLATELET  BASIC METABOLIC PANEL WITH GFR     EKG EKG independently reviewed interpreted by myself (ER attending) demonstrates:  EKG demonstrates sinus rhythm at a rate of 61, PR 168, QRS 92, QTc 426, no acute ST changes  RADIOLOGY Imaging independently reviewed and interpreted by myself demonstrates:  CT head without acute abnormality  PROCEDURES:  Critical Care performed: No  Procedures   MEDICATIONS ORDERED IN ED: Medications - No data to display   IMPRESSION / MDM / ASSESSMENT AND PLAN / ED COURSE  I reviewed the triage vital signs and the nursing notes.  Differential diagnosis includes, but  is not limited to, exacerbation of known history of vertigo, anemia, electrolyte abnormality, lower suspicion acute intracranial process given reassuring neurologic exam currently, clinical history not suggestive of TIA  Patient's presentation is most consistent with acute presentation with potential threat to life or bodily function.  83 year old female presenting after episode of reported dizziness earlier today, currently without complaints.  Labs and CT ordered from triage which are reassuring.  EKG overall reassuring.  I did discuss limitations of CT and that certain emergency findings such as stroke, brain mass would not be noted, though I have low suspicion for these based on her presentation with resolution of her symptoms.  Patient and husband feel that she is  she is at her baseline and they are comfortable with discharge with strict return precautions and outpatient follow-up.  Do think this is reasonable.  Strict return precautions provided.  Patient discharged stable condition.     FINAL CLINICAL IMPRESSION(S) / ED DIAGNOSES   Final diagnoses:  Dizziness     Rx / DC Orders   ED Discharge Orders     None        Note:  This document was prepared using Dragon voice recognition software and may include unintentional dictation errors.   Trinna Post, MD 09/20/23 [phone]

## 2023-09-20 NOTE — Telephone Encounter (Signed)
 Copied from CRM [id number]. Topic: Clinical - Red Word Triage >> Sep 20, 2023 12:54 PM Sonny Dandy B wrote: [facility] that prompted transfer to Nurse Triage: dizziness, nausea  Chief Complaint: dizziness, unable to stand, sounds very weak Symptoms: see above Frequency: TO ER Pertinent Negatives: Patient denies NA Disposition: [x] ED /[] Urgent Care (no appt availability in office) / [] Appointment(In office/virtual)/ []  [place] [facility]/ [] Home Care/ [] Refused Recommended Disposition /[] [place] [facility]/ []  Follow-up with PCP Additional Notes: this RN called 911 for transport to ER.  Reason for Disposition  Patient sounds very sick or weak to the triager  Answer Assessment - Initial Assessment Questions 1. DESCRIPTION: "Describe your dizziness."     Spinning dizzy and nausea 2. LIGHTHEADED: "Do you feel lightheaded?" (e.g., somewhat faint, woozy, weak upon standing)     When she stands up she feels like she is going to fall. 3. VERTIGO: "Do you feel like either you or the room is spinning or tilting?" (i.e. vertigo)     ER 4. SEVERITY: "How bad is it?"  "Do you feel like you are going to faint?" "Can you stand and walk?"   - MILD: Feels slightly dizzy, but walking normally.   - MODERATE: Feels unsteady when walking, but not falling; interferes with normal activities (e.g., school, work).   - SEVERE: Unable to walk without falling, or requires assistance to walk without falling; feels like passing out now.      ER 5. ONSET:  "When did the dizziness begin?"     ER 6. AGGRAVATING FACTORS: "Does anything make it worse?" (e.g., standing, change in head position)     ER 7. HEART RATE: "Can you tell me your heart rate?" "How many beats in 15 seconds?"  (Note: not all patients can do this)       ER 8. CAUSE: "What do you think is causing the dizziness?"     ER 9. RECURRENT SYMPTOM: "Have you had dizziness before?" If Yes, ask: "When was the last time?" "What happened that time?"      ER 10. OTHER SYMPTOMS: "Do you have any other symptoms?" (e.g., fever, chest pain, vomiting, diarrhea, bleeding)       ER 11. PREGNANCY: "Is there any chance you are pregnant?" "When was your last menstrual period?"       ER  Protocols used: Dizziness - Lightheadedness-A-AH

## 2023-09-21 ENCOUNTER — Telehealth: Payer: Self-pay

## 2023-09-21 NOTE — Transitions of Care (Post Inpatient/ED Visit) (Signed)
   09/21/2023  Name: [name] MRN: [id number] DOB: 1941-03-19  Today's TOC FU Call Status: Today's TOC FU Call Status:: Unsuccessful Call (1st Attempt) Unsuccessful Call (1st Attempt) Date: 09/21/23  Attempted to reach the patient regarding the most recent Inpatient/ED visit.  Follow Up Plan: Additional outreach attempts will be made to reach the patient to complete the Transitions of Care (Post Inpatient/ED visit) call.   Signature Karena Addison, LPN [facility] Nurse Health Advisor Direct Dial [phone]

## 2023-09-26 NOTE — Transitions of Care (Post Inpatient/ED Visit) (Signed)
   09/26/2023  Name: Jessica Hardin MRN: [id number] DOB: [birth year]  Today's TOC FU Call Status: Today's TOC FU Call Status:: Unsuccessful Call (2nd Attempt) Unsuccessful Call (1st Attempt) Date: 09/21/23 Unsuccessful Call (2nd Attempt) Date: 09/26/23  Attempted to reach the patient regarding the most recent Inpatient/ED visit.  Follow Up Plan: Additional outreach attempts will be made to reach the patient to complete the Transitions of Care (Post Inpatient/ED visit) call.   Signature Karena Addison, LPN [facility] Nurse Health Advisor Direct Dial [phone]

## 2023-09-26 NOTE — Transitions of Care (Post Inpatient/ED Visit) (Signed)
   09/26/2023  Name: Jessica Hardin MRN: [id number] DOB: 1940/10/22  Today's TOC FU Call Status: Today's TOC FU Call Status:: Unsuccessful Call (3rd Attempt) Unsuccessful Call (1st Attempt) Date: 09/21/23 Unsuccessful Call (2nd Attempt) Date: 09/26/23 Unsuccessful Call (3rd Attempt) Date: 09/26/23  Attempted to reach the patient regarding the most recent Inpatient/ED visit.  Follow Up Plan: Additional outreach attempts will be made to reach the patient to complete the Transitions of Care (Post Inpatient/ED visit) call.   Signature Karena Addison, LPN [facility] Nurse Health Advisor Direct Dial [phone]

## 2023-12-18 ENCOUNTER — Ambulatory Visit

## 2023-12-18 VITALS — BP 163/80 | Ht <= 58 in | Wt 140.0 lb

## 2023-12-18 DIAGNOSIS — Z0000 Encounter for general adult medical examination without abnormal findings: Secondary | ICD-10-CM | POA: Diagnosis not present

## 2023-12-18 NOTE — Progress Notes (Signed)
 Because this visit was a virtual/telehealth visit,  certain criteria was not obtained, such a blood pressure, CBG if applicable, and timed get up and go. Any medications not marked as taking were not mentioned during the medication reconciliation part of the visit. Any vitals not documented were not able to be obtained due to this being a telehealth visit or patient was unable to self-report a recent blood pressure reading due to a lack of equipment at home via telehealth. Vitals that have been documented are verbally provided by the patient.   This visit was performed by a medical professional under my direct supervision. I was immediately available for consultation/collaboration. I have reviewed and agree with the Annual Wellness Visit documentation.  Subjective:   Jessica Hardin is a 83 y.o. who presents for a Medicare Wellness preventive visit.  As a reminder, Annual Wellness Visits don't include a physical exam, and some assessments may be limited, especially if this visit is performed virtually. We may recommend an in-person follow-up visit with your provider if needed.  Visit Complete: Virtual I connected with  Jessica Hardin on 12/18/23 by a audio enabled telemedicine application and verified that I am speaking with the correct person using two identifiers.  Patient Location: Home  Provider Location: Home Office  I discussed the limitations of evaluation and management by telemedicine. The patient expressed understanding and agreed to proceed.  Vital Signs: Because this visit was a virtual/telehealth visit, some criteria may be missing or patient reported. Any vitals not documented were not able to be obtained and vitals that have been documented are patient reported.  VideoDeclined- This patient declined Librarian, academic. Therefore the visit was completed with audio only.  Persons Participating in Visit: Patient.  AWV Questionnaire: Yes: Patient  Medicare AWV questionnaire was completed by the patient on 12/14/2023; I have confirmed that all information answered by patient is correct and no changes since this date.  Cardiac Risk Factors include: advanced age (>32men, >3 women);hypertension     Objective:    Today's Vitals   12/18/23 1446 12/18/23 1448  BP: (!) 163/80   Weight: 140 lb (63.5 kg)   Height: 4' 10 (1.473 m)   PainSc:  0-No pain   Body mass index is 29.26 kg/m.     12/18/2023    2:46 PM 09/20/2023    3:05 PM 01/27/2023   11:09 AM 01/09/2023    2:24 PM 06/28/2022    2:59 PM 02/28/2022    4:19 PM 01/03/2022    3:11 PM  Advanced Directives  Does Patient Have a Medical Advance Directive? No No No No No No No  Does patient want to make changes to medical advance directive?       No - Patient declined  Would patient like information on creating a medical advance directive? No - Patient declined No - Patient declined No - Patient declined No - Patient declined No - Patient declined No - Patient declined No - Patient declined    Current Medications (verified) Outpatient Encounter Medications as of 12/18/2023  Medication Sig   amLODipine  (NORVASC ) 5 MG tablet Take 1 tablet (5 mg total) by mouth daily.   donepezil  (ARICEPT ) 5 MG tablet Take 1 tablet (5 mg total) by mouth at bedtime.   latanoprost (XALATAN) 0.005 % ophthalmic solution Place 1 drop into both eyes at bedtime.   mirtazapine  (REMERON ) 15 MG tablet Take 7.5 mg by mouth at bedtime.   Multiple Vitamin (MULTIVITAMIN) capsule Take 1  capsule by mouth daily.   ondansetron  (ZOFRAN -ODT) 4 MG disintegrating tablet Take 1 tablet (4 mg total) by mouth every 8 (eight) hours as needed for nausea or vomiting.   venlafaxine  XR (EFFEXOR -XR) 37.5 MG 24 hr capsule TAKE 1 CAPSULE BY MOUTH ONCE DAILY WITH BREAKFAST   Vitamin D , Ergocalciferol , (DRISDOL ) 1.25 MG (50000 UNIT) CAPS capsule Take 1 capsule (50,000 Units total) by mouth every 7 (seven) days.   No facility-administered  encounter medications on file as of 12/18/2023.    Allergies (verified) Shellfish allergy   History: Past Medical History:  Diagnosis Date   Allergy    Anxiety    Asthma    GERD (gastroesophageal reflux disease)    History of chicken pox    IBS (irritable bowel syndrome)    Migraine    Urinary tract infection    Past Surgical History:  Procedure Laterality Date   ABDOMINAL HYSTERECTOMY     CATARACT EXTRACTION, BILATERAL     TUBAL LIGATION     Family History  Problem Relation Age of Onset   Arthritis Mother    Hypertension Mother    Arthritis Sister    Hypertension Sister    Colon cancer Brother 23   Breast cancer Sister    Breast cancer Sister    Uterine cancer Sister 72   Multiple sclerosis Daughter    Social History   Socioeconomic History   Marital status: Married    Spouse name: Not on file   Number of children: 3   Years of education: Not on file   Highest education level: Associate degree: occupational, Scientist, product/process development, or vocational program  Occupational History   Not on file  Tobacco Use   Smoking status: Never   Smokeless tobacco: Never  Vaping Use   Vaping status: Never Used  Substance and Sexual Activity   Alcohol use: No    Alcohol/week: 0.0 standard drinks of alcohol   Drug use: No   Sexual activity: Yes    Birth control/protection: Surgical  Other Topics Concern   Not on file  Social History Narrative   Lives at home with her husband   Right handed   Caffeine: none   Social Drivers of Corporate investment banker Strain: Low Risk  (12/18/2023)   Overall Financial Resource Strain (CARDIA)    Difficulty of Paying Living Expenses: Not hard at all  Food Insecurity: No Food Insecurity (12/18/2023)   Hunger Vital Sign    Worried About Running Out of Food in the Last Year: Never true    Ran Out of Food in the Last Year: Never true  Transportation Needs: No Transportation Needs (12/18/2023)   PRAPARE - Administrator, Civil Service  (Medical): No    Lack of Transportation (Non-Medical): No  Physical Activity: Insufficiently Active (12/18/2023)   Exercise Vital Sign    Days of Exercise per Week: 2 days    Minutes of Exercise per Session: 20 min  Stress: No Stress Concern Present (12/18/2023)   [employer] of Occupational Health - Occupational Stress Questionnaire    Feeling of Stress: Not at all  Social Connections: Moderately Integrated (12/18/2023)   Social Connection and Isolation Panel    Frequency of Communication with Friends and Family: More than three times a week    Frequency of Social Gatherings with Friends and Family: Twice a week    Attends Religious Services: More than 4 times per year    Active Member of [employer] or Organizations: Yes  Attends Banker Meetings: Never    Marital Status: Widowed    Tobacco Counseling Counseling given: Not Answered    Clinical Intake:  Pre-visit preparation completed: Yes  Pain : 0-10 Pain Score: 0-No pain     BMI - recorded: 29.26 Nutritional Status: BMI 25 -29 Overweight Nutritional Risks: None Diabetes: No  No results found for: HGBA1C   How often do you need to have someone help you when you read instructions, pamphlets, or other written materials from your doctor or pharmacy?: 1 - Never  Interpreter Needed?: No  Information entered by :: [email or web address]   Activities of Daily Living     12/18/2023    2:51 PM 01/09/2023    2:18 PM  In your present state of health, do you have any difficulty performing the following activities:  Hearing? 0 0  Vision? 0 0  Difficulty concentrating or making decisions? 1 0  Walking or climbing stairs? 0 0  Dressing or bathing? 0 0  Doing errands, shopping? 0 0  Preparing Food and eating ? N N  Using the Toilet? N N  In the past six months, have you accidently leaked urine? N N  Do you have problems with loss of bowel control? N N  Managing your Medications? N N  Managing your Finances? N  N  Housekeeping or managing your Housekeeping? N N    Patient Care Team: Avelina Greig BRAVO, MD as PCP - General (Family Medicine) Defrancesco, Gladis LABOR, MD as Referring Physician (Obstetrics and Gynecology) Dessa Reyes ORN, MD (General Surgery) Dingeldein, Steven, MD as Referring Physician (Ophthalmology) Fate Morna SAILOR, [facility] (Inactive) as Pharmacist (Pharmacist)  I have updated your Care Teams any recent Medical Services you may have received from other providers in the past year.     Assessment:   This is a routine wellness examination for Jessica Hardin.  Hearing/Vision screen Hearing Screening - Comments:: No difficulties  Vision Screening - Comments:: Patient wears glasses    Goals Addressed             This Visit's Progress    Patient Stated   On track    Maintain good health       Depression Screen     12/18/2023    2:52 PM 06/28/2023   10:54 AM 04/26/2023    9:24 AM 01/09/2023    2:21 PM 12/26/2022    3:31 PM 11/29/2022    3:27 PM 03/07/2022   11:42 AM  PHQ 2/9 Scores  PHQ - 2 Score 0 3  1   2   PHQ- 9 Score 0 8     8  Exception Documentation   Medical reason  Medical reason Medical reason     Fall Risk     12/18/2023    2:50 PM 01/09/2023    2:25 PM 12/26/2022    3:29 PM 01/03/2022    3:10 PM 12/31/2020    3:25 PM  Fall Risk   Falls in the past year? 0 0 0 0 0  Number falls in past yr: 0 0 0 0 0  Injury with Fall? 0 0 0 0 0  Risk for fall due to : No Fall Risks Impaired vision;Impaired balance/gait No Fall Risks No Fall Risks   Follow up Falls evaluation completed Falls prevention discussed Falls evaluation completed      MEDICARE RISK AT HOME:  Medicare Risk at Home Any stairs in or around the home?: Yes If so, are there any without handrails?:  No Home free of loose throw rugs in walkways, pet beds, electrical cords, etc?: Yes Adequate lighting in your home to reduce risk of falls?: Yes Life alert?: No Use of a cane, walker or w/c?: No Grab bars in the  bathroom?: No Shower chair or bench in shower?: No Elevated toilet seat or a handicapped toilet?: Yes  TIMED UP AND GO:  Was the test performed?  No  Cognitive Function: 6CIT completed    06/13/2017   11:05 AM 11/04/2015    1:40 PM  MMSE - Mini Mental State Exam  Orientation to time 5  5   Orientation to Place 5  5   Registration 3  3   Attention/ Calculation 5  0   Recall 2  3   Language- name 2 objects 2  0   Language- repeat 1 1  Language- follow 3 step command 3  3   Language- read & follow direction 1  0   Write a sentence 1  0   Copy design 1  0   Total score 29  20      Data saved with a previous flowsheet row definition        01/03/2022    3:11 PM  6CIT Screen  What Year? 4 points  What month? 3 points  What time? 0 points  Count back from 20 0 points  Months in reverse 0 points  Repeat phrase 4 points  Total Score 11 points    Immunizations Immunization History  Administered Date(s) Administered   PFIZER(Purple Top)SARS-COV-2 Vaccination 09/06/2019, 09/30/2019, 06/05/2020   PNEUMOCOCCAL CONJUGATE-20 12/31/2020    Screening Tests Health Maintenance  Topic Date Due   Zoster Vaccines- Shingrix (1 of 2) Never done   COVID-19 Vaccine (4 - 2024-25 season) 02/18/2023   DEXA SCAN  01/09/2024 (Originally 11/17/2020)   INFLUENZA VACCINE  01/18/2024   MAMMOGRAM  06/23/2024   Medicare Annual Wellness (AWV)  12/17/2024   Pneumococcal Vaccine: 50+ Years  Completed   Hepatitis B Vaccines  Aged Out   HPV VACCINES  Aged Out   Meningococcal B Vaccine  Aged Out   DTaP/Tdap/Td  Discontinued    Health Maintenance  Health Maintenance Due  Topic Date Due   Zoster Vaccines- Shingrix (1 of 2) Never done   COVID-19 Vaccine (4 - 2024-25 season) 02/18/2023   Health Maintenance Items Addressed:   Additional Screening:  Vision Screening: Recommended annual ophthalmology exams for early detection of glaucoma and other disorders of the eye. Would you like a  referral to an eye doctor? No    Dental Screening: Recommended annual dental exams for proper oral hygiene  Community Resource Referral / Chronic Care Management: CRR required this visit?  No   CCM required this visit?  No   Plan:    I have personally reviewed and noted the following in the patient's chart:   Medical and social history Use of alcohol, tobacco or illicit drugs  Current medications and supplements including opioid prescriptions. Patient is not currently taking opioid prescriptions. Functional ability and status Nutritional status Physical activity Advanced directives List of other physicians Hospitalizations, surgeries, and ER visits in previous 12 months Vitals Screenings to include cognitive, depression, and falls Referrals and appointments  In addition, I have reviewed and discussed with patient certain preventive protocols, quality metrics, and best practice recommendations. A written personalized care plan for preventive services as well as general preventive health recommendations were provided to patient.   Lyle MARLA Right, CMA  12/18/2023   After Visit Summary: (MyChart) Due to this being a telephonic visit, the after visit summary with patients personalized plan was offered to patient via MyChart   Notes: Nothing significant to report at this time.

## 2023-12-18 NOTE — Patient Instructions (Signed)
 Jessica Hardin , Thank you for taking time out of your busy schedule to complete your Annual Wellness Visit with me. I enjoyed our conversation and look forward to speaking with you again next year. I, as well as your care team,  appreciate your ongoing commitment to your health goals. Please review the following plan we discussed and let me know if I can assist you in the future. Your Game plan/ To Do List    Referrals: If you haven't heard from the office you've been referred to, please reach out to them at the phone provided.  none Follow up Visits: Next Medicare AWV with our clinical staff: 12/18/2024   Have you seen your provider in the last 6 months (3 months if uncontrolled diabetes)? No Next Office Visit with your provider: n/a  Clinician Recommendations:  Aim for 30 minutes of exercise or brisk walking, 6-8 glasses of water, and 5 servings of fruits and vegetables each day.       This is a list of the screening recommended for you and due dates:  Health Maintenance  Topic Date Due   Zoster (Shingles) Vaccine (1 of 2) Never done   COVID-19 Vaccine (4 - 2024-25 season) 02/18/2023   DEXA scan (bone density measurement)  01/09/2024*   Flu Shot  01/18/2024   Mammogram  06/23/2024   Medicare Annual Wellness Visit  12/17/2024   Pneumococcal Vaccine for age over 39  Completed   Hepatitis B Vaccine  Aged Out   HPV Vaccine  Aged Out   Meningitis B Vaccine  Aged Out   DTaP/Tdap/Td vaccine  Discontinued  *Topic was postponed. The date shown is not the original due date.    Advanced directives: (Declined) Advance directive discussed with you today. Even though you declined this today, please call our office should you change your mind, and we can give you the proper paperwork for you to fill out. Advance Care Planning is important because it:  [x]  Makes sure you receive the medical care that is consistent with your values, goals, and preferences  [x]  It provides guidance to your family  and loved ones and reduces their decisional burden about whether or not they are making the right decisions based on your wishes.  Follow the link provided in your after visit summary or read over the paperwork we have mailed to you to help you started getting your Advance Directives in place. If you need assistance in completing these, please reach out to us  so that we can help you!  See attachments for Preventive Care and Fall Prevention Tips.

## 2023-12-24 NOTE — Progress Notes (Signed)
 Patient progress note was copied and note added to the 6CIT being declined because patient has Alzheimer Disease   Because this visit was a virtual/telehealth visit,  certain criteria was not obtained, such a blood pressure, CBG if applicable, and timed get up and go. Any medications not marked as taking were not mentioned during the medication reconciliation part of the visit. Any vitals not documented were not able to be obtained due to this being a telehealth visit or patient was unable to self-report a recent blood pressure reading due to a lack of equipment at home via telehealth. Vitals that have been documented are verbally provided by the patient.   This visit was performed by a medical professional under my direct supervision. I was immediately available for consultation/collaboration. I have reviewed and agree with the Annual Wellness Visit documentation.  Subjective:   Jessica Hardin is a 83 y.o. who presents for a Medicare Wellness preventive visit.  As a reminder, Annual Wellness Visits don't include a physical exam, and some assessments may be limited, especially if this visit is performed virtually. We may recommend an in-person follow-up visit with your provider if needed.  Visit Complete: Virtual I connected with  Jessica Hardin on 12/24/23 by a audio enabled telemedicine application and verified that I am speaking with the correct person using two identifiers.  Patient Location: Home  Provider Location: Home Office  I discussed the limitations of evaluation and management by telemedicine. The patient expressed understanding and agreed to proceed.  Vital Signs: Because this visit was a virtual/telehealth visit, some criteria may be missing or patient reported. Any vitals not documented were not able to be obtained and vitals that have been documented are patient reported.  VideoDeclined- This patient declined Librarian, academic. Therefore the  visit was completed with audio only.  Persons Participating in Visit: Patient.  AWV Questionnaire: Yes: Patient Medicare AWV questionnaire was completed by the patient on 12/14/2023; I have confirmed that all information answered by patient is correct and no changes since this date.  Cardiac Risk Factors include: advanced age (>72men, >92 women);hypertension     Objective:    Today's Vitals   12/18/23 1446 12/18/23 1448  BP: (!) 163/80   Weight: 140 lb (63.5 kg)   Height: 4' 10 (1.473 m)   PainSc:  0-No pain   Body mass index is 29.26 kg/m.     12/18/2023    2:46 PM 09/20/2023    3:05 PM 01/27/2023   11:09 AM 01/09/2023    2:24 PM 06/28/2022    2:59 PM 02/28/2022    4:19 PM 01/03/2022    3:11 PM  Advanced Directives  Does Patient Have a Medical Advance Directive? No No No No No No No  Does patient want to make changes to medical advance directive?       No - Patient declined  Would patient like information on creating a medical advance directive? No - Patient declined No - Patient declined No - Patient declined No - Patient declined No - Patient declined No - Patient declined No - Patient declined    Current Medications (verified) Outpatient Encounter Medications as of 12/18/2023  Medication Sig   amLODipine  (NORVASC ) 5 MG tablet Take 1 tablet (5 mg total) by mouth daily.   donepezil  (ARICEPT ) 5 MG tablet Take 1 tablet (5 mg total) by mouth at bedtime.   latanoprost (XALATAN) 0.005 % ophthalmic solution Place 1 drop into both eyes at bedtime.  mirtazapine  (REMERON ) 15 MG tablet Take 7.5 mg by mouth at bedtime.   Multiple Vitamin (MULTIVITAMIN) capsule Take 1 capsule by mouth daily.   ondansetron  (ZOFRAN -ODT) 4 MG disintegrating tablet Take 1 tablet (4 mg total) by mouth every 8 (eight) hours as needed for nausea or vomiting.   venlafaxine  XR (EFFEXOR -XR) 37.5 MG 24 hr capsule TAKE 1 CAPSULE BY MOUTH ONCE DAILY WITH BREAKFAST   Vitamin D , Ergocalciferol , (DRISDOL ) 1.25 MG (50000  UNIT) CAPS capsule Take 1 capsule (50,000 Units total) by mouth every 7 (seven) days.   No facility-administered encounter medications on file as of 12/18/2023.    Allergies (verified) Shellfish allergy   History: Past Medical History:  Diagnosis Date   Allergy    Anxiety    Asthma    GERD (gastroesophageal reflux disease)    History of chicken pox    IBS (irritable bowel syndrome)    Migraine    Urinary tract infection    Past Surgical History:  Procedure Laterality Date   ABDOMINAL HYSTERECTOMY     CATARACT EXTRACTION, BILATERAL     TUBAL LIGATION     Family History  Problem Relation Age of Onset   Arthritis Mother    Hypertension Mother    Arthritis Sister    Hypertension Sister    Colon cancer Brother 68   Breast cancer Sister    Breast cancer Sister    Uterine cancer Sister 34   Multiple sclerosis Daughter    Social History   Socioeconomic History   Marital status: Married    Spouse name: Not on file   Number of children: 3   Years of education: Not on file   Highest education level: Associate degree: occupational, Scientist, product/process development, or vocational program  Occupational History   Not on file  Tobacco Use   Smoking status: Never   Smokeless tobacco: Never  Vaping Use   Vaping status: Never Used  Substance and Sexual Activity   Alcohol use: No    Alcohol/week: 0.0 standard drinks of alcohol   Drug use: No   Sexual activity: Yes    Birth control/protection: Surgical  Other Topics Concern   Not on file  Social History Narrative   Lives at home with her husband   Hardin handed   Caffeine: none   Social Drivers of Corporate investment banker Strain: Low Risk  (12/18/2023)   Overall Financial Resource Strain (CARDIA)    Difficulty of Paying Living Expenses: Not hard at all  Food Insecurity: No Food Insecurity (12/18/2023)   Hunger Vital Sign    Worried About Running Out of Food in the Last Year: Never true    Ran Out of Food in the Last Year: Never true   Transportation Needs: No Transportation Needs (12/18/2023)   PRAPARE - Administrator, Civil Service (Medical): No    Lack of Transportation (Non-Medical): No  Physical Activity: Insufficiently Active (12/18/2023)   Exercise Vital Sign    Days of Exercise per Week: 2 days    Minutes of Exercise per Session: 20 min  Stress: No Stress Concern Present (12/18/2023)   [employer] of Occupational Health - Occupational Stress Questionnaire    Feeling of Stress: Not at all  Social Connections: Moderately Integrated (12/18/2023)   Social Connection and Isolation Panel    Frequency of Communication with Friends and Family: More than three times a week    Frequency of Social Gatherings with Friends and Family: Twice a week  Attends Religious Services: More than 4 times per year    Active Member of Clubs or Organizations: Yes    Attends Banker Meetings: Never    Marital Status: Widowed    Tobacco Counseling Counseling given: Not Answered    Clinical Intake:  Pre-visit preparation completed: Yes  Pain : 0-10 Pain Score: 0-No pain     BMI - recorded: 29.26 Nutritional Status: BMI 25 -29 Overweight Nutritional Risks: None Diabetes: No  No results found for: HGBA1C   How often do you need to have someone help you when you read instructions, pamphlets, or other written materials from your doctor or pharmacy?: 1 - Never  Interpreter Needed?: No  Information entered by :: [email or web address]   Activities of Daily Living     12/18/2023    2:51 PM 01/09/2023    2:18 PM  In your present state of health, do you have any difficulty performing the following activities:  Hearing? 0 0  Vision? 0 0  Difficulty concentrating or making decisions? 1 0  Walking or climbing stairs? 0 0  Dressing or bathing? 0 0  Doing errands, shopping? 0 0  Preparing Food and eating ? N N  Using the Toilet? N N  In the past six months, have you accidently leaked urine? N N   Do you have problems with loss of bowel control? N N  Managing your Medications? N N  Managing your Finances? N N  Housekeeping or managing your Housekeeping? N N    Patient Care Team: Avelina Greig BRAVO, MD as PCP - General (Family Medicine) Defrancesco, Gladis LABOR, MD as Referring Physician (Obstetrics and Gynecology) Dessa Reyes ORN, MD (General Surgery) Dingeldein, Steven, MD as Referring Physician (Ophthalmology) Fate Morna SAILOR, [facility] (Inactive) as Pharmacist (Pharmacist)  I have updated your Care Teams any recent Medical Services you may have received from other providers in the past year.     Assessment:   This is a routine wellness examination for [place].  Hearing/Vision screen Hearing Screening - Comments:: No difficulties  Vision Screening - Comments:: Patient wears glasses    Goals Addressed             This Visit's Progress    Patient Stated   On track    Maintain good health       Depression Screen     12/18/2023    2:52 PM 06/28/2023   10:54 AM 04/26/2023    9:24 AM 01/09/2023    2:21 PM 12/26/2022    3:31 PM 11/29/2022    3:27 PM 03/07/2022   11:42 AM  PHQ 2/9 Scores  PHQ - 2 Score 0 3  1   2   PHQ- 9 Score 0 8     8  Exception Documentation   Medical reason  Medical reason Medical reason     Fall Risk     12/18/2023    2:50 PM 01/09/2023    2:25 PM 12/26/2022    3:29 PM 01/03/2022    3:10 PM 12/31/2020    3:25 PM  Fall Risk   Falls in the past year? 0 0 0 0 0  Number falls in past yr: 0 0 0 0 0  Injury with Fall? 0 0 0 0 0  Risk for fall due to : No Fall Risks Impaired vision;Impaired balance/gait No Fall Risks No Fall Risks   Follow up Falls evaluation completed Falls prevention discussed Falls evaluation completed      MEDICARE RISK  AT HOME:  Medicare Risk at Home Any stairs in or around the home?: Yes If so, are there any without handrails?: No Home free of loose throw rugs in walkways, pet beds, electrical cords, etc?: Yes Adequate lighting  in your home to reduce risk of falls?: Yes Life alert?: No Use of a cane, walker or w/c?: No Grab bars in the bathroom?: No Shower chair or bench in shower?: No Elevated toilet seat or a handicapped toilet?: Yes  TIMED UP AND GO:  Was the test performed?  No  Cognitive Function: patient 6CIT was not performed at visit  due to patient having Alzheimer disease     06/13/2017   11:05 AM 11/04/2015    1:40 PM  MMSE - Mini Mental State Exam  Orientation to time 5  5   Orientation to Place 5  5   Registration 3  3   Attention/ Calculation 5  0   Recall 2  3   Language- name 2 objects 2  0   Language- repeat 1 1  Language- follow 3 step command 3  3   Language- read & follow direction 1  0   Write a sentence 1  0   Copy design 1  0   Total score 29  20      Data saved with a previous flowsheet row definition        01/03/2022    3:11 PM  6CIT Screen  What Year? 4 points  What month? 3 points  What time? 0 points  Count back from 20 0 points  Months in reverse 0 points  Repeat phrase 4 points  Total Score 11 points    Immunizations Immunization History  Administered Date(s) Administered   PFIZER(Purple Top)SARS-COV-2 Vaccination 09/06/2019, 09/30/2019, 06/05/2020   PNEUMOCOCCAL CONJUGATE-20 12/31/2020    Screening Tests Health Maintenance  Topic Date Due   Zoster Vaccines- Shingrix (1 of 2) Never done   COVID-19 Vaccine (4 - 2024-25 season) 02/18/2023   DEXA SCAN  01/09/2024 (Originally 11/17/2020)   INFLUENZA VACCINE  01/18/2024   MAMMOGRAM  06/23/2024   Medicare Annual Wellness (AWV)  12/17/2024   Pneumococcal Vaccine: 50+ Years  Completed   Hepatitis B Vaccines  Aged Out   HPV VACCINES  Aged Out   Meningococcal B Vaccine  Aged Out   DTaP/Tdap/Td  Discontinued    Health Maintenance  Health Maintenance Due  Topic Date Due   Zoster Vaccines- Shingrix (1 of 2) Never done   COVID-19 Vaccine (4 - 2024-25 season) 02/18/2023   Health Maintenance Items  Addressed:   Additional Screening:  Vision Screening: Recommended annual ophthalmology exams for early detection of glaucoma and other disorders of the eye. Would you like a referral to an eye doctor? No    Dental Screening: Recommended annual dental exams for proper oral hygiene  Community Resource Referral / Chronic Care Management: CRR required this visit?  No   CCM required this visit?  No   Plan:    I have personally reviewed and noted the following in the patient's chart:   Medical and social history Use of alcohol, tobacco or illicit drugs  Current medications and supplements including opioid prescriptions. Patient is not currently taking opioid prescriptions. Functional ability and status Nutritional status Physical activity Advanced directives List of other physicians Hospitalizations, surgeries, and ER visits in previous 12 months Vitals Screenings to include cognitive, depression, and falls Referrals and appointments  In addition, I have reviewed and discussed with  patient certain preventive protocols, quality metrics, and best practice recommendations. A written personalized care plan for preventive services as well as general preventive health recommendations were provided to patient.   Jessica Hardin, [place]   12/24/2023   After Visit Summary: (MyChart) Due to this being a telephonic visit, the after visit summary with patients personalized plan was offered to patient via MyChart   Notes: Nothing significant to report at this time.

## 2024-01-24 ENCOUNTER — Encounter: Payer: Self-pay | Admitting: Family Medicine

## 2024-01-24 ENCOUNTER — Ambulatory Visit: Payer: Self-pay | Admitting: Family Medicine

## 2024-01-24 ENCOUNTER — Ambulatory Visit (INDEPENDENT_AMBULATORY_CARE_PROVIDER_SITE_OTHER): Admitting: Family Medicine

## 2024-01-24 VITALS — BP 118/78 | HR 65 | Temp 98.4°F | Ht <= 58 in | Wt 135.6 lb

## 2024-01-24 DIAGNOSIS — I951 Orthostatic hypotension: Secondary | ICD-10-CM | POA: Diagnosis not present

## 2024-01-24 DIAGNOSIS — R42 Dizziness and giddiness: Secondary | ICD-10-CM

## 2024-01-24 DIAGNOSIS — F05 Delirium due to known physiological condition: Secondary | ICD-10-CM

## 2024-01-24 NOTE — Assessment & Plan Note (Addendum)
 Chronic, intermittent Patient's symptoms most consistent with orthostasis.  Likely poor p.o. intake and limited liquids contributing.  I encouraged her to go from sitting to standing slowly and to push fluids.  Her description is not currently consistent with vertigo and there is no suggestion of inner ear pathology.  Dementia is playing into her dizziness significantly as she forgets to eat and drink regularly and forgets that we have discussed this in detail.    We will evaluate with labs to make sure there is no secondary cause.

## 2024-01-24 NOTE — Telephone Encounter (Signed)
 FYI Only or Action Required?: FYI only for provider.  Patient was last seen in primary care on 06/28/2023 by Avelina Greig BRAVO, MD.  Called Nurse Triage reporting Dizziness.  Symptoms began several months ago.  Symptoms are: unchanged.  Triage Disposition: See PCP When Office is Open (Within 3 Days)  Patient/caregiver understands and will follow disposition?: Yes                  Copied from CRM [id number]. Topic: Clinical - Red Word Triage >> Jan 24, 2024 11:56 AM Suzen RAMAN wrote: Red Word that prompted transfer to Nurse Triage: nervousness and confusion Reason for Disposition  [1] MODERATE dizziness (e.g., interferes with normal activities) AND [2] has been evaluated by doctor (or NP/PA) for this  Answer Assessment - Initial Assessment Questions Patient scheduled for an appointment today in office.   Dizziness intermittent going on for a while Pt husband states she is not taking her medications or eating in morning; pt husband states the dizziness goes away after she eats Denies falling Difficulty with balance Pt states she has been to doctor for this in past Denies difficulty breathing, chest pain  Protocols used: Dizziness - Lightheadedness-A-AH

## 2024-01-24 NOTE — Telephone Encounter (Signed)
 Noted

## 2024-01-24 NOTE — Progress Notes (Signed)
 Patient ID: Jessica Hardin, female    DOB: 1941/02/14, 83 y.o.   MRN: [id number]  This visit was conducted in person.  BP 118/78   Pulse 65   Temp 98.4 F (36.9 C) (Oral)   Ht 4' 10 (1.473 m)   Wt 135 lb 9.6 oz (61.5 kg)   SpO2 99%   BMI 28.34 kg/m    CC:  Chief Complaint  Patient presents with   Dizziness    C/o Dizziness, also concerned about memory    Subjective:   HPI: Jessica Hardin is a 83 y.o. female presenting on 01/24/2024 for Dizziness (C/o Dizziness, also concerned about memory)   Pt presents with her husband. She has a history of dementia, anxiety and  and recurrent dizziness.  Per husband she does not eat regularly  She has one ensure daily.   No chest pain.   She states she feels off balance some times.   Per husband she sits and sleep a lot each day.. not new. Drinks lots of decaf tea.  Dementia, worsening memory per pt.  Husband states it is stable overall on aricept  5 mg daily.  She continued to be more confused at night,  sundowning.   Drinking ensure  Wt Readings from Last 3 Encounters:  01/24/24 135 lb 9.6 oz (61.5 kg)  12/18/23 140 lb (63.5 kg)  09/20/23 132 lb 4.4 oz (60 kg)   Her mood is moderately controlled on Remeron  at night and venlafaxine  daily     Did have ED visit in 4.2025 for same issue... head CT nml. Relevant past medical, surgical, family and social history reviewed and updated as indicated. Interim medical history since our last visit reviewed. Allergies and medications reviewed and updated. Outpatient Medications Prior to Visit  Medication Sig Dispense Refill   amLODipine  (NORVASC ) 5 MG tablet Take 1 tablet (5 mg total) by mouth daily. 90 tablet 3   donepezil  (ARICEPT ) 5 MG tablet Take 1 tablet (5 mg total) by mouth at bedtime. 90 tablet 3   latanoprost (XALATAN) 0.005 % ophthalmic solution Place 1 drop into both eyes at bedtime.     mirtazapine  (REMERON ) 15 MG tablet Take 7.5 mg by mouth at bedtime.     Multiple  Vitamin (MULTIVITAMIN) capsule Take 1 capsule by mouth daily.     ondansetron  (ZOFRAN -ODT) 4 MG disintegrating tablet Take 1 tablet (4 mg total) by mouth every 8 (eight) hours as needed for nausea or vomiting. 20 tablet 0   venlafaxine  XR (EFFEXOR -XR) 37.5 MG 24 hr capsule TAKE 1 CAPSULE BY MOUTH ONCE DAILY WITH BREAKFAST 30 capsule 5   Vitamin D , Ergocalciferol , (DRISDOL ) 1.25 MG (50000 UNIT) CAPS capsule Take 1 capsule (50,000 Units total) by mouth every 7 (seven) days. 12 capsule 00   No facility-administered medications prior to visit.     Per HPI unless specifically indicated in ROS section below Review of Systems  Constitutional:  Negative for fatigue and fever.  HENT:  Negative for congestion.   Eyes:  Negative for pain.  Respiratory:  Negative for cough and shortness of breath.   Cardiovascular:  Negative for chest pain, palpitations and leg swelling.  Gastrointestinal:  Negative for abdominal pain.  Genitourinary:  Negative for dysuria and vaginal bleeding.  Musculoskeletal:  Negative for back pain.  Neurological:  Negative for syncope, light-headedness and headaches.  Psychiatric/Behavioral:  Negative for dysphoric mood.    Objective:  BP 118/78   Pulse 65   Temp 98.4 F (36.9  C) (Oral)   Ht 4' 10 (1.473 m)   Wt 135 lb 9.6 oz (61.5 kg)   SpO2 99%   BMI 28.34 kg/m   Wt Readings from Last 3 Encounters:  01/24/24 135 lb 9.6 oz (61.5 kg)  12/18/23 140 lb (63.5 kg)  09/20/23 132 lb 4.4 oz (60 kg)      Physical Exam Constitutional:      General: She is not in acute distress.    Appearance: Normal appearance. She is well-developed. She is not ill-appearing or toxic-appearing.  HENT:     Head: Normocephalic.     Right Ear: Hearing, tympanic membrane, ear canal and external ear normal. Tympanic membrane is not erythematous, retracted or bulging.     Left Ear: Hearing, tympanic membrane, ear canal and external ear normal. Tympanic membrane is not erythematous, retracted  or bulging.     Nose: No mucosal edema or rhinorrhea.     Right Sinus: No maxillary sinus tenderness or frontal sinus tenderness.     Left Sinus: No maxillary sinus tenderness or frontal sinus tenderness.     Mouth/Throat:     Pharynx: Uvula midline.  Eyes:     General: Lids are normal. Lids are everted, no foreign bodies appreciated.     Conjunctiva/sclera: Conjunctivae normal.     Pupils: Pupils are equal, round, and reactive to light.  Neck:     Thyroid : No thyroid  mass or thyromegaly.     Vascular: No carotid bruit.     Trachea: Trachea normal.  Cardiovascular:     Rate and Rhythm: Normal rate and regular rhythm.     Pulses: Normal pulses.     Heart sounds: Normal heart sounds, S1 normal and S2 normal. No murmur heard.    No friction rub. No gallop.  Pulmonary:     Effort: Pulmonary effort is normal. No tachypnea or respiratory distress.     Breath sounds: Normal breath sounds. No decreased breath sounds, wheezing, rhonchi or rales.  Abdominal:     General: Bowel sounds are normal.     Palpations: Abdomen is soft.     Tenderness: There is no abdominal tenderness.  Musculoskeletal:     Cervical back: Normal range of motion and neck supple.  Skin:    General: Skin is warm and dry.     Findings: No rash.  Neurological:     Mental Status: She is alert.  Psychiatric:        Mood and Affect: Mood is not anxious or depressed.        Speech: Speech normal.        Behavior: Behavior normal. Behavior is cooperative.        Thought Content: Thought content normal.        Judgment: Judgment normal.       Results for orders placed or performed during the hospital encounter of 09/20/23  CBC with Differential   Collection Time: 09/20/23  3:08 PM  Result Value Ref Range   WBC 4.4 4.0 - 10.5 K/uL   RBC 4.08 3.87 - 5.11 MIL/uL   Hemoglobin 12.8 12.0 - 15.0 g/dL   HCT 59.9 63.9 - 53.9 %   MCV 98.0 80.0 - 100.0 fL   MCH 31.4 26.0 - 34.0 pg   MCHC 32.0 30.0 - 36.0 g/dL   RDW 86.5  88.4 - 84.4 %   Platelets 200 150 - 400 K/uL   nRBC 0.0 0.0 - 0.2 %   Neutrophils Relative % 53 %  Neutro Abs 2.3 1.7 - 7.7 K/uL   Lymphocytes Relative 39 %   Lymphs Abs 1.7 0.7 - 4.0 K/uL   Monocytes Relative 7 %   Monocytes Absolute 0.3 0.1 - 1.0 K/uL   Eosinophils Relative 1 %   Eosinophils Absolute 0.0 0.0 - 0.5 K/uL   Basophils Relative 0 %   Basophils Absolute 0.0 0.0 - 0.1 K/uL   Immature Granulocytes 0 %   Abs Immature Granulocytes 0.01 0.00 - 0.07 K/uL  Basic metabolic panel   Collection Time: 09/20/23  3:08 PM  Result Value Ref Range   Sodium 137 135 - 145 mmol/L   Potassium 3.7 3.5 - 5.1 mmol/L   Chloride 104 98 - 111 mmol/L   CO2 28 22 - 32 mmol/L   Glucose, Bld 87 70 - 99 mg/dL   BUN 10 8 - 23 mg/dL   Creatinine, Ser 9.22 0.44 - 1.00 mg/dL   Calcium  9.2 8.9 - 10.3 mg/dL   GFR, Estimated >39 >39 mL/min   Anion gap 5 5 - 15    Assessment and Plan  Dizziness Assessment & Plan: Chronic, intermittent Patient's symptoms most consistent with orthostasis.  Likely poor p.o. intake and limited liquids contributing.  I encouraged her to go from sitting to standing slowly and to push fluids.  Her description is not currently consistent with vertigo and there is no suggestion of inner ear pathology.  Dementia is playing into her dizziness significantly as she forgets to eat and drink regularly and forgets that we have discussed this in detail.    We will evaluate with labs to make sure there is no secondary cause.  Orders: -     Comprehensive metabolic panel with GFR -     CBC with Differential/Platelet -     TSH -     Vitamin B12  Orthostasis  Sundowning Assessment & Plan: Chronic, she continues to have issues with sundowning.  Overall patient and husband state memory is not significantly worse overall.  Continue Aricept  5 mg daily.  Did offer possible increase in this medication but we will hold off at this time given not clearly worse.     Return in about  3 months (around 04/25/2024) for annual physical with fasting labs prior.   Greig Ring, MD

## 2024-01-24 NOTE — Assessment & Plan Note (Signed)
 Chronic, she continues to have issues with sundowning.  Overall patient and husband state memory is not significantly worse overall.  Continue Aricept  5 mg daily.  Did offer possible increase in this medication but we will hold off at this time given not clearly worse.

## 2024-01-25 ENCOUNTER — Ambulatory Visit: Payer: Self-pay | Admitting: Family Medicine

## 2024-01-25 LAB — CBC WITH DIFFERENTIAL/PLATELET
Basophils Absolute: 0 K/uL (ref 0.0–0.1)
Basophils Relative: 0.5 % (ref 0.0–3.0)
Eosinophils Absolute: 0 K/uL (ref 0.0–0.7)
Eosinophils Relative: 0.7 % (ref 0.0–5.0)
HCT: 38.5 % (ref 36.0–46.0)
Hemoglobin: 12.7 g/dL (ref 12.0–15.0)
Lymphocytes Relative: 46.1 % — ABNORMAL HIGH (ref 12.0–46.0)
Lymphs Abs: 1.9 K/uL (ref 0.7–4.0)
MCHC: 32.9 g/dL (ref 30.0–36.0)
MCV: 96.2 fl (ref 78.0–100.0)
Monocytes Absolute: 0.4 K/uL (ref 0.1–1.0)
Monocytes Relative: 10.3 % (ref 3.0–12.0)
Neutro Abs: 1.8 K/uL (ref 1.4–7.7)
Neutrophils Relative %: 42.4 % — ABNORMAL LOW (ref 43.0–77.0)
Platelets: 226 K/uL (ref 150.0–400.0)
RBC: 4 Mil/uL (ref 3.87–5.11)
RDW: 13.7 % (ref 11.5–15.5)
WBC: 4.2 K/uL (ref 4.0–10.5)

## 2024-01-25 LAB — COMPREHENSIVE METABOLIC PANEL WITH GFR
ALT: 12 U/L (ref 0–35)
AST: 18 U/L (ref 0–37)
Albumin: 3.9 g/dL (ref 3.5–5.2)
Alkaline Phosphatase: 80 U/L (ref 39–117)
BUN: 8 mg/dL (ref 6–23)
CO2: 34 meq/L — ABNORMAL HIGH (ref 19–32)
Calcium: 9.3 mg/dL (ref 8.4–10.5)
Chloride: 101 meq/L (ref 96–112)
Creatinine, Ser: 0.85 mg/dL (ref 0.40–1.20)
GFR: 62.93 mL/min (ref >60.00)
Glucose, Bld: 67 mg/dL — ABNORMAL LOW (ref 70–99)
Potassium: 3.8 meq/L (ref 3.5–5.1)
Sodium: 141 meq/L (ref 135–145)
Total Bilirubin: 0.3 mg/dL (ref 0.2–1.2)
Total Protein: 7.4 g/dL (ref 6.0–8.3)

## 2024-01-25 LAB — VITAMIN B12: Vitamin B-12: 438 pg/mL (ref 211–911)

## 2024-01-25 LAB — TSH: TSH: 0.95 u[IU]/mL (ref 0.35–5.50)

## 2024-02-26 DIAGNOSIS — Z961 Presence of intraocular lens: Secondary | ICD-10-CM | POA: Diagnosis not present

## 2024-02-26 DIAGNOSIS — H401134 Primary open-angle glaucoma, bilateral, indeterminate stage: Secondary | ICD-10-CM | POA: Diagnosis not present

## 2024-03-18 ENCOUNTER — Other Ambulatory Visit: Payer: Self-pay | Admitting: Family Medicine

## 2024-03-18 ENCOUNTER — Telehealth: Payer: Self-pay | Admitting: *Deleted

## 2024-03-18 DIAGNOSIS — E559 Vitamin D deficiency, unspecified: Secondary | ICD-10-CM

## 2024-03-18 DIAGNOSIS — E7800 Pure hypercholesterolemia, unspecified: Secondary | ICD-10-CM

## 2024-03-18 NOTE — Telephone Encounter (Signed)
Please schedule CPE with fasting labs prior with Dr. Bedsole.  

## 2024-03-18 NOTE — Telephone Encounter (Signed)
-----   Message from Veva JINNY Ferrari sent at 03/18/2024  2:43 PM EDT ----- Regarding: Lab orders for Tue, 10.7.25 Patient is scheduled for CPX labs, please order future labs, Thanks , Veva

## 2024-03-25 ENCOUNTER — Ambulatory Visit: Payer: Self-pay | Admitting: Family Medicine

## 2024-03-25 ENCOUNTER — Other Ambulatory Visit (INDEPENDENT_AMBULATORY_CARE_PROVIDER_SITE_OTHER)

## 2024-03-25 DIAGNOSIS — E559 Vitamin D deficiency, unspecified: Secondary | ICD-10-CM

## 2024-03-25 DIAGNOSIS — E7800 Pure hypercholesterolemia, unspecified: Secondary | ICD-10-CM | POA: Diagnosis not present

## 2024-03-25 LAB — LIPID PANEL
Cholesterol: 169 mg/dL (ref 0–200)
HDL: 70 mg/dL (ref >39.00)
LDL Cholesterol: 85 mg/dL (ref 0–99)
NonHDL: 99.02
Total CHOL/HDL Ratio: 2
Triglycerides: 69 mg/dL (ref 0.0–149.0)
VLDL: 13.8 mg/dL (ref 0.0–40.0)

## 2024-03-25 LAB — COMPREHENSIVE METABOLIC PANEL WITH GFR
ALT: 12 U/L (ref 0–35)
AST: 19 U/L (ref 0–37)
Albumin: 3.9 g/dL (ref 3.5–5.2)
Alkaline Phosphatase: 73 U/L (ref 39–117)
BUN: 12 mg/dL (ref 6–23)
CO2: 33 meq/L — ABNORMAL HIGH (ref 19–32)
Calcium: 9.4 mg/dL (ref 8.4–10.5)
Chloride: 100 meq/L (ref 96–112)
Creatinine, Ser: 0.89 mg/dL (ref 0.40–1.20)
GFR: 59.48 mL/min — ABNORMAL LOW (ref >60.00)
Glucose, Bld: 86 mg/dL (ref 70–99)
Potassium: 3.7 meq/L (ref 3.5–5.1)
Sodium: 138 meq/L (ref 135–145)
Total Bilirubin: 0.4 mg/dL (ref 0.2–1.2)
Total Protein: 7.4 g/dL (ref 6.0–8.3)

## 2024-03-25 LAB — VITAMIN D 25 HYDROXY (VIT D DEFICIENCY, FRACTURES): VITD: 29.84 ng/mL — ABNORMAL LOW (ref 30.00–100.00)

## 2024-03-25 NOTE — Progress Notes (Signed)
 No critical labs need to be addressed urgently. We will discuss labs in detail at upcoming office visit.

## 2024-04-01 ENCOUNTER — Ambulatory Visit (INDEPENDENT_AMBULATORY_CARE_PROVIDER_SITE_OTHER): Admitting: Family Medicine

## 2024-04-01 ENCOUNTER — Encounter: Payer: Self-pay | Admitting: Family Medicine

## 2024-04-01 VITALS — BP 108/60 | HR 69 | Temp 98.3°F | Ht <= 58 in | Wt 132.0 lb

## 2024-04-01 DIAGNOSIS — Z0000 Encounter for general adult medical examination without abnormal findings: Secondary | ICD-10-CM | POA: Diagnosis not present

## 2024-04-01 DIAGNOSIS — I10 Essential (primary) hypertension: Secondary | ICD-10-CM | POA: Diagnosis not present

## 2024-04-01 DIAGNOSIS — E7800 Pure hypercholesterolemia, unspecified: Secondary | ICD-10-CM

## 2024-04-01 DIAGNOSIS — M8580 Other specified disorders of bone density and structure, unspecified site: Secondary | ICD-10-CM

## 2024-04-01 DIAGNOSIS — F320 Major depressive disorder, single episode, mild: Secondary | ICD-10-CM | POA: Diagnosis not present

## 2024-04-01 DIAGNOSIS — G309 Alzheimer's disease, unspecified: Secondary | ICD-10-CM

## 2024-04-01 DIAGNOSIS — N644 Mastodynia: Secondary | ICD-10-CM

## 2024-04-01 DIAGNOSIS — F0280 Dementia in other diseases classified elsewhere without behavioral disturbance: Secondary | ICD-10-CM | POA: Diagnosis not present

## 2024-04-01 NOTE — Assessment & Plan Note (Signed)
 Chronic, stable control   Remeron  7.5 mg daily at bedtime  Venlafaxine  37.5 mg in AM.

## 2024-04-01 NOTE — Assessment & Plan Note (Signed)
Stable, chronic.  Continue current medication.   Amlodipine 5 mg daily 

## 2024-04-01 NOTE — Progress Notes (Signed)
 Patient ID: Jessica Hardin, female    DOB: 07/12/1940, 83 y.o.   MRN: [id number]  This visit was conducted in person.  BP 108/60   Pulse 69   Temp 98.3 F (36.8 C) (Temporal)   Ht 4' 10 (1.473 m)   Wt 132 lb (59.9 kg)   SpO2 98%   BMI 27.59 kg/m    CC:  Chief Complaint  Patient presents with   Annual Exam    MWV 12/18/23    Subjective:   HPI: The patient presents for annual medicare wellness, complete physical and review of chronic health problems. He/She also has the following acute concerns today:  right breast pain x  a while  The patient saw a LPN or RN for medicare wellness visit.12/18/2023  Prevention and wellness was reviewed in detail. Note reviewed and important notes copied below.  Screening hearing and vision: done  Patient is poor historian, majority of history provided by husband     01/24/2024    3:50 PM 12/18/2023    2:50 PM 01/09/2023    2:25 PM 12/26/2022    3:29 PM 01/03/2022    3:10 PM  Fall Risk   Falls in the past year? 0 0 0 0 0  Number falls in past yr:  0 0 0 0  Injury with Fall?  0 0 0 0  Risk for fall due to :  No Fall Risks Impaired vision;Impaired balance/gait No Fall Risks No Fall Risks  Follow up  Falls evaluation completed Falls prevention discussed Falls evaluation completed    Flowsheet Row Office Visit from 01/24/2024 in [facility] Total Score 2   MDD, GAD, chronic insomnia: Followed by psychiatry. Reviewed Dr. [place] last OV.   Alzheimer's disease followed by neurology, Dr. Lane: On donezepil 5 mg daily   Reviewed MrI brain from 08/11/2020  IMPRESSION: 1. No acute intracranial abnormality. 2. Slight progression of mild cerebral atrophy. 3. Minimal chronic small vessel ischemic disease.  Hypertension:    Good control on amlodipine  5 mg daily BP Readings from Last 3 Encounters:  04/01/24 108/60  01/24/24 118/78  12/18/23 (!) 163/80  Using medication without problems or  lightheadedness: none Chest pain with exertion: none Edema:none Short of breath: none Average home BPs: Other issues:  Diet healthy  Body mass index is 27.59 kg/m.  Wt Readings from Last 3 Encounters:  04/01/24 132 lb (59.9 kg)  01/24/24 135 lb 9.6 oz (61.5 kg)  12/18/23 140 lb (63.5 kg)    Exercise: walking off and on.  Reviewed labs in detail with pt  Lab Results  Component Value Date   CHOL 169 03/25/2024   HDL 70.00 03/25/2024   LDLCALC 85 03/25/2024   TRIG 69.0 03/25/2024   CHOLHDL 2 03/25/2024     Poor appetite, drinking some tea. Wt Readings from Last 3 Encounters:  04/01/24 132 lb (59.9 kg)  01/24/24 135 lb 9.6 oz (61.5 kg)  12/18/23 140 lb (63.5 kg)         Relevant past medical, surgical, family and social history reviewed and updated as indicated. Interim medical history since our last visit reviewed. Allergies and medications reviewed and updated. Outpatient Medications Prior to Visit  Medication Sig Dispense Refill   amLODipine  (NORVASC ) 5 MG tablet Take 1 tablet (5 mg total) by mouth daily. 90 tablet 3   donepezil  (ARICEPT ) 5 MG tablet Take 1 tablet (5 mg total) by mouth at bedtime. 90  tablet 3   latanoprost (XALATAN) 0.005 % ophthalmic solution Place 1 drop into both eyes at bedtime.     mirtazapine  (REMERON ) 15 MG tablet Take 7.5 mg by mouth at bedtime.     Multiple Vitamin (MULTIVITAMIN) capsule Take 1 capsule by mouth daily.     ondansetron  (ZOFRAN -ODT) 4 MG disintegrating tablet Take 1 tablet (4 mg total) by mouth every 8 (eight) hours as needed for nausea or vomiting. 20 tablet 0   venlafaxine  XR (EFFEXOR -XR) 37.5 MG 24 hr capsule TAKE 1 CAPSULE BY MOUTH ONCE DAILY WITH BREAKFAST 30 capsule 2   Vitamin D , Ergocalciferol , (DRISDOL ) 1.25 MG (50000 UNIT) CAPS capsule Take 1 capsule (50,000 Units total) by mouth every 7 (seven) days. 12 capsule 00   No facility-administered medications prior to visit.     Per HPI unless specifically indicated in  ROS section below Review of Systems  Constitutional:  Negative for fatigue and fever.  HENT:  Negative for congestion.   Eyes:  Negative for pain.  Respiratory:  Negative for cough and shortness of breath.   Cardiovascular:  Negative for chest pain, palpitations and leg swelling.  Gastrointestinal:  Negative for abdominal pain.  Genitourinary:  Negative for dysuria and vaginal bleeding.  Musculoskeletal:  Negative for back pain.  Neurological:  Negative for syncope, light-headedness and headaches.  Psychiatric/Behavioral:  Negative for dysphoric mood.    Objective:  BP 108/60   Pulse 69   Temp 98.3 F (36.8 C) (Temporal)   Ht 4' 10 (1.473 m)   Wt 132 lb (59.9 kg)   SpO2 98%   BMI 27.59 kg/m   Wt Readings from Last 3 Encounters:  04/01/24 132 lb (59.9 kg)  01/24/24 135 lb 9.6 oz (61.5 kg)  12/18/23 140 lb (63.5 kg)      Physical Exam Constitutional:      General: She is not in acute distress.    Appearance: Normal appearance. She is well-developed. She is not ill-appearing or toxic-appearing.  HENT:     Head: Normocephalic.     Right Ear: Hearing, tympanic membrane, ear canal and external ear normal. Tympanic membrane is not erythematous, retracted or bulging.     Left Ear: Hearing, tympanic membrane, ear canal and external ear normal. Tympanic membrane is not erythematous, retracted or bulging.     Nose: No mucosal edema or rhinorrhea.     Right Sinus: No maxillary sinus tenderness or frontal sinus tenderness.     Left Sinus: No maxillary sinus tenderness or frontal sinus tenderness.     Mouth/Throat:     Pharynx: Uvula midline.  Eyes:     General: Lids are normal. Lids are everted, no foreign bodies appreciated.     Conjunctiva/sclera: Conjunctivae normal.     Pupils: Pupils are equal, round, and reactive to light.  Neck:     Thyroid : No thyroid  mass or thyromegaly.     Vascular: No carotid bruit.     Trachea: Trachea normal.  Cardiovascular:     Rate and Rhythm:  Normal rate and regular rhythm.     Pulses: Normal pulses.     Heart sounds: Normal heart sounds, S1 normal and S2 normal. No murmur heard.    No friction rub. No gallop.  Pulmonary:     Effort: Pulmonary effort is normal. No tachypnea or respiratory distress.     Breath sounds: Normal breath sounds. No decreased breath sounds, wheezing, rhonchi or rales.  Chest:  Breasts:    Breasts are symmetrical.  Right: Tenderness present. No swelling, inverted nipple, mass, nipple discharge or skin change.     Left: Normal. No swelling, inverted nipple, mass, nipple discharge, skin change or tenderness.    Abdominal:     General: Bowel sounds are normal.     Palpations: Abdomen is soft.     Tenderness: There is no abdominal tenderness.  Musculoskeletal:     Cervical back: Normal range of motion and neck supple.  Lymphadenopathy:     Upper Body:     Right upper body: No supraclavicular or axillary adenopathy.     Left upper body: No supraclavicular or axillary adenopathy.  Skin:    General: Skin is warm and dry.     Findings: No rash.  Neurological:     Mental Status: She is alert and oriented to person, place, and time.     Cranial Nerves: Cranial nerves 2-12 are intact.     Sensory: Sensation is intact.     Motor: Motor function is intact.  Psychiatric:        Mood and Affect: Mood is not anxious or depressed.        Speech: Speech is tangential.        Behavior: Behavior is withdrawn. Behavior is cooperative.        Thought Content: Thought content normal.        Cognition and Memory: Memory is impaired.        Judgment: Judgment normal.       Results for orders placed or performed in visit on 03/25/24  Comprehensive metabolic panel   Collection Time: 03/25/24  9:16 AM  Result Value Ref Range   Sodium 138 135 - 145 mEq/L   Potassium 3.7 3.5 - 5.1 mEq/L   Chloride 100 96 - 112 mEq/L   CO2 33 (H) 19 - 32 mEq/L   Glucose, Bld 86 70 - 99 mg/dL   BUN 12 6 - 23 mg/dL    Creatinine, Ser 9.10 0.40 - 1.20 mg/dL   Total Bilirubin 0.4 0.2 - 1.2 mg/dL   Alkaline Phosphatase 73 39 - 117 U/L   AST 19 0 - 37 U/L   ALT 12 0 - 35 U/L   Total Protein 7.4 6.0 - 8.3 g/dL   Albumin 3.9 3.5 - 5.2 g/dL   GFR 40.51 (L) >39.99 mL/min   Calcium  9.4 8.4 - 10.5 mg/dL  VITAMIN D  25 Hydroxy (Vit-D Deficiency, Fractures)   Collection Time: 03/25/24  9:16 AM  Result Value Ref Range   VITD 29.84 (L) 30.00 - 100.00 ng/mL  Lipid panel   Collection Time: 03/25/24  9:16 AM  Result Value Ref Range   Cholesterol 169 0 - 200 mg/dL   Triglycerides 30.9 0.0 - 149.0 mg/dL   HDL 29.99 >60.99 mg/dL   VLDL 86.1 0.0 - 59.9 mg/dL   LDL Cholesterol 85 0 - 99 mg/dL   Total CHOL/HDL Ratio 2    NonHDL 99.02     This visit occurred during the SARS-CoV-2 public health emergency.  Safety protocols were in place, including screening questions prior to the visit, additional usage of staff PPE, and extensive cleaning of exam room while observing appropriate contact time as indicated for disinfecting solutions.   COVID 19 screen:  No recent travel or known exposure to COVID19 The patient denies respiratory symptoms of COVID 19 at this time. The importance of social distancing was discussed today.   Assessment and Plan   The patient's preventative maintenance and recommended screening tests  for an annual wellness exam were reviewed in full today. Brought up to date unless services declined.  Counselled on the importance of diet, exercise, and its role in overall health and mortality. The patient's FH and SH was reviewed, including their home life, tobacco status, and drug and alcohol status.    COVID x 3 Consider shingrix, uptodate with PNA vaccine Last mammo 06/2022, due for repeat  Sister with breast cancer, she is not interest in repeating. Colonoscopy: colonoscopy nml 2008  Dr. Geryl. Repeat was due in 5 years.  Since then saw Dr. Perleman, [facility]. 2009 sigmoid nml! Brother  with colon cancer.  No further indicated  PAP/DVE: TAH, not indicated DXA: 11/2015 osteopenia, repeat in 5 years.. due   Problem List Items Addressed This Visit     Alzheimer disease (HCC)    Chronic, stable control per pt and husband.  Doing well overall.  Aricept  5 mg po at bedtime.  Followed by Neuro in past.        Breast pain, right   Refer for diagnostic mammogram and ultrasound      Relevant Orders   US  LIMITED ULTRASOUND INCLUDING AXILLA RIGHT BREAST   US  LIMITED ULTRASOUND INCLUDING AXILLA LEFT BREAST    MM 3D DIAGNOSTIC MAMMOGRAM BILATERAL BREAST   Essential hypertension   Stable, chronic.  Continue current medication.  Amlodipine  5 mg daily      Hypercholesteremia   Chronic, tolerable control for age.       MDD (major depressive disorder), single episode, mild   Chronic, stable control   Remeron  7.5 mg daily at bedtime  Venlafaxine  37.5 mg in AM.      Osteopenia   Chronic, overdue for bone density.  Encouraged patient to supplement vitamin D .  Continue calcium  in diet.  Continue as much regular activity/exercise as tolerated.      Relevant Orders   DG Bone Density   Other Visit Diagnoses       Routine general medical examination at a health care facility    -  Primary          Greig Ring, MD

## 2024-04-01 NOTE — Assessment & Plan Note (Signed)
 Chronic, overdue for bone density.  Encouraged patient to supplement vitamin D .  Continue calcium  in diet.  Continue as much regular activity/exercise as tolerated.

## 2024-04-01 NOTE — Assessment & Plan Note (Signed)
 Chronic, stable control per pt and husband.  Doing well overall.  Aricept  5 mg po at bedtime.  Followed by Neuro in past.

## 2024-04-01 NOTE — Patient Instructions (Addendum)
 Consider adding vit D 1000  units  daily  Please call the location of your choice from the menu below to schedule your Bone Density appointment.    [facility]   Phone:  [phone]   [address]                                                                            [place], [place] [address]                                            Services: 3D Mammogram and Bone Density

## 2024-04-01 NOTE — Assessment & Plan Note (Signed)
Refer for diagnostic mammogram and ultrasound

## 2024-04-01 NOTE — Assessment & Plan Note (Signed)
Chronic, tolerable control for age.

## 2024-04-10 ENCOUNTER — Ambulatory Visit
Admission: RE | Admit: 2024-04-10 | Discharge: 2024-04-10 | Disposition: A | Discharge status: Home / Self Care | Source: Ambulatory Visit | Attending: Family Medicine | Admitting: Family Medicine

## 2024-04-10 ENCOUNTER — Ambulatory Visit: Payer: Self-pay | Admitting: Family Medicine

## 2024-04-10 DIAGNOSIS — N644 Mastodynia: Secondary | ICD-10-CM | POA: Insufficient documentation

## 2024-04-10 DIAGNOSIS — R92333 Mammographic heterogeneous density, bilateral breasts: Secondary | ICD-10-CM | POA: Diagnosis not present

## 2024-05-02 ENCOUNTER — Other Ambulatory Visit: Payer: Self-pay | Admitting: Family Medicine

## 2024-06-09 ENCOUNTER — Telehealth: Payer: Self-pay | Admitting: *Deleted

## 2024-06-09 NOTE — Telephone Encounter (Addendum)
 I do not see that Jessica Hardin in on patient's DPR.  I did call her and let her know she was not on the DPR but I could take down her questions and relay them to Dr. Avelina.   Wants to know Dr. Sherrel thoughts on a supplement called NeruoQ.  She heard is works well and fast. 2.  She thinks her mom is getting worse.  She forgets who Mr. Drenning is and called the police on him.  She states it's mostly at night   when it's  time to go to bed.  She does not want him to get in the bed with her.    She states she would like to talk to Dr. Avelina about these things and if needed we could call her dad and he could set up conference call so she can be in on the conversation.    [name] is aware that Dr. Avelina is out of the office this week.

## 2024-06-09 NOTE — Telephone Encounter (Signed)
 Copied from CRM #[id number]. Topic: Clinical - Medication Question >> Jun 09, 2024 10:42 AM Franky GRADE wrote: Reason for CRM: Patient's daughter [name] Ka is calling to ask a couple of questions regarding a nutrition supplement for the patient.

## 2024-06-17 NOTE — Telephone Encounter (Signed)
 Appointment scheduled 07/05/2023 at 3:20 pm with Dr. Avelina to discuss memory concerns.  Jessica Hardin and Glade (daughter) will be present at appointment.

## 2024-06-17 NOTE — Telephone Encounter (Signed)
 If pt worse.. needs to make an appt with pt and daughter in person to discuss... she does see a neurologist co can also follow up there .  Make 40 min appt.

## 2024-06-29 ENCOUNTER — Emergency Department
Admission: EM | Admit: 2024-06-29 | Discharge: 2024-06-29 | Disposition: A | Discharge status: Home / Self Care | Attending: Emergency Medicine | Admitting: Emergency Medicine

## 2024-06-29 ENCOUNTER — Other Ambulatory Visit: Payer: Self-pay

## 2024-06-29 DIAGNOSIS — Z79899 Other long term (current) drug therapy: Secondary | ICD-10-CM | POA: Insufficient documentation

## 2024-06-29 DIAGNOSIS — I10 Essential (primary) hypertension: Secondary | ICD-10-CM | POA: Diagnosis not present

## 2024-06-29 DIAGNOSIS — F0280 Dementia in other diseases classified elsewhere without behavioral disturbance: Secondary | ICD-10-CM | POA: Diagnosis not present

## 2024-06-29 DIAGNOSIS — B029 Zoster without complications: Secondary | ICD-10-CM | POA: Diagnosis not present

## 2024-06-29 DIAGNOSIS — G309 Alzheimer's disease, unspecified: Secondary | ICD-10-CM | POA: Insufficient documentation

## 2024-06-29 DIAGNOSIS — B028 Zoster with other complications: Secondary | ICD-10-CM

## 2024-06-29 DIAGNOSIS — J45909 Unspecified asthma, uncomplicated: Secondary | ICD-10-CM | POA: Diagnosis not present

## 2024-06-29 DIAGNOSIS — R21 Rash and other nonspecific skin eruption: Secondary | ICD-10-CM

## 2024-06-29 DIAGNOSIS — R5383 Other fatigue: Secondary | ICD-10-CM

## 2024-06-29 LAB — BASIC METABOLIC PANEL WITH GFR
Anion gap: 8 (ref 5–15)
BUN: 19 mg/dL (ref 8–23)
CO2: 27 mmol/L (ref 22–32)
Calcium: 9.7 mg/dL (ref 8.9–10.3)
Chloride: 101 mmol/L (ref 98–111)
Creatinine, Ser: 1.12 mg/dL — ABNORMAL HIGH (ref 0.44–1.00)
GFR, Estimated: 48 mL/min — ABNORMAL LOW
Glucose, Bld: 135 mg/dL — ABNORMAL HIGH (ref 70–99)
Potassium: 3.9 mmol/L (ref 3.5–5.1)
Sodium: 136 mmol/L (ref 135–145)

## 2024-06-29 LAB — CBC
HCT: 44.2 % (ref 36.0–46.0)
Hemoglobin: 14 g/dL (ref 12.0–15.0)
MCH: 30.9 pg (ref 26.0–34.0)
MCHC: 31.7 g/dL (ref 30.0–36.0)
MCV: 97.6 fL (ref 80.0–100.0)
Platelets: 216 K/uL (ref 150–400)
RBC: 4.53 MIL/uL (ref 3.87–5.11)
RDW: 13.2 % (ref 11.5–15.5)
WBC: 3.7 K/uL — ABNORMAL LOW (ref 4.0–10.5)
nRBC: 0 % (ref 0.0–0.2)

## 2024-06-29 MED ORDER — VALACYCLOVIR HCL 500 MG PO TABS
1000.0000 mg | ORAL_TABLET | Freq: Once | ORAL | Status: AC
  Administered 2024-06-29: 1000 mg via ORAL
  Filled 2024-06-29: qty 2

## 2024-06-29 MED ORDER — VALACYCLOVIR HCL 1 G PO TABS: 1000.0000 mg | ORAL_TABLET | Freq: Three times a day (TID) | ORAL | 0 refills | Status: AC

## 2024-06-29 NOTE — ED Triage Notes (Addendum)
 Pt comes with c/o fatigue and shaking. Pt also has rash with blisters noted to chest and back area. Pt states no itching but it is uncomfortable. Family reports rash sometime last week.

## 2024-06-29 NOTE — Discharge Instructions (Signed)
 Please take the valacyclovir  as prescribed for your shingles.  Please see your primary care doctor this week to get reassessed.

## 2024-06-29 NOTE — ED Provider Notes (Signed)
 Jessica Hardin Provider Note    Event Date/Time   First MD Initiated Contact with Patient 06/29/24 1323     (approximate)   History   Rash   HPI  Jessica Hardin is a 84 y.o. female with history of dementia, anxiety, asthma, GERD, IBS, presenting with a rash to her upper chest and back.  States that started about a week ago but they are noticing new lesions popping up, initially started on the back and now is right upper chest.  Denies pain, states it is slightly itchy.  Husband states that she has history of allergies and was wondering if she is allergic to something.  She denies any chest pain or shortness of breath.  No nausea vomiting or diarrhea.  No intraoral lesions.  States that she was started on Aricept  for her Alzheimer's.  No other new medications.  Denies new exposures.  Has been states that he noticed some chills but no fever.  States that she has been a bit more fatigued lately.  Independent history obtained from husband as above.  Husband denies any other sick contacts.  No recent travel.  On independent chart review, she was seen by primary care in October, history of right breast pain, history of Alzheimer's on Aricept , history of hypertension on amlodipine , also has been on Remeron  for MDD.     Physical Exam   Triage Vital Signs: ED Triage Vitals  Encounter Vitals Group     BP 06/29/24 1212 127/76     Girls Systolic BP Percentile --      Girls Diastolic BP Percentile --      Boys Systolic BP Percentile --      Boys Diastolic BP Percentile --      Pulse Rate 06/29/24 1212 100     Resp 06/29/24 1212 19     Temp 06/29/24 1212 99.1 F (37.3 C)     Temp src --      SpO2 06/29/24 1212 100 %     Weight --      Height --      Head Circumference --      Peak Flow --      Pain Score 06/29/24 1211 6     Pain Loc --      Pain Education --      Exclude from Growth Chart --     Most recent vital signs: Vitals:   06/29/24 1212  BP:  127/76  Pulse: 100  Resp: 19  Temp: 99.1 F (37.3 C)  SpO2: 100%     General: Awake, no distress.  CV:  Good peripheral perfusion.  Resp:  Normal effort.  Abd:  No distention.  Other:  No intraoral lesions, vesicular rash to the right upper chest, right upper back that does not cross the midline, no Nikolsky sign.   ED Results / Procedures / Treatments   Labs (all labs ordered are listed, but only abnormal results are displayed) Labs Reviewed  CBC - Abnormal; Notable for the following components:      Result Value   WBC 3.7 (*)    All other components within normal limits  BASIC METABOLIC PANEL WITH GFR - Abnormal; Notable for the following components:   Glucose, Bld 135 (*)    Creatinine, Ser 1.12 (*)    GFR, Estimated 48 (*)    All other components within normal limits        PROCEDURES:  Critical Care performed: No  Procedures   MEDICATIONS ORDERED IN ED: Medications  valACYclovir  (VALTREX ) tablet 1,000 mg (has no administration in time range)     IMPRESSION / MDM / ASSESSMENT AND PLAN / ED COURSE  I reviewed the triage vital signs and the nursing notes.                              Differential diagnosis includes, but is not limited to, zoster, no Nikolsky sign, patient is nontoxic-appearing, no intraoral lesions to suggest SJS.  Zoster rash to her right upper chest/upper back does not appear to cross the midline, it does appear to be in contiguous dermatomes.  Patient has been denies history of immunosuppression or history of immunological issues.  She also does not appear to be on any immunotherapy or medications that will cause immunocompromise at this time.  Labs were obtained out of triage.  Will start her with valacyclovir .  And have her follow-up with primary care this week for reassessment.  Patient's presentation is most consistent with acute presentation with potential threat to life or bodily function.  Independent interpretation of labs below.   Will start her on antiviral therapy given the reports of new lesions.  First dose given in the emergency department.  Otherwise considered but no indication for inpatient admission at this time, she safe for outpatient management.  Will discharge with strict return precautions and instructions to follow-up with primary care this week.    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  Electrolytes not severely deranged. [TT]    Clinical Course User Index [TT] Waymond, Lorelle Cummins, MD     FINAL CLINICAL IMPRESSION(S) / ED DIAGNOSES   Final diagnoses:  Herpes zoster with complication  Rash  Fatigue, unspecified type     Rx / DC Orders   ED Discharge Orders     None        Note:  This document was prepared using Dragon voice recognition software and may include unintentional dictation errors.    Waymond Lorelle Cummins, MD 06/29/24 1352

## 2024-07-01 ENCOUNTER — Telehealth: Payer: Self-pay | Admitting: *Deleted

## 2024-07-01 NOTE — Telephone Encounter (Signed)
 Copied from CRM [id number]. Topic: Clinical - Medication Question >> Jul 01, 2024  1:13 PM Chasity T wrote: Reason for CRM: Alm husband of patient is calling with questions regarding medication valACYclovir  (VALTREX ) 1000 MG tablet for wife. She was tested positive for shingles. Please call back at [phone]. He doesn't know how often to give it to her because it does not say on bottle.

## 2024-07-01 NOTE — Telephone Encounter (Signed)
 Spoke with Jessica Hardin and let him know that the Valtrex  should be given three times a day for 7 days.  Jessica Hardin wanted specific times to give the medication.  I recommended that he give it at breakfast, lunch and dinner.

## 2024-07-04 ENCOUNTER — Encounter: Payer: Self-pay | Admitting: Family Medicine

## 2024-07-04 ENCOUNTER — Ambulatory Visit: Admitting: Family Medicine

## 2024-07-04 VITALS — BP 120/70 | HR 77 | Temp 97.6°F | Ht <= 58 in | Wt 134.0 lb

## 2024-07-04 DIAGNOSIS — G309 Alzheimer's disease, unspecified: Secondary | ICD-10-CM

## 2024-07-04 DIAGNOSIS — B029 Zoster without complications: Secondary | ICD-10-CM

## 2024-07-04 DIAGNOSIS — F0280 Dementia in other diseases classified elsewhere without behavioral disturbance: Secondary | ICD-10-CM

## 2024-07-04 MED ORDER — DONEPEZIL HCL 10 MG PO TABS: 10.0000 mg | ORAL_TABLET | Freq: Every day | ORAL | 3 refills | Status: AC

## 2024-07-04 NOTE — Assessment & Plan Note (Signed)
 Acute, very characteristic rash although minimal pain complaints from patient.  Resolving with valacyclovir .  Recommended completion of course. Use Tylenol Extra Strength 650 mg 2 tablets up to 3 times daily as needed for pain.

## 2024-07-04 NOTE — Patient Instructions (Signed)
"   Please update DPR form for daughter and  get her info on setting up MyChart. "

## 2024-07-04 NOTE — Assessment & Plan Note (Signed)
 Chronic, per daughter and husband this is continuing to progress despite Aricept  5 mg daily.  Will increased Aricept  to 10 mg daily.  Follow-up in 2 months for repeat memory test.  Also encouraged daughter to set up healthcare power of attorney for both her parents and to set up MyChart communication so she can reach me if there are issues. Recommended return to neurology if continued concerns for possible additional recommendations.

## 2024-07-14 ENCOUNTER — Other Ambulatory Visit: Payer: Self-pay | Admitting: Family Medicine

## 2024-07-17 ENCOUNTER — Telehealth: Payer: Self-pay

## 2024-07-17 DIAGNOSIS — F0280 Dementia in other diseases classified elsewhere without behavioral disturbance: Secondary | ICD-10-CM

## 2024-07-17 NOTE — Telephone Encounter (Signed)
 Copied from CRM #[id number]. Topic: Clinical - Medical Advice >> Jul 17, 2024  4:05 PM Hadassah PARAS wrote: Reason for CRM: Pt has Alzheimer disease and was advised by insurance to contact PCP office to seek home health visits to assist wife while husband runs errands or take care of home things. Please advise pt on #[phone]

## 2024-07-18 NOTE — Telephone Encounter (Signed)
 Spoke with Jessica Hardin.  He is asking for a [facility] referral that can help out with wife so he can get out the of the house to run errands.  He would like to try and get a HH skilled nursing referral because insurance doesn't cover an aide.  Wonder if we can do a medication management referral since she has Alzheimers?

## 2024-07-18 NOTE — Addendum Note (Signed)
 Addended by: AVELINA NO E on: 07/18/2024 10:23 AM   Modules accepted: Orders

## 2024-07-22 ENCOUNTER — Telehealth: Payer: Self-pay | Admitting: *Deleted

## 2024-07-22 MED ORDER — ONDANSETRON 4 MG PO TBDP: 4.0000 mg | ORAL_TABLET | Freq: Three times a day (TID) | ORAL | 0 refills | Status: AC | PRN

## 2024-07-22 NOTE — Telephone Encounter (Signed)
 Tried to return Jessica Hardin's phone call but the number provided is a non working number.

## 2024-07-22 NOTE — Telephone Encounter (Signed)
 Copied from CRM #[id number]. Topic: Clinical - Medical Advice >> Jul 22, 2024  1:28 PM Laymon HERO wrote: Reason for CRM: [facility] # [phone]- Patient had a Shingle outbreak-- completed medication- still having itching- using over the counter for that. Also patient threw up in bathroom this morning- wanting to know if more nausea medication can be called in.

## 2024-07-22 NOTE — Telephone Encounter (Signed)
 Community message sent to [employer] with [employer] giving verbal orders for Skilled Nursing.

## 2024-07-22 NOTE — Telephone Encounter (Signed)
 Phone number provided for [facility] nurse is a non working number.

## 2024-07-22 NOTE — Telephone Encounter (Signed)
 Nausea medication refilled as requested.

## 2024-07-22 NOTE — Telephone Encounter (Signed)
 Copied from CRM [id number]. Topic: Clinical - Home Health Verbal Orders >> Jul 22, 2024  1:22 PM Brittany M wrote: Caller/Agency: [facility] Callback Number: [phone] Service Requested: Skilled Nursing Frequency: 1 x 4  Any new concerns about the patient? No

## 2024-07-23 ENCOUNTER — Telehealth: Payer: Self-pay

## 2024-07-23 ENCOUNTER — Other Ambulatory Visit (HOSPITAL_COMMUNITY): Payer: Self-pay

## 2024-07-23 ENCOUNTER — Telehealth: Payer: Self-pay | Admitting: Family Medicine

## 2024-07-23 NOTE — Telephone Encounter (Signed)
 Pharmacy Patient Advocate Encounter   Received notification from [facility] KEY that prior authorization for Ondansetron  ODT 4 is required/requested.   Insurance verification completed.   The patient is insured through [facility] ADVANTAGE/RX ADVANCE.   Per test claim: PA required; PA submitted to above mentioned insurance via Latent Key/confirmation #/EOC [facility] Status is pending

## 2024-07-23 NOTE — Telephone Encounter (Signed)
 Copied from CRM [id number]. Topic: General - Other >> Jul 22, 2024  4:38 PM Joesph B wrote: Reason for CRM: patients husband calling to speak to donna.

## 2024-07-23 NOTE — Telephone Encounter (Signed)
 Left message for Jessica Hardin to return call to office.

## 2024-07-23 NOTE — Telephone Encounter (Signed)
 Spoke with Mr. Spranger.  He just called to say thank you for the referral for [facility].  They have already came out to the house.  Nothing further is needed at this time.

## 2024-07-24 ENCOUNTER — Other Ambulatory Visit (HOSPITAL_COMMUNITY): Payer: Self-pay

## 2024-07-24 ENCOUNTER — Telehealth: Payer: Self-pay

## 2024-07-24 NOTE — Telephone Encounter (Signed)
 PA request has been Archived. New Encounter has been or will be created for follow up. For additional info see Pharmacy Prior Auth telephone encounter from 07/24/24.

## 2024-07-24 NOTE — Telephone Encounter (Signed)
 Pharmacy Patient Advocate Encounter   Received notification from [facility] KEY that prior authorization for Ondansetron  4 ODT is required/requested.   Insurance verification completed.   The patient is insured through [facility] ADVANTAGE/RX ADVANCE.   Per test claim: PA required; PA submitted to above mentioned insurance via Latent Key/confirmation #/[id number] Status is pending

## 2024-09-02 ENCOUNTER — Ambulatory Visit: Admitting: Family Medicine

## 2024-12-18 ENCOUNTER — Ambulatory Visit
